# Patient Record
Sex: Female | Born: 1960 | Race: White | Hispanic: No | Smoking: Former smoker
Health system: Southern US, Community
[De-identification: ages and names within clinical notes are randomized; demographics above are authoritative.]

## PROBLEM LIST (undated history)

## (undated) DIAGNOSIS — Z9889 Other specified postprocedural states: Secondary | ICD-10-CM

## (undated) DIAGNOSIS — M51379 Other intervertebral disc degeneration, lumbosacral region without mention of lumbar back pain or lower extremity pain: Secondary | ICD-10-CM

## (undated) DIAGNOSIS — F329 Major depressive disorder, single episode, unspecified: Secondary | ICD-10-CM

## (undated) DIAGNOSIS — N3281 Overactive bladder: Secondary | ICD-10-CM

## (undated) DIAGNOSIS — F32A Depression, unspecified: Secondary | ICD-10-CM

## (undated) DIAGNOSIS — M109 Gout, unspecified: Secondary | ICD-10-CM

## (undated) DIAGNOSIS — F319 Bipolar disorder, unspecified: Secondary | ICD-10-CM

## (undated) DIAGNOSIS — R87619 Unspecified abnormal cytological findings in specimens from cervix uteri: Secondary | ICD-10-CM

## (undated) DIAGNOSIS — T7840XA Allergy, unspecified, initial encounter: Secondary | ICD-10-CM

## (undated) DIAGNOSIS — E785 Hyperlipidemia, unspecified: Secondary | ICD-10-CM

## (undated) DIAGNOSIS — H269 Unspecified cataract: Secondary | ICD-10-CM

## (undated) DIAGNOSIS — R112 Nausea with vomiting, unspecified: Secondary | ICD-10-CM

## (undated) DIAGNOSIS — M5137 Other intervertebral disc degeneration, lumbosacral region: Secondary | ICD-10-CM

## (undated) DIAGNOSIS — T8859XA Other complications of anesthesia, initial encounter: Secondary | ICD-10-CM

## (undated) DIAGNOSIS — I10 Essential (primary) hypertension: Secondary | ICD-10-CM

## (undated) DIAGNOSIS — F419 Anxiety disorder, unspecified: Secondary | ICD-10-CM

## (undated) DIAGNOSIS — K219 Gastro-esophageal reflux disease without esophagitis: Secondary | ICD-10-CM

## (undated) DIAGNOSIS — N951 Menopausal and female climacteric states: Secondary | ICD-10-CM

## (undated) HISTORY — DX: Hyperlipidemia, unspecified: E78.5

## (undated) HISTORY — DX: Major depressive disorder, single episode, unspecified: F32.9

## (undated) HISTORY — DX: Allergy, unspecified, initial encounter: T78.40XA

## (undated) HISTORY — DX: Unspecified abnormal cytological findings in specimens from cervix uteri: R87.619

## (undated) HISTORY — DX: Unspecified cataract: H26.9

## (undated) HISTORY — DX: Other intervertebral disc degeneration, lumbosacral region without mention of lumbar back pain or lower extremity pain: M51.379

## (undated) HISTORY — PX: CHOLECYSTECTOMY: SHX55

## (undated) HISTORY — PX: TUBAL LIGATION: SHX77

## (undated) HISTORY — DX: Anxiety disorder, unspecified: F41.9

## (undated) HISTORY — DX: Other intervertebral disc degeneration, lumbosacral region: M51.37

## (undated) HISTORY — PX: CARPAL TUNNEL RELEASE: SHX101

## (undated) HISTORY — DX: Depression, unspecified: F32.A

## (undated) HISTORY — PX: EYE SURGERY: SHX253

## (undated) HISTORY — PX: DILATION AND CURETTAGE OF UTERUS: SHX78

## (undated) HISTORY — PX: TONSILLECTOMY: SUR1361

## (undated) HISTORY — DX: Overactive bladder: N32.81

## (undated) HISTORY — DX: Gout, unspecified: M10.9

---

## 1983-01-25 HISTORY — PX: CERVIX LESION DESTRUCTION: SHX591

## 2007-06-08 LAB — CBC WITH AUTOMATED DIFF
ABS. BASOPHILS: 0 10*3/uL (ref 0.0–0.1)
ABS. EOSINOPHILS: 0.2 10*3/uL (ref 0.0–0.4)
ABS. LYMPHOCYTES: 3.2 10*3/uL (ref 0.8–3.5)
ABS. MONOCYTES: 0.5 10*3/uL (ref 0–1.0)
ABS. NEUTROPHILS: 4.2 10*3/uL (ref 1.8–8.0)
BASOPHILS: 0 % (ref 0–1)
EOSINOPHILS: 3 % (ref 0–7)
HCT: 39 % (ref 35.0–47.0)
HGB: 13.1 g/dL (ref 11.5–16.0)
LYMPHOCYTES: 39 % (ref 12–49)
MCH: 29.4 PG (ref 26.0–34.0)
MCHC: 33.6 g/dL (ref 30.0–35.0)
MCV: 87.4 FL (ref 80.0–99.0)
MONOCYTES: 6 % (ref 5–13)
NEUTROPHILS: 52 % (ref 32–75)
PLATELET: 273 10*3/uL (ref 150–400)
RBC: 4.46 M/uL (ref 3.80–5.20)
RDW: 12.5 % (ref 11.5–14.5)
WBC: 8.1 10*3/uL (ref 3.6–11.0)

## 2007-06-08 LAB — METABOLIC PANEL, BASIC
Anion gap: 9 mmol/L (ref 5–15)
BUN/Creatinine ratio: 9 — ABNORMAL LOW (ref 12–20)
BUN: 9 MG/DL (ref 6–20)
CO2: 29 MMOL/L (ref 21–32)
Calcium: 8.9 MG/DL (ref 8.5–10.1)
Chloride: 103 MMOL/L (ref 97–108)
Creatinine: 1 MG/DL (ref 0.6–1.3)
GFR est AA: >60 mL/min/{1.73_m2} (ref >60)
GFR est non-AA: >60 mL/min/{1.73_m2} (ref >60)
Glucose: 89 MG/DL (ref 50–100)
Potassium: 4.6 MMOL/L (ref 3.5–5.1)
Sodium: 141 MMOL/L (ref 136–145)

## 2008-05-13 ENCOUNTER — Ambulatory Visit

## 2008-05-13 NOTE — Progress Notes (Signed)
Labs drawn

## 2008-05-14 LAB — METABOLIC PANEL, COMPREHENSIVE
A-G Ratio: 1 — ABNORMAL LOW (ref 1.1–2.2)
ALT (SGPT): 22 U/L (ref 12–78)
AST (SGOT): 11 U/L — ABNORMAL LOW (ref 15–37)
Albumin: 3.6 g/dL (ref 3.5–5.0)
Alk. phosphatase: 68 U/L (ref 50–136)
Anion gap: 8 mmol/L (ref 5–15)
BUN/Creatinine ratio: 16 (ref 12–20)
BUN: 11 MG/DL (ref 6–20)
Bilirubin, total: 0.3 MG/DL (ref 0.2–1.0)
CO2: 27 MMOL/L (ref 21–32)
Calcium: 8.3 MG/DL — ABNORMAL LOW (ref 8.5–10.1)
Chloride: 103 MMOL/L (ref 97–108)
Creatinine: 0.7 MG/DL (ref 0.6–1.3)
GFR est AA: >60 mL/min/{1.73_m2} (ref >60)
GFR est non-AA: >60 mL/min/{1.73_m2} (ref >60)
Globulin: 3.6 g/dL (ref 2.0–4.0)
Glucose: 77 MG/DL (ref 65–100)
Potassium: 4.4 MMOL/L (ref 3.5–5.1)
Protein, total: 7.2 g/dL (ref 6.4–8.2)
Sodium: 138 MMOL/L (ref 136–145)

## 2008-05-14 LAB — CBC WITH AUTOMATED DIFF
ABS. BASOPHILS: 0 10*3/uL (ref 0.0–0.1)
ABS. EOSINOPHILS: 0.2 10*3/uL (ref 0.0–0.4)
ABS. LYMPHOCYTES: 2.9 10*3/uL (ref 0.8–3.5)
ABS. MONOCYTES: 0.8 10*3/uL (ref 0.0–1.0)
ABS. NEUTROPHILS: 7.4 10*3/uL (ref 1.8–8.0)
BASOPHILS: 0 % (ref 0–1)
EOSINOPHILS: 2 % (ref 0–7)
HCT: 36.7 % (ref 35.0–47.0)
HGB: 12.5 g/dL (ref 11.5–16.0)
LYMPHOCYTES: 26 % (ref 12–49)
MCH: 30.1 PG (ref 26.0–34.0)
MCHC: 34.1 g/dL (ref 30.0–36.5)
MCV: 88.4 FL (ref 80.0–99.0)
MONOCYTES: 7 % (ref 5–13)
NEUTROPHILS: 65 % (ref 32–75)
PLATELET: 265 10*3/uL (ref 150–400)
RBC: 4.15 M/uL (ref 3.80–5.20)
RDW: 12.8 % (ref 11.5–14.5)
WBC: 11.4 10*3/uL — ABNORMAL HIGH (ref 3.6–11.0)

## 2008-05-14 LAB — LIPID PANEL
CHOL/HDL Ratio: 3.5 (ref 0–5.0)
Cholesterol, total: 187 MG/DL (ref <200)
HDL Cholesterol: 53 MG/DL
LDL, calculated: 86.2 MG/DL (ref 0–100)
Triglyceride: 239 MG/DL
VLDL, calculated: 47.8 MG/DL

## 2008-05-14 LAB — FOLLICLE STIMULATING HORMONE: FSH: 2.9 m[IU]/mL

## 2008-05-14 LAB — TSH 3RD GENERATION: TSH: 1.4 u[IU]/mL (ref 0.36–3.74)

## 2008-05-15 LAB — ESTRADIOL: ESTRADIOL: 155 pg/mL

## 2008-05-16 LAB — ESTROGENS, FRACTIONATED
ESTRADIOL: 170 pg/mL
ESTRONE: 41.1 pg/mL
TOTAL ESTROGENS: 211.1 pg/mL

## 2008-05-27 MED ORDER — CYCLOBENZAPRINE 10 MG TAB
10 mg | ORAL_TABLET | Freq: Two times a day (BID) | ORAL | Status: AC
Start: 2008-05-27 — End: 2008-06-06

## 2008-05-27 MED ORDER — HYDROCODONE 5 MG-ACETAMINOPHEN 500 MG CAPSULE
5-500 mg | ORAL_TABLET | Freq: Four times a day (QID) | ORAL | Status: AC
Start: 2008-05-27 — End: 2008-06-01

## 2008-05-27 NOTE — Progress Notes (Signed)
HISTORY OF PRESENT ILLNESS  Pamela Harrison is a 48 y.o. female.  HPI  Larey Seat off ladder, injuring right posterior thorax 5 days ago. No sob or fever. Hurts to cough. Stopped smoking because of thorax pain. No Crack heard, and pain came delayed, but still need to R/O rib fracture. Large bruise.      Review of Systems   Constitutional: Negative.    HENT: Negative.    Eyes: Negative.    Cardiovascular: Negative.    Gastrointestinal: Negative.    Genitourinary: Negative.    Musculoskeletal: Positive for myalgias and back pain.   Skin: Negative.    Neurological: Negative.    Psychiatric/Behavioral: Negative.        Physical Exam   Constitutional: She is oriented. She appears well-developed and well-nourished.   HENT:   Head: Normocephalic and atraumatic.   Eyes: Conjunctivae and extraocular motions are normal. Pupils are equal, round, and reactive to light.   Neck: Normal range of motion. Neck supple. No tracheal deviation present. No thyromegaly present.   Cardiovascular: Normal rate, regular rhythm, normal heart sounds and intact distal pulses.  Exam reveals no gallop and no friction rub.    No murmur heard.  Pulmonary/Chest: No respiratory distress. She has no wheezes. She has no rales. She exhibits tenderness.        Large bruise right posterior thorax about T7. No palpable FX. or rub heard   Abdominal: Bowel sounds are normal.   Neurological: She is alert and oriented. She has normal reflexes.   Skin: Skin is warm.            ASSESSMENT and PLAN  Constancia was seen today for back pain and spasms.    Diagnoses and associated orders for this visit:    Superf injury trunk nec  - HYDROCODONE-ACETAMINOPHEN 5 MG-500 MG CAP; Take 1 Tab by mouth four (4) times daily for 5 days.  - cyclobenzaprine (FLEXERIL) tablet; Take 1 Tab by mouth two (2) times a day for 10 days.  - MAM CHEST PA AND LATERAL; Future    Other Orders  - TRAZODONE 150 MG TAB; Take 150 mg by mouth nightly.        Follow-up Disposition:   Return if symptoms worsen or fail to improve.  Warned regarding pulmonary infections.

## 2008-06-30 MED ORDER — FAMOTIDINE 20 MG TAB
20 mg | ORAL_TABLET | Freq: Two times a day (BID) | ORAL | Status: DC
Start: 2008-06-30 — End: 2012-08-07

## 2008-06-30 NOTE — Progress Notes (Signed)
HISTORY OF PRESENT ILLNESS  Pamela Harrison is a 48 y.o. female.  HPI  Hypertension:  The patient reports:  taking medications as instructed, no medication side effects noted, no TIA's, no chest pain on exertion, no dyspnea on exertion, no swelling of ankles, no orthostatic dizziness or lightheadedness, no orthopnea or paroxysmal nocturnal dyspnea.     Lab Results   Component Value Date/Time   ??? Sodium 138 05/13/2008  3:36 PM   ??? Potassium 4.4 05/13/2008  3:36 PM   ??? Chloride 103 05/13/2008  3:36 PM   ??? CO2 27 05/13/2008  3:36 PM   ??? Anion gap 8 05/13/2008  3:36 PM   ??? Glucose 77 05/13/2008  3:36 PM   ??? BUN 11 05/13/2008  3:36 PM   ??? Creatinine 0.7 05/13/2008  3:36 PM   ??? BUN/Creatinine ratio 16 05/13/2008  3:36 PM   ??? GFR est non-AA >60 05/13/2008  3:36 PM   ??? Calcium 8.3 05/13/2008  3:36 PM   ??? GFR est AA >60 05/13/2008  3:36 PM       GERD: Needs rf of Famotidine which controls her symptoms.    Bipolar disorder: Needs labs for high risk medication monitoring.      Review of Systems   Constitutional: Negative.    HENT: Negative.    Eyes: Negative.    Respiratory: Negative.    Cardiovascular: Negative.    Gastrointestinal: Negative.    Genitourinary: Negative.    Skin: Negative.    Neurological: Negative.    Endo/Heme/Allergies: Negative.    Psychiatric/Behavioral: Negative.        Physical Exam   Nursing note and vitals reviewed.  Constitutional: She is oriented. She appears well-developed and well-nourished. No distress.   HENT:   Head: Normocephalic and atraumatic.   Right Ear: External ear normal.   Left Ear: External ear normal.   Eyes: Conjunctivae are normal. Pupils are equal, round, and reactive to light. No scleral icterus.   Neck: Normal range of motion. Neck supple. No JVD present. No tracheal deviation present. No thyromegaly present.        No carotid bruit.   Cardiovascular: Normal rate, regular rhythm and normal heart sounds.  Exam reveals no gallop and no friction rub.    No murmur heard.   Pulmonary/Chest: Effort normal and breath sounds normal. She has no wheezes. She has no rales.   Abdominal: Soft. Bowel sounds are normal. She exhibits no distension. No tenderness. She has no rebound and no guarding.   Musculoskeletal: She exhibits no edema.   Neurological: She is alert and oriented.   Skin: Skin is warm and dry. No rash noted. She is not diaphoretic.   Psychiatric: She has a normal mood and affect. Her behavior is normal. Judgment and thought content normal.       ASSESSMENT and PLAN  Pamela Harrison was seen today for follow-up.    Diagnoses and associated orders for this visit:    Hypertension  - METABOLIC PANEL, COMPREHENSIVE; Future    Gerd (gastroesophageal reflux disease)  - CBC WITH AUTOMATED DIFF; Future  - famotidine (PEPCID) tablet; Take 1 Tab by mouth two (2) times a day.    Bipolar depression  - CBC WITH AUTOMATED DIFF; Future  - METABOLIC PANEL, COMPREHENSIVE; Future  - CARBAMAZEPINE; Future        Follow-up Disposition:  Return in about 3 months (around 09/30/2008).

## 2008-07-01 ENCOUNTER — Ambulatory Visit

## 2008-07-01 LAB — CARBAMAZEPINE: Carbamazepine: 5.7 ug/mL (ref 4.0–12.0)

## 2008-07-01 LAB — CBC WITH AUTOMATED DIFF
ABS. BASOPHILS: 0 10*3/uL (ref 0.0–0.1)
ABS. EOSINOPHILS: 0.2 10*3/uL (ref 0.0–0.4)
ABS. LYMPHOCYTES: 2.8 10*3/uL (ref 0.8–3.5)
ABS. MONOCYTES: 0.7 10*3/uL (ref 0.0–1.0)
ABS. NEUTROPHILS: 8.2 10*3/uL — ABNORMAL HIGH (ref 1.8–8.0)
BASOPHILS: 0 % (ref 0–1)
EOSINOPHILS: 2 % (ref 0–7)
HCT: 34.4 % — ABNORMAL LOW (ref 35.0–47.0)
HGB: 11.4 g/dL — ABNORMAL LOW (ref 11.5–16.0)
LYMPHOCYTES: 23 % (ref 12–49)
MCH: 29.8 PG (ref 26.0–34.0)
MCHC: 33.1 g/dL (ref 30.0–36.5)
MCV: 89.8 FL (ref 80.0–99.0)
MONOCYTES: 6 % (ref 5–13)
NEUTROPHILS: 69 % (ref 32–75)
PLATELET: 333 10*3/uL (ref 150–400)
RBC: 3.83 M/uL (ref 3.80–5.20)
RDW: 13.1 % (ref 11.5–14.5)
WBC: 11.8 10*3/uL — ABNORMAL HIGH (ref 3.6–11.0)

## 2008-07-01 LAB — METABOLIC PANEL, COMPREHENSIVE
A-G Ratio: 1 — ABNORMAL LOW (ref 1.1–2.2)
ALT (SGPT): 15 U/L (ref 12–78)
AST (SGOT): 9 U/L — ABNORMAL LOW (ref 15–37)
Albumin: 3.8 g/dL (ref 3.5–5.0)
Alk. phosphatase: 119 U/L (ref 50–136)
Anion gap: 6 mmol/L (ref 5–15)
BUN/Creatinine ratio: 14 (ref 12–20)
BUN: 11 MG/DL (ref 6–20)
Bilirubin, total: 0.1 MG/DL — ABNORMAL LOW (ref 0.2–1.0)
CO2: 29 MMOL/L (ref 21–32)
Calcium: 8.7 MG/DL (ref 8.5–10.1)
Chloride: 101 MMOL/L (ref 97–108)
Creatinine: 0.8 mg/dL (ref 0.6–1.3)
GFR est AA: >60 mL/min/{1.73_m2} (ref >60)
GFR est non-AA: >60 mL/min/{1.73_m2} (ref >60)
Globulin: 3.7 g/dL (ref 2.0–4.0)
Glucose: 78 MG/DL (ref 65–100)
Potassium: 5.1 MMOL/L (ref 3.5–5.1)
Protein, total: 7.5 g/dL (ref 6.4–8.2)
Sodium: 136 MMOL/L (ref 136–145)

## 2008-07-02 LAB — FERRITIN: Ferritin: 6 NG/ML — ABNORMAL LOW (ref 8–252)

## 2008-10-17 MED ORDER — PROMETHAZINE-CODEINE 6.25 MG-10 MG/5 ML SYRUP
Freq: Four times a day (QID) | ORAL | Status: DC | PRN
Start: 2008-10-17 — End: 2012-08-02

## 2008-10-17 NOTE — Patient Instructions (Signed)
English   Spanish  Upper Respiratory Infection in Adults: After Your Visit  Your Care Instructions    An upper respiratory infection, also called a URI, is an infection of the nose, sinuses, or throat. Viruses or bacteria can cause URIs. Colds, the flu, and sinusitis are examples of URIs. These infections are spread by coughs, sneezes, and close contact.  You may need antibiotics to treat bacterial infections. Antibiotics do not help viral infections. But you can treat most infections with home care. This may include drinking lots of fluids and taking over-the-counter pain medicine. You will probably feel better in 4 to 10 days.  Follow-up care is a key part of your treatment and safety. Be sure to make and go to all appointments, and call your doctor if you are having problems. It???s also a good idea to know your test results and keep a list of the medicines you take.  How can you care for yourself at home?  ?? Drink plenty of fluids to prevent dehydration. Stick to water and other caffeine-free clear liquids until you feel better. If you have kidney, heart, or liver disease and have to limit fluids, talk with your doctor before you increase the amount of fluids you drink.   ?? Take an over-the-counter pain medicine, such as acetaminophen (Tylenol), ibuprofen (Advil, Motrin), or naproxen (Aleve). Read and follow all instructions on the label.   ?? If your doctor prescribed antibiotics, take them as directed. Do not stop taking them just because you feel better. You need to take the full course of antibiotics.   ?? Ask your doctor about cough medicines and decongestants. Some doctors recommend these medicines, while others feel that they do not help. If you use an over-the-counter medicine, treat a symptom with a medicine for only that problem. For example, if you have a cough, use a cough medicine???not a medicine for cough, stuffy nose, and headache.    ?? Be careful when taking over-the-counter cold or flu medicines and Tylenol at the same time. Many of these medicines have acetaminophen, which is Tylenol. Read the labels to make sure that you are not taking more than the recommended dose. Too much acetaminophen (Tylenol) can be harmful.   ?? Get plenty of rest.   ?? Use saline (saltwater) nasal washes to help keep your nasal passages open and wash out mucus and bacteria. You can buy saline nose drops at a grocery store or drugstore. Or you can make your own at home by mixing ?? teaspoon salt, 1 cup water (at room temperature) and ?? teaspoon baking soda. If you make your own, fill a bulb syringe with the solution, insert the tip into your nostril, and squeeze gently. Blow your nose.   ?? Use a vaporizer or humidifier to add moisture to your bedroom. Follow the instructions for cleaning the machine.   ?? Do not smoke. Avoid secondhand smoke. If you need help quitting, talk to your doctor about stop-smoking programs and medicines. These can increase your chances of quitting for good.  When should you call for help?  Call 911 anytime you think you may need emergency care. For example, call if:  ?? You have severe trouble breathing.   ?? You have rapid swelling of the throat or tongue.  Call your doctor now or seek immediate medical care if:  ?? You have a fever with a stiff neck or a severe headache.   ?? You have signs of needing more fluids. You have sunken   eyes, a dry mouth, and pass only a little dark urine.   ?? You cannot keep down fluids or medicine.  Watch closely for changes in your health, and be sure to contact your doctor if:  ?? You have a deep cough and a lot of mucus.   ?? You are too tired to eat or drink.   ?? You have a new symptom, such as a sore throat, an earache, or a rash.   ?? You do not get better as expected.     Where can you learn more?   Go to http://www.healthwise.net/[facility].   Enter K520 in the search box to learn more about "Upper Respiratory Infection in Adults: After Your Visit."   ?? 2006-2010 Healthwise[employer]. Care instructions adapted under license by [employer] (which disclaims liability or warranty for this information). This care instruction is for use with your licensed healthcare professional. If you have questions about a medical condition or this instruction, always ask your healthcare professional. Healthwise disclaims any warranty or liability for your use of this information.

## 2008-10-17 NOTE — Progress Notes (Signed)
Subjective:   Pamela Harrison is a 48 y.o. female who complains of congestion, productive cough and chills for 7 days, gradually worsening since that time.  She denies a history of shortness of breath and wheezing.    Evaluation to date: none.   Treatment to date: OTC products.  Patient does smoke cigarettes.  Relevant PMH: No pertinent additional PMH.    Problem List Date Reviewed: 10/17/2008      Class Noted    Hypertension [401.9AJ]  Unknown        Anemia [285.9AA]  Unknown    Overview     by history          Bipolar depression [296.50H] (Chronic)  06/29/2008              Current outpatient prescriptions   Medication Sig Dispense Refill   ??? ferrous sulfate 325 mg (65 mg Iron) tablet Take  by mouth daily (before breakfast).       ??? famotidine (PEPCID) 20 mg tablet Take 1 Tab by mouth two (2) times a day.  60 Tab  11   ??? trazodone (DESYREL) 150 mg tablet Take 150 mg by mouth nightly.       ??? citalopram (CELEXA) 20 mg tablet Take 20 mg by mouth daily.       ??? potassium 99 mg Tab Take 99 mg by mouth daily.       ??? hydrOXYzine (ATARAX) 25 mg tablet Take 25 mg by mouth as needed.       ??? lisinopril-hydrochlorothiazide (PRINZIDE, ZESTORETIC) 10-12.5 mg per tablet Take 1 Tab by mouth daily.       ??? carbamazepine (EPITOL) 200 mg tablet Take 200 mg by mouth two (2) times a day.           Allergies   Allergen Reactions   ??? Sulfa (Sulfonamide Antibiotics) Nausea and Vomiting       Past Medical History   Diagnosis Date   ??? Hypertension    ??? Menopause    ??? BIPOLAR DISORDER    ??? Anemia      by history   ??? Depression    ??? Bipolar depression 06/29/2008       Past Surgical History   Procedure Date   ??? Hx cholecystectomy 1988   ??? Hx tubal ligation 1995   ??? Hx carpal tunnel release 2000     bilateral       Family History   Problem Relation Age of Onset   ??? Hypertension Father    ??? Hypertension Paternal Grandmother        History   Substance Use Topics   ??? Tobacco Use:    ??? Alcohol Use:           Review of Systems   Pertinent items are noted in HPI.    Objective:     BP 103/68   Pulse 73   Temp(Src) 97.4 ??F (36.3 ??C) (Oral)   Resp 20   Ht 5\' 6"  (1.676 m)   Wt 164 lb (74.39 kg)  General:  alert, cooperative, no distress   Eyes: conjunctivae/corneas clear. PERRL, EOM's intact. Fundi benign   Ears: normal TM's and external ear canals AU   Sinuses: Normal paranasal sinuses without tenderness   Mouth:  Lips, mucosa, and tongue normal. Teeth and gums normal   Neck: supple, symmetrical, trachea midline and no adenopathy.   Heart: S1 and S2 normal, no murmurs noted.    Lungs: clear to auscultation  bilaterally   Abdomen: abnormal findings:  hyperactive bowel sounds        Assessment/Plan:   viral upper respiratory illness  Suggested symptomatic OTC remedies.  RTC prn.  Discussed diagnosis and treatment of viral URIs.  Discussed the importance of avoiding unnecessary antibiotic therapy.

## 2008-11-18 MED ORDER — KETOROLAC TROMETHAMINE 30 MG/ML INJECTION
30 mg/mL (1 mL) | Freq: Once | INTRAMUSCULAR | Status: AC
Start: 2008-11-18 — End: 2008-11-18

## 2008-11-18 MED ORDER — KETOROLAC TROMETHAMINE 10 MG TAB
10 mg | ORAL_TABLET | ORAL | Status: AC | PRN
Start: 2008-11-18 — End: 2008-11-23

## 2008-11-18 NOTE — Progress Notes (Signed)
Subjective  Pamela Harrison is a 48 y.o. female who presents [id number] for the past 3 days.  Frontal, top of head and more on the right side than the left. Intermittent pain worsens and remits but has not completely gone away for the past 3 days.  Photophopia and noise make it worse.  + occasional nausea and congestion.  Tried tylenol, no help.  Pt had similar in the past.   No change in vision or smell, no dizziness or stumbling.  No LOC, confusion or weakness.    Hypertension - well controlled on lisinpril/hctz.      Allergies   Allergen Reactions   ??? Sulfa (Sulfonamide Antibiotics) Nausea and Vomiting       Medications::has a current medication list which includes ferrous sulfate, famotidine, trazodone, citalopram, potassium, hydroxyzine, lisinopril-hydrochlorothiazide, carbamazepine.  Past Medical History:has a past medical history of Hypertension; Menopause; BIPOLAR DISORDER; Anemia; Depression; and Bipolar depression (06/29/2008).  Past Surgical History: has past surgical history that includes cholecystectomy (1988); tubal ligation (1995); and carpal tunnel release (2000).  Social History: reports that she has been using tobacco.  She reports that she does not currently drink alcohol.    ROS   General: negative for - chills, fatigue, fever, weight change  Psych: negative for - anxiety, depression, irritability or mood swings  ENT: negative for - +headaches, hearing change, nasal congestion, oral lesions, +sneezing or sore throat  Heme/ Lymph: negative for - bleeding problems, bruising, pallor or swollen lymph nodes  Endo: negative for - hot flashes, polydipsia/polyuria or temperature intolerance  Resp: negative for - cough, shortness of breath or wheezing  CV: negative for - chest pain, edema or palpitations  GI: negative for - abdominal pain, change in bowel habits, constipation, diarrhea or nausea/vomiting  GU: negative for - dysuria, hematuria, incontinence, pelvic pain or vulvar/vaginal symptoms   MSK: negative for - joint pain, joint swelling or muscle pain  Neuro: negative for - confusion, headaches, seizures or weakness  Derm: negative for - dry skin, hair changes, rash or skin lesion changes      Physical Exam  BP 122/84   Pulse 72   Temp(Src) 97.6 ??F (36.4 ??C) (Oral)   Resp 16   Ht 5\' 7"  (1.702 m)   Wt 168 lb (76.204 kg)   LMP 11/04/2008  General appearance: alert, cooperative, no distress, appears stated age  Head: Normocephalic, without obvious abnormality, atraumatic  Eyes: conjunctivae/corneas clear. PERRL, EOM's intact.   Ears: normal TM's and external ear canals AU  Nose: Nares normal. Septum midline. Mucosa normal. No drainage or sinus tenderness.  Throat: Lips, mucosa, and tongue normal. Teeth and gums normal  Neck: supple, symmetrical, trachea midline, no adenopathy, thyroid: not enlarged, symmetric, no tenderness/mass/nodules, no carotid bruit and no JVD  Back: symmetric, no curvature. ROM normal. No CVA tenderness.  Lungs: clear to auscultation bilaterally, no wheezes, no increased work of breathing  Heart: regular rate and rhythm, S1, S2 normal, no murmur, click, rub or gallop  Extremities: extremities normal, atraumatic, no cyanosis or edema  Pulses: 2+ and symmetric  Skin: Skin color, texture, turgor normal. No rashes or lesions  Lymph nodes: Cervical, supraclavicular, and axillary nodes normal.  Neurologic: Alert and oriented X 3, normal strength and tone. Normal symmetric reflexes. Normal coordination and gait; CN 2-12 grossly intact      Assessment/Plan  1. Headache (784.0)  KETOROLAC TROMETHAMINE INJ, THER/PROPH/DIAG INJECTION, SUBCUT/IM     likely migraine -  Improved with 30mg  toradol injection in  the office  10mg  toradol script given  If patient is not completely better by 2pm this afternoon, she will call us back and we will order ct scan at that time.

## 2008-11-18 NOTE — Progress Notes (Signed)
Pamela Harrison is a 48 y.o. female who had a chief complaint of Headache.

## 2008-12-02 NOTE — Progress Notes (Addendum)
Addended by: Raj Janus on: 12/02/2008      Modules accepted: Orders

## 2008-12-02 NOTE — Progress Notes (Signed)
Pamela Harrison is a 48 y.o. female who had no chief complaint listed for this encounter.

## 2008-12-02 NOTE — Progress Notes (Signed)
Pamela Harrison is a 48 y.o. female  Presenting for her annual checkup    LMP:11/04/08  Menses:regular monthly  [id number] - SVD  Last pelvic/PAP: doesn't know - many years  Last mammogram: hasn't had one  Last screening colonoscopy: hasn't had one  Pt requesting STD testing    Current outpatient prescriptions   Medication Sig Dispense Refill   ??? zolpidem (AMBIEN) 10 mg tablet Take  by mouth nightly as needed.       ??? ferrous sulfate 325 mg (65 mg Iron) tablet Take  by mouth daily (before breakfast).       ??? famotidine (PEPCID) 20 mg tablet Take 1 Tab by mouth two (2) times a day.  60 Tab  11   ??? citalopram (CELEXA) 20 mg tablet Take 20 mg by mouth daily.       ??? potassium 99 mg Tab Take 99 mg by mouth daily.       ??? hydrOXYzine (ATARAX) 25 mg tablet Take 25 mg by mouth as needed.       ??? lisinopril-hydrochlorothiazide (PRINZIDE, ZESTORETIC) 10-12.5 mg per tablet Take 1 Tab by mouth daily.       ??? carbamazepine (EPITOL) 200 mg tablet Take 200 mg by mouth two (2) times a day.       ??? promethazine-codeine (PHENERGAN-CODEINE) 6.25-10 mg/5 mL syrup Take 5-10 mL by mouth four (4) times daily as needed for Cough.  200 mL  1   ??? trazodone (DESYREL) 150 mg tablet Take 150 mg by mouth nightly.           Allergies: Sulfa (sulfonamide antibiotics)   History   Social History   ??? Marital Status: Divorced     Spouse Name: N/A     Number of Children: N/A   ??? Years of Education: N/A   Occupational History   ??? Not on file.   Social History Main Topics   ??? Tobacco Use: Yes   ??? Alcohol Use: No   ??? Drug Use:    ??? Sexually Active:    Other Topics Concern   ??? Not on file   Social History Narrative   ??? No narrative on file       Family History   Problem Relation Age of Onset   ??? Hypertension Father    ??? Hypertension Paternal Grandmother        Past Medical History   Diagnosis Date   ??? Hypertension    ??? Menopause    ??? BIPOLAR DISORDER    ??? Anemia      by history   ??? Depression    ??? Bipolar depression 06/29/2008          Review of Systems - History obtained from the patient  Gen: negative for weight loss, fever, night sweats  HEENT: negative for hearing loss, earache, congestion, snoring, sorethroat  CV: negative for chest pain, palpitations, edema  Resp: negative for cough, shortness of breath, wheezing  GI: negative for change in bowel habits, abdominal pain, black or bloody stools  GU: negative for frequency, dysuria, hematuria, vaginal discharge  MSK: negative for back pain, joint pain, muscle pain  Breast: negative for breast lumps, nipple discharge, galactorrhea  Skin :negative for itching, rash, hives  Neuro: negative for dizziness, headache, confusion, weakness  Psych: negative for anxiety, depression, change in mood  Heme/lymph: negative for bleeding, bruising, pallor    Physical Exam    BP 118/80   Pulse 82   Temp(Src) 98.1 ??  F (36.7 ??C) (Oral)   Resp 16   Ht 5\' 7"  (1.702 m)   Wt 166 lb (75.297 kg)   LMP 11/04/2008  General appearance: alert, cooperative, no distress, appears stated age  Head: Normocephalic, without obvious abnormality, atraumatic, sinuses nontender to percussion  Eyes: conjunctivae/corneas clear. PERRL, EOM's intact.   Ears: normal TM's and external ear canals AU  Nose: Nares normal. Septum midline. Mucosa normal. No drainage or sinus tenderness.  Throat: Lips, mucosa, and tongue normal. Teeth and gums normal  Neck: supple, symmetrical, trachea midline, no adenopathy, thyroid: not enlarged, symmetric, no tenderness/mass/nodules, no carotid bruit and no JVD  Back: symmetric, no curvature. ROM normal. No CVA tenderness.  Lungs: clear to auscultation bilaterally, no wheezes, no increased work of breathing  Heart: regular rate and rhythm, S1, S2 normal, no murmur, click, rub or gallop  Abdomen: soft, non-tender. Bowel sounds normal. No masses,  no organomegaly  Extremities: extremities normal, atraumatic, no cyanosis or edema  Pulses: 2+ and symmetric   Skin: Skin color, texture, turgor normal. No rashes or lesions  Lymph nodes: Cervical, supraclavicular, and axillary nodes normal.  Neurologic: Alert and oriented X 3, normal strength and tone. Normal symmetric reflexes. Normal coordination and gait  Pelvic: ext genital nl without rashes or lesions, pink and moist vaginal mucosa, scant white discharge, cervix without lesions or abnormal discharge, uterus non tender and normal size, no adnexal masses or tenderness, clitoral piercing  Breast: non tender, no masses or tenderness, no nipple discharge, nipple peircing bilaterally      Assessment and Plan:    1. Well woman - physical / health maintenance visit   Pap smear today   Counseled re: diet, exercise, healthy lifestyle   Return for yearly wellness visits   Mammogram ordered   Referral to gi for colonoscopy  2.   STD screening - GC/Chl swab, HIV, RPR, Hep B, Hep C, HSV 1 and 2  F/u for results  HTN - will check flp, cmp

## 2008-12-03 LAB — LIPID PANEL
CHOL/HDL Ratio: 4.3 (ref 0–5.0)
Cholesterol, total: 210 MG/DL — ABNORMAL HIGH (ref <200)
HDL Cholesterol: 49 MG/DL
LDL, calculated: 132.8 MG/DL — ABNORMAL HIGH (ref 0–100)
Triglyceride: 141 MG/DL (ref <150)
VLDL, calculated: 28.2 MG/DL

## 2008-12-03 LAB — METABOLIC PANEL, COMPREHENSIVE
A-G Ratio: 1.2 (ref 1.1–2.2)
ALT (SGPT): 34 U/L (ref 12–78)
AST (SGOT): 18 U/L (ref 15–37)
Albumin: 4.2 g/dL (ref 3.5–5.0)
Alk. phosphatase: 73 U/L (ref 50–136)
Anion gap: 13 mmol/L (ref 5–15)
BUN/Creatinine ratio: 23 — ABNORMAL HIGH (ref 12–20)
BUN: 16 MG/DL (ref 6–20)
Bilirubin, total: 0.3 MG/DL (ref 0.2–1.0)
CO2: 26 MMOL/L (ref 21–32)
Calcium: 8.7 MG/DL (ref 8.5–10.1)
Chloride: 95 MMOL/L — ABNORMAL LOW (ref 97–108)
Creatinine: 0.7 MG/DL (ref 0.6–1.3)
GFR est AA: >60 mL/min/{1.73_m2} (ref >60)
GFR est non-AA: >60 mL/min/{1.73_m2} (ref >60)
Globulin: 3.6 g/dL (ref 2.0–4.0)
Glucose: 88 MG/DL (ref 65–100)
Potassium: 4.3 MMOL/L (ref 3.5–5.1)
Protein, total: 7.8 g/dL (ref 6.4–8.2)
Sodium: 134 MMOL/L — ABNORMAL LOW (ref 136–145)

## 2008-12-03 LAB — CARBAMAZEPINE: Carbamazepine: 9.9 ug/mL (ref 4.0–12.0)

## 2008-12-03 LAB — RPR
RPR: NONREACTIVE
RPR: NONREACTIVE

## 2008-12-03 NOTE — Telephone Encounter (Signed)
Info faxed to [facility] [phone] to make appt with pt office ph [phone]

## 2008-12-04 LAB — HEPATITIS C AB
Hep C virus Ab Interp.: NEGATIVE
Hepatitis C virus Ab: <0.02 Index

## 2008-12-04 LAB — HIV 1/2 AB SCREEN W RFLX CONFIRM
HIV 1/2 Interpretation: NONREACTIVE
HIV1/2 INTERPRETATION, HHIVI: NONREACTIVE

## 2008-12-04 LAB — HEP B SURFACE AG: Hep B surface Ag: NEGATIVE

## 2008-12-04 LAB — HEPATITIS C ANTIBODY
HCV Ab: <0.02 Index
Hepatitis C Ab: NEGATIVE

## 2008-12-05 LAB — HSV TYPE 2-SPECIFIC ABS, IGG W/REFL SUPPLEMENTAL TESTING: HSV-2 Glycoprotein, IgG: 8.5 IV

## 2008-12-05 LAB — CHLAMYDIA/GC PCR
Chlamydia amplified: NEGATIVE
N. gonorrhea, amplified: NEGATIVE

## 2008-12-05 LAB — HSV-1 AB, IGG GLYCOPROTEIN, G-SPECIFIC: HSV-1 Glycoprotein, IgG: 5.38 IV

## 2008-12-09 NOTE — Telephone Encounter (Signed)
Message copied by Serita Butcher on Tue Dec 09, 2008 12:50 PM  ------       Message from: Raj Janus       Created: Mon Dec 08, 2008  8:22 AM         Pt needs appt to discuss results       ----- Message -----          From: [facility]          Sent: 12/03/2008  12:04 PM            To: Bernita Buffy, MD

## 2008-12-09 NOTE — Progress Notes (Signed)
Subjective  Pamela Harrison is a 48 y.o. female who presents for:f/u of lab results  Hyperlipidemia - Pt not on medication.  No chest pain, no sob, no muscle aches.  Working on low cholesterol, low fat diet.    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  She denies any genital lesions, has in the past had oral cold sores.    Allergies   Allergen Reactions   ??? Sulfa (Sulfonamide Antibiotics) Nausea and Vomiting       Medications::has a current medication list which includes zolpidem, ferrous sulfate, famotidine, potassium, hydroxyzine, lisinopril-hydrochlorothiazide, carbamazepine, promethazine-codeine, trazodone, and citalopram.  Past Medical History:has a past medical history of Hypertension; Menopause; BIPOLAR DISORDER; Anemia; Depression; and Bipolar depression (06/29/2008).  Past Surgical History: has past surgical history that includes cholecystectomy (1988); tubal ligation (1995); and carpal tunnel release (2000).  Social History: reports that she has been using tobacco.  She reports that she does not currently drink alcohol.    ROS   General: negative for - chills, fatigue, fever, weight change  Psych: negative for - anxiety, depression, irritability or mood swings  ENT: negative for - headaches, hearing change, nasal congestion, oral lesions, sneezing or sore throat  Heme/ Lymph: negative for - bleeding problems, bruising, pallor or swollen lymph nodes  Endo: negative for - hot flashes, polydipsia/polyuria or temperature intolerance  Resp: negative for - cough, shortness of breath or wheezing  CV: negative for - chest pain, edema or palpitations  GI: negative for - abdominal pain, change in bowel habits, constipation, diarrhea or nausea/vomiting  GU: negative for - dysuria, hematuria, incontinence, pelvic pain or vulvar/vaginal symptoms  MSK: negative for - joint pain, joint swelling or muscle pain   Neuro: negative for - confusion, headaches, seizures or weakness  Derm: negative for - dry skin, hair changes, rash or skin lesion changes      Physical Exam  BP 130/82   Pulse 69   Temp(Src) 97.5 ??F (36.4 ??C) (Oral)   Resp 16   Wt 174 lb (78.926 kg)   LMP 11/04/2008  General appearance: alert, cooperative, no distress, appears stated age  Head: Normocephalic, without obvious abnormality, atraumatic  Eyes: conjunctivae/corneas clear. PERRL, EOM's intact.   Lungs: clear to auscultation bilaterally, no wheezes, no increased work of breathing  Heart: regular rate and rhythm, S1, S2 normal, no murmur, click, rub or gallop  Extremities: extremities normal, atraumatic, no cyanosis or edema  Pulses: 2+ and symmetric  Skin: Skin color, texture, turgor normal. No rashes or lesions  Lymph nodes: Cervical, supraclavicular, and axillary nodes normal.  Neurologic: Alert and oriented X 3, normal strength and tone.Normal coordination and gait      Assessment/Plan  1. 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

## 2008-12-09 NOTE — Telephone Encounter (Signed)
Left message informing patient to make appointment to discuss lab results.

## 2008-12-09 NOTE — Progress Notes (Signed)
Pamela Harrison is a 48 y.o. female who had a chief complaint of Results.

## 2008-12-10 NOTE — Telephone Encounter (Signed)
appt 12-19-08 at 12:30 sf med ct for mammogram scheduled by concierge

## 2008-12-23 MED ORDER — KETOROLAC TROMETHAMINE 30 MG/ML INJECTION
30 mg/mL (1 mL) | Freq: Once | INTRAMUSCULAR | Status: AC
Start: 2008-12-23 — End: 2008-12-23

## 2008-12-23 MED ORDER — SUMATRIPTAN 50 MG TAB
50 mg | ORAL_TABLET | Freq: Once | ORAL | Status: DC | PRN
Start: 2008-12-23 — End: 2012-08-07

## 2008-12-23 NOTE — Progress Notes (Signed)
Pamela Harrison is a 48 y.o. female who had a chief complaint of Headache.

## 2008-12-23 NOTE — Progress Notes (Signed)
Subjective  Pamela Harrison is a 48 y.o. female who presents QIO:NGEXBMWU for one week, constant with associated nausea.  Has not tried any medications like tylenol or motrin because she says they don't work and make her sick to her stomach.  Pt has never been on migraine medications.  Light and sound bother her.  Resting makes the headache better.  She gets these about once a month.    She is supposed to wear glasses, but isn't wearing them and knows she needs to go and have her vision checked.  She is under a lot of stress.  Hypertension - well controlled on lisinopril/hctz, but did not take her medicines this AM.  No headaches or dizziness.  Not checking BP at home.    Allergies   Allergen Reactions   ??? Sulfa (Sulfonamide Antibiotics) Nausea and Vomiting       Medications::has a current medication list which includes cholecalciferol (vitamin d3), zolpidem, ferrous sulfate, famotidine, potassium, hydroxyzine, lisinopril-hydrochlorothiazide, carbamazepine, promethazine-codeine, trazodone, and citalopram.  Past Medical History:has a past medical history of Hypertension; Menopause; BIPOLAR DISORDER; Anemia; Depression; and Bipolar depression (06/29/2008).  Past Surgical History: has past surgical history that includes cholecystectomy (1988); tubal ligation (1995); and carpal tunnel release (2000).  Social History: reports that she has been using tobacco.  She reports that she does not currently drink alcohol.    ROS   General: negative for - chills, fatigue, fever, weight change  Psych: negative for - anxiety, depression, irritability or mood swings  ENT: negative for -+ headaches, hearing change, nasal congestion, oral lesions, sneezing or sore throat  Heme/ Lymph: negative for - bleeding problems, bruising, pallor or swollen lymph nodes  Endo: negative for - hot flashes, polydipsia/polyuria or temperature intolerance  Resp: negative for - cough, shortness of breath or wheezing   CV: negative for - chest pain, edema or palpitations  GI: negative for - abdominal pain, change in bowel habits, constipation, diarrhea or nausea/vomiting  GU: negative for - dysuria, hematuria, incontinence, pelvic pain or vulvar/vaginal symptoms  MSK: negative for - joint pain, joint swelling or muscle pain  Neuro: negative for - confusion, headaches, seizures or weakness  Derm: negative for - dry skin, hair changes, rash or skin lesion changes      Physical Exam  BP 154/101   Pulse 79   Temp(Src) 99.3 ??F (37.4 ??C) (Oral)   Resp 18   Wt 174 lb (78.926 kg)  General appearance: alert, cooperative, no distress, appears stated age  Head: Normocephalic, without obvious abnormality, atraumatic  Eyes: conjunctivae/corneas clear. PERRL, EOM's intact.   Ears: normal TM's and external ear canals AU  Nose: Nares normal. Septum midline. Mucosa normal. No drainage or sinus tenderness.  Throat: Lips, mucosa, and tongue normal. Teeth and gums normal  Lungs: clear to auscultation bilaterally, no wheezes, no increased work of breathing  Heart: regular rate and rhythm, S1, S2 normal, no murmur, click, rub or gallop  Abdomen: soft, non-tender. Bowel sounds normal. No masses,  no organomegaly  Extremities: extremities normal, atraumatic, no cyanosis or edema  Pulses: 2+ and symmetric  Skin: Skin color, texture, turgor normal. No rashes or lesions  Lymph nodes: Cervical, supraclavicular, and axillary nodes normal.  Neurologic: Alert and oriented X 3, normal strength and tone. Normal symmetric reflexes. Normal coordination and gait      Assessment/Plan  1. Headache (784.0)  KETOROLAC TROMETHAMINE INJ, THER/PROPH/DIAG INJECTION, SUBCUT/IM   2. HTN (hypertension) (401.9AF)       Pt felt  much better before leaving the office after the toradol.  likely migraine headaches, will try imitrex when next occurs.  I advised her if she gets more than 1 a month she may need neuro eval.   She will take her bp meds and monitor her bp and let me know if majority are elevated.

## 2008-12-26 NOTE — Telephone Encounter (Signed)
Patient advised to go to ER per Dr. Charlie Pitter.  Patient was agreeable to this.

## 2008-12-26 NOTE — Telephone Encounter (Signed)
Message copied by Serita Butcher on Fri Dec 26, 2008  2:07 PM  ------       Message from: Raj Janus       Created: Fri Dec 26, 2008  1:53 PM       Regarding: RE: eley         i advised she needs to go to the ED, she needs imaging              ----- Message -----          From: Serita Butcher, LPN          Sent: 12/26/2008   1:33 PM            To: Bernita Buffy, MD       Subject: Annell Greening: eley                                                                 ----- Message -----          From: Guy Begin          Sent: 12/26/2008   1:01 PM            To: [facility] Nurse Pool       Subject: Lennette Bihari, Lura Em [phone] Case ID  Patientid  Status  Priority  Type  Assignedto  Created  Createdby  Description        view/update  [id number]  [id number]  CLOSED  1  [facility]_Nurse Message  pezell  12/26/2008  [email or web address]  [facility] 12/26/08 12:05pm tjohnson Dr.Eley dob:December 22, 2060 Patient would like to inform nurse that after recent appointment(12/23/08)she is not feeling any better. She is now having headaches and severe nausea. Patient may reached at [phone]. Alternate number is [phone] at 1:01 assigned to dr eley/pe

## 2009-03-19 NOTE — Progress Notes (Signed)
Subjective  Pamela Harrison is a 49 y.o. female who presents for:  Bipolar disorder - followed by [facility], on citalopram, hydroxyzine, carbamazepine and trazadone - needs Labs for [facility] [place]  Fax [phone]  Headaches - seen multiple times for these, recurrent - has had associated nausea with them.  Was sent to ed once, unclear if she went - needs referral   Allergies   Allergen Reactions   ??? Sulfa (Sulfonamide Antibiotics) Nausea and Vomiting       Medications::has a current medication list which includes cholecalciferol (vitamin d3), zolpidem, ferrous sulfate, famotidine, citalopram, potassium, hydroxyzine, lisinopril-hydrochlorothiazide, carbamazepine, sumatriptan, promethazine-codeine, and trazodone.  Past Medical History:has a past medical history of Hypertension; Menopause; BIPOLAR DISORDER; Anemia; Depression; and Bipolar depression (06/29/2008).  Past Surgical History: has past surgical history that includes cholecystectomy (1988); tubal ligation (1995); and carpal tunnel release (2000).  Social History: reports that she has been smoking.  She does not have any smokeless tobacco history on file.  She reports that she does not currently drink alcohol.    ROS   General: negative for - chills, fatigue, fever, weight change  Psych: negative for - anxiety, depression, irritability or mood swings  ENT: negative for - headaches, hearing change, nasal congestion, oral lesions, sneezing or sore throat  Heme/ Lymph: negative for - bleeding problems, bruising, pallor or swollen lymph nodes  Endo: negative for - hot flashes, polydipsia/polyuria or temperature intolerance  Resp: negative for - cough, shortness of breath or wheezing  CV: negative for - chest pain, edema or palpitations  GI: negative for - abdominal pain, change in bowel habits, constipation, diarrhea or nausea/vomiting  GU: negative for - dysuria, hematuria, incontinence, pelvic pain or vulvar/vaginal symptoms   MSK: negative for - joint pain, joint swelling or muscle pain  Neuro: negative for - confusion, +headaches, seizures or weakness  Derm: negative for - dry skin, hair changes, rash or skin lesion changes      Physical Exam  BP 130/88   Pulse 89   Temp(Src) 97.3 ??F (36.3 ??C) (Oral)   Resp 12   Wt 180 lb (81.647 kg)  General appearance: alert, cooperative, no distress, appears stated age  Head: Normocephalic, without obvious abnormality, atraumatic  Eyes: conjunctivae/corneas clear. PERRL, EOM's intact.   Ears: normal TM's and external ear canals AU  Nose: Nares normal. Septum midline. Mucosa normal. No drainage or sinus tenderness.  Throat: Lips, mucosa, and tongue normal. Teeth and gums normal  Neck: supple, symmetrical, trachea midline, no adenopathy, thyroid: not enlarged, symmetric, no tenderness/mass/nodules, no carotid bruit and no JVD  Back: symmetric, no curvature. ROM normal. No CVA tenderness.  Lungs: clear to auscultation bilaterally, no wheezes, no increased work of breathing  Heart: regular rate and rhythm, S1, S2 normal, no murmur, click, rub or gallop  Extremities: extremities normal, atraumatic, no cyanosis or edema  Pulses: 2+ and symmetric  Skin: Skin color, texture, turgor normal. No rashes or lesions  Lymph nodes: Cervical, supraclavicular, and axillary nodes normal.  Neurologic: Alert and oriented X 3, normal strength and tone. Normal symmetric reflexes. Normal coordination and gait    Assessment/Plan  1. Bipolar depression (296.50H)  CBC WITH AUTOMATED DIFF, HEPATIC FUNCTION PANEL, CARBAMAZEPINE   2. Headache (784.0)  REFERRAL TO NEUROLOGY   3. Encounter for long-term (current) use of medications (V58.69S)  CBC WITH AUTOMATED DIFF, HEPATIC FUNCTION PANEL, CARBAMAZEPINE

## 2009-03-19 NOTE — Progress Notes (Signed)
Pamela Harrison is a 49 y.o. female who had a chief complaint of Follow-up.

## 2009-03-20 LAB — CBC WITH AUTOMATED DIFF
ABS. BASOPHILS: 0 10*3/uL (ref 0.0–0.2)
ABS. EOSINOPHILS: 0.3 10*3/uL (ref 0.0–0.4)
ABS. IMM. GRANS.: 0 10*3/uL (ref 0.0–0.1)
ABS. LYMPHOCYTES: 2.7 10*3/uL (ref 0.7–4.5)
ABS. MONOCYTES: 0.5 10*3/uL (ref 0.1–1.0)
ABS. NEUTROPHILS: 5.4 10*3/uL (ref 1.8–7.8)
BASOPHILS: 0 % (ref 0–3)
EOSINOPHILS: 3 % (ref 0–7)
HCT: 38.8 % (ref 34.0–44.0)
HGB: 12.7 g/dL (ref 11.5–15.0)
IMMATURE GRANULOCYTES: 0 % (ref 0–1)
LYMPHOCYTES: 31 % (ref 14–46)
MCH: 30.2 pg (ref 27.0–34.0)
MCHC: 32.7 g/dL (ref 32.0–36.0)
MCV: 92 fL (ref 80–98)
MONOCYTES: 6 % (ref 4–13)
NEUTROPHILS: 60 % (ref 40–74)
PLATELET: 291 10*3/uL (ref 140–415)
RBC: 4.21 x10E6/uL (ref 3.80–5.10)
RDW: 13.1 % (ref 11.7–15.0)
WBC: 8.9 10*3/uL (ref 4.0–10.5)

## 2009-03-20 LAB — HEPATIC FUNCTION PANEL
ALT (SGPT): 18 IU/L (ref 0–40)
AST (SGOT): 11 IU/L (ref 0–40)
Albumin: 4.3 g/dL (ref 3.5–5.5)
Alk. phosphatase: 71 IU/L (ref 25–150)
Bilirubin, direct: 0.07 mg/dL (ref 0.00–0.40)
Bilirubin, total: 0.2 mg/dL (ref 0.0–1.2)
Protein, total: 6.9 g/dL (ref 6.0–8.5)

## 2009-03-20 LAB — CARBAMAZEPINE: Carbamazepine: <0.5 ug/mL — ABNORMAL LOW (ref 4.0–12.0)

## 2009-03-20 NOTE — Telephone Encounter (Signed)
Lab results faxed.

## 2009-03-20 NOTE — Telephone Encounter (Signed)
appt 03-08-11at 11:20 dr Bethena Roys neurology [phone]  [address]

## 2009-03-20 NOTE — Telephone Encounter (Signed)
Message copied by Serita Butcher on Fri Mar 20, 2009 11:44 AM  ------       Message from: Raj Janus       Created: Fri Mar 20, 2009  8:22 AM         i printed a copy of her labs, can you please fax to [facility] [place]       Fax [phone]              ----- Message -----          From: [facility] Results In [facility]          Sent: 03/20/2009   7:57 AM            To: Bernita Buffy, MD

## 2009-04-07 MED ORDER — LISINOPRIL-HYDROCHLOROTHIAZIDE 10 MG-12.5 MG TAB
ORAL_TABLET | Freq: Every day | ORAL | Status: DC
Start: 2009-04-07 — End: 2012-08-07

## 2009-04-07 NOTE — Telephone Encounter (Signed)
Received refill request from [facility].

## 2012-07-30 NOTE — Telephone Encounter (Signed)
Call transferred from [facility]. Patient advised that her BP is 187/115 and has been elevated for the last three days. She states her feet are burning and she also has an ache in her left arm. Patient advised to seek Emergent care at the nearest ER. Dr. Ernestine Conrad agrees. Patient verbalized understanding.

## 2012-07-30 NOTE — Telephone Encounter (Signed)
Pt called complaining of her blood pressure 187/115, transfer call to [place]. Thanks

## 2012-08-02 LAB — CBC WITH AUTOMATED DIFF
ABS. BASOPHILS: 0 10*3/uL (ref 0.0–0.1)
ABS. EOSINOPHILS: 0.2 10*3/uL (ref 0.0–0.4)
ABS. LYMPHOCYTES: 2.5 10*3/uL (ref 0.8–3.5)
ABS. MONOCYTES: 0.8 10*3/uL (ref 0.0–1.0)
ABS. NEUTROPHILS: 5.5 10*3/uL (ref 1.8–8.0)
BASOPHILS: 0 % (ref 0–1)
EOSINOPHILS: 2 % (ref 0–7)
HCT: 36.9 % (ref 35.0–47.0)
HGB: 12.8 g/dL (ref 11.5–16.0)
LYMPHOCYTES: 28 % (ref 12–49)
MCH: 29.8 PG (ref 26.0–34.0)
MCHC: 34.7 g/dL (ref 30.0–36.5)
MCV: 85.8 FL (ref 80.0–99.0)
MONOCYTES: 9 % (ref 5–13)
NEUTROPHILS: 61 % (ref 32–75)
PLATELET: 226 10*3/uL (ref 150–400)
RBC: 4.3 M/uL (ref 3.80–5.20)
RDW: 12.9 % (ref 11.5–14.5)
WBC: 9 10*3/uL (ref 3.6–11.0)

## 2012-08-02 LAB — POC CHEM8
Anion gap (POC): 17 mmol/L — ABNORMAL HIGH (ref 5–15)
BUN (POC): 11 MG/DL (ref 9–20)
CO2 (POC): 23 MMOL/L (ref 21–32)
Calcium, ionized (POC): 1.12 MMOL/L (ref 1.12–1.32)
Chloride (POC): 105 MMOL/L (ref 98–107)
Creatinine (POC): 0.7 MG/DL (ref 0.6–1.3)
GFRAA, POC: >60 mL/min/{1.73_m2} (ref >60)
GFRNA, POC: >60 mL/min/{1.73_m2} (ref >60)
Glucose (POC): 88 MG/DL (ref 65–105)
Hematocrit (POC): 35 % (ref 35.0–47.0)
Hemoglobin (POC): 11.9 GM/DL (ref 11.5–16.0)
Potassium (POC): 3.7 MMOL/L (ref 3.5–5.1)
Sodium (POC): 140 MMOL/L (ref 136–145)

## 2012-08-02 LAB — POC TROPONIN-I: Troponin-I (POC): <0.04 ng/mL (ref 0.00–0.08)

## 2012-08-02 MED ORDER — SODIUM CHLORIDE 0.9 % IJ SYRG
INTRAMUSCULAR | Status: DC | PRN
Start: 2012-08-02 — End: 2012-08-02

## 2012-08-02 MED ORDER — ENALAPRILAT 1.25 MG/ML IV INJ
1.25 mg/mL | INTRAVENOUS | Status: AC
Start: 2012-08-02 — End: 2012-08-02
  Administered 2012-08-02: 13:00:00 via INTRAVENOUS

## 2012-08-02 MED ORDER — SODIUM CHLORIDE 0.9 % IJ SYRG
Freq: Three times a day (TID) | INTRAMUSCULAR | Status: DC
Start: 2012-08-02 — End: 2012-08-02
  Administered 2012-08-02: 13:00:00 via INTRAVENOUS

## 2012-08-02 MED ORDER — ASPIRIN 325 MG TAB
325 mg | ORAL | Status: AC
Start: 2012-08-02 — End: 2012-08-02
  Administered 2012-08-02: 13:00:00 via ORAL

## 2012-08-02 MED ORDER — LISINOPRIL 10 MG TAB
10 mg | ORAL_TABLET | Freq: Every day | ORAL | Status: AC
Start: 2012-08-02 — End: 2012-08-12

## 2012-08-02 MED FILL — ENALAPRILAT 1.25 MG/ML IV INJ: 1.25 mg/mL | INTRAVENOUS | Qty: 2

## 2012-08-02 NOTE — ED Notes (Signed)
Patient placed on O2 at 2lpm via nc.

## 2012-08-02 NOTE — ED Notes (Signed)
Pt reports she was checking her BP and it was high 186/117. Pt states she should be on BP meds but does not have insurance or a doctor. Pt also complains a headache, left hand tingling, bilateral aching arms and bilateral burning feet. Denies chest pain and SOB.

## 2012-08-02 NOTE — ED Notes (Signed)
Ambulatory to restroom.

## 2012-08-02 NOTE — ED Provider Notes (Signed)
Patient is a 52 y.o. female presenting with hypertension and headaches. The history is provided by the patient.   Hypertension   This is a recurrent (patient has been off meds for a long time and the past week has noted issues with her BP getting gradually worse and now with symptoms as in before.) problem. The current episode started more than 1 week ago. The problem has been gradually worsening. Associated symptoms include chest pain, anxiety, malaise/fatigue and headaches. Pertinent negatives include no palpitations, no neck pain, no peripheral edema, no dizziness, no shortness of breath, no nausea and no vomiting. Associated symptoms comments: Aches about both shoulders and her legs burn. Risk factors include smoking/tobacco exposure and hypertension.   Headache   Associated symptoms include malaise/fatigue. Pertinent negatives include no palpitations, no shortness of breath, no dizziness, no nausea and no vomiting.        Past Medical History   Diagnosis Date   ??? Hypertension    ??? Menopause    ??? BIPOLAR DISORDER    ??? Anemia      by history   ??? Depression    ??? Bipolar depression 06/29/2008        Past Surgical History   Procedure Laterality Date   ??? Hx cholecystectomy  1988   ??? Hx tubal ligation  1995   ??? Hx carpal tunnel release  2000     bilateral         Family History   Problem Relation Age of Onset   ??? Hypertension Father    ??? Hypertension Paternal Grandmother         History     Social History   ??? Marital Status: DIVORCED     Spouse Name: N/A     Number of Children: N/A   ??? Years of Education: N/A     Occupational History   ??? Not on file.     Social History Main Topics   ??? Smoking status: Current Every Day Smoker   ??? Smokeless tobacco: Not on file   ??? Alcohol Use: No   ??? Drug Use:    ??? Sexually Active:      Other Topics Concern   ??? Not on file     Social History Narrative   ??? No narrative on file                  ALLERGIES: Benadryl; Depakote; and Sulfa (sulfonamide antibiotics)      Review of Systems    Constitutional: Positive for malaise/fatigue. Negative for appetite change and unexpected weight change.   HENT: Negative for facial swelling, rhinorrhea, trouble swallowing, neck pain, dental problem and ear discharge.    Eyes: Negative for pain and discharge.   Respiratory: Negative for apnea, shortness of breath and stridor.    Cardiovascular: Positive for chest pain. Negative for palpitations and leg swelling.   Gastrointestinal: Negative for nausea, vomiting, abdominal pain, diarrhea, blood in stool and abdominal distention.   Genitourinary: Negative for dysuria, hematuria, flank pain and difficulty urinating.   Musculoskeletal: Positive for arthralgias. Negative for myalgias, back pain and joint swelling.   Skin: Negative for color change, rash and wound.   Neurological: Positive for headaches. Negative for dizziness, facial asymmetry, speech difficulty and numbness.   Hematological: Negative for adenopathy.   Psychiatric/Behavioral: Negative for suicidal ideas, hallucinations, behavioral problems, self-injury and agitation.       Filed Vitals:    08/02/12 0800   BP: 191/105   Pulse:  80   Temp: 98.3 ??F (36.8 ??C)   Resp: 16   Height: 5\' 6"  (1.676 m)   Weight: 91.264 kg (201 lb 3.2 oz)   SpO2: 95%            Physical Exam   Nursing note and vitals reviewed.  Constitutional: She is oriented to person, place, and time. She appears well-developed and well-nourished. She appears distressed.   anxious   HENT:   Head: Normocephalic and atraumatic.   Right Ear: External ear normal.   Left Ear: External ear normal.   Mouth/Throat: Oropharynx is clear and moist. No oropharyngeal exudate.   Eyes: Conjunctivae and EOM are normal. Pupils are equal, round, and reactive to light. Right eye exhibits no discharge. Left eye exhibits no discharge. No scleral icterus.   Neck: Normal range of motion. No tracheal deviation present. No thyromegaly present.   Cardiovascular: Normal rate, regular rhythm and normal heart sounds.    No  murmur heard.  Pulmonary/Chest: Effort normal and breath sounds normal. No respiratory distress. She has no wheezes. She has no rales. She exhibits no tenderness.   Abdominal: Soft. Bowel sounds are normal. She exhibits no distension. There is no tenderness. There is no rebound and no guarding.   Musculoskeletal: Normal range of motion. She exhibits no edema and no tenderness.   Lymphadenopathy:     She has no cervical adenopathy.   Neurological: She is alert and oriented to person, place, and time. No cranial nerve deficit. Coordination normal.   Skin: Skin is warm. No erythema.   Psychiatric: Her behavior is normal. Judgment and thought content normal.        MDM     Amount and/or Complexity of Data Reviewed:   Clinical lab tests:  Ordered and reviewed  Tests in the radiology section of CPT??:  Ordered and reviewed  Tests in the medicine section of the CPT??:  Ordered and reviewed   Review and summarize past medical records:  Yes   Independant visualization of image, tracing, or specimen:  Yes  Risk of Significant Complications, Morbidity, and/or Mortality:   Presenting problems:  Moderate  Diagnostic procedures:  Moderate  Management options:  Moderate  General Comments: Was on Lisinopril previously with good results. Will start by IV.  Will check labs and EKG.  Plan DC if stable with close follow up and start back on meds.  Also discussed smoking  0940- resting comfortably.  BP stable.  Will dc and start on lisinopril with closse follow up  Progress:   Patient progress:  Improved      Procedures    ED EKG interpretation:  Rhythm: normal sinus rhythm; and regular . Rate (approx.): 78; Axis: normal; P wave: normal; QRS interval: normal ; ST/T wave: normal; in  Lead: poor r wave progression; Other findings: abnormal ekg. This EKG was interpreted by Sunnie Nielsen. Mort Sawyers, DO,ED Provider.

## 2012-08-02 NOTE — ED Notes (Signed)
Discharge instructions reviewed with patient.  Patient verbalized understanding of instructions and agrees to follow-up care as recommended by provider.  Patient in no apparent distress and vital signs are within normal limits with reassessment of pain noted under 'vital signs' area of chart.   Patient ambulated out of ED accompanied by mother.

## 2012-08-03 LAB — EKG, 12 LEAD, INITIAL
Atrial Rate: 78 {beats}/min
Calculated P Axis: 26 degrees
Calculated R Axis: 6 degrees
Calculated T Axis: 40 degrees
Diagnosis: NORMAL
P-R Interval: 170 ms
Q-T Interval: 380 ms
QRS Duration: 84 ms
QTC Calculation (Bezet): 433 ms
Ventricular Rate: 78 {beats}/min

## 2012-08-07 MED ORDER — LISINOPRIL-HYDROCHLOROTHIAZIDE 10 MG-12.5 MG TAB
ORAL_TABLET | Freq: Every day | ORAL | Status: DC
Start: 2012-08-07 — End: 2016-12-29

## 2012-08-07 NOTE — Progress Notes (Signed)
Reviewed all necessary information and documentation with the patient

## 2012-08-07 NOTE — Patient Instructions (Signed)
Plantar Fasciitis: After Your Visit  Your Care Instructions     Plantar fasciitis is pain and inflammation of the plantar fascia, the tissue at the bottom of your foot that connects the heel bone to the toes. The plantar fascia also supports the arch. If you strain the plantar fascia, it can develop small tears and cause heel pain when you stand or walk.  Plantar fasciitis can be caused by running or other sports. It also may occur in people who are overweight or who have high arches or flat feet. You may get plantar fasciitis if you walk or stand for long periods, or have a tight Achilles tendon or calf muscles.  You can improve your foot pain with rest and other care at home. It might take a few weeks to a few months for your foot to heal completely.  Follow-up care is a key part of your treatment and safety. Be sure to make and go to all appointments, and call your doctor if you are having problems. It's also a good idea to know your test results and keep a list of the medicines you take.  How can you care for yourself at home?  ?? Rest your feet often. Reduce your activity to a level that lets you avoid pain. If possible, do not run or walk on hard surfaces.  ?? Take pain medicines exactly as directed.  ?? If the doctor gave you a prescription medicine for pain, take it as prescribed.  ?? If you are not taking a prescription pain medicine, take an over-the-counter anti-inflammatory medicine for pain and swelling, such as ibuprofen (Advil, Motrin) or naproxen (Aleve). Read and follow all instructions on the label.  ?? Use ice massage to help with pain and swelling. You can use an ice cube or an ice cup several times a day. To make an ice cup, fill a paper cup with water and freeze it. Cut off the top of the cup until a half-inch of ice shows. Hold onto the remaining paper to use the cup. Rub the ice in small circles over the area for 5 to 7 minutes.  ?? Contrast baths, which alternate hot and cold water, can also  help reduce swelling. But because heat alone may make pain and swelling worse, end a contrast bath with a soak in cold water.  ?? Wear a night splint if your doctor suggests it. A night splint holds your foot with the toes pointed up and the foot and ankle at a 90-degree angle. This position gives the bottom of your foot a constant, gentle stretch.  ?? Do simple exercises such as calf stretches and towel stretches 2 to 3 times each day, especially when you first get up in the morning. These can help the plantar fascia become more flexible. They also make the muscles that support your arch stronger. Hold these stretches for 15 to 30 seconds per stretch. Repeat 2 to 4 times.  ?? Stand about 1 foot from a wall. Place the palms of both hands against the wall at chest level. Lean forward against the wall, keeping one leg with the knee straight and heel on the ground while bending the knee of the other leg.  ?? Sit down on the floor or a mat with your feet stretched in front of you. Roll up a towel lengthwise, and loop it over the ball of your foot. Holding the towel at both ends, gently pull the towel toward you to stretch your foot.  ??   Wear shoes with good arch support. Athletic shoes or shoes with a well-cushioned sole are good choices.  ?? Try heel cups or shoe inserts (orthotics) to help cushion your heel. You can buy these at many shoe stores.  ?? Put on your shoes as soon as you get out of bed. Going barefoot or wearing slippers may make your pain worse.  ?? Reach and stay at a good weight for your height. This puts less strain on your feet.  When should you call for help?  Call your doctor now or seek immediate medical care if:  ?? You have heel pain with fever, redness, or warmth in your heel.  ?? You cannot put weight on the sore foot.  Watch closely for changes in your health, and be sure to contact your doctor if:  ?? You have numbness or tingling in your heel.  ?? Your heel pain lasts more than 2 weeks.   Where can you  learn more?   Go to http://www.healthwise.net/[facility]  Enter X351 in the search box to learn more about "Plantar Fasciitis: After Your Visit."   ?? 2006-2014 Healthwise, Incorporated. Care instructions adapted under license by [facility] (which disclaims liability or warranty for this information). This care instruction is for use with your licensed healthcare professional. If you have questions about a medical condition or this instruction, always ask your healthcare professional. Healthwise, Incorporated disclaims any warranty or liability for your use of this information.  Content Version: 10.1.311062; Current as of: August 30, 2011              Plantar Fasciitis: Exercises  Your Care Instructions  Here are some examples of typical rehabilitation exercises for your condition. Start each exercise slowly. Ease off the exercise if you start to have pain.  Your doctor or physical therapist will tell you when you can start these exercises and which ones will work best for you.  How to do the exercises  Note: Each exercise should create a pulling feeling but should not cause pain.  Towel stretch    1. Sit with your legs extended and knees straight.  2. Place a towel around your foot just under the toes. A towel will give you a more effective stretch.  3. Hold each end of the towel in each hand, with your hands above your knees.  4. Pull back with the towel so that your foot stretches toward you.  5. Hold the position for at least 15 to 30 seconds.  6. Repeat 2 to 4 times a session, up to 5 sessions a day.  Calf stretch    Note: This exercise stretches the muscles at the back of the lower leg (the calf) and the Achilles tendon. Do this exercise 3 or 4 times a day, 5 days a week.  1. Stand facing a wall with your hands on the wall at about eye level. Put the leg you want to stretch about a step behind your other leg.  2. Keeping your back heel on the floor, bend your front knee until you feel a stretch in the back leg.   3. Hold the stretch for 15 to 30 seconds. Repeat 2 to 4 times.  Plantar fascia and calf stretch    Note: Stretching the plantar fascia and calf muscles can increase flexibility and decrease heel pain. You can do this exercise several times each day and before and after activity.  1. Stand on a step as shown above. Be sure to   hold on to the banister.  2. Slowly let your heels down over the edge of the step as you relax your calf muscles. You should feel a gentle stretch across the bottom of your foot and up the back of your leg to your knee.  3. Hold the stretch about 15 to 30 seconds, and then tighten your calf muscle a little to bring your heel back up to the level of the step. Repeat 2 to 4 times.  Towel curls    1. While sitting, place your foot on a towel on the floor and scrunch the towel toward you with your toes.  2. Then, also using your toes, push the towel away from you.  Note: Make this exercise more challenging by placing a weighted object, such as a soup can, on the other end of the towel.  Marble pickups    1. Put marbles on the floor next to a cup.  2. Using your toes, try to lift the marbles up from the floor and put them in the cup.  Follow-up care is a key part of your treatment and safety. Be sure to make and go to all appointments, and call your doctor if you are having problems. It's also a good idea to know your test results and keep a list of the medicines you take.   Where can you learn more?   Go to http://www.healthwise.net/[facility]  Enter X377 in the search box to learn more about "Plantar Fasciitis: Exercises."   ?? 2006-2014 Healthwise, Incorporated. Care instructions adapted under license by [facility] (which disclaims liability or warranty for this information). This care instruction is for use with your licensed healthcare professional. If you have questions about a medical condition or this instruction, always ask your healthcare professional. Healthwise, Incorporated disclaims  any warranty or liability for your use of this information.  Content Version: 10.1.311062; Current as of: December 09, 2011

## 2012-08-08 NOTE — Progress Notes (Signed)
Chief Complaint   Patient presents with   ??? Hospital Follow Up     high BP   ??? Foot Pain     both feet burn x7 years     she is a 52 y.o. year old female new to practice who presents to establish care. Patient recently seen in ED for HTN. Patient had been off of HTN medication for some time. Has hx of bipolar disorder. Says mood has been stable. Also with complaint of b/l heel pain worse after rest. She is a smoker. Patient feeling good sans other complaints or concerns at this time.    Patient Active Problem List   Diagnosis Code   ??? Hypertension 401.9   ??? Anemia 285.9   ??? Bipolar depression 296.50     Past Surgical History   Procedure Laterality Date   ??? Hx cholecystectomy  1988   ??? Hx tubal ligation  1995   ??? Hx carpal tunnel release  2000     bilateral     History     Social History   ??? Marital Status: DIVORCED     Spouse Name: N/A     Number of Children: N/A   ??? Years of Education: N/A     Occupational History   ??? Not on file.     Social History Main Topics   ??? Smoking status: Current Every Day Smoker   ??? Smokeless tobacco: Never Used   ??? Alcohol Use: No   ??? Drug Use: Not on file   ??? Sexually Active: Not on file     Other Topics Concern   ??? Not on file     Social History Narrative   ??? No narrative on file     Family History   Problem Relation Age of Onset   ??? Hypertension Father    ??? Hypertension Paternal Grandmother      Current Outpatient Prescriptions   Medication Sig   ??? lithium carbonate 300 mg capsule Take 1 Cap by mouth two (2) times daily (with meals).   ??? lisinopril-hydrochlorothiazide (PRINZIDE, ZESTORETIC) 10-12.5 mg per tablet Take 1 Tab by mouth daily.   ??? FLUoxetine (PROZAC) 40 mg capsule Take 40 mg by mouth daily.   ??? ranitidine (ZANTAC) 150 mg tablet Take 300 mg by mouth two (2) times a day.   ??? docusate sodium (COLACE) 100 mg capsule Take 200 mg by mouth two (2) times a day.   ??? OLANZapine (ZYPREXA) 15 mg tablet Take 15 mg by mouth nightly.   ??? lisinopril (PRINIVIL) 10 mg tablet Take 1 Tab by  mouth daily for 10 days.   ??? trazodone (DESYREL) 150 mg tablet Take 150 mg by mouth nightly.   ??? carbamazepine (EPITOL) 200 mg tablet Take 200 mg by mouth two (2) times a day.     No current facility-administered medications for this visit.     Allergies   Allergen Reactions   ??? Benadryl (Diphenhydramine Hcl) Other (comments)     Pt unsure why   ??? Depakote (Divalproex) Diarrhea   ??? Sulfa (Sulfonamide Antibiotics) Nausea and Vomiting       Review of Systems  Constitutional: feeling well, no fatigue, malaise  Skin: No rash or lesion  HEENT: No acute hearing or vision changes  Resp: denies cough, wheezing or SOB  CV: denies chest pain, dizziness or palpitations  GI: No nausea or abdominal pain  GU: No dysuria or voiding dysfunction  MS: see HPI, b/l plantar foot  pain  Neuro: denies HA, weakness or paresthesia  Psych: hx of bipolar disorder, mood stable    Filed Vitals:    08/07/12 1347   BP: 143/91   Pulse: 72   Temp: 97.5 ??F (36.4 ??C)   TempSrc: Oral   Resp: 20   Height: 5\' 6"  (1.676 m)   Weight: 198 lb 6.4 oz (89.994 kg)   SpO2: 95%       Physical Examination:  Gen: Well developed, well nourished in no acute distress  Skin: Warm and dry sans rash or lesion  Head: Normocephalic, atraumatic  Eyes: Sclera clear, EOMI, PERRL  Oropharynx: Moist mucous membranes   Neck: Normal range of motion  Respiratory: CTAB with symmetrical, unlabored effort  CV: Normal S1, S2, Regular rate and rhythm  Extremities: b/l plantar heel pain, FROM  Neurology: Active, alert and oriented, No focal deficits  Psych: Affect appropriate     Naavya was seen today for hospital follow up and foot pain.    Diagnoses and associated orders for this visit:    Hypertension  - lisinopril-hydrochlorothiazide (PRINZIDE, ZESTORETIC) 10-12.5 mg per tablet; Take 1 Tab by mouth daily.  - METABOLIC PANEL, BASIC; Future  - LIPID PANEL; Future  - METABOLIC PANEL, BASIC  - LIPID PANEL    Bipolar affective disorder  - lithium carbonate 300 mg capsule; Take 1 Cap by  mouth two (2) times daily (with meals).    Plantar fasciitis    Tobacco abuse    Screening for colon cancer  - AMB FECAL OCCULT BLOOD QL-3 CARDS         I have discussed the diagnosis with the patient and the intended plan as seen in the above orders. The patient has received an after-visit summary. The patient expresses understanding and agreement with our plan of care. I have discussed medication side effects and warnings with the patient as well.    Follow-up Disposition:  Return in about 4 weeks (around 09/05/2012), or if symptoms worsen or fail to improve.

## 2012-08-09 LAB — METABOLIC PANEL, BASIC
BUN/Creatinine ratio: 14 (ref 9–23)
BUN: 11 mg/dL (ref 6–24)
CO2: 26 mmol/L (ref 18–29)
Calcium: 9 mg/dL (ref 8.7–10.2)
Chloride: 101 mmol/L (ref 97–108)
Creatinine: 0.8 mg/dL (ref 0.57–1.00)
GFR est AA: 97 mL/min/{1.73_m2} (ref >59)
GFR est non-AA: 84 mL/min/{1.73_m2} (ref >59)
Glucose: 82 mg/dL (ref 65–99)
Potassium: 4.6 mmol/L (ref 3.5–5.2)
Sodium: 140 mmol/L (ref 134–144)

## 2012-08-09 LAB — LIPID PANEL
Cholesterol, total: 236 mg/dL — ABNORMAL HIGH (ref 100–199)
HDL Cholesterol: 41 mg/dL (ref >39)
LDL, calculated: 148 mg/dL — ABNORMAL HIGH (ref 0–99)
Triglyceride: 235 mg/dL — ABNORMAL HIGH (ref 0–149)
VLDL, calculated: 47 mg/dL — ABNORMAL HIGH (ref 5–40)

## 2012-09-05 NOTE — Telephone Encounter (Signed)
Received updated med list from [facility]. Med list updated

## 2013-07-01 ENCOUNTER — Encounter (HOSPITAL_COMMUNITY): Payer: Self-pay | Admitting: Emergency Medicine

## 2013-07-01 ENCOUNTER — Emergency Department (HOSPITAL_COMMUNITY)
Admission: EM | Admit: 2013-07-01 | Discharge: 2013-07-02 | Disposition: A | Discharge status: Other Acute Inpatient Hospital | Payer: Federal, State, Local not specified - Other | Attending: Emergency Medicine | Admitting: Emergency Medicine

## 2013-07-01 DIAGNOSIS — Z9119 Patient's noncompliance with other medical treatment and regimen: Secondary | ICD-10-CM | POA: Insufficient documentation

## 2013-07-01 DIAGNOSIS — Z9114 Patient's other noncompliance with medication regimen: Secondary | ICD-10-CM

## 2013-07-01 DIAGNOSIS — Z87891 Personal history of nicotine dependence: Secondary | ICD-10-CM | POA: Insufficient documentation

## 2013-07-01 DIAGNOSIS — F329 Major depressive disorder, single episode, unspecified: Secondary | ICD-10-CM

## 2013-07-01 DIAGNOSIS — Z91199 Patient's noncompliance with other medical treatment and regimen due to unspecified reason: Secondary | ICD-10-CM | POA: Insufficient documentation

## 2013-07-01 DIAGNOSIS — F3289 Other specified depressive episodes: Secondary | ICD-10-CM | POA: Insufficient documentation

## 2013-07-01 DIAGNOSIS — R45851 Suicidal ideations: Secondary | ICD-10-CM | POA: Insufficient documentation

## 2013-07-01 DIAGNOSIS — F32A Depression, unspecified: Secondary | ICD-10-CM

## 2013-07-01 DIAGNOSIS — Z79899 Other long term (current) drug therapy: Secondary | ICD-10-CM | POA: Insufficient documentation

## 2013-07-01 DIAGNOSIS — F319 Bipolar disorder, unspecified: Secondary | ICD-10-CM | POA: Insufficient documentation

## 2013-07-01 DIAGNOSIS — I10 Essential (primary) hypertension: Secondary | ICD-10-CM | POA: Insufficient documentation

## 2013-07-01 HISTORY — DX: Essential (primary) hypertension: I10

## 2013-07-01 HISTORY — DX: Bipolar disorder, unspecified: F31.9

## 2013-07-01 LAB — RAPID URINE DRUG SCREEN, HOSP PERFORMED
Amphetamines: NOT DETECTED
BARBITURATES: NOT DETECTED
BENZODIAZEPINES: NOT DETECTED
COCAINE: POSITIVE — AB
Opiates: NOT DETECTED
Tetrahydrocannabinol: NOT DETECTED

## 2013-07-01 LAB — COMPREHENSIVE METABOLIC PANEL
ALT: 21 U/L (ref 0–35)
AST: 15 U/L (ref 0–37)
Albumin: 3.9 g/dL (ref 3.5–5.2)
Alkaline Phosphatase: 86 U/L (ref 39–117)
BUN: 6 mg/dL (ref 6–23)
CALCIUM: 9.5 mg/dL (ref 8.4–10.5)
CO2: 26 mEq/L (ref 19–32)
Chloride: 104 mEq/L (ref 96–112)
Creatinine, Ser: 0.63 mg/dL (ref 0.50–1.10)
GFR calc non Af Amer: >90 mL/min (ref >90)
GLUCOSE: 127 mg/dL — AB (ref 70–99)
Potassium: 3.1 mEq/L — ABNORMAL LOW (ref 3.7–5.3)
SODIUM: 144 meq/L (ref 137–147)
Total Bilirubin: 0.3 mg/dL (ref 0.3–1.2)
Total Protein: 7.4 g/dL (ref 6.0–8.3)

## 2013-07-01 LAB — CBC
HCT: 39.5 % (ref 36.0–46.0)
Hemoglobin: 14.1 g/dL (ref 12.0–15.0)
MCH: 30.2 pg (ref 26.0–34.0)
MCHC: 35.7 g/dL (ref 30.0–36.0)
MCV: 84.6 fL (ref 78.0–100.0)
PLATELETS: 262 10*3/uL (ref 150–400)
RBC: 4.67 MIL/uL (ref 3.87–5.11)
RDW: 12.3 % (ref 11.5–15.5)
WBC: 8.9 10*3/uL (ref 4.0–10.5)

## 2013-07-01 LAB — ETHANOL: Alcohol, Ethyl (B): <11 mg/dL (ref 0–11)

## 2013-07-01 LAB — LITHIUM LEVEL: Lithium Lvl: <0.25 mEq/L — ABNORMAL LOW (ref 0.80–1.40)

## 2013-07-01 MED ORDER — HYDROCHLOROTHIAZIDE 12.5 MG PO CAPS
12.5000 mg | ORAL_CAPSULE | Freq: Every day | ORAL | Status: DC
  Administered 2013-07-01 – 2013-07-02 (×2): 12.5 mg via ORAL
  Filled 2013-07-01 (×2): qty 1

## 2013-07-01 MED ORDER — LISINOPRIL 10 MG PO TABS
10.0000 mg | ORAL_TABLET | Freq: Every day | ORAL | Status: DC
  Administered 2013-07-01 – 2013-07-02 (×2): 10 mg via ORAL
  Filled 2013-07-01 (×2): qty 1

## 2013-07-01 MED ORDER — TRAZODONE HCL 50 MG PO TABS
150.0000 mg | ORAL_TABLET | Freq: Every day | ORAL | Status: DC
  Administered 2013-07-01: 150 mg via ORAL
  Filled 2013-07-01 (×2): qty 1

## 2013-07-01 MED ORDER — OLANZAPINE 10 MG PO TABS
20.0000 mg | ORAL_TABLET | Freq: Every day | ORAL | Status: DC
  Administered 2013-07-01 – 2013-07-02 (×2): 20 mg via ORAL
  Filled 2013-07-01 (×2): qty 2

## 2013-07-01 MED ORDER — CARBAMAZEPINE 200 MG PO TABS
200.0000 mg | ORAL_TABLET | Freq: Two times a day (BID) | ORAL | Status: DC
  Administered 2013-07-01 – 2013-07-02 (×2): 200 mg via ORAL
  Filled 2013-07-01 (×4): qty 1

## 2013-07-01 MED ORDER — FLUOXETINE HCL 20 MG PO CAPS
40.0000 mg | ORAL_CAPSULE | Freq: Every morning | ORAL | Status: DC
  Administered 2013-07-02: 40 mg via ORAL
  Filled 2013-07-01: qty 2

## 2013-07-01 MED ORDER — LITHIUM CARBONATE 300 MG PO CAPS
600.0000 mg | ORAL_CAPSULE | Freq: Every day | ORAL | Status: DC
  Administered 2013-07-01: 600 mg via ORAL
  Filled 2013-07-01: qty 2

## 2013-07-01 MED ORDER — POTASSIUM CHLORIDE CRYS ER 20 MEQ PO TBCR
40.0000 meq | EXTENDED_RELEASE_TABLET | Freq: Once | ORAL | Status: AC
  Administered 2013-07-01: 40 meq via ORAL
  Filled 2013-07-01: qty 2

## 2013-07-01 MED ORDER — LISINOPRIL-HYDROCHLOROTHIAZIDE 10-12.5 MG PO TABS
1.0000 | ORAL_TABLET | Freq: Every day | ORAL | Status: DC
Start: 2013-07-01 — End: 2013-07-01

## 2013-07-01 MED ORDER — ONDANSETRON 8 MG PO TBDP
8.0000 mg | ORAL_TABLET | Freq: Once | ORAL | Status: AC
  Administered 2013-07-01: 8 mg via ORAL
  Filled 2013-07-01: qty 1

## 2013-07-01 NOTE — ED Notes (Signed)
Pt alert, arrives from home with boyfriend, presents for medication refill, states shes been out of Psych medications for a month, states SI, "ideas of cutting my wrist", resp even unlabored, skin pwd

## 2013-07-01 NOTE — Progress Notes (Signed)
  CARE MANAGEMENT ED NOTE 07/01/2013  Patient:  Marisa Sherman, Marisa Sherman   Account Number:  [id number]  Date Initiated:  07/01/2013  Documentation initiated by:  Radford Pax  Subjective/Objective Assessment:   Patient presents to Ed with SI with plan to cut wrists.     Subjective/Objective Assessment Detail:   patient with pmhx of bipolar and HTN     Action/Plan:   Action/Plan Detail:   Anticipated DC Date:       Status Recommendation to Physician:   Result of Recommendation:    Other ED Services  Consult Working Plan    DC Planning Services  Other  PCP issues    Choice offered to / List presented to:            Status of service:  Completed, signed off  ED Comments:   ED Comments Detail:  EDCM spoke to patient at bedside regarding pcp issues. Patient reports she is in "the middle of finding a doctor." Patient confirms she lives in [place].  Patient was seen by [facility] rep and provided information regarding finding a pcp and orange card infomation.  [facility] provided patient with a list of pcps who accept self pay patients, list of discounted pahrmacies and websites needymeds.org and GoodRX.com for medication assistance,  list of financial resources in the community such as local churches and [employer], urban ministries,  address and phone number to [facility], phone number to inquire about Medicaid, affordable care act and dental assistance for patients without insurance.  Patient thankful for resources.  No further EDCM needs at this time.

## 2013-07-01 NOTE — ED Notes (Addendum)
Pt presents with complaint of SI, plan to cut wrist.  Denies HI or AV hallucinations.  Pt admits to SI in past, 1 year ago, ingested pills.  Pt reports diagnosed with Bipolar DO. Pt reports she used cocaine celebrating her birthday recently, denies alcohol use, feeling hopeless.  Pt calm & cooperative at present.  Will continue to monitor for safety.

## 2013-07-01 NOTE — ED Notes (Signed)
Patient eating dinner.  Calm and cooperative.

## 2013-07-01 NOTE — ED Provider Notes (Signed)
CSN: [id number]     Arrival date & time 07/01/13  1320 History   First MD Initiated Contact with Patient 07/01/13 1340     Chief Complaint  Patient presents with  . Medical Clearance     (Consider location/radiation/quality/duration/timing/severity/associated sxs/prior Treatment) HPI  Marisa Sherman is a 53 y.o. female who complains of suicidal ideation, since running out of some of her medications, one month ago. She recently moved to [place], and does not have a local doctor or her therapist. She has had thoughts of overdose, or slitting her wrists, but has not done, that recently. In the past, she has attempted suicide, by both of these means. No recent fever, chills, nausea, vomiting, cough, shortness of breath, chest pain, weakness, or dizziness. She has been sad and tearful. She feels depressed. There are no other known modifying factors    Past Medical History  Diagnosis Date  . Bipolar disorder   . Hypertension    Past Surgical History  Procedure Laterality Date  . Cholecystectomy    . Tubal ligation     No family history on file. History  Substance Use Topics  . Smoking status: Former Smoker    Types: Cigarettes  . Smokeless tobacco: Not on file  . Alcohol Use: No   OB History   Grav Para Term Preterm Abortions TAB SAB Ect Mult Living                 Review of Systems  All other systems reviewed and are negative.     Allergies  Codeine and Sulfa antibiotics  Home Medications   Prior to Admission medications   Medication Sig Start Date End Date Taking? Authorizing Provider  carbamazepine (TEGRETOL) 200 MG tablet Take 200 mg by mouth 2 (two) times daily.   Yes Historical Provider, MD  FLUoxetine (PROZAC) 40 MG capsule Take 40 mg by mouth every morning.   Yes Historical Provider, MD  lisinopril-hydrochlorothiazide (PRINZIDE,ZESTORETIC) 10-12.5 MG per tablet Take 1 tablet by mouth daily.   Yes Historical Provider, MD  lithium carbonate 300 MG capsule Take  600 mg by mouth at bedtime.   Yes Historical Provider, MD  OLANZapine (ZYPREXA) 20 MG tablet Take 20 mg by mouth daily.   Yes Historical Provider, MD  traZODone (DESYREL) 150 MG tablet Take 150 mg by mouth at bedtime.   Yes Historical Provider, MD   BP 143/96  Pulse 67  Temp(Src) 98 F (36.7 C) (Oral)  Resp 16  Wt 195 lb (88.451 kg)  SpO2 99% Physical Exam  Nursing note and vitals reviewed. Constitutional: She is oriented to person, place, and time. She appears well-developed and well-nourished. She appears distressed.  HENT:  Head: Normocephalic and atraumatic.  Eyes: Conjunctivae and EOM are normal. Pupils are equal, round, and reactive to light.  Neck: Normal range of motion and phonation normal. Neck supple.  Cardiovascular: Normal rate, regular rhythm and intact distal pulses.   Pulmonary/Chest: Effort normal and breath sounds normal. She exhibits no tenderness.  Abdominal: Soft. She exhibits no distension. There is no tenderness. There is no guarding.  Musculoskeletal: Normal range of motion.  Neurological: She is alert and oriented to person, place, and time. She exhibits normal muscle tone.  Skin: Skin is warm and dry.  Psychiatric: Her behavior is normal. Judgment and thought content normal.  She appears depressed, she is tearful    ED Course  Procedures (including critical care time)  TTS consultation  Move to psychiatric ED  Psychiatric: Holding Orders  written  Patient is medically cleared for evaluation and treatment by psychiatry- 14:00     Labs Review Labs Reviewed  COMPREHENSIVE METABOLIC PANEL - Abnormal; Notable for the following:    Potassium 3.1 (*)    Glucose, Bld 127 (*)    All other components within normal limits  URINE RAPID DRUG SCREEN (HOSP PERFORMED) - Abnormal; Notable for the following:    Cocaine POSITIVE (*)    All other components within normal limits  LITHIUM LEVEL - Abnormal; Notable for the following:    Lithium Lvl <0.25 (*)     All other components within normal limits  CBC  ETHANOL    Imaging Review No results found.   EKG Interpretation None      MDM   Final diagnoses:  Depression  Suicidal ideation  Noncompliance with medications    Depression, suicidal ideation and psychiatric medication noncompliance. She will need stabilization prior to consideration for discharge. She will likely need a psychiatry evaluation.  Nursing Notes Reviewed/ Care Coordinated, and agree without changes. Applicable Imaging Reviewed.  Interpretation of Laboratory Data incorporated into ED treatment  Plan: As per oncoming treatment team    Flint Melter, MD 07/01/13 [phone]

## 2013-07-01 NOTE — Progress Notes (Signed)
[facility] provided pt with a list of primary care resources. Patient stated that she was pending Medicaid.  °

## 2013-07-01 NOTE — ED Notes (Addendum)
Patient reports nausea, diarrhea, and vomiting for one week.  Patient reports she stopped taking bipolar meds a month ago due to moving.  She is trying to get on medicaid here and see a psychiatrist at the [facility].  She does not have an appointment yet because of her reported inability to pay.

## 2013-07-01 NOTE — ED Notes (Signed)
Pt to room 36. 

## 2013-07-02 ENCOUNTER — Encounter (HOSPITAL_COMMUNITY): Payer: Self-pay | Admitting: Behavioral Health

## 2013-07-02 ENCOUNTER — Inpatient Hospital Stay (HOSPITAL_COMMUNITY)
Admission: AD | Admit: 2013-07-02 | Discharge: 2013-07-11 | DRG: 885 | Disposition: A | Discharge status: Home / Self Care | Payer: Federal, State, Local not specified - Other | Source: Intra-hospital | Attending: Psychiatry | Admitting: Psychiatry

## 2013-07-02 DIAGNOSIS — I10 Essential (primary) hypertension: Secondary | ICD-10-CM | POA: Diagnosis present

## 2013-07-02 DIAGNOSIS — G4700 Insomnia, unspecified: Secondary | ICD-10-CM | POA: Diagnosis present

## 2013-07-02 DIAGNOSIS — R45851 Suicidal ideations: Secondary | ICD-10-CM

## 2013-07-02 DIAGNOSIS — F3130 Bipolar disorder, current episode depressed, mild or moderate severity, unspecified: Secondary | ICD-10-CM

## 2013-07-02 DIAGNOSIS — K5900 Constipation, unspecified: Secondary | ICD-10-CM | POA: Diagnosis present

## 2013-07-02 DIAGNOSIS — F1994 Other psychoactive substance use, unspecified with psychoactive substance-induced mood disorder: Secondary | ICD-10-CM | POA: Diagnosis present

## 2013-07-02 DIAGNOSIS — F1420 Cocaine dependence, uncomplicated: Secondary | ICD-10-CM | POA: Diagnosis present

## 2013-07-02 DIAGNOSIS — F319 Bipolar disorder, unspecified: Secondary | ICD-10-CM | POA: Diagnosis present

## 2013-07-02 DIAGNOSIS — F411 Generalized anxiety disorder: Secondary | ICD-10-CM | POA: Diagnosis present

## 2013-07-02 DIAGNOSIS — Z87891 Personal history of nicotine dependence: Secondary | ICD-10-CM

## 2013-07-02 MED ORDER — ACETAMINOPHEN 325 MG PO TABS
650.0000 mg | ORAL_TABLET | Freq: Four times a day (QID) | ORAL | Status: DC | PRN
  Administered 2013-07-04: 650 mg via ORAL
  Filled 2013-07-02: qty 2

## 2013-07-02 MED ORDER — HYDROCHLOROTHIAZIDE 12.5 MG PO CAPS
12.5000 mg | ORAL_CAPSULE | Freq: Every day | ORAL | Status: DC
  Administered 2013-07-03 – 2013-07-11 (×9): 12.5 mg via ORAL
  Filled 2013-07-02 (×11): qty 1

## 2013-07-02 MED ORDER — FLUOXETINE HCL 20 MG PO CAPS
40.0000 mg | ORAL_CAPSULE | Freq: Every morning | ORAL | Status: DC
  Administered 2013-07-03 – 2013-07-11 (×9): 40 mg via ORAL
  Filled 2013-07-02 (×8): qty 2
  Filled 2013-07-02: qty 28
  Filled 2013-07-02 (×3): qty 2

## 2013-07-02 MED ORDER — MAGNESIUM HYDROXIDE 400 MG/5ML PO SUSP
30.0000 mL | Freq: Every day | ORAL | Status: DC | PRN
  Administered 2013-07-07 – 2013-07-08 (×2): 30 mL via ORAL

## 2013-07-02 MED ORDER — LISINOPRIL 10 MG PO TABS
10.0000 mg | ORAL_TABLET | Freq: Every day | ORAL | Status: DC
  Administered 2013-07-03 – 2013-07-11 (×9): 10 mg via ORAL
  Filled 2013-07-02 (×11): qty 1

## 2013-07-02 MED ORDER — TRAZODONE HCL 150 MG PO TABS
150.0000 mg | ORAL_TABLET | Freq: Every day | ORAL | Status: DC
  Administered 2013-07-03 – 2013-07-06 (×4): 150 mg via ORAL
  Filled 2013-07-02 (×7): qty 1

## 2013-07-02 MED ORDER — ALUM & MAG HYDROXIDE-SIMETH 200-200-20 MG/5ML PO SUSP: 30.0000 mL | ORAL | Status: DC | PRN

## 2013-07-02 MED ORDER — OLANZAPINE 10 MG PO TABS
20.0000 mg | ORAL_TABLET | Freq: Every day | ORAL | Status: DC
  Administered 2013-07-03 – 2013-07-05 (×3): 20 mg via ORAL
  Filled 2013-07-02 (×5): qty 2

## 2013-07-02 MED ORDER — LITHIUM CARBONATE 300 MG PO CAPS
600.0000 mg | ORAL_CAPSULE | Freq: Every day | ORAL | Status: DC
  Administered 2013-07-03 – 2013-07-10 (×8): 600 mg via ORAL
  Filled 2013-07-02 (×4): qty 2
  Filled 2013-07-02 (×2): qty 28
  Filled 2013-07-02 (×7): qty 2

## 2013-07-02 MED ORDER — CARBAMAZEPINE 200 MG PO TABS
200.0000 mg | ORAL_TABLET | Freq: Two times a day (BID) | ORAL | Status: DC
  Administered 2013-07-02 – 2013-07-11 (×18): 200 mg via ORAL
  Filled 2013-07-02: qty 1
  Filled 2013-07-02: qty 28
  Filled 2013-07-02 (×2): qty 1
  Filled 2013-07-02: qty 28
  Filled 2013-07-02 (×18): qty 1

## 2013-07-02 NOTE — Progress Notes (Signed)
Pt was given [employer] packet, Developing wellness toolbox and worksheet for psycho-educational materials.

## 2013-07-02 NOTE — Consult Note (Signed)
[facility] Face-to-Face Psychiatry Consult   Reason for Consult:  Suicidal ideation and intent Referring Physician:  ER MD  Marisa Sherman is an 53 y.o. female. Total Time spent with patient: 45 minutes  Assessment: AXIS I:  Bipolar, Depressed AXIS II:  Deferred AXIS III:   Past Medical History  Diagnosis Date  . Bipolar disorder   . Hypertension    AXIS IV:  chronic mental illness AXIS V:  41-50 serious symptoms  Plan:  Recommend psychiatric Inpatient admission when medically cleared.  Subjective:   Marisa Sherman is a 53 y.o. female patient admitted with suicidal ideation and intent.  HPI:  Ms Gainey says she has been diagnosed with Bipolar disorder and her medications work, but she moved to [place] one month ago and has not found a new doctor to prescribe her meds and has been out of them for a month.  About a week ago she started getting more depressed and having suicidal thoughts that have worsened to the point she is afraid she will taking an overdose.  She has a history of suicidal attempts.She moved here to be with her boyfriend who is supportive. HPI Elements:   Location:  suicidal ideation. Quality:  afraid she will act on the thoughts. Severity:  daily suicidal thoughts. Timing:  off meds for a month. Duration:  years of bipolar disorder. Context:  as above.  Past Psychiatric History: Past Medical History  Diagnosis Date  . Bipolar disorder   . Hypertension     reports that she has quit smoking. Her smoking use included Cigarettes. She smoked 0.00 packs per day. She does not have any smokeless tobacco history on file. She reports that she does not drink alcohol. Her drug history is not on file. No family history on file.         Allergies:   Allergies  Allergen Reactions  . Codeine Nausea Only  . Sulfa Antibiotics Nausea And Vomiting    ACT Assessment Complete:  No:   Past Psychiatric History: Diagnosis:  Bipolar disorder I depressed   Hospitalizations:   Several times in the past   Outpatient Care:  None since she moved to [facility]  Substance Abuse Care:  none  Self-Mutilation:  none  Suicidal Attempts:  Several in the past, most recent about 2 years ago  Homicidal Behaviors:  none   Violent Behaviors:  none   Place of Residence:  Lives with her boufriend Marital Status:  single Employed/Unemployed:  unemployed Education:  Did not ask Family Supports:  none Objective: Blood pressure 96/63, pulse 68, temperature 98 F (36.7 C), temperature source Oral, resp. rate 18, weight 88.451 kg (195 lb), SpO2 96.00%.There is no height on file to calculate BMI. Results for orders placed during the hospital encounter of 07/01/13 (from the past 72 hour(s))  CBC     Status: None   Collection Time    07/01/13  1:39 PM      Result Value Ref Range   WBC 8.9  4.0 - 10.5 K/uL   RBC 4.67  3.87 - 5.11 MIL/uL   Hemoglobin 14.1  12.0 - 15.0 g/dL   HCT 39.5  36.0 - 46.0 %   MCV 84.6  78.0 - 100.0 fL   MCH 30.2  26.0 - 34.0 pg   MCHC 35.7  30.0 - 36.0 g/dL   RDW 12.3  11.5 - 15.5 %   Platelets 262  150 - 400 K/uL  COMPREHENSIVE METABOLIC PANEL     Status: Abnormal  Collection Time    07/01/13  1:39 PM      Result Value Ref Range   Sodium 144  137 - 147 mEq/L   Potassium 3.1 (*) 3.7 - 5.3 mEq/L   Chloride 104  96 - 112 mEq/L   CO2 26  19 - 32 mEq/L   Glucose, Bld 127 (*) 70 - 99 mg/dL   BUN 6  6 - 23 mg/dL   Creatinine, Ser 0.63  0.50 - 1.10 mg/dL   Calcium 9.5  8.4 - 10.5 mg/dL   Total Protein 7.4  6.0 - 8.3 g/dL   Albumin 3.9  3.5 - 5.2 g/dL   AST 15  0 - 37 U/L   ALT 21  0 - 35 U/L   Alkaline Phosphatase 86  39 - 117 U/L   Total Bilirubin 0.3  0.3 - 1.2 mg/dL   GFR calc non Af Amer >90  >90 mL/min   GFR calc Af Amer >90  >90 mL/min   Comment: (NOTE)     The eGFR has been calculated using the CKD EPI equation.     This calculation has not been validated in all clinical situations.     eGFR's persistently <90 mL/min signify possible Chronic  Kidney     Disease.  ETHANOL     Status: None   Collection Time    07/01/13  1:39 PM      Result Value Ref Range   Alcohol, Ethyl (B) <11  0 - 11 mg/dL   Comment:            LOWEST DETECTABLE LIMIT FOR     SERUM ALCOHOL IS 11 mg/dL     FOR MEDICAL PURPOSES ONLY  URINE RAPID DRUG SCREEN (HOSP PERFORMED)     Status: Abnormal   Collection Time    07/01/13  1:51 PM      Result Value Ref Range   Opiates NONE DETECTED  NONE DETECTED   Cocaine POSITIVE (*) NONE DETECTED   Benzodiazepines NONE DETECTED  NONE DETECTED   Amphetamines NONE DETECTED  NONE DETECTED   Tetrahydrocannabinol NONE DETECTED  NONE DETECTED   Barbiturates NONE DETECTED  NONE DETECTED   Comment:            DRUG SCREEN FOR MEDICAL PURPOSES     ONLY.  IF CONFIRMATION IS NEEDED     FOR ANY PURPOSE, NOTIFY LAB     WITHIN 5 DAYS.                LOWEST DETECTABLE LIMITS     FOR URINE DRUG SCREEN     Drug Class       Cutoff (ng/mL)     Amphetamine      1000     Barbiturate      200     Benzodiazepine   458     Tricyclics       099     Opiates          300     Cocaine          300     THC              50  LITHIUM LEVEL     Status: Abnormal   Collection Time    07/01/13  2:17 PM      Result Value Ref Range   Lithium Lvl <0.25 (*) 0.80 - 1.40 mEq/L   Labs are reviewed and are pertinent for cocaine which  she did once which she regrets.  She has already received potassium for her low potassium .  Current Facility-Administered Medications  Medication Dose Route Frequency Provider Last Rate Last Dose  . carbamazepine (TEGRETOL) tablet 200 mg  200 mg Oral BID Richarda Blade, MD   200 mg at 07/02/13 1028  . FLUoxetine (PROZAC) capsule 40 mg  40 mg Oral q morning - 10a Richarda Blade, MD   40 mg at 07/02/13 1028  . lisinopril (PRINIVIL,ZESTRIL) tablet 10 mg  10 mg Oral Daily Richarda Blade, MD   10 mg at 07/02/13 1029   And  . hydrochlorothiazide (MICROZIDE) capsule 12.5 mg  12.5 mg Oral Daily Richarda Blade, MD    12.5 mg at 07/02/13 1031  . lithium carbonate capsule 600 mg  600 mg Oral QHS Richarda Blade, MD   600 mg at 07/01/13 2144  . OLANZapine (ZYPREXA) tablet 20 mg  20 mg Oral Daily Richarda Blade, MD   20 mg at 07/02/13 1028  . traZODone (DESYREL) tablet 150 mg  150 mg Oral QHS Richarda Blade, MD   150 mg at 07/01/13 2144   Current Outpatient Prescriptions  Medication Sig Dispense Refill  . carbamazepine (TEGRETOL) 200 MG tablet Take 200 mg by mouth 2 (two) times daily.      Marland Kitchen FLUoxetine (PROZAC) 40 MG capsule Take 40 mg by mouth every morning.      Marland Kitchen lisinopril-hydrochlorothiazide (PRINZIDE,ZESTORETIC) 10-12.5 MG per tablet Take 1 tablet by mouth daily.      Marland Kitchen lithium carbonate 300 MG capsule Take 600 mg by mouth at bedtime.      Marland Kitchen OLANZapine (ZYPREXA) 20 MG tablet Take 20 mg by mouth daily.      . traZODone (DESYREL) 150 MG tablet Take 150 mg by mouth at bedtime.        Psychiatric Specialty Exam:     Blood pressure 96/63, pulse 68, temperature 98 F (36.7 C), temperature source Oral, resp. rate 18, weight 88.451 kg (195 lb), SpO2 96.00%.There is no height on file to calculate BMI.  General Appearance: Casual  Eye Contact::  Good  Speech:  Clear and Coherent  Volume:  Normal  Mood:  Depressed  Affect:  Appropriate  Thought Process:  Coherent  Orientation:  Full (Time, Place, and Person)  Thought Content:  Negative  Suicidal Thoughts:  Yes.  with intent/plan  Homicidal Thoughts:  No  Memory:  Immediate;   Good Recent;   Good Remote;   Good  Judgement:  Good  Insight:  Fair  Psychomotor Activity:  Normal  Concentration:  Good  Recall:  Good  Fund of Knowledge:Good  Language: Good  Akathisia:  Negative  Handed:  Right  AIMS (if indicated):     Assets:  Communication Skills Desire for [place] Support  Sleep:      Musculoskeletal: Strength & Muscle Tone: within normal limits Gait & Station: normal Patient leans:  N/A  Treatment Plan Summary: Daily contact with patient to assess and evaluate symptoms and progress in treatment Medication management has a bed at [facility] for treatment of her depression and suicidal as part of her bipolar disorder and will be transferred today.  Clarene Reamer 07/02/2013 11:40 AM

## 2013-07-02 NOTE — Progress Notes (Signed)
Patient in bed during this assessment. Mood and affect flat and depressed. She reported feeling tired and did not attend group. She denied SI/HI and denied Hallucinations. Writer encouraged and supported patient. Q 15 minute check continues as ordered to maintain safety.
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
## 2013-07-02 NOTE — Progress Notes (Signed)
Pt accepted to [facility] by [facility] to Dr. Jama Flavors. Pt to be transported voluntarily by [facility]. Support paperwork completed and faxed.   Byrd Hesselbach [phone]  ED CSW 07/02/2013 1128am
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
## 2013-07-02 NOTE — BHH Counselor (Signed)
As of 0630am, TTS consult has not been called in to [facility].  This Clinical research associate contacted [facility] and pt had not been placed on TTS log.

## 2013-07-02 NOTE — Progress Notes (Signed)
Recreation Therapy Notes  Animal-Assisted Activity/Therapy (AAA/T) Program Checklist/Progress Notes Patient Eligibility Criteria Checklist & Daily Group note for Rec Tx Intervention  Date: 06.09.2015 Time: 2:45pm Location: [address] Hall Dayroom    AAA/T Program Assumption of Risk Form signed by Patient/ or Parent Legal Guardian yes  Patient is free of allergies or sever asthma yes  Patient reports no fear of animals yes  Patient reports no history of cruelty to animals yes   Patient understands his/her participation is voluntary yes  Patient washes hands before animal contact yes  Patient washes hands after animal contact yes  Behavioral Response: Appropriate   Education: Hand Washing, Appropriate Animal Interaction   Education Outcome: Acknowledges understanding  Clinical Observations/Feedback: Patient interacted appropriately with therapeutic dog team and peers.    Caragh Gasper L Catherina Pates, LRT/CTRS         Rocio Wolak L Raffi Milstein 07/02/2013 4:12 PM 

## 2013-07-02 NOTE — Progress Notes (Signed)
Adult Psychoeducational Group Note  Date:  07/02/2013 Time:  8:53 PM  Group Topic/Focus:  Wrap-Up Group:   The focus of this group is to help patients review their daily goal of treatment and discuss progress on daily workbooks.  Participation Level:  Did Not Attend  Participation Quality:  Drowsy  Affect:  Flat  Cognitive:  Confused  Insight: None  Engagement in Group:  None  Modes of Intervention:  Support  Additional Comments:  PT chose to stay in bedroom, PT said that didn't want to go to group would rather stay in the bedroom   Nash Bolls R Mykle Pascua 07/02/2013, 8:53 PM

## 2013-07-02 NOTE — BHH Group Notes (Signed)
BHH LCSW Group Therapy  07/02/2013 1:15 PM   Type of Therapy:  Group Therapy  Participation Level:  Did Not Attend - pt resting in her room and is a new admit  Reyes Ivan, LCSW 07/02/2013 2:17 PM

## 2013-07-03 ENCOUNTER — Encounter (HOSPITAL_COMMUNITY): Payer: Self-pay | Admitting: Psychiatry

## 2013-07-03 DIAGNOSIS — F1910 Other psychoactive substance abuse, uncomplicated: Secondary | ICD-10-CM

## 2013-07-03 DIAGNOSIS — F3130 Bipolar disorder, current episode depressed, mild or moderate severity, unspecified: Principal | ICD-10-CM

## 2013-07-03 DIAGNOSIS — F1994 Other psychoactive substance use, unspecified with psychoactive substance-induced mood disorder: Secondary | ICD-10-CM

## 2013-07-03 DIAGNOSIS — R45851 Suicidal ideations: Secondary | ICD-10-CM

## 2013-07-03 MED ORDER — PNEUMOCOCCAL VAC POLYVALENT 25 MCG/0.5ML IJ INJ
0.5000 mL | INJECTION | INTRAMUSCULAR | Status: AC
  Administered 2013-07-04: 0.5 mL via INTRAMUSCULAR

## 2013-07-03 NOTE — BHH Suicide Risk Assessment (Signed)
B[facility] INPATIENT:  Family/Significant Other Suicide Prevention Education  Suicide Prevention Education:  Education Completed; Marisa Sherman - boyfriend [phone]),  (name of family member/significant other) has been identified by the patient as the family member/significant other with whom the patient will be residing, and identified as the person(s) who will aid the patient in the event of a mental health crisis (suicidal ideations/suicide attempt).  With written consent from the patient, the family member/significant other has been provided the following suicide prevention education, prior to the and/or following the discharge of the patient.  The suicide prevention education provided includes the following:  Suicide risk factors  Suicide prevention and interventions  National Suicide Hotline telephone number  [facility] assessment telephone number  [facility] Emergency Assistance 911  [facility] and/or Residential Mobile Crisis Unit telephone number  Request made of family/significant other to:  Remove weapons (e.g., guns, rifles, knives), all items previously/currently identified as safety concern.    Remove drugs/medications (over-the-counter, prescriptions, illicit drugs), all items previously/currently identified as a safety concern.  The family member/significant other verbalizes understanding of the suicide prevention education information provided.  The family member/significant other agrees to remove the items of safety concern listed above.  Boyfriend feels like pt sleeps too much at home and wonders if it's the medication causing this.    Marisa Sherman 07/03/2013, 12:53 PM

## 2013-07-03 NOTE — BHH Group Notes (Signed)
[facility] LCSW Group Therapy  07/03/2013 1:15 PM   Type of Therapy:  Group Therapy  Participation Level:  Did Not Attend - pt sleeping in her room  Reyes Ivan, LCSW 07/03/2013 3:15 PM

## 2013-07-03 NOTE — BHH Group Notes (Signed)
[facility] LCSW Aftercare Discharge Planning Group Note   07/03/2013  8:45 AM  Participation Quality:  Did Not Attend - pt came to group but left before it started and didn't return  Reyes Ivan, LCSW 07/03/2013 9:55 AM
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
## 2013-07-03 NOTE — Progress Notes (Signed)
D: Patient presents with appropriate affect and pleasant mood. She reported on the self inventory sheet that she's sleeping fair, good appetite, low energy level and improving ability to pay attention. Patient rates depression and feelings of hopelessness "4". She has not been participating in groups today. Writer observed that the patient has been lying in bed the majority of the day. Patient compliant with current medication regimen.  A: Support and encouragement provided to patient. Scheduled medications administered per MD orders. Maintain Q15 minute checks for safety.  R: Patient receptive. Passive SI, but contracts for safety. Denies HI and AVH. Patient remains safe.

## 2013-07-03 NOTE — Progress Notes (Signed)
Patient ID: Marisa Sherman, female   DOB: [birth year], 53 y.o.   MRN: [id number] D: pt. Lying in bed, but up to speak with writer, reports depression at "4" of 10. Pt. Says she move here from [place] to be with her BF. "ran out of medicine since moving here, trying to get established" Pt. Currently denies SHI.  A: Writer introduced self provided emotional support by listening, encouraged group. Staff will monitor q45min for safety. R: pt. Does not attend group, but gets up for snacks, noted interacting with others. Pt. Is safe on the unit.

## 2013-07-04 NOTE — Progress Notes (Signed)
D: Patient continues to have appropriate affect, but depressed mood. Declined self inventory sheet today. Writer observed that the patient has been lying in bed all day. For the past two days, she has not attended any of the scheduled groups. Adheres to all medications of her regimen.  A: Support and encouragement provided to patient. Administered scheduled medications per ordering MD. Monitor Q15 minute checks for safety.  R: Patient receptive. Passive SI, but contracts for safety. Denies HI. Patient remains safe on the unit.

## 2013-07-04 NOTE — Progress Notes (Signed)
  Adult Psychoeducational Group Note  Date:  07/04/2013 Time:  10:00AM  Group Topic/Focus:  Overcoming Stress:   The focus of this group is to define stress and help patients assess their triggers.  Participation Level:  Active  Participation Quality:  Appropriate  Affect:  Appropriate  Cognitive:  Appropriate  Insight: Appropriate  Engagement in Group:  Engaged  Modes of Intervention:  Discussion  Additional Comments:  Pt participated in coming up with 5 coping skills to deal with stress. Her coping skill was taking a nap and going on a walk.  Hortencia Pilar, Sedonia Kitner E 07/04/2013, 10:33 AM

## 2013-07-04 NOTE — Progress Notes (Signed)
Marisa Sherman, Marisa Grande MD Progress Note  07/04/2013 2:39 PM Marisa Sherman  MRN:  [id number] Subjective:  Describes some improvement Objective- patient states she carries a diagnosis of Bipolar Disorder, but had been stable on her medications , which included Zyprexa, Tegretol, Lithium and Prozac - of note states she was tolerating this regimen well and is not endorsing side effects from it .  Had stopped her medications one month ago and was becoming progressively more depressed.  Diagnosis:    AXIS I: Bipolar, Depressed, Substance Abuse and Substance Induced Mood Disorder  AXIS II: Deferred  AXIS III:  Past Medical History   Diagnosis  Date   .  Bipolar disorder    .  Hypertension     AXIS IV: other psychosocial or environmental problems and problems related to social environment  AXIS V: 45   Total Time spent with patient: 20 minutes   ADL's:  Improving   Sleep: good   Appetite:  Good  Suicidal Ideation:  Denies any current SI  Homicidal Ideation:  denies AEB (as evidenced by):  Psychiatric Specialty Exam: Physical Exam  Review of Systems  Constitutional: Negative for fever and chills.  Respiratory: Negative for cough and shortness of breath.   Cardiovascular: Negative for chest pain.  Gastrointestinal: Negative for vomiting.  Psychiatric/Behavioral: Positive for depression.    Blood pressure 107/77, pulse 78, temperature 97.2 F (36.2 C), temperature source Oral, resp. rate 20, height 5\' 5"  (1.651 m), weight 85.276 kg (188 lb).Body mass index is 31.28 kg/(m^2).  General Appearance: Fairly Groomed  Patent attorney::  Good  Speech:  Normal Rate  Volume:  Normal  Mood:  Depressed  Affect:  Constricted but reactive   Thought Process:  Linear  Orientation:  Negative no evidence of confusion or delirium  Thought Content:  denies psychotic symptoms currently  Suicidal Thoughts:  No- at this time denies SI   Homicidal Thoughts:  Denies   Memory:  NA  Judgement:  Fair  Insight:  Fair   Psychomotor Activity:  Normal  Concentration:  NA  Recall:  Good  Fund of Knowledge:Good  Language: Good  Akathisia:  No  Handed:  Right  AIMS (if indicated):     Assets:  Desire for Improvement Resilience  Sleep:  Number of Hours: 6.75   Musculoskeletal: Strength & Muscle Tone: within normal limits Gait & Station: normal Patient leans: N/A  Current Medications: Current Facility-Administered Medications  Medication Dose Route Frequency Provider Last Rate Last Dose  . acetaminophen (TYLENOL) tablet 650 mg  650 mg Oral Q6H PRN Benjaman Pott, MD   650 mg at 07/04/13 0951  . alum & mag hydroxide-simeth (MAALOX/MYLANTA) 200-200-20 MG/5ML suspension 30 mL  30 mL Oral Q4H PRN Benjaman Pott, MD      . carbamazepine (TEGRETOL) tablet 200 mg  200 mg Oral BID Benjaman Pott, MD   200 mg at 07/04/13 0829  . FLUoxetine (PROZAC) capsule 40 mg  40 mg Oral q morning - 10a Benjaman Pott, MD   40 mg at 07/04/13 0949  . lisinopril (PRINIVIL,ZESTRIL) tablet 10 mg  10 mg Oral Daily Benjaman Pott, MD   10 mg at 07/04/13 5631   And  . hydrochlorothiazide (MICROZIDE) capsule 12.5 mg  12.5 mg Oral Daily Benjaman Pott, MD   12.5 mg at 07/04/13 0829  . lithium carbonate capsule 600 mg  600 mg Oral QHS Benjaman Pott, MD   600 mg at 07/03/13 2150  . magnesium hydroxide (MILK  OF MAGNESIA) suspension 30 mL  30 mL Oral Daily PRN Benjaman PottGerald D Taylor, MD      . OLANZapine [facility](ZYPREXA) tablet 20 mg  20 mg Oral Daily Benjaman PottGerald D Taylor, MD   20 mg at 07/04/13 0829  . traZODone (DESYREL) tablet 150 mg  150 mg Oral QHS Benjaman PottGerald D Taylor, MD   150 mg at 07/03/13 2150    Lab Results: No results found for this or any previous visit (from the past 48 hour(s)).  Physical Findings: AIMS: Facial and Oral Movements Muscles of Facial Expression: None, normal Lips and Perioral Area: None, normal Jaw: None, normal Tongue: None, normal,Extremity Movements Upper (arms, wrists, hands, fingers): None, normal Lower (legs,  knees, ankles, toes): None, normal, Trunk Movements Neck, shoulders, hips: None, normal, Overall Severity Severity of abnormal movements (highest score from questions above): None, normal Incapacitation due to abnormal movements: None, normal Patient's awareness of abnormal movements (rate only patient's report): No Awareness, Dental Status Current problems with teeth and/or dentures?: No Does patient usually wear dentures?: Yes  CIWA:    COWS:     Assessment- History of Bipolar Disorder, more recently depressed in the context of medication non compliance x 1 month- now restarted on her medication regimen, which includes lithium, tegretol, Zyprexa. Patient states this regimen was effective and well tolerated and is not endorsing any current side effects. Currently improving , although still depressed. Denies any current SI   Treatment Plan Summary: Daily contact with patient to assess and evaluate symptoms and progress in treatment Medication management for now continue current regimen.   Plan: As above, and continue inpatient support, milieu, group therapy.  Medical Decision Making Problem Points:  Established problem, stable/improving (1) Data Points:  Review of new medications or change in dosage (2)  I certify that inpatient services furnished can reasonably be expected to improve the patient's condition.   COBOS, FERNANDO 07/04/2013, 2:39 PM

## 2013-07-04 NOTE — [facility] Group Notes (Signed)
[facility] LCSW Group Therapy  [facility]   07/04/2013 2:42 PM  Type of Therapy:  Group Therapy  Participation Level:  Did Not Attend :  Wynn Banker 07/04/2013, 2:42 PM

## 2013-07-04 NOTE — Progress Notes (Signed)
Patient ID: Marisa Sherman, female   DOB: [birth year], 53 y.o.   MRN: [id number] D: pt. In bed, reports depression at "5". A: Writer encouraged pt. To get up interact, attend group. Provided emotional support. Staff will monitor q69min for safety. R: pt. Is safe on the unit, did not attend karaoke, "I'm not going"

## 2013-07-04 NOTE — Progress Notes (Signed)
Adult Psychoeducational Group Note  Date:  07/04/2013 Time:  9:00 AM  Group Topic/Focus:  Morning Wellness Group  Participation Level:  Did Not Attend  Additional Comments: Patient was lying in bed during group.  Marisa Sherman E 07/04/2013, 4:25 PM

## 2013-07-05 LAB — BASIC METABOLIC PANEL
BUN: 10 mg/dL (ref 6–23)
CALCIUM: 9.5 mg/dL (ref 8.4–10.5)
CHLORIDE: 104 meq/L (ref 96–112)
CO2: 29 meq/L (ref 19–32)
Creatinine, Ser: 0.73 mg/dL (ref 0.50–1.10)
GFR calc Af Amer: >90 mL/min (ref >90)
GFR calc non Af Amer: >90 mL/min (ref >90)
Glucose, Bld: 89 mg/dL (ref 70–99)
Potassium: 3.9 mEq/L (ref 3.7–5.3)
Sodium: 142 mEq/L (ref 137–147)

## 2013-07-05 LAB — TSH: TSH: 1.72 u[IU]/mL (ref 0.350–4.500)

## 2013-07-05 MED ORDER — OLANZAPINE 10 MG PO TABS
10.0000 mg | ORAL_TABLET | Freq: Every day | ORAL | Status: DC
  Administered 2013-07-06 – 2013-07-11 (×6): 10 mg via ORAL
  Filled 2013-07-05 (×2): qty 1
  Filled 2013-07-05: qty 14
  Filled 2013-07-05 (×5): qty 1

## 2013-07-05 NOTE — Progress Notes (Signed)
Patient ID: Marisa Sherman, female   DOB: 1960-09-25, 53 y.o.   MRN: [id number] [facility] MD Progress Note  07/05/2013 3:32 PM Marisa Sherman  MRN:  [id number] Subjective: " Better today" Objective: I have discussed case with treatment team and have met with patient. Patient states she is doing "OK". She feels better. Tolerating medications well- as noted, she was on these medications prior to her move to [place], and felt they were helping, but ran out of them. Today we spent time reviewing side effect profile for medications. As noted, she is on several psychiatric medications.  As discussed with staff some group participation, but has spent a lot of time in her room and motivation in milieu activities is just starting to increase. No overtly disruptive behaviors.   Diagnosis:    AXIS I: Bipolar, Depressed, Substance Abuse and Substance Induced Mood Disorder  AXIS II: Deferred  AXIS III:  Past Medical History   Diagnosis  Date   .  Bipolar disorder    .  Hypertension     AXIS IV: other psychosocial or environmental problems and problems related to social environment  AXIS V: 45   Total Time spent with patient: 20 minutes   ADL's:  Improving   Sleep: good   Appetite:  Good  Suicidal Ideation:  Denies any current SI  Homicidal Ideation:  denies AEB (as evidenced by):  Psychiatric Specialty Exam: Physical Exam  Review of Systems  Constitutional: Negative for fever and chills.  Respiratory: Negative for shortness of breath.   Cardiovascular: Negative for chest pain.  Gastrointestinal: Negative for vomiting.  Psychiatric/Behavioral: Positive for depression. Negative for suicidal ideas and hallucinations.    Blood pressure 109/69, pulse 75, temperature 98.2 F (36.8 C), temperature source Oral, resp. rate 17, height _0  (1.651 m), weight 85.276 kg (188 lb).Body mass index is 31.28 kg/(m^2).  General Appearance: Fairly Groomed  Engineer, water::  Good  Speech:  Normal Rate   Volume:  Normal  Mood: still depressed but reported as improving  Affect: reactive   Thought Process:  Linear  Orientation:  Negative no evidence of confusion or delirium  Thought Content:  denies psychotic symptoms currently  Suicidal Thoughts:  No SI at this time - contracts for safety on unit    Homicidal Thoughts:  Denies   Memory:  NA  Judgement:  Fair  Insight:  Fair  Psychomotor Activity:  Normal  Concentration:  NA  Recall:  Good  Fund of Knowledge:Good  Language: Good  Akathisia:  No  Handed:  Right  AIMS (if indicated):     Assets:  Desire for Improvement Resilience  Sleep:  Number of Hours: 6.75   Musculoskeletal: Strength & Muscle Tone: within normal limits Gait & Station: normal Patient leans: N/A  Current Medications: Current Facility-Administered Medications  Medication Dose Route Frequency Provider Last Rate Last Dose  . acetaminophen (TYLENOL) tablet 650 mg  650 mg Oral Q6H PRN Clarene Reamer, MD   650 mg at 07/04/13 0951  . alum & mag hydroxide-simeth (MAALOX/MYLANTA) 200-200-20 MG/5ML suspension 30 mL  30 mL Oral Q4H PRN Clarene Reamer, MD      . carbamazepine (TEGRETOL) tablet 200 mg  200 mg Oral BID Clarene Reamer, MD   200 mg at 07/05/13 8546  . FLUoxetine (PROZAC) capsule 40 mg  40 mg Oral q morning - 10a Clarene Reamer, MD   40 mg at 07/05/13 0936  . lisinopril (PRINIVIL,ZESTRIL) tablet 10 mg  10 mg Oral  Daily Clarene Reamer, MD   10 mg at 07/05/13 0809   And  . hydrochlorothiazide (MICROZIDE) capsule 12.5 mg  12.5 mg Oral Daily Clarene Reamer, MD   12.5 mg at 07/05/13 9826  . lithium carbonate capsule 600 mg  600 mg Oral QHS Clarene Reamer, MD   600 mg at 07/04/13 2217  . magnesium hydroxide (MILK OF MAGNESIA) suspension 30 mL  30 mL Oral Daily PRN Clarene Reamer, MD      . OLANZapine [facility]) tablet 20 mg  20 mg Oral Daily Clarene Reamer, MD   20 mg at 07/05/13 4158  . traZODone (DESYREL) tablet 150 mg  150 mg Oral QHS Clarene Reamer, MD    150 mg at 07/04/13 2217    Lab Results:  Results for orders placed during the hospital encounter of 07/02/13 (from the past 48 hour(s))  TSH     Status: None   Collection Time    07/05/13  6:25 AM      Result Value Ref Range   TSH 1.720  0.350 - 4.500 uIU/mL   Comment: Performed at [facility] PANEL     Status: None   Collection Time    07/05/13  6:25 AM      Result Value Ref Range   Sodium 142  137 - 147 mEq/L   Potassium 3.9  3.7 - 5.3 mEq/L   Chloride 104  96 - 112 mEq/L   CO2 29  19 - 32 mEq/L   Glucose, Bld 89  70 - 99 mg/dL   BUN 10  6 - 23 mg/dL   Creatinine, Ser 0.73  0.50 - 1.10 mg/dL   Calcium 9.5  8.4 - 10.5 mg/dL   GFR calc non Af Amer >90  >90 mL/min   GFR calc Af Amer >90  >90 mL/min   Comment: (NOTE)     The eGFR has been calculated using the CKD EPI equation.     This calculation has not been validated in all clinical situations.     eGFR's persistently <90 mL/min signify possible Chronic Kidney     Disease.     Performed at [facility]    Physical Findings: AIMS: Facial and Oral Movements Muscles of Facial Expression: None, normal Lips and Perioral Area: None, normal Jaw: None, normal Tongue: None, normal,Extremity Movements Upper (arms, wrists, hands, fingers): None, normal Lower (legs, knees, ankles, toes): None, normal, Trunk Movements Neck, shoulders, hips: None, normal, Overall Severity Severity of abnormal movements (highest score from questions above): None, normal Incapacitation due to abnormal movements: None, normal Patient's awareness of abnormal movements (rate only patient's report): No Awareness, Dental Status Current problems with teeth and/or dentures?: No Does patient usually wear dentures?: Yes  CIWA:    COWS:     Assessment-  Improving mood and affect, but still depressed and milieu participation limited. Tolerating medications well- she is on a combination of psychiatric medications  and describes a desire to simplify her medication regimen gradually. We reviewed side effects- she wants to taper down Zyprexa.  Of note labs reviewed- TSH and BMP WNL  Treatment Plan Summary: Continue treatment as above, but decrease Zyprexa to 10 mgrs QHS. Will follow   Plan: As above, and continue inpatient support, milieu, group therapy.  Medical Decision Making Problem Points:  Established problem, stable/improving (1) Data Points:  Review of medication regiment & side effects (2) Review of new medications or change in  dosage (2)  I certify that inpatient services furnished can reasonably be expected to improve the patient's condition.   Deondrick Searls, [place] 07/05/2013, 3:32 PM

## 2013-07-05 NOTE — Progress Notes (Signed)
Adult Psychoeducational Group Note  Date:  07/05/2013 Time:  8:57 PM  Group Topic/Focus:  Wrap-Up Group:   The focus of this group is to help patients review their daily goal of treatment and discuss progress on daily workbooks.  Participation Level:  Minimal  Participation Quality:  Attentive  Affect:  Appropriate  Cognitive:  Alert  Insight: Lacking  Engagement in Group:  Engaged  Modes of Intervention:  Discussion, Education, Exploration, Socialization and Support  Additional Comments:  Pt came to group and shared that she had met her goal today of attending more groups.   Rogene Houston 07/05/2013, 8:57 PM

## 2013-07-05 NOTE — Progress Notes (Signed)
Patient ID: Marisa Sherman, female   DOB: 1960/05/14, 53 y.o.   MRN: [id number] Pt with limited visibility in the milieu.  Pt remained in bed most of the evening.  Pt reported feelings of depression but was unable to identify stressor.  Support provided.  Pt receptive.  Needs assessed.  Pt denied.  Fifteen minute checks in progress for patient safety.  Pt safe on unit.

## 2013-07-05 NOTE — [facility] Group Notes (Signed)
[facility] LCSW Group Therapy  07/05/2013 1:15 PM   Type of Therapy:  Group Therapy  Participation Level:  Did Not Attend - pt said that she was coming to group but never arrived, pt sleeping in her room later  Reyes IvanChelsea Horton, LCSW 07/05/2013 3:04 PM

## 2013-07-05 NOTE — [facility] Group Notes (Signed)
[facility]   07/05/2013 8:45 AM  Participation Quality:  Alert, Appropriate and Oriented  Mood/Affect:  Flat and Depressed  Depression Rating:  7  Anxiety Rating:  6  Thoughts of Suicide:  Pt denies SI/HI  Will you contract for safety?   Yes  Current AVH:  Pt denies  Plan for Discharge/Comments:  Pt attended discharge planning group and actively participated in group.  CSW provided pt with today's workbook.  Pt reports feeling okay today.  [place] and has [facility] medication management and therapy.  No further needs voiced by pt at this time.    Transportation Means: Pt reports access to transportation - boyfriend will pick pt up  Supports: No supports mentioned at this time  Marisa IvanChelsea Horton, LCSW 07/05/2013 10:00 AM

## 2013-07-05 NOTE — Tx Team (Signed)
Interdisciplinary Treatment Plan Update (Adult)  Date: 07/05/2013  Time Reviewed:  9:45 AM  Progress in Treatment: Attending groups: Yes Participating in groups:  Yes Taking medication as prescribed:  Yes Tolerating medication:  Yes Family/Significant othe contact made: Yes, with pt's boyfriend Patient understands diagnosis:  Yes Discussing patient identified problems/goals with staff:  Yes Medical problems stabilized or resolved:  Yes Denies suicidal/homicidal ideation: Yes Issues/concerns per patient self-inventory:  Yes Other:  New problem(s) identified: N/A  Discharge Plan or Barriers: Pt [facility] medication management and therapy.    Reason for Continuation of Hospitalization: Anxiety Depression Medication Stabilization  Comments: N/A  Estimated length of stay: 2-3 days   For review of initial/current patient goals, please see plan of care.  Attendees: Patient:      Family:     Physician:  Dr. Jama Flavorsobos 07/05/2013 10:09 AM   Nursing:   Harold Barbanonecia Byrd, RN 07/05/2013 10:09 AM   Clinical Social Worker:  Reyes Ivanhelsea Horton, LCSW 07/05/2013 10:09 AM   Other: Shelda JakesPatty Duke, RN 07/05/2013 10:09 AM   Other:  Sherrye PayorValerie Noch, care coordination 07/05/2013 10:09 AM   Other:  Juline PatchQuylle Hodnett, LCSW 07/05/2013 10:09 AM   Other:  Quintella ReichertBeverly Knight, RN 07/05/2013 10:09 AM   Other: Onnie BoerJennifer Clark, UR case manager 07/05/2013 10:09 AM   Other:    Other:    Other:    Other:      Scribe for Treatment Team:   Carmina MillerHorton, Alexyia Guarino Nicole, 07/05/2013 , 10:09 AM

## 2013-07-05 NOTE — Progress Notes (Signed)
D: Patient's affect appropriate to situation and mood is depressed. She reported on the self inventory sheet that she's sleeping fair, appetite is good, low energy level and improving ability to pay attention. Patient rated depression "7" and feelings of hopelessness "6". The patient has been encouraged several times to participate in the scheduled groups, but today she has only participated in the discharge planning group. Patient compliant with current medication regimen.  A: Support and encouragement provided to patient. Scheduled medications administered per MD orders. Maintain Q15 minute checks for safety.  R: Patient receptive. Passive SI, but contracts for safety. Denies HI. Patient remains safe.

## 2013-07-06 MED ORDER — LAMOTRIGINE 25 MG PO TABS
50.0000 mg | ORAL_TABLET | Freq: Two times a day (BID) | ORAL | Status: DC
  Filled 2013-07-06 (×2): qty 2

## 2013-07-06 MED ORDER — HYDROXYZINE HCL 25 MG PO TABS
25.0000 mg | ORAL_TABLET | Freq: Four times a day (QID) | ORAL | Status: DC | PRN
  Administered 2013-07-08 – 2013-07-10 (×5): 25 mg via ORAL
  Filled 2013-07-06 (×4): qty 1
  Filled 2013-07-06: qty 30
  Filled 2013-07-06 (×2): qty 1

## 2013-07-06 NOTE — [facility] Group Notes (Signed)
Adult Psychoeducational Group Note  Date:  07/06/2013 Time:  10:14 PM  Group Topic/Focus:  Wrap-Up Group:   The focus of this group is to help patients review their daily goal of treatment and discuss progress on daily workbooks.  Participation Level:  Did Not Attend  Participation Quality:  None  Affect:  None  Cognitive:  None  Insight: None  Engagement in Group:  None  Modes of Intervention:  Discussion  Additional Comments:  Marisa Sherman did not attend group.  Marisa Sherman, Marisa Sherman A 07/06/2013, 10:14 PM

## 2013-07-06 NOTE — [facility] Group Notes (Signed)
[facility] Group Notes:  (Nursing/MHT/Case Management/Adjunct)  Date:  07/06/2013  Time:  3:11 PM  Type of Therapy:  Psychoeducational Skills  Participation Level:  Did Not Attend   Buford DresserForrest, Nitza Schmid Shanta 07/06/2013, 3:11 PM

## 2013-07-06 NOTE — Progress Notes (Signed)
Patient ID: Marisa Sherman, female   DOB: 01/04/1961, 53 y.o.   MRN: [id number] [facility] MD Progress Note  07/06/2013 10:23 AM Marisa Sherman  MRN:  [id number]   Subjective: Pt seen and chart reviewed. Pt denies SI, HI, and AVH, contracts for safety. Pt reports that she has been having trouble sleeping along with racing thoughts as well. Pt reports minimal participation in group due to "extreme depression and being very down". Pt is visibly depressed and rates this at 7/10 while minimizing anxiety.    Diagnosis:    AXIS I: Bipolar, Depressed, Substance Abuse and Substance Induced Mood Disorder  AXIS II: Deferred  AXIS III:  Past Medical History   Diagnosis  Date   .  Bipolar disorder    .  Hypertension     AXIS IV: other psychosocial or environmental problems and problems related to social environment  AXIS V: Moderate symptoms   Total Time spent with patient: 25 minutes   ADL's:  Improving   Sleep: good   Appetite:  Good  Suicidal Ideation:  Denies any current SI  Homicidal Ideation:  denies AEB (as evidenced by):  Psychiatric Specialty Exam: Physical Exam  Review of Systems  Constitutional: Negative for fever and chills.  Respiratory: Negative for shortness of breath.   Cardiovascular: Negative for chest pain.  Gastrointestinal: Negative for vomiting.  Psychiatric/Behavioral: Positive for depression. Negative for suicidal ideas and hallucinations.    Blood pressure 90/61, pulse 75, temperature 98.1 F (36.7 C), temperature source Oral, resp. rate 18, height '5\' 5"'  (1.651 m), weight 85.276 kg (188 lb).Body mass index is 31.28 kg/(m^2).  General Appearance: Fairly Groomed  Engineer, water::  Good  Speech:  Normal Rate  Volume:  Normal  Mood: still depressed but reported as improving  Affect: reactive   Thought Process:  Linear  Orientation:  Negative no evidence of confusion or delirium  Thought Content:  denies psychotic symptoms currently  Suicidal Thoughts:  No SI at this  time - contracts for safety on unit    Homicidal Thoughts:  Denies   Memory:  NA  Judgement:  Fair  Insight:  Fair  Psychomotor Activity:  Normal  Concentration:  NA  Recall:  Good  Fund of Knowledge:Good  Language: Good  Akathisia:  No  Handed:  Right  AIMS (if indicated):     Assets:  Desire for Improvement Resilience  Sleep:  Number of Hours: 4.5   Musculoskeletal: Strength & Muscle Tone: within normal limits Gait & Station: normal Patient leans: N/A  Current Medications: Current Facility-Administered Medications  Medication Dose Route Frequency Provider Last Rate Last Dose  . acetaminophen (TYLENOL) tablet 650 mg  650 mg Oral Q6H PRN Clarene Reamer, MD   650 mg at 07/04/13 0951  . alum & mag hydroxide-simeth (MAALOX/MYLANTA) 200-200-20 MG/5ML suspension 30 mL  30 mL Oral Q4H PRN Clarene Reamer, MD      . carbamazepine (TEGRETOL) tablet 200 mg  200 mg Oral BID Clarene Reamer, MD   200 mg at 07/06/13 0732  . FLUoxetine (PROZAC) capsule 40 mg  40 mg Oral q morning - 10a Clarene Reamer, MD   40 mg at 07/06/13 0732  . lisinopril (PRINIVIL,ZESTRIL) tablet 10 mg  10 mg Oral Daily Clarene Reamer, MD   10 mg at 07/06/13 0732   And  . hydrochlorothiazide (MICROZIDE) capsule 12.5 mg  12.5 mg Oral Daily Clarene Reamer, MD   12.5 mg at 07/06/13 0732  . lithium carbonate  capsule 600 mg  600 mg Oral QHS Clarene Reamer, MD   600 mg at 07/05/13 2159  . magnesium hydroxide (MILK OF MAGNESIA) suspension 30 mL  30 mL Oral Daily PRN Clarene Reamer, MD      . OLANZapine [facility]) tablet 10 mg  10 mg Oral Daily Neita Garnet, MD   10 mg at 07/06/13 0732  . traZODone (DESYREL) tablet 150 mg  150 mg Oral QHS Clarene Reamer, MD   150 mg at 07/05/13 2159    Lab Results:  Results for orders placed during the hospital encounter of 07/02/13 (from the past 48 hour(s))  TSH     Status: None   Collection Time    07/05/13  6:25 AM      Result Value Ref Range   TSH 1.720  0.350 - 4.500 uIU/mL    Comment: Performed at [facility] PANEL     Status: None   Collection Time    07/05/13  6:25 AM      Result Value Ref Range   Sodium 142  137 - 147 mEq/L   Potassium 3.9  3.7 - 5.3 mEq/L   Chloride 104  96 - 112 mEq/L   CO2 29  19 - 32 mEq/L   Glucose, Bld 89  70 - 99 mg/dL   BUN 10  6 - 23 mg/dL   Creatinine, Ser 0.73  0.50 - 1.10 mg/dL   Calcium 9.5  8.4 - 10.5 mg/dL   GFR calc non Af Amer >90  >90 mL/min   GFR calc Af Amer >90  >90 mL/min   Comment: (NOTE)     The eGFR has been calculated using the CKD EPI equation.     This calculation has not been validated in all clinical situations.     eGFR's persistently <90 mL/min signify possible Chronic Kidney     Disease.     Performed at [facility]    Physical Findings: AIMS: Facial and Oral Movements Muscles of Facial Expression: None, normal Lips and Perioral Area: None, normal Jaw: None, normal Tongue: None, normal,Extremity Movements Upper (arms, wrists, hands, fingers): None, normal Lower (legs, knees, ankles, toes): None, normal, Trunk Movements Neck, shoulders, hips: None, normal, Overall Severity Severity of abnormal movements (highest score from questions above): None, normal Incapacitation due to abnormal movements: None, normal Patient's awareness of abnormal movements (rate only patient's report): No Awareness, Dental Status Current problems with teeth and/or dentures?: No Does patient usually wear dentures?: Yes  CIWA:    COWS:     Assessment-  Improving mood and affect, but still depressed and milieu participation limited. Tolerating medications well- she is on a combination of psychiatric medications and describes a desire to simplify her medication regimen gradually. We reviewed side effects- she wants to taper down Zyprexa.  Of note labs reviewed- TSH and BMP WNL  Treatment Plan Summary: Review of chart, vital signs, medications, and notes.  1-Individual and  group therapy  2-Medication management for depression and anxiety: Medications reviewed with the patient and she stated no untoward effects. 3-Coping skills for depression, anxiety  4-Continue crisis stabilization and management  5-Address health issues--monitoring vital signs, stable  6-Treatment plan in progress to prevent relapse of depression and anxiety  Plan: As above, and continue inpatient support, milieu, group therapy.  Medical Decision Making Problem Points:  Established problem, stable/improving (1) Data Points:  Review of medication regiment & side effects (2) Review of new  medications or change in dosage (2)  I certify that inpatient services furnished can reasonably be expected to improve the patient's condition.   Benjamine Mola, FNP-BC 07/06/2013, 10:23 AM  Patient seen, evaluated and I agree with notes by Nurse Practitioner. Corena Pilgrim, MD

## 2013-07-06 NOTE — Progress Notes (Signed)
Patient ID: Marisa Sherman, female   DOB: 04-28-1960, 53 y.o.   MRN: [id number]  D: Pt has been very flat and depressed on the unit today, she was in the bed most of the day. Pt did not attend any groups, nor did she engage in treatment. Pt reported on her self inventory sheet that her depression was a 7, and that her hopelessness was a 7. Pt reported being negative SI/HI, no AH/VH noted. A: 15 min checks continued for patient safety. R: Pt safety maintained.

## 2013-07-06 NOTE — [facility] Group Notes (Signed)
[facility] Group Notes: (Clinical Social Work)   07/06/2013      Type of Therapy:  Group Therapy   Participation Level:  Did Not Attend    Kamika Goodloe Grossman-Orr, LCSW 07/06/2013, 4:20 PM     

## 2013-07-07 MED ORDER — DOXEPIN HCL 25 MG PO CAPS
25.0000 mg | ORAL_CAPSULE | Freq: Every evening | ORAL | Status: DC | PRN
  Administered 2013-07-07: 25 mg via ORAL
  Filled 2013-07-07 (×7): qty 1

## 2013-07-07 NOTE — Progress Notes (Signed)
D: Pt continues to be very depressed on the unit. Pt continues not to engage in group or treatment. Pt reported that she is just not in the mood to do anything. Pt reported that her depression was a 5, and her helplessness was a 5. Pt reported being negative SI/HI, no AH/VH noted. A: 15 min checks continued for patient safety. R: Pt safety maintained.

## 2013-07-07 NOTE — [facility] Group Notes (Signed)
[facility] Group Notes: (Clinical Social Work)   07/07/2013      Type of Therapy:  Group Therapy   Participation Level:  Did Not Attend    Ambrose MantleMareida Grossman-Orr, LCSW 07/07/2013, 3:57 PM

## 2013-07-07 NOTE — Progress Notes (Signed)
Patient ID: Marisa Sherman, female   DOB: 02/14/1960, 53 y.o.   MRN: [id number] [facility] Progress Note  07/07/2013 12:37 PM Marisa Sherman  MRN:  [id number]   Subjective: Pt seen and chart reviewed. Pt denies SI, HI, and AVH, contracts for safety. Pt reports major improvement in racing thoughts but also reports feeling groggy upon waking. Will d/c Trazodone and replace with Doxepin. Pt reports mild improvement in depression but states that she feels mostly the same. Pt affirms understanding that some medications will take more time to reach therapeutic levels.    Diagnosis:    AXIS I: Bipolar, Depressed, Substance Abuse and Substance Induced Mood Disorder  AXIS II: Deferred  AXIS III:  Past Medical History   Diagnosis  Date   .  Bipolar disorder    .  Hypertension     AXIS IV: other psychosocial or environmental problems and problems related to social environment  AXIS V: Moderate symptoms   Total Time spent with patient: 25 minutes   ADL's:  Improving   Sleep: good   Appetite:  Good  Suicidal Ideation:  Denies any current SI  Homicidal Ideation:  denies AEB (as evidenced by):  Psychiatric Specialty Exam: Physical Exam  Review of Systems  Constitutional: Negative for fever and chills.  Respiratory: Negative for shortness of breath.   Cardiovascular: Negative for chest pain.  Gastrointestinal: Negative for vomiting.  Psychiatric/Behavioral: Positive for depression. Negative for suicidal ideas and hallucinations.    Blood pressure 93/60, pulse 81, temperature 98 F (36.7 C), temperature source Oral, resp. rate 18, height 5\' 5"  (1.651 m), weight 85.276 kg (188 lb).Body mass index is 31.28 kg/(m^2).  General Appearance: Fairly Groomed  Patent attorneyye Contact::  Good  Speech:  Normal Rate  Volume:  Normal  Mood: still depressed but reported as improving  Affect: reactive   Thought Process:  Linear  Orientation:  Negative no evidence of confusion or delirium  Thought Content:  denies  psychotic symptoms currently  Suicidal Thoughts:  No SI at this time - contracts for safety on unit    Homicidal Thoughts:  Denies   Memory:  NA  Judgement:  Fair  Insight:  Fair  Psychomotor Activity:  Normal  Concentration:  NA  Recall:  Good  Fund of Knowledge:Good  Language: Good  Akathisia:  No  Handed:  Right  AIMS (if indicated):     Assets:  Desire for Improvement Resilience  Sleep:  Number of Hours: 5   Musculoskeletal: Strength & Muscle Tone: within normal limits Gait & Station: normal Patient leans: N/A  Current Medications: Current Facility-Administered Medications  Medication Dose Route Frequency Provider Last Rate Last Dose  . acetaminophen (TYLENOL) tablet 650 mg  650 mg Oral Q6H PRN Benjaman PottGerald D Taylor, MD   650 mg at 07/04/13 0951  . alum & mag hydroxide-simeth (MAALOX/MYLANTA) 200-200-20 MG/5ML suspension 30 mL  30 mL Oral Q4H PRN Benjaman PottGerald D Taylor, MD      . carbamazepine (TEGRETOL) tablet 200 mg  200 mg Oral BID Benjaman PottGerald D Taylor, MD   200 mg at 07/07/13 0912  . FLUoxetine (PROZAC) capsule 40 mg  40 mg Oral q morning - 10a Benjaman PottGerald D Taylor, MD   40 mg at 07/07/13 0912  . lisinopril (PRINIVIL,ZESTRIL) tablet 10 mg  10 mg Oral Daily Benjaman PottGerald D Taylor, MD   10 mg at 07/07/13 0912   And  . hydrochlorothiazide (MICROZIDE) capsule 12.5 mg  12.5 mg Oral Daily Benjaman PottGerald D Taylor, MD   12.5  mg at 07/07/13 0912  . hydrOXYzine (ATARAX/VISTARIL) tablet 25 mg  25 mg Oral Q6H PRN Beau FannyJohn C Withrow, FNP      . lithium carbonate capsule 600 mg  600 mg Oral QHS Benjaman PottGerald D Taylor, MD   600 mg at 07/06/13 2211  . magnesium hydroxide (MILK OF MAGNESIA) suspension 30 mL  30 mL Oral Daily PRN Benjaman PottGerald D Taylor, MD      . OLANZapine [facility](ZYPREXA) tablet 10 mg  10 mg Oral Daily Nehemiah MassedFernando Cobos, MD   10 mg at 07/07/13 0912  . traZODone (DESYREL) tablet 150 mg  150 mg Oral QHS Benjaman PottGerald D Taylor, MD   150 mg at 07/06/13 2211    Lab Results:  No results found for this or any previous visit (from the past 48  hour(s)).  Physical Findings: AIMS: Facial and Oral Movements Muscles of Facial Expression: None, normal Lips and Perioral Area: None, normal Jaw: None, normal Tongue: None, normal,Extremity Movements Upper (arms, wrists, hands, fingers): None, normal Lower (legs, knees, ankles, toes): None, normal, Trunk Movements Neck, shoulders, hips: None, normal, Overall Severity Severity of abnormal movements (highest score from questions above): None, normal Incapacitation due to abnormal movements: None, normal Patient's awareness of abnormal movements (rate only patient's report): No Awareness, Dental Status Current problems with teeth and/or dentures?: No Does patient usually wear dentures?: Yes  CIWA:    COWS:     Assessment-  Improving mood and affect, but still depressed and milieu participation limited. Tolerating medications well- she is on a combination of psychiatric medications and describes a desire to simplify her medication regimen gradually. We reviewed side effects- she wants to taper down Zyprexa.  Of note labs reviewed- TSH and BMP WNL  Treatment Plan Summary: Review of chart, vital signs, medications, and notes.  1-Individual and group therapy  2-Medication management for depression and anxiety: Medications reviewed with the patient and she stated no untoward effects. -Discontinue Trazodone -Doxepin 25mg  qhs PRN insomnia  3-Coping skills for depression, anxiety  4-Continue crisis stabilization and management  5-Address health issues--monitoring vital signs, stable  6-Treatment plan in progress to prevent relapse of depression and anxiety  Plan: As above, and continue inpatient support, milieu, group therapy.  Medical Decision Making Problem Points:  Established problem, stable/improving (1) Data Points:  Review of medication regiment & side effects (2) Review of new medications or change in dosage (2)  I certify that inpatient services furnished can reasonably be  expected to improve the patient's condition.   Beau FannyWithrow, John C, FNP-BC 07/07/2013, 12:37 PM  Patient seen, evaluated and I agree with notes by Nurse Practitioner. Thedore MinsMojeed Tipton Ballow, MD

## 2013-07-07 NOTE — Progress Notes (Signed)
Patient ID: Marisa Sherman, female   DOB: 03-11-1960, 53 y.o.   MRN: [id number] D)  Spent most of the evening in bed,  Refused to get up to go to group, stated was just too tired, stated tired "all the time".  Came to dayroom briefly for snack and to ask for hs meds, went back to her room.   Very little interaction with peers, if any, or with staff.  Denied thoughts of self harm, went back to her room and to bed. A)  Will continue to monitor for safety, continue POC R)  Safety maintained.

## 2013-07-07 NOTE — [facility] Group Notes (Signed)
Adult Psychoeducational Group Note  Date:  07/07/2013 Time:  9:19 PM  Group Topic/Focus:  Wrap-Up Group:   The focus of this group is to help patients review their daily goal of treatment and discuss progress on daily workbooks.  Participation Level:  Did Not Attend  Participation Quality:  Did not attend  Affect:  Did not attend  Cognitive:  Did not attend  Insight: None  Engagement in Group:  None  Modes of Intervention:  Discussion, Education and Support  Additional Comments:  Pt did not attend this group.  Malachy MoanJeffers, Peggi Yono S 07/07/2013, 9:19 PM

## 2013-07-08 MED ORDER — FLEET ENEMA 7-19 GM/118ML RE ENEM
1.0000 | ENEMA | Freq: Once | RECTAL | Status: AC
  Administered 2013-07-08: 1 via RECTAL
  Filled 2013-07-08 (×2): qty 1

## 2013-07-08 NOTE — Progress Notes (Signed)
Patient ID: Marisa Sherman, female   DOB: [birth year], 53 y.o.   MRN: [id number] D)  Spent the evening in bed.  Was encouraged to come to group and acted like she would be there, but changed her mind at the last minute, turned over in bed, faced the wall and said she wasn't going.  Did come out later for snack and hs meds.  Went back to bed and was having some difficulty going to sleep, but didn't request repeat of sinequan.  A)  Will continue to monitor for safety, encourage more in the milieu, continue POC R)  Safety maintained.

## 2013-07-08 NOTE — Progress Notes (Signed)
Patient ID: Marisa Sherman, female   DOB: [birth year], 53 y.o.   MRN: [id number] [facility]  07/08/2013 4:24 PM Marisa Sherman  MRN:  [id number] Subjective: Patient reports she  is feeling better- she does complain of constipation, and states she has had only one BM since admission.  Objective: Patient is improved- mood and affect are improved and she is focusing on being discharged soon.  She does, as above, report constipation. No other side effects reported and she feels her medications are helping and effective. Behavior on unit in good control.. She has tended to have limited participation in milieu, and has missed several groups, but states she is planning to go more consistently.  We reviewed psychiatric medication side effects, to include potential anticholinergic side effects, such as constipation.    Admit Diagnosis:  AXIS I: Bipolar, Depressed, Substance Abuse and Substance Induced Mood Disorder  AXIS II: Deferred  AXIS III:  Past Medical History   Diagnosis  Date   .  Bipolar disorder    .  Hypertension     AXIS IV: other psychosocial or environmental problems and problems related to social environment  AXIS V: 45   Total Time spent with patient: 20 minutes   ADL's:  Improving   Sleep: good   Appetite:  Good  Suicidal Ideation:  Denies any current SI  Homicidal Ideation:  denies AEB (as evidenced by):  Psychiatric Specialty Exam: Physical Exam  Review of Systems  Constitutional: Negative for fever and chills.  Respiratory: Negative for shortness of breath.   Cardiovascular: Negative for chest pain.  Gastrointestinal: Positive for constipation. Negative for nausea and vomiting.  Psychiatric/Behavioral: Positive for depression. Negative for suicidal ideas and hallucinations.    Blood pressure 106/76, pulse 72, temperature 97.6 F (36.4 C), temperature source Oral, resp. rate 20, height 5\' 5"  (1.651 m), weight 85.276 kg (188 lb).Body mass index is 31.28  kg/(m^2).  General Appearance: improved   Eye Contact::  Good  Speech:  Normal Rate  Volume:  Normal  Mood:  reported as improving  Affect: reactive   Thought Process:  Linear  Orientation:  0x3   Thought Content:  denies psychotic symptoms currently  Suicidal Thoughts:  No SI at this time - contracts for safety on unit    Homicidal Thoughts:  Denies   Memory:  NA  Judgement:  Fair  Insight:  Fair  Psychomotor Activity:  Normal  Concentration:  NA  Recall:  Good  Fund of Knowledge:Good  Language: Good  Akathisia:  No  Handed:  Right  AIMS (if indicated):     Assets:  Desire for Improvement Resilience  Sleep:  Number of Hours: 6.25   Musculoskeletal: Strength & Muscle Tone: within normal limits Gait & Station: normal Patient leans: N/A  Current Medications: Current Facility-Administered Medications  Medication Dose Route Frequency Provider Last Rate Last Dose  . acetaminophen (TYLENOL) tablet 650 mg  650 mg Oral Q6H PRN Benjaman PottGerald D Taylor, MD   650 mg at 07/04/13 0951  . alum & mag hydroxide-simeth (MAALOX/MYLANTA) 200-200-20 MG/5ML suspension 30 mL  30 mL Oral Q4H PRN Benjaman PottGerald D Taylor, MD      . carbamazepine (TEGRETOL) tablet 200 mg  200 mg Oral BID Benjaman PottGerald D Taylor, MD   200 mg at 07/08/13 0836  . doxepin (SINEQUAN) capsule 25 mg  25 mg Oral QHS,MR X 1 Beau FannyJohn C Withrow, FNP   25 mg at 07/07/13 2141  . FLUoxetine (PROZAC) capsule 40 mg  40  mg Oral q morning - 10a Benjaman PottGerald D Taylor, MD   40 mg at 07/08/13 1039  . lisinopril (PRINIVIL,ZESTRIL) tablet 10 mg  10 mg Oral Daily Benjaman PottGerald D Taylor, MD   10 mg at 07/08/13 [id number]   And  . hydrochlorothiazide (MICROZIDE) capsule 12.5 mg  12.5 mg Oral Daily Benjaman PottGerald D Taylor, MD   12.5 mg at 07/08/13 0836  . hydrOXYzine (ATARAX/VISTARIL) tablet 25 mg  25 mg Oral Q6H PRN Beau FannyJohn C Withrow, FNP      . lithium carbonate capsule 600 mg  600 mg Oral QHS Benjaman PottGerald D Taylor, MD   600 mg at 07/07/13 2141  . magnesium hydroxide (MILK OF MAGNESIA) suspension 30  mL  30 mL Oral Daily PRN Benjaman PottGerald D Taylor, MD   30 mL at 07/08/13 1612  . OLANZapine (ZYPREXA) tablet 10 mg  10 mg Oral Daily Nehemiah MassedFernando Cobos, MD   10 mg at 07/08/13 [id number]    Lab Results:  No results found for this or any previous visit (from the past 48 hour(s)).  Physical Findings: AIMS: Facial and Oral Movements Muscles of Facial Expression: None, normal Lips and Perioral Area: None, normal Jaw: None, normal Tongue: None, normal,Extremity Movements Upper (arms, wrists, hands, fingers): None, normal Lower (legs, knees, ankles, toes): None, normal, Trunk Movements Neck, shoulders, hips: None, normal, Overall Severity Severity of abnormal movements (highest score from questions above): None, normal Incapacitation due to abnormal movements: None, normal Patient's awareness of abnormal movements (rate only patient's report): No Awareness, Dental Status Current problems with teeth and/or dentures?: No Does patient usually wear dentures?: Yes  CIWA:    COWS:     Assessment-   Improving mood and affect, less depressed, starting to focus on discharge date and is less ruminative than when she was admitted. Tolerating medications well, except for constipation. Suspect that Doxepin may be contributing due to its potential to cause anticholinergic side effects. Hads tolerated Zyprexa taper well, and feels less sedated.   Treatment Plan Summary: Continue treatment as above, but stop Doxepin. Consider discharge soon as she continues to improve. Consider Enema to address constipation, as she states PRN medication has not helped.  Will follow   Plan: As above, and continue inpatient support, milieu, group therapy.  Medical Decision Making Problem Points:  Established problem, stable/improving (1) Data Points:  Review of medication regiment & side effects (2) Review of new medications or change in dosage (2)  I certify that inpatient services furnished can reasonably be expected to improve the  patient's condition.   COBOS, FERNANDO 07/08/2013, 4:24 PM

## 2013-07-08 NOTE — Progress Notes (Signed)
D: Pt. Interacting with staff and peers appropriately on unit.  Pt. Denies SI, HI and AVH. Pt. Sleeping majority of the time. No c/o.  A: Will continue to monitor for safety. Pt. Given emotional support from RN. Pt. Medication routine continued. Pt. Encouraged to come to me with questions.   R: Pt. Remains appropriate and cooperative. Will continue to monitor pt. For safety.

## 2013-07-08 NOTE — Progress Notes (Signed)
Pt. Given Fleets enema with instructions for use. Pt. Verbalized understanding of same.

## 2013-07-08 NOTE — [facility] Group Notes (Signed)
[facility]   07/08/2013  8:45 AM  Participation Quality:  Did Not Attend - pt was awake lying in her bed but states that she is tired and not coming to group  Reyes Ivanhelsea Horton, LCSW 07/08/2013 10:06 AM

## 2013-07-08 NOTE — Progress Notes (Signed)
Didn't attend group 

## 2013-07-08 NOTE — [facility] Group Notes (Signed)
[facility] LCSW Group Therapy  07/08/2013 1:15 PM   Type of Therapy:  Group Therapy  Participation Level:  Did Not Attend - pt sat in group for about 5 minutes before leaving and not returning  Reyes IvanChelsea Horton, LCSW 07/08/2013 3:17 PM

## 2013-07-09 MED ORDER — TRAZODONE HCL 50 MG PO TABS
50.0000 mg | ORAL_TABLET | Freq: Every evening | ORAL | Status: DC | PRN
  Administered 2013-07-09: 50 mg via ORAL
  Filled 2013-07-09: qty 1

## 2013-07-09 NOTE — Progress Notes (Signed)
D: Patient denies SI/HI and A/V hallucinations; patient reports sleep is fair; reports appetite is good; reports energy level is low ; reports ability to pay attention is good; rates depression as 4/10; rates hopelessness 3/10  A: Monitored q 15 minutes; patient encouraged to attend groups; patient educated about medications; patient given medications per physician orders; patient encouraged to express feelings and/or concerns  R: Patient stays in the bed all day and does not participate in group; patient is pleasant and complains of some anxiety; patient's interaction with staff and peers is approrpiate; patient was able to set goal to talk with staff 1:1 when having feelings of SI; patient is taking medications as prescribed and tolerating medications

## 2013-07-09 NOTE — Progress Notes (Signed)
Adult Psychoeducational Group Note  Date:  07/09/2013 Time:  10:11 PM  Group Topic/Focus:  Wrap-Up Group:   The focus of this group is to help patients review their daily goal of treatment and discuss progress on daily workbooks.  Participation Level:  Active  Participation Quality:  Appropriate  Affect:  Appropriate  Cognitive:  Alert and Appropriate  Insight: Good  Engagement in Group:  Supportive  Modes of Intervention:  Activity and Clarification  Additional Comments:  Pt was very supportive in group and help get other to communicate   Pettress, Delton R 07/09/2013, 10:11 PM

## 2013-07-09 NOTE — Progress Notes (Signed)
Patient ID: Marisa Sherman, female   DOB: Mar 28, 1960, 53 y.o.   MRN: [id number] Pt visible in the milieu.  Interacting appropriately with staff and peers.  Attending groups.  Pt reported she had a really good day.  Rated depression 2/10 with 10 being the worst.  Needs assessed.  Pt denied.  Fifteen minute checks in progress for patient safety.  Pt safe on unit.

## 2013-07-09 NOTE — Tx Team (Addendum)
Interdisciplinary Treatment Plan Update (Adult)  Date: 07/09/2013  Time Reviewed:  9:45 AM  Progress in Treatment: Attending groups: Yes Participating in groups:  Yes Taking medication as prescribed:  Yes Tolerating medication:  Yes Family/Significant othe contact made: Yes, with pt's boyfriend Patient understands diagnosis:  Yes Discussing patient identified problems/goals with staff:  Yes Medical problems stabilized or resolved:  Yes Denies suicidal/homicidal ideation: Yes Issues/concerns per patient self-inventory:  Yes Other:  New problem(s) identified: N/A  Discharge Plan or Barriers: [facility]    Reason for Continuation of Hospitalization: Anxiety Depression Medication Stabilization  Comments: N/A  Estimated length of stay: 1-2 days  For review of initial/current patient goals, please see plan of care.  Attendees: Patient:     Family:     Physician:  Dr. Jama Flavorsobos 07/09/2013 9:25 AM   Nursing:   Leighton ParodyBritney Tyson, RN 07/09/2013 9:25 AM   Clinical Social Worker:  Reyes Ivanhelsea Horton, LCSW 07/09/2013 9:25 AM   Other: Neill Loftarol Davis, RN 07/09/2013 9:25 AM   Other:  Quintella ReichertBeverly Knight, RN 07/09/2013 9:25 AM   Other:  Juline PatchQuylle Hodnett, LCSW 07/09/2013 9:25 AM   Other:  Piedad ClimesKelley Miller, Pharmacy resident 07/09/2013 9:29 AM   Other:    Other:    Other:    Other:    Other:     Scribe for Treatment Team:   Carmina MillerHorton, Joquan Lotz Nicole, 07/09/2013 9:29 AM

## 2013-07-09 NOTE — Progress Notes (Signed)
Patient ID: Marisa Sherman, female   DOB: [birth year], 53 y.o.   MRN: [id number] [facility] MD Progress Note  07/09/2013 4:26 PM Deaysia Grigoryan  MRN:  [id number] Subjective: Feeling better- had been focused on constipation , but states was able to have a BM with the Fleets enema.  Objective: I have discussed patient with treatment team and I have met with patient. Mood and affect continue to gradually improve. .  Behavior on unit in good control. Her participation in milieu activities, particularly group therapy sessions, is poor. I have discussed this with her. Patient is candid in stating that she is not particularly interested in attending these groups, and there may also be an element of anxiety in having to participate, speak in public, although lack of motivation seems to be a major aspect of this. I have encouraged her to attend and have emphasized she can learn new coping skills and develop new knowledge via group participation..  At this time denies any side effects from medications. Reports sleep tends to be fair off trazodone and wants to restart it.  We reviewed side effect profile for the medications she is on to include potentially serious side effects fromTegretol and Lithiumm, narrow therapeutic window of Li, and signs of lithium toxicity. We discussed disposition options- patient plans to go back home ( lives with BF) after discharge, and pursue outpatient psychiatric follow ups.   Admit Diagnosis:  AXIS I: Bipolar, Depressed, Substance Abuse and Substance Induced Mood Disorder  AXIS II: Deferred  AXIS III:  Past Medical History   Diagnosis  Date   .  Bipolar disorder    .  Hypertension     AXIS IV: other psychosocial or environmental problems and problems related to social environment  AXIS V: 45   Total Time spent with patient: 20 minutes   ADL's:  Improving   Sleep: states it was better on trazdone   Appetite:  Good  Suicidal Ideation:  Denies any current SI  Homicidal  Ideation:  denies AEB (as evidenced by):  Psychiatric Specialty Exam: Physical Exam  Review of Systems  Constitutional: Negative for fever and chills.  Respiratory: Negative for shortness of breath.   Cardiovascular: Negative for chest pain.  Gastrointestinal: Negative for nausea, vomiting and constipation.  Psychiatric/Behavioral: Positive for depression. Negative for suicidal ideas and hallucinations. The patient has insomnia. The patient is not nervous/anxious.     Blood pressure 120/90, pulse 76, temperature 98.2 F (36.8 C), temperature source Oral, resp. rate 14, height '5\' 5"'  (1.651 m), weight 85.276 kg (188 lb).Body mass index is 31.28 kg/(m^2).  General Appearance: improved   Eye Contact::  Good  Speech:  Normal Rate  Volume:  Normal  Mood:  Better. Denies significant depression at this time.   Affect: reactive , smiles appropriately   Thought Process:  Linear  Orientation:  0x3   Thought Content:  No hallucinations and no delusions.  Suicidal Thoughts:  No SI at this time - contracts for safety on unit    Homicidal Thoughts:  Denies   Memory:  NA  Judgement:  Fair  Insight:  Fair  Psychomotor Activity:  Normal  Concentration:  NA  Recall:  Good  Fund of Knowledge:Good  Language: Good  Akathisia:  No  Handed:  Right  AIMS (if indicated):     Assets:  Desire for Improvement Resilience  Sleep:  Number of Hours: 6.25   Musculoskeletal: Strength & Muscle Tone: within normal limits Gait & Station: normal Patient leans: N/A  Current Medications: Current Facility-Administered Medications  Medication Dose Route Frequency Provider Last Rate Last Dose  . acetaminophen (TYLENOL) tablet 650 mg  650 mg Oral Q6H PRN Clarene Reamer, MD   650 mg at 07/04/13 0951  . alum & mag hydroxide-simeth (MAALOX/MYLANTA) 200-200-20 MG/5ML suspension 30 mL  30 mL Oral Q4H PRN Clarene Reamer, MD      . carbamazepine (TEGRETOL) tablet 200 mg  200 mg Oral BID Clarene Reamer, MD   200 mg  at 07/09/13 [phone]  . FLUoxetine (PROZAC) capsule 40 mg  40 mg Oral q morning - 10a Clarene Reamer, MD   40 mg at 07/09/13 0910  . lisinopril (PRINIVIL,ZESTRIL) tablet 10 mg  10 mg Oral Daily Clarene Reamer, MD   10 mg at 07/09/13 1735   And  . hydrochlorothiazide (MICROZIDE) capsule 12.5 mg  12.5 mg Oral Daily Clarene Reamer, MD   12.5 mg at 07/09/13 6701  . hydrOXYzine (ATARAX/VISTARIL) tablet 25 mg  25 mg Oral Q6H PRN Benjamine Mola, FNP   25 mg at 07/09/13 0910  . lithium carbonate capsule 600 mg  600 mg Oral QHS Clarene Reamer, MD   600 mg at 07/08/13 2200  . magnesium hydroxide (MILK OF MAGNESIA) suspension 30 mL  30 mL Oral Daily PRN Clarene Reamer, MD   30 mL at 07/08/13 1612  . OLANZapine (ZYPREXA) tablet 10 mg  10 mg Oral Daily Neita Garnet, MD   10 mg at 07/09/13 [phone]  . traZODone (DESYREL) tablet 50 mg  50 mg Oral QHS PRN Neita Garnet, MD        Lab Results:  No results found for this or any previous visit (from the past 48 hour(s)).  Physical Findings: AIMS: Facial and Oral Movements Muscles of Facial Expression: None, normal Lips and Perioral Area: None, normal Jaw: None, normal Tongue: None, normal,Extremity Movements Upper (arms, wrists, hands, fingers): None, normal Lower (legs, knees, ankles, toes): None, normal, Trunk Movements Neck, shoulders, hips: None, normal, Overall Severity Severity of abnormal movements (highest score from questions above): None, normal Incapacitation due to abnormal movements: None, normal Patient's awareness of abnormal movements (rate only patient's report): No Awareness, Dental Status Current problems with teeth and/or dentures?: No Does patient usually wear dentures?: Yes  CIWA:    COWS:     Assessment-  Continues to improve- mood and affect less depressed , fuller in range. No side effects from medication regimen at this time.  Wants Trazodone PRN for insomnia. Behavior calm and in control, little motivation to participate in  milieu activities at this time.  Starting to focus more on discharge options/planning.  Treatment Plan Summary: Continue treatment - restart Trazodone PRN INSOMNIA. Possible Discharge tomorrow as she continues to stabilize.  Will order routine labs ( Li level, carbamazepine level)  To ensure therapeutic dosages.   Plan: As above, and continue inpatient support, milieu, group therapy.  Medical Decision Making Problem Points:  Established problem, stable/improving (1) Data Points:  Review or order clinical lab tests (1) Review of medication regiment & side effects (2)  I certify that inpatient services furnished can reasonably be expected to improve the patient's condition.   COBOS, FERNANDO 07/09/2013, 4:26 PM

## 2013-07-09 NOTE — Progress Notes (Signed)
D: Pt denies any SI/HI/AVH. Pt states " Im doing good. Pt endorses anxiety this evening. Pt was invited to group this evening. Pt verbalizes the plan to attend. This pt did not attend group. Pt isolated to her room for the majority of this shift. A: Writer administered scheduled and prn medications to pt. Continued support and availability as needed was extended to this pt. Staff continue to monitor pt with q4415min checks.  R: No adverse drug reactions noted. Pt receptive to treatment. Pt remains safe at this time.

## 2013-07-09 NOTE — [facility] Group Notes (Signed)
[facility] LCSW Group Therapy  07/09/2013 1:15 PM   Type of Therapy:  Group Therapy  Participation Level:  Did Not Attend - pt sleeping in her room  Reyes IvanChelsea Horton, LCSW 07/09/2013 2:26 PM

## 2013-07-10 LAB — CBC WITH DIFFERENTIAL/PLATELET
BASOS ABS: 0 10*3/uL (ref 0.0–0.1)
BASOS PCT: 0 % (ref 0–1)
Eosinophils Absolute: 0.3 10*3/uL (ref 0.0–0.7)
Eosinophils Relative: 2 % (ref 0–5)
HCT: 40.6 % (ref 36.0–46.0)
HEMOGLOBIN: 13.9 g/dL (ref 12.0–15.0)
Lymphocytes Relative: 34 % (ref 12–46)
Lymphs Abs: 4.6 10*3/uL — ABNORMAL HIGH (ref 0.7–4.0)
MCH: 30.1 pg (ref 26.0–34.0)
MCHC: 34.2 g/dL (ref 30.0–36.0)
MCV: 87.9 fL (ref 78.0–100.0)
MONO ABS: 0.9 10*3/uL (ref 0.1–1.0)
Monocytes Relative: 7 % (ref 3–12)
NEUTROS ABS: 7.9 10*3/uL — AB (ref 1.7–7.7)
NEUTROS PCT: 57 % (ref 43–77)
Platelets: 315 10*3/uL (ref 150–400)
RBC: 4.62 MIL/uL (ref 3.87–5.11)
RDW: 12.5 % (ref 11.5–15.5)
WBC: 13.8 10*3/uL — ABNORMAL HIGH (ref 4.0–10.5)

## 2013-07-10 LAB — COMPREHENSIVE METABOLIC PANEL
ALT: 23 U/L (ref 0–35)
AST: 17 U/L (ref 0–37)
Albumin: 3.8 g/dL (ref 3.5–5.2)
Alkaline Phosphatase: 80 U/L (ref 39–117)
BUN: 8 mg/dL (ref 6–23)
CO2: 24 mEq/L (ref 19–32)
CREATININE: 0.57 mg/dL (ref 0.50–1.10)
Calcium: 9.6 mg/dL (ref 8.4–10.5)
Chloride: 101 mEq/L (ref 96–112)
Glucose, Bld: 89 mg/dL (ref 70–99)
Potassium: 4.1 mEq/L (ref 3.7–5.3)
Sodium: 139 mEq/L (ref 137–147)
Total Bilirubin: 0.2 mg/dL — ABNORMAL LOW (ref 0.3–1.2)
Total Protein: 7.7 g/dL (ref 6.0–8.3)

## 2013-07-10 LAB — LITHIUM LEVEL: Lithium Lvl: 0.5 mEq/L — ABNORMAL LOW (ref 0.80–1.40)

## 2013-07-10 LAB — CARBAMAZEPINE LEVEL, TOTAL: CARBAMAZEPINE LVL: 7.6 ug/mL (ref 4.0–12.0)

## 2013-07-10 MED ORDER — TRAZODONE HCL 100 MG PO TABS
100.0000 mg | ORAL_TABLET | Freq: Every evening | ORAL | Status: DC | PRN
  Filled 2013-07-10: qty 1
  Filled 2013-07-10: qty 14

## 2013-07-10 MED ORDER — LITHIUM CARBONATE 300 MG PO CAPS
300.0000 mg | ORAL_CAPSULE | ORAL | Status: DC
  Administered 2013-07-10 – 2013-07-11 (×2): 300 mg via ORAL
  Filled 2013-07-10 (×2): qty 1
  Filled 2013-07-10: qty 28
  Filled 2013-07-10: qty 1

## 2013-07-10 NOTE — Progress Notes (Signed)
Adult Psychoeducational Group Note  Date:  07/10/2013 Time:  5:07 PM  Group Topic/Focus:  Personal Choices and Values:   The focus of this group is to help patients assess and explore the importance of values in their lives, how their values affect their decisions, how they express their values and what opposes their expression.  Participation Level:  Did Not Attend   Reynolds BowlClement, Jeramyah Goodpasture D 07/10/2013, 5:07 PM

## 2013-07-10 NOTE — Progress Notes (Signed)
Adult Activity Group Note  Date: 07/10/13  Time:10:00a  Group Topic: Art  Participation Level: Did Not Attend   Activity: Patients asked to create art to coincide with daily unit theme using any combination of markers, crayons, color pencils, construction paper, magazine clippings, glue, and scissors.   Additional Comments: Pts explored summertime and the activities they would like to explore when they leave the hospital. Pts created a list on poster board.    

## 2013-07-10 NOTE — [facility] Group Notes (Signed)
[facility] LCSW Group Therapy  07/10/2013  1:15 PM   Type of Therapy:  Group Therapy  Participation Level:  Active  Participation Quality:  Attentive, Sharing and Supportive  Affect:  Depressed and Flat  Cognitive:  Alert and Oriented  Insight:  Developing/Improving and Engaged  Engagement in Therapy:  Developing/Improving and Engaged  Modes of Intervention:  Clarification, Confrontation, Discussion, Education, Exploration, Limit-setting, Orientation, Problem-solving, Rapport Building, Dance movement psychotherapisteality Testing, Socialization and Support  Summary of Progress/Problems: The topic for group today was emotional regulation.  This group focused on both positive and negative emotion identification and allowed group members to process ways to identify feelings, regulate negative emotions, and find healthy ways to manage internal/external emotions. Group members were asked to reflect on a time when their reaction to an emotion led to a negative outcome and explored how alternative responses using emotion regulation would have benefited them. Group members were also asked to discuss a time when emotion regulation was utilized when a negative emotion was experienced.  CSW notes this is the first group pt sat through completely.  Pt was engaged in the group discussion and spoke on others sharing but did not share personally.  Pt actively listened to group discussion.    Reyes IvanChelsea Horton, LCSW 07/10/2013  2:21 PM

## 2013-07-10 NOTE — [facility] Group Notes (Signed)
[facility]   07/10/2013 8:45 AM  Participation Quality:  Alert, Appropriate and Oriented  Mood/Affect:  Flat and Depressed  Depression Rating:  3  Anxiety Rating:  3  Thoughts of Suicide:  Pt denies SI/HI  Will you contract for safety?   Yes  Current AVH:  Pt denies  Plan for Discharge/Comments:  Pt attended discharge planning group and actively participated in group.  CSW provided pt with today's workbook.  Pt reports feeling okay today and ready to d/c.  [place] and [facility] and [facility].  No further needs voiced by pt at this time.    Transportation Means: Pt reports access to transportation - boyfriend will pick pt up  Supports: No supports mentioned at this time  Reyes IvanChelsea Horton, LCSW 07/10/2013 9:42 AM

## 2013-07-10 NOTE — Progress Notes (Signed)
Adult Psychoeducational Group Note  Date:  07/10/2013 Time:  8:54 PM  Group Topic/Focus:  Wrap-Up Group:   The focus of this group is to help patients review their daily goal of treatment and discuss progress on daily workbooks.  Participation Level:  Active  Participation Quality:  Appropriate  Affect:  Appropriate  Cognitive:  Appropriate  Insight: Appropriate  Engagement in Group:  Engaged  Modes of Intervention:  Support  Additional Comments:  Pt stated her goal for tomorrow is to go home and that her advice to her peers is for them to keep going to groups and to know that there is support out there.   Ciceron, Ritchie 07/10/2013, 8:54 PM

## 2013-07-10 NOTE — Progress Notes (Signed)
Patient ID: Marisa Sherman, female   DOB: Jun 17, 1960, 53 y.o.   MRN: [id number] [facility] MD Progress Note  07/10/2013 5:35 PM [name]  MRN:  [id number] Subjective: Feeling better overall- complains of some insomnia on current dose of trazodone and is hoping dose can be increased .  Objective: Patient is better today- she reports improved mood and is starting to focus more on discharge issues.  Mood and affect continue to gradually improve. She has had relatively poor milieu participation, but states she has started to attend groups more.  Behavior on unit in good control.  No side effects from medications reported. As noted, does feel sleep remains fair, and wants trazodone increased. We discussed disposition options- patient plans to go back home ( lives with BF) after discharge, and pursue outpatient psychiatric follow ups. She will be following up at [facility].   Admit Diagnosis:  AXIS I: Bipolar, Depressed, Substance Abuse and Substance Induced Mood Disorder  AXIS II: Deferred  AXIS III:  Past Medical History   Diagnosis  Date   .  Bipolar disorder    .  Hypertension     AXIS IV: other psychosocial or environmental problems and problems related to social environment  AXIS V: 45   Total Time spent with patient: 20 minutes   ADL's:  Improving   Sleep:  fair  Appetite:  Good  Suicidal Ideation:  Denies any current SI  Homicidal Ideation:  denies AEB (as evidenced by):  Psychiatric Specialty Exam: Physical Exam  Review of Systems  Constitutional: Negative for fever and chills.  Respiratory: Negative for shortness of breath.   Cardiovascular: Negative for chest pain.  Gastrointestinal: Negative for nausea, vomiting and constipation.  Psychiatric/Behavioral: The patient has insomnia.     Blood pressure 96/61, pulse 86, temperature 98.3 F (36.8 C), temperature source Oral, resp. rate 20, height _0  (1.651 m), weight 85.276 kg (188 lb).Body mass index is 31.28 kg/(m^2).   General Appearance: improved   Eye Contact::  Good  Speech:  Normal Rate  Volume:  Normal  Mood:   Improved    Affect:  More reactive, pleasant   Thought Process:  Linear  Orientation:  0x3   Thought Content:  No hallucinations and no delusions.  Suicidal Thoughts:  No SI at this time - contracts for safety on unit    Homicidal Thoughts:  Denies   Memory:  NA  Judgement:  Fair  Insight:  Fair  Psychomotor Activity:  Normal  Concentration:  NA  Recall:  Good  Fund of Knowledge:Good  Language: Good  Akathisia:  No  Handed:  Right  AIMS (if indicated):     Assets:  Desire for Improvement Resilience  Sleep:  Number of Hours: 3.5   Musculoskeletal: Strength & Muscle Tone: within normal limits Gait & Station: normal Patient leans: N/A  Current Medications: Current Facility-Administered Medications  Medication Dose Route Frequency Provider Last Rate Last Dose  . acetaminophen (TYLENOL) tablet 650 mg  650 mg Oral Q6H PRN Clarene Reamer, MD   650 mg at 07/04/13 0951  . alum & mag hydroxide-simeth (MAALOX/MYLANTA) 200-200-20 MG/5ML suspension 30 mL  30 mL Oral Q4H PRN Clarene Reamer, MD      . carbamazepine (TEGRETOL) tablet 200 mg  200 mg Oral BID Clarene Reamer, MD   200 mg at 07/10/13 1711  . FLUoxetine (PROZAC) capsule 40 mg  40 mg Oral q morning - 10a Clarene Reamer, MD   40 mg at 07/10/13  6962  . lisinopril (PRINIVIL,ZESTRIL) tablet 10 mg  10 mg Oral Daily Clarene Reamer, MD   10 mg at 07/10/13 9528   And  . hydrochlorothiazide (MICROZIDE) capsule 12.5 mg  12.5 mg Oral Daily Clarene Reamer, MD   12.5 mg at 07/10/13 4132  . hydrOXYzine (ATARAX/VISTARIL) tablet 25 mg  25 mg Oral Q6H PRN Benjamine Mola, FNP   25 mg at 07/10/13 0826  . lithium carbonate capsule 300 mg  300 mg Oral BH-q7a Neita Garnet, MD   300 mg at 07/10/13 1158  . lithium carbonate capsule 600 mg  600 mg Oral QHS Clarene Reamer, MD   600 mg at 07/09/13 2130  . magnesium hydroxide (MILK OF MAGNESIA)  suspension 30 mL  30 mL Oral Daily PRN Clarene Reamer, MD   30 mL at 07/08/13 1612  . OLANZapine (ZYPREXA) tablet 10 mg  10 mg Oral Daily Neita Garnet, MD   10 mg at 07/10/13 4401  . traZODone (DESYREL) tablet 100 mg  100 mg Oral QHS PRN Neita Garnet, MD        Lab Results:  Results for orders placed during the hospital encounter of 07/02/13 (from the past 48 hour(s))  CARBAMAZEPINE LEVEL, TOTAL     Status: None   Collection Time    07/10/13  6:15 AM      Result Value Ref Range   Carbamazepine Lvl 7.6  4.0 - 12.0 ug/mL   Comment: Performed at [facility]  CBC WITH DIFFERENTIAL     Status: Abnormal   Collection Time    07/10/13  6:15 AM      Result Value Ref Range   WBC 13.8 (*) 4.0 - 10.5 K/uL   RBC 4.62  3.87 - 5.11 MIL/uL   Hemoglobin 13.9  12.0 - 15.0 g/dL   HCT 40.6  36.0 - 46.0 %   MCV 87.9  78.0 - 100.0 fL   MCH 30.1  26.0 - 34.0 pg   MCHC 34.2  30.0 - 36.0 g/dL   RDW 12.5  11.5 - 15.5 %   Platelets 315  150 - 400 K/uL   Neutrophils Relative % 57  43 - 77 %   Neutro Abs 7.9 (*) 1.7 - 7.7 K/uL   Lymphocytes Relative 34  12 - 46 %   Lymphs Abs 4.6 (*) 0.7 - 4.0 K/uL   Monocytes Relative 7  3 - 12 %   Monocytes Absolute 0.9  0.1 - 1.0 K/uL   Eosinophils Relative 2  0 - 5 %   Eosinophils Absolute 0.3  0.0 - 0.7 K/uL   Basophils Relative 0  0 - 1 %   Basophils Absolute 0.0  0.0 - 0.1 K/uL   Comment: Performed at [place] PANEL     Status: Abnormal   Collection Time    07/10/13  6:15 AM      Result Value Ref Range   Sodium 139  137 - 147 mEq/L   Potassium 4.1  3.7 - 5.3 mEq/L   Chloride 101  96 - 112 mEq/L   CO2 24  19 - 32 mEq/L   Glucose, Bld 89  70 - 99 mg/dL   BUN 8  6 - 23 mg/dL   Creatinine, Ser 0.57  0.50 - 1.10 mg/dL   Calcium 9.6  8.4 - 10.5 mg/dL   Total Protein 7.7  6.0 - 8.3 g/dL   Albumin 3.8  3.5 -  5.2 g/dL   AST 17  0 - 37 U/L   ALT 23  0 - 35 U/L   Alkaline Phosphatase 80  39 - 117 U/L    Total Bilirubin 0.2 (*) 0.3 - 1.2 mg/dL   GFR calc non Af Amer >90  >90 mL/min   GFR calc Af Amer >90  >90 mL/min   Comment: (NOTE)     The eGFR has been calculated using the CKD EPI equation.     This calculation has not been validated in all clinical situations.     eGFR's persistently <90 mL/min signify possible Chronic Kidney     Disease.     Performed at [place] LEVEL     Status: Abnormal   Collection Time    07/10/13  6:15 AM      Result Value Ref Range   Lithium Lvl 0.50 (*) 0.80 - 1.40 mEq/L   Comment: Performed at [facility]    Physical Findings: AIMS: Facial and Oral Movements Muscles of Facial Expression: None, normal Lips and Perioral Area: None, normal Jaw: None, normal Tongue: None, normal,Extremity Movements Upper (arms, wrists, hands, fingers): None, normal Lower (legs, knees, ankles, toes): None, normal, Trunk Movements Neck, shoulders, hips: None, normal, Overall Severity Severity of abnormal movements (highest score from questions above): None, normal Incapacitation due to abnormal movements: None, normal Patient's awareness of abnormal movements (rate only patient's report): No Awareness, Dental Status Current problems with teeth and/or dentures?: No Does patient usually wear dentures?: Yes  CIWA:    COWS:     Assessment-  Continues to improve- mood and affect better/brighter. Some improved participation in milieu.  Tolerating medications well without side effects. Sleep fair, but feels Trazodone helpful. Li levl 0.5, and Carbamazepine level  7.6 .  Treatment Plan Summary: Continue treatment -Increase TRAZODONE to 100 mgrs QHS PRN Insomnia  Discharge tomorrow likely as she stabilizes further.     Plan: As above, and continue inpatient support, milieu, group therapy.  Medical Decision Making Problem Points:  Established problem, stable/improving (1) Data Points:  Review or order clinical lab tests  (1) Review of medication regiment & side effects (2)  I certify that inpatient services furnished can reasonably be expected to improve the patient's condition.   COBOS, [place] 07/10/2013, 5:35 PM

## 2013-07-10 NOTE — Progress Notes (Signed)
D: Patient denies SI/HI and A/V hallucinations; patient reports sleep was poor ; reports appetite is good; reports energy level is low ; reports ability to pay attention is good; rates depression as 3/10; rates hopelessness 3/10; patient reports a lot of anxiety  A: Monitored q 15 minutes; patient encouraged to attend groups; patient educated about medications; patient given medications per physician orders; patient encouraged to express feelings and/or concerns  R: Patient lays in the bed all day long and does not attend groups; patient is appropriate to circumstances; patient's interaction with staff and peers is appropriate; patient was able to set goal to talk with staff 1:1 when having feelings of SI; patient is taking medications as prescribed and tolerating medications

## 2013-07-11 DIAGNOSIS — F332 Major depressive disorder, recurrent severe without psychotic features: Secondary | ICD-10-CM

## 2013-07-11 MED ORDER — TRAZODONE HCL 100 MG PO TABS: 100.0000 mg | ORAL_TABLET | Freq: Every evening | ORAL | Status: DC | PRN

## 2013-07-11 MED ORDER — LISINOPRIL-HYDROCHLOROTHIAZIDE 10-12.5 MG PO TABS: 1.0000 | ORAL_TABLET | Freq: Every day | ORAL | Status: DC

## 2013-07-11 MED ORDER — HYDROXYZINE HCL 25 MG PO TABS: 25.0000 mg | ORAL_TABLET | Freq: Four times a day (QID) | ORAL | Status: DC | PRN

## 2013-07-11 MED ORDER — LITHIUM CARBONATE 300 MG PO CAPS: 300.0000 mg | ORAL_CAPSULE | ORAL | Status: DC

## 2013-07-11 MED ORDER — OLANZAPINE 10 MG PO TABS
10.0000 mg | ORAL_TABLET | Freq: Every day | ORAL | Status: DC
Start: 2013-07-11 — End: 2015-02-24

## 2013-07-11 MED ORDER — CARBAMAZEPINE 200 MG PO TABS
200.0000 mg | ORAL_TABLET | Freq: Two times a day (BID) | ORAL | Status: DC
Start: 2013-07-11 — End: 2015-02-24

## 2013-07-11 MED ORDER — FLUOXETINE HCL 40 MG PO CAPS: 40.0000 mg | ORAL_CAPSULE | Freq: Every morning | ORAL | Status: DC

## 2013-07-11 MED ORDER — LITHIUM CARBONATE 300 MG PO CAPS: 600.0000 mg | ORAL_CAPSULE | Freq: Every day | ORAL | Status: DC

## 2013-07-11 NOTE — Progress Notes (Signed)
Adult Psychoeducational Group Note  Date:  07/11/2013 Time:  11:13 AM  Group Topic/Focus:  Overcoming Stress:   The focus of this group is to define stress and help patients assess their triggers.  Participation Level:  Did Not Attend   [name], Marisa D 07/11/2013, 11:13 AM

## 2013-07-11 NOTE — Progress Notes (Signed)
D: Pt is anxious in affect and mood. Pt attended group this evening. This is an improvement in this pt's attendance record for group. Pt is endorsing anxiety this evening. Pt's anxiety effectively controlled with vistaril. Pt appropriately interacts with others. Pt is currently denying any SI/HI/AVH. Pt is looking forward to discharging soon.  A: Writer administered scheduled and prn medications to pt. Continued support and availability as needed was extended to this pt. Staff continue to monitor pt with q415min checks.  R: No adverse drug reactions noted. Pt receptive to treatment. Pt remains safe at this time.

## 2013-07-11 NOTE — Progress Notes (Signed)
[facility] Discharge Plan :  Will you be returning to the same living situation after discharge: Yes,  returning home with boyfriend and reports boyfriend is supportive At discharge, do you have transportation home?:Yes,  boyfriend will pick pt up Do you have the ability to pay for your medications:Yes,  provided pt with samples and prescriptions and [facility] with [facility]  Release of information consent forms completed and in the chart;  Patient's signature needed at discharge.  Patient to Follow up at: Follow-up Information   Follow up with [facility] On 07/12/2013. (Walk in on this date for hospital discharge appointment. Walk in clinic is Monday - Friday 8 am - 3 pm. They will than schedule you for medication management and therapy. )    [address] information:   [address] [place], [place] [address] Phone: [phone] Fax: [phone]      Patient denies SI/HI:   Yes,  denies SI/HI    Safety Planning and Suicide Prevention discussed:  Yes,  discussed with pt and pt's boyfriend.  See suicide prevention education note.   Carmina MillerHorton, Deari Sessler Nicole 07/11/2013, 10:15 AM
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
## 2013-07-11 NOTE — Progress Notes (Signed)
The focus of this group is to educate the patient on the purpose and policies of crisis stabilization and provide a format to answer questions about their admission.  The group details unit policies and expectations of patients while admitted.  Patient did not attend 0900 nurse education orientation group this morning.   Patient was sleeping.

## 2013-07-16 NOTE — Progress Notes (Signed)
Patient Discharge Instructions:  After Visit Summary (AVS):   Faxed to:  07/16/13 Psychiatric Admission Assessment Note:   Faxed to:  07/16/13 Suicide Risk Assessment - Discharge Assessment:   Faxed to:  07/16/13 Faxed/Sent to the Next Level Care provider:  07/16/13 Fax[facility] to [facility] @ [phone]  Jerelene ReddenSheena E [place], 07/16/2013, 2:31 PM

## 2013-07-27 NOTE — Discharge Summary (Signed)
Physician Discharge Summary Note  Patient:  Marisa Sherman is an 53 y.o., female MRN:  [id number] DOB:  1960-02-17 Patient phone:  [phone] (home)  Patient address:   [address] [place] [id number],  Total Time spent with patient: 30 minutes  Date of Admission:  07/02/2013 Date of Discharge: 07/11/2013   Reason for Admission:  MDD with SI  Discharge Diagnoses: Active Problems:   Bipolar 1 disorder, depressed   Psychiatric Specialty Exam: Physical Exam  Review of Systems  Constitutional: Negative.   HENT: Negative.   Eyes: Negative.   Respiratory: Negative.   Cardiovascular: Negative.   Gastrointestinal: Negative.   Genitourinary: Negative.   Musculoskeletal: Negative.   Skin: Negative.   Neurological: Negative.   Endo/Heme/Allergies: Negative.   Psychiatric/Behavioral: Positive for depression. The patient is nervous/anxious.     Blood pressure 133/85, pulse 76, temperature 97.7 F (36.5 C), temperature source Oral, resp. rate 16, height 5\' 5"  (1.651 m), weight 85.276 kg (188 lb).Body mass index is 31.28 kg/(m^2).   General Appearance: Fairly Groomed   Patent attorneyye Contact:: Good   Speech: Normal Rate   Volume: Normal   Mood: Describes as improved and "OK" today   Affect: Appropriate   Thought Process: Linear   Orientation: Full (Time, Place, and Person)   Thought Content: denies any psychotic symptoms   Suicidal Thoughts: No- denies any SI   Homicidal Thoughts: No- denies any HI   Memory: NA   Judgement: Fair   Insight: Fair   Psychomotor Activity: Normal   Concentration: Good   Recall: Good   Fund of Knowledge:Good   Language: Good   Akathisia: No   Handed: Right   AIMS (if indicated):   Assets: Desire for Improvement  Housing  Social Support   Sleep: Number of Hours: 4.5    Musculoskeletal:  Strength & Muscle Tone: within normal limits  Gait & Station: normal  Patient leans: N/A   DSM5: Depressive Disorders:  Major Depressive Disorder -  Severe (296.23)  Axis Diagnosis:   AXIS I:  Major Depression, Recurrent severe AXIS II:  Deferred AXIS III:   Past Medical History  Diagnosis Date  . Bipolar disorder   . Hypertension    AXIS IV:  other psychosocial or environmental problems and problems related to social environment AXIS V:  51-60 moderate symptoms  Level of Care:  OP  Hospital Course:  Marisa Sherman says she has been diagnosed with Bipolar disorder and her medications work, but she moved to [place] one month ago and has not found a new doctor to prescribe her meds and has been out of them for a month. About a week ago she started getting more depressed and having suicidal thoughts that have worsened to the point she is afraid she will taking an overdose. She has a history of suicidal attempts.She moved here to be with her boyfriend who is supportive.  During Hospitalization: Medications managed, psychoeducation, group and individual therapy. Pt currently denies SI, HI, and Psychosis. At discharge, pt rates anxiety and depression as minimal. Pt states that he does have a good supportive home environment and will followup [facility]. Affirms agreement with medication regimen and discharge plan. Denies other physical and psychological concerns at time of discharge.   Consults:  None  Significant Diagnostic Studies:  None  Discharge Vitals:   Blood pressure 133/85, pulse 76, temperature 97.7 F (36.5 C), temperature source Oral, resp. rate 16, height 5\' 5"  (1.651 m), weight 85.276 kg (188  lb). Body mass index is 31.28 kg/(m^2). Lab Results:   No results found for this or any previous visit (from the past 72 hour(s)).  Physical Findings: AIMS: Facial and Oral Movements Muscles of Facial Expression: None, normal Lips and Perioral Area: None, normal Jaw: None, normal Tongue: None, normal,Extremity Movements Upper (arms, wrists, hands, fingers): None, normal Lower (legs, knees, ankles,  toes): None, normal, Trunk Movements Neck, shoulders, hips: None, normal, Overall Severity Severity of abnormal movements (highest score from questions above): None, normal Incapacitation due to abnormal movements: None, normal Patient's awareness of abnormal movements (rate only patient's report): No Awareness, Dental Status Current problems with teeth and/or dentures?: No Does patient usually wear dentures?: Yes  CIWA:    COWS:     Psychiatric Specialty Exam: See Psychiatric Specialty Exam and Suicide Risk Assessment completed by Attending Physician prior to discharge.  Discharge destination:  Home  Is patient on multiple antipsychotic therapies at discharge:  No   Has Patient had three or more failed trials of antipsychotic monotherapy by history:  No  Recommended Plan for Multiple Antipsychotic Therapies: NA     Medication List       Indication   carbamazepine 200 MG tablet  Commonly known as:  TEGRETOL  Take 1 tablet (200 mg total) by mouth 2 (two) times daily.   Indication:  mood stabilization     FLUoxetine 40 MG capsule  Commonly known as:  PROZAC  Take 1 capsule (40 mg total) by mouth every morning.   Indication:  mood stabilization     hydrOXYzine 25 MG tablet  Commonly known as:  ATARAX/VISTARIL  Take 1 tablet (25 mg total) by mouth every 6 (six) hours as needed for anxiety.   Indication:  anxiety     lisinopril-hydrochlorothiazide 10-12.5 MG per tablet  Commonly known as:  PRINZIDE,ZESTORETIC  Take 1 tablet by mouth daily.   Indication:  High Blood Pressure     lithium carbonate 300 MG capsule  Take 2 capsules (600 mg total) by mouth at bedtime.   Indication:  mood stabilization     lithium carbonate 300 MG capsule  Take 1 capsule (300 mg total) by mouth every morning.   Indication:  mood stabilization     OLANZapine 10 MG tablet  Commonly known as:  ZYPREXA  Take 1 tablet (10 mg total) by mouth daily.   Indication:  mood stabilization      traZODone 100 MG tablet  Commonly known as:  DESYREL  Take 1 tablet (100 mg total) by mouth at bedtime as needed for sleep (INSOMNIA).   Indication:  Trouble Sleeping           Follow-up Information   Follow up with [facility] On 07/12/2013. (Walk in on this date for hospital discharge appointment. Walk in clinic is Monday - Friday 8 am - 3 pm. They will than schedule you for medication management and therapy. )    Contact information:   [address], [place] [address] Phone: [phone] Fax: [phone]      Follow-up recommendations:  Activity:  As tolerated Diet:  Heart healthy with low sodium.  Comments:  Take all medications as prescribed. Keep all follow-up appointments as scheduled.  Do not consume alcohol or use illegal drugs while on prescription medications. Report any adverse effects from your medications to your primary care provider promptly.  In the event of recurrent symptoms or worsening symptoms, call 911, a crisis hotline, or go to the nearest emergency department for  evaluation.   Total Discharge Time:  Greater than 30 minutes.  Signed: Beau FannyWithrow, John C, FNP-BC 07/11/2013, 2:36 PM

## 2013-12-18 ENCOUNTER — Ambulatory Visit: Payer: Self-pay

## 2014-01-02 ENCOUNTER — Ambulatory Visit: Payer: Self-pay

## 2014-06-09 DIAGNOSIS — F331 Major depressive disorder, recurrent, moderate: Secondary | ICD-10-CM | POA: Diagnosis not present

## 2014-08-24 ENCOUNTER — Emergency Department
Admission: EM | Admit: 2014-08-24 | Discharge: 2014-08-24 | Disposition: A | Discharge status: Home / Self Care | Payer: Medicaid Other | Attending: Emergency Medicine | Admitting: Emergency Medicine

## 2014-08-24 ENCOUNTER — Other Ambulatory Visit: Payer: Self-pay

## 2014-08-24 ENCOUNTER — Encounter: Payer: Self-pay | Admitting: Emergency Medicine

## 2014-08-24 DIAGNOSIS — Z79899 Other long term (current) drug therapy: Secondary | ICD-10-CM | POA: Diagnosis not present

## 2014-08-24 DIAGNOSIS — Z720 Tobacco use: Secondary | ICD-10-CM | POA: Insufficient documentation

## 2014-08-24 DIAGNOSIS — I10 Essential (primary) hypertension: Secondary | ICD-10-CM | POA: Diagnosis not present

## 2014-08-24 DIAGNOSIS — R51 Headache: Secondary | ICD-10-CM | POA: Diagnosis present

## 2014-08-24 HISTORY — DX: Gastro-esophageal reflux disease without esophagitis: K21.9

## 2014-08-24 LAB — BASIC METABOLIC PANEL
Anion gap: 9 (ref 5–15)
BUN: 9 mg/dL (ref 6–20)
CALCIUM: 9.4 mg/dL (ref 8.9–10.3)
CO2: 27 mmol/L (ref 22–32)
Chloride: 105 mmol/L (ref 101–111)
Creatinine, Ser: 0.71 mg/dL (ref 0.44–1.00)
GFR calc Af Amer: >60 mL/min (ref >60)
Glucose, Bld: 121 mg/dL — ABNORMAL HIGH (ref 65–99)
Potassium: 2.9 mmol/L — CL (ref 3.5–5.1)
Sodium: 141 mmol/L (ref 135–145)

## 2014-08-24 LAB — CBC
HEMATOCRIT: 41.6 % (ref 35.0–47.0)
Hemoglobin: 14.3 g/dL (ref 12.0–16.0)
MCH: 28.4 pg (ref 26.0–34.0)
MCHC: 34.3 g/dL (ref 32.0–36.0)
MCV: 82.9 fL (ref 80.0–100.0)
Platelets: 263 10*3/uL (ref 150–440)
RBC: 5.02 MIL/uL (ref 3.80–5.20)
RDW: 13.5 % (ref 11.5–14.5)
WBC: 10.6 10*3/uL (ref 3.6–11.0)

## 2014-08-24 LAB — TROPONIN I: Troponin I: <0.03 ng/mL (ref <0.031)

## 2014-08-24 MED ORDER — LISINOPRIL-HYDROCHLOROTHIAZIDE 10-12.5 MG PO TABS: 1.0000 | ORAL_TABLET | Freq: Every day | ORAL | Status: DC

## 2014-08-24 MED ORDER — POTASSIUM CHLORIDE CRYS ER 20 MEQ PO TBCR
40.0000 meq | EXTENDED_RELEASE_TABLET | Freq: Once | ORAL | Status: AC
  Administered 2014-08-24: 40 meq via ORAL
  Filled 2014-08-24: qty 2

## 2014-08-24 MED ORDER — LISINOPRIL 20 MG PO TABS
10.0000 mg | ORAL_TABLET | Freq: Once | ORAL | Status: AC
  Administered 2014-08-24: 10 mg via ORAL
  Filled 2014-08-24: qty 1

## 2014-08-24 MED ORDER — HYDROCHLOROTHIAZIDE 25 MG PO TABS
12.5000 mg | ORAL_TABLET | Freq: Once | ORAL | Status: AC
  Administered 2014-08-24: 12.5 mg via ORAL
  Filled 2014-08-24: qty 1

## 2014-08-24 NOTE — ED Provider Notes (Signed)
[facility] Emergency Department Provider Note  ____________________________________________  Time seen: Approximately 1:00 PM  I have reviewed the triage vital signs and the nursing notes.   HISTORY  Chief Complaint Arm Pain; Headache; and numbing sensation     HPI Marisa Sherman is a 54 y.o. female with a history of hypertension and bipolar disorder who presents today with left arm pain, burning in her feet and her lips tingling over the past week. She has been out of all of her medications for the past month because she says that they expired. She has no appointment with her psychiatrist this coming Wednesday. However, she has not been able to follow up with her primary care doctor due to an issue with her insurance. She is trying to work out the issue with her insurance and that she may follow up with her primary care doctor and have ongoing follow-up for her medical problems. She said that the left arm aching is intermittent and thinks it is only on her blood pressure is high. Denies any chest pain or shortness of breath. Denies any headache at this time.   Past Medical History  Diagnosis Date  . Bipolar disorder   . Hypertension   . GERD (gastroesophageal reflux disease)     Patient Active Problem List   Diagnosis Date Noted  . Bipolar 1 disorder, depressed 07/02/2013    Past Surgical History  Procedure Laterality Date  . Cholecystectomy    . Tubal ligation      Current Outpatient Rx  Name  Route  Sig  Dispense  Refill  . carbamazepine (TEGRETOL) 200 MG tablet   Oral   Take 1 tablet (200 mg total) by mouth 2 (two) times daily.   60 tablet   0   . FLUoxetine (PROZAC) 40 MG capsule   Oral   Take 1 capsule (40 mg total) by mouth every morning.   30 capsule   0   . hydrOXYzine (ATARAX/VISTARIL) 25 MG tablet   Oral   Take 1 tablet (25 mg total) by mouth every 6 (six) hours as needed for anxiety.   30 tablet   0   .  lisinopril-hydrochlorothiazide (PRINZIDE,ZESTORETIC) 10-12.5 MG per tablet   Oral   Take 1 tablet by mouth daily.         Marland Kitchen lithium carbonate 300 MG capsule   Oral   Take 2 capsules (600 mg total) by mouth at bedtime.   60 capsule   0   . lithium carbonate 300 MG capsule   Oral   Take 1 capsule (300 mg total) by mouth every morning.   30 capsule   0   . OLANZapine (ZYPREXA) 10 MG tablet   Oral   Take 1 tablet (10 mg total) by mouth daily.   30 tablet   0   . traZODone (DESYREL) 100 MG tablet   Oral   Take 1 tablet (100 mg total) by mouth at bedtime as needed for sleep (INSOMNIA).   14 tablet   0     Allergies Codeine and Sulfa antibiotics  History reviewed. No pertinent family history.  Social History History  Substance Use Topics  . Smoking status: Current Every Day Smoker -- 1.00 packs/day    Types: Cigarettes  . Smokeless tobacco: Not on file  . Alcohol Use: No    Review of Systems Constitutional: No fever/chills Eyes: No visual changes. ENT: No sore throat. Cardiovascular: Denies chest pain. Respiratory: Denies shortness of breath. Gastrointestinal:  No abdominal pain.  No nausea, no vomiting.  No diarrhea.  No constipation. Genitourinary: Negative for dysuria. Musculoskeletal: Negative for back pain. Skin: Negative for rash. Neurological: Negative for  focal weakness.  10-point ROS otherwise negative.  ____________________________________________   PHYSICAL EXAM:  VITAL SIGNS: ED Triage Vitals  Enc Vitals Group     BP 08/24/14 1114 182/95 mmHg     Pulse Rate 08/24/14 1114 80     Resp 08/24/14 1114 20     Temp 08/24/14 1114 98.5 F (36.9 C)     Temp Source 08/24/14 1114 Oral     SpO2 08/24/14 1114 95 %     Weight 08/24/14 1114 185 lb (83.915 kg)     Height 08/24/14 1114 5\' 6"  (1.676 m)     Head Cir --      Peak Flow --      Pain Score 08/24/14 1116 5     Pain Loc --      Pain Edu? --      Excl. in GC? --     Constitutional: Alert  and oriented. Well appearing and in no acute distress. Eyes: Conjunctivae are normal. PERRL. EOMI. Head: Atraumatic. Nose: No congestion/rhinnorhea. Mouth/Throat: Mucous membranes are moist.  Oropharynx non-erythematous. Neck: No stridor.   Cardiovascular: Normal rate, regular rhythm. Grossly normal heart sounds.  Good peripheral circulation with palpable equal pulses in the radial as well as dorsalis pedis. Respiratory: Normal respiratory effort.  No retractions. Lungs CTAB. Gastrointestinal: Soft and nontender. No distention. No abdominal bruits. No CVA tenderness. Musculoskeletal: No lower extremity tenderness nor edema.  No joint effusions. Neurologic:  Normal speech and language. No gross focal neurologic deficits are appreciated. No gait instability. Skin:  Skin is warm, dry and intact. No rash noted. Psychiatric: Mood and affect are normal. Speech and behavior are normal.  ____________________________________________   LABS (all labs ordered are listed, but only abnormal results are displayed)  Labs Reviewed  BASIC METABOLIC PANEL - Abnormal; Notable for the following:    Potassium 2.9 (*)    Glucose, Bld 121 (*)    All other components within normal limits  CBC  TROPONIN I   ____________________________________________  EKG  ED ECG REPORT I, Arelia Longest, the attending physician, personally viewed and interpreted this ECG.   Date: 08/24/2014  EKG Time: 1307  Rate: 62  Rhythm: normal sinus rhythm  Axis: Normal axis  Intervals:none  ST&T Change: No ST elevations or depressions. No abnormal T-wave inversions.  ____________________________________________  RADIOLOGY   ____________________________________________   PROCEDURES    ____________________________________________   INITIAL IMPRESSION / ASSESSMENT AND PLAN / ED COURSE  Pertinent labs & imaging results that were available during my care of the patient were reviewed by me and considered in  my medical decision making (see chart for details).  ----------------------------------------- 4:16 PM on 08/24/2014 -----------------------------------------  Patient resting comfortably and says symptoms are now improved. Blood pressure is now in the 140s over 90s. We will give prescription for blood pressure medication. Patient is following up with her psychiatrist this Wednesday we'll have psych meds refilled then. To follow up with primary care. To discharge to home. ____________________________________________   FINAL CLINICAL IMPRESSION(S) / ED DIAGNOSES  Acute uncontrolled hypertension. Initial visit.    Myrna Blazer, MD 08/24/14 [phone]

## 2014-08-24 NOTE — ED Notes (Addendum)
Left arm pain, feet burning, lips tingling for last week. States this happens when her BP is elevated. States she owes MD money and did not call due to that reason. Has been out of her BP meds for last month due to them expiring.

## 2014-08-24 NOTE — ED Notes (Signed)
Pt presents to the ER from home with complaints of headache and left arm pain for about a week. Pt reports she has not taken her medication for BP for about a month, pt reports she does not have the money to pay for it. Pt denies any chest pain, denies any shortness of breath. Pt talks in complete sentences no respiratory distress noted.

## 2014-08-24 NOTE — Discharge Instructions (Signed)

## 2014-09-19 ENCOUNTER — Encounter: Payer: Self-pay | Admitting: Emergency Medicine

## 2014-09-19 ENCOUNTER — Emergency Department
Admission: EM | Admit: 2014-09-19 | Discharge: 2014-09-19 | Disposition: A | Discharge status: Home / Self Care | Payer: Medicaid Other | Attending: Emergency Medicine | Admitting: Emergency Medicine

## 2014-09-19 DIAGNOSIS — L299 Pruritus, unspecified: Secondary | ICD-10-CM | POA: Diagnosis not present

## 2014-09-19 DIAGNOSIS — Z720 Tobacco use: Secondary | ICD-10-CM | POA: Diagnosis not present

## 2014-09-19 DIAGNOSIS — Z79899 Other long term (current) drug therapy: Secondary | ICD-10-CM | POA: Diagnosis not present

## 2014-09-19 DIAGNOSIS — I10 Essential (primary) hypertension: Secondary | ICD-10-CM | POA: Diagnosis not present

## 2014-09-19 DIAGNOSIS — F319 Bipolar disorder, unspecified: Secondary | ICD-10-CM | POA: Diagnosis not present

## 2014-09-19 MED ORDER — HYDROXYZINE HCL 50 MG PO TABS: 50.0000 mg | ORAL_TABLET | Freq: Three times a day (TID) | ORAL | Status: DC | PRN

## 2014-09-19 MED ORDER — HYDROXYZINE HCL 50 MG PO TABS
50.0000 mg | ORAL_TABLET | Freq: Once | ORAL | Status: AC
  Administered 2014-09-19: 50 mg via ORAL
  Filled 2014-09-19: qty 1

## 2014-09-19 NOTE — ED Provider Notes (Signed)
[facility] Emergency Department Provider Note  ____________________________________________  Time seen: Approximately 11:28 AM  I have reviewed the triage vital signs and the nursing notes.   HISTORY  Chief Complaint Pruritis    HPI Marisa Sherman is a 54 y.o. female patient complaining of 2 weeks of itching. Patient denies any new food present hygiene lunch products. Patient stated there is no pattern to the itching is*symptom one part her body for couple hours gets relieved with Benadryl and and then reoccurs another part her body. He has not noticed any rash. Patient states an extended copious ago secondary to anxiety. Patient rated her pain discomfort as a 6/10.  Past Medical History  Diagnosis Date  . Bipolar disorder   . Hypertension   . GERD (gastroesophageal reflux disease)     Patient Active Problem List   Diagnosis Date Noted  . Bipolar 1 disorder, depressed 07/02/2013    Past Surgical History  Procedure Laterality Date  . Cholecystectomy    . Tubal ligation      Current Outpatient Rx  Name  Route  Sig  Dispense  Refill  . carbamazepine (TEGRETOL) 200 MG tablet   Oral   Take 1 tablet (200 mg total) by mouth 2 (two) times daily.   60 tablet   0   . FLUoxetine (PROZAC) 40 MG capsule   Oral   Take 1 capsule (40 mg total) by mouth every morning.   30 capsule   0   . hydrOXYzine (ATARAX/VISTARIL) 25 MG tablet   Oral   Take 1 tablet (25 mg total) by mouth every 6 (six) hours as needed for anxiety.   30 tablet   0   . lisinopril-hydrochlorothiazide (PRINZIDE,ZESTORETIC) 10-12.5 MG per tablet   Oral   Take 1 tablet by mouth daily.   30 tablet   0   . lithium carbonate 300 MG capsule   Oral   Take 2 capsules (600 mg total) by mouth at bedtime.   60 capsule   0   . lithium carbonate 300 MG capsule   Oral   Take 1 capsule (300 mg total) by mouth every morning.   30 capsule   0   . OLANZapine (ZYPREXA) 10 MG tablet   Oral   Take 1 tablet (10 mg total) by mouth daily.   30 tablet   0   . traZODone (DESYREL) 100 MG tablet   Oral   Take 1 tablet (100 mg total) by mouth at bedtime as needed for sleep (INSOMNIA).   14 tablet   0     Allergies Codeine and Sulfa antibiotics  No family history on file.  Social History Social History  Substance Use Topics  . Smoking status: Current Every Day Smoker -- 1.00 packs/day    Types: Cigarettes  . Smokeless tobacco: None  . Alcohol Use: No    Review of Systems Constitutional: No fever/chills Eyes: No visual changes. ENT: No sore throat. Cardiovascular: Denies chest pain. Respiratory: Denies shortness of breath. Gastrointestinal: No abdominal pain.  No nausea, no vomiting.  No diarrhea.  No constipation. Genitourinary: Negative for dysuria. Musculoskeletal: Negative for back pain. Skin: Negative for rash. Neurological: Negative for headaches, focal weakness or numbness. Psychiatric:Bipolar llergic/Immunilogical: Codeine and sulfa products  10-point ROS otherwise negative.  ____________________________________________   PHYSICAL EXAM:  VITAL SIGNS: ED Triage Vitals  Enc Vitals Group     BP 09/19/14 1118 131/89 mmHg     Pulse Rate 09/19/14 1118 101  Resp 09/19/14 1118 18     Temp 09/19/14 1118 98.7 F (37.1 C)     Temp Source 09/19/14 1118 Oral     SpO2 09/19/14 1118 100 %     Weight 09/19/14 1110 190 lb (86.183 kg)     Height 09/19/14 1110  (1.676 m)     Head Cir --      Peak Flow --      Pain Score 09/19/14 1118 6     Pain Loc --      Pain Edu? --      Excl. in GC? --     Constitutional: Alert and oriented. Well appearing and in no acute distress. Eyes: Conjunctivae are normal. PERRL. EOMI. Head: Atraumatic. Nose: No congestion/rhinnorhea. Mouth/Throat: Mucous membranes are moist.  Oropharynx non-erythematous. Neck: No stridor. No cervical spine tenderness to palpation. Hematological/Lymphatic/Immunilogical: No cervical  lymphadenopathy. Cardiovascular: Normal rate, regular rhythm. Grossly normal heart sounds.  Good peripheral circulation. Respiratory: Normal respiratory effort.  No retractions. Lungs CTAB. Gastrointestinal: Soft and nontender. No distention. No abdominal bruits. No CVA tenderness. Musculoskeletal: No lower extremity tenderness nor edema.  No joint effusions. Neurologic:  Normal speech and language. No gross focal neurologic deficits are appreciated. No gait instability. Skin:  Skin is warm, dry and intact. No rash noted. Signs of excoriation posterior neck and anterior chest Psychiatric: Mood and affect are normal. Speech and behavior are normal.  ____________________________________________   LABS (all labs ordered are listed, but only abnormal results are displayed)  Labs Reviewed - No data to display ____________________________________________  EKG   ____________________________________________  RADIOLOGY   ____________________________________________   PROCEDURES  Procedure(s) performed: None  Critical Care performed: No  ____________________________________________   INITIAL IMPRESSION / ASSESSMENT AND PLAN / ED COURSE  Pertinent labs & imaging results that were available during my care of the patient were reviewed by me and considered in my medical decision making (see chart for details).  Pruritus, etiology unknown. Patient given a prescription for Atarax for itching and anxiety. Patient advised follow-up family doctor for definitive evaluation and continued treatment. ____________________________________________   FINAL CLINICAL IMPRESSION(S) / ED DIAGNOSES  Final diagnoses:  Pruritic disorder      Joni Reining, PA-C 09/19/14 1142  Darien Ramus, MD 09/19/14 [phone]

## 2014-09-19 NOTE — Discharge Instructions (Signed)
Pruritus  °Pruritus is an itch. There are many different problems that can cause an itch. Dry skin is one of the most common causes of itching. Most cases of itching do not require medical attention.  °HOME CARE INSTRUCTIONS  °Make sure your skin is moistened on a regular basis. A moisturizer that contains petroleum jelly is best for keeping moisture in your skin. If you develop a rash, you may try the following for relief:  °· Use corticosteroid cream. °· Apply cool compresses to the affected areas. °· Bathe with Epsom salts or baking soda in the bathwater. °· Soak in colloidal oatmeal baths. These are available at your pharmacy. °· Apply baking soda paste to the rash. Stir water into baking soda until it reaches a paste-like consistency. °· Use an anti-itch lotion. °· Take over-the-counter diphenhydramine medicine by mouth as the instructions direct. °· Avoid scratching. Scratching may cause the rash to become infected. If itching is very bad, your caregiver may suggest prescription lotions or creams to lessen your symptoms. °· Avoid hot showers, which can make itching worse. A cold shower may help with itching as long as you use a moisturizer after the shower. °SEEK MEDICAL CARE IF: °The itching does not go away after several days. °Document Released: 09/22/2010 Document Revised: 05/27/2013 Document Reviewed: 09/22/2010 °ExitCare® Patient Information ©2015 [employer]. This information is not intended to replace advice given to you by your health care provider. Make sure you discuss any questions you have with your health care provider. ° °

## 2014-09-19 NOTE — ED Notes (Signed)
Pt presents with itching for two weeks. Taking benedry but not helping.no rash noted.

## 2014-09-19 NOTE — ED Notes (Signed)
States she developed generalized itching for about 2 weeks. No rash noticed. No resp distress noted. Min relief with benadryl

## 2014-09-25 DIAGNOSIS — F331 Major depressive disorder, recurrent, moderate: Secondary | ICD-10-CM | POA: Diagnosis not present

## 2014-09-30 DIAGNOSIS — F3130 Bipolar disorder, current episode depressed, mild or moderate severity, unspecified: Secondary | ICD-10-CM | POA: Diagnosis not present

## 2014-10-09 DIAGNOSIS — L258 Unspecified contact dermatitis due to other agents: Secondary | ICD-10-CM | POA: Diagnosis not present

## 2014-10-13 DIAGNOSIS — F259 Schizoaffective disorder, unspecified: Secondary | ICD-10-CM | POA: Diagnosis not present

## 2014-10-13 DIAGNOSIS — F3132 Bipolar disorder, current episode depressed, moderate: Secondary | ICD-10-CM | POA: Diagnosis not present

## 2014-10-13 DIAGNOSIS — Z79891 Long term (current) use of opiate analgesic: Secondary | ICD-10-CM | POA: Diagnosis not present

## 2014-10-16 DIAGNOSIS — L209 Atopic dermatitis, unspecified: Secondary | ICD-10-CM | POA: Diagnosis not present

## 2014-10-17 DIAGNOSIS — E784 Other hyperlipidemia: Secondary | ICD-10-CM | POA: Diagnosis not present

## 2014-10-17 DIAGNOSIS — Z23 Encounter for immunization: Secondary | ICD-10-CM | POA: Diagnosis not present

## 2014-10-17 DIAGNOSIS — I10 Essential (primary) hypertension: Secondary | ICD-10-CM | POA: Diagnosis not present

## 2014-10-28 DIAGNOSIS — L209 Atopic dermatitis, unspecified: Secondary | ICD-10-CM | POA: Diagnosis not present

## 2014-10-28 DIAGNOSIS — K210 Gastro-esophageal reflux disease with esophagitis: Secondary | ICD-10-CM | POA: Diagnosis not present

## 2014-11-06 DIAGNOSIS — F331 Major depressive disorder, recurrent, moderate: Secondary | ICD-10-CM | POA: Diagnosis not present

## 2014-12-29 DIAGNOSIS — F411 Generalized anxiety disorder: Secondary | ICD-10-CM | POA: Diagnosis not present

## 2014-12-29 DIAGNOSIS — F3132 Bipolar disorder, current episode depressed, moderate: Secondary | ICD-10-CM | POA: Diagnosis not present

## 2015-01-13 DIAGNOSIS — F411 Generalized anxiety disorder: Secondary | ICD-10-CM | POA: Diagnosis not present

## 2015-02-09 DIAGNOSIS — L299 Pruritus, unspecified: Secondary | ICD-10-CM | POA: Diagnosis not present

## 2015-02-09 DIAGNOSIS — L219 Seborrheic dermatitis, unspecified: Secondary | ICD-10-CM | POA: Diagnosis not present

## 2015-02-19 DIAGNOSIS — Z720 Tobacco use: Secondary | ICD-10-CM | POA: Diagnosis not present

## 2015-02-19 DIAGNOSIS — Z716 Tobacco abuse counseling: Secondary | ICD-10-CM | POA: Diagnosis not present

## 2015-02-19 DIAGNOSIS — E668 Other obesity: Secondary | ICD-10-CM | POA: Diagnosis not present

## 2015-02-19 DIAGNOSIS — I10 Essential (primary) hypertension: Secondary | ICD-10-CM | POA: Diagnosis not present

## 2015-02-24 ENCOUNTER — Ambulatory Visit (INDEPENDENT_AMBULATORY_CARE_PROVIDER_SITE_OTHER): Payer: Medicare Other | Admitting: Obstetrics and Gynecology

## 2015-02-24 ENCOUNTER — Encounter: Payer: Self-pay | Admitting: Obstetrics and Gynecology

## 2015-02-24 VITALS — BP 112/76 | HR 68 | Ht 66.0 in | Wt 198.2 lb

## 2015-02-24 DIAGNOSIS — N816 Rectocele: Secondary | ICD-10-CM | POA: Diagnosis not present

## 2015-02-24 DIAGNOSIS — R1900 Intra-abdominal and pelvic swelling, mass and lump, unspecified site: Secondary | ICD-10-CM | POA: Diagnosis not present

## 2015-02-24 DIAGNOSIS — N3946 Mixed incontinence: Secondary | ICD-10-CM | POA: Diagnosis not present

## 2015-02-24 DIAGNOSIS — N949 Unspecified condition associated with female genital organs and menstrual cycle: Secondary | ICD-10-CM | POA: Diagnosis not present

## 2015-02-24 DIAGNOSIS — N9489 Other specified conditions associated with female genital organs and menstrual cycle: Secondary | ICD-10-CM

## 2015-02-24 MED ORDER — DOCUSATE SODIUM 100 MG PO CAPS: 100.0000 mg | ORAL_CAPSULE | Freq: Two times a day (BID) | ORAL | Status: DC | PRN

## 2015-02-24 MED ORDER — OXYBUTYNIN CHLORIDE 5 MG PO TABS: 5.0000 mg | ORAL_TABLET | Freq: Two times a day (BID) | ORAL | Status: DC

## 2015-02-24 MED ORDER — OXYBUTYNIN CHLORIDE 5 MG PO TABS: 5.0000 mg | ORAL_TABLET | Freq: Three times a day (TID) | ORAL | Status: DC

## 2015-02-24 NOTE — Patient Instructions (Addendum)
1.  Post void residual testing reveals no urinary retention. 2.  Moderate rectocele is present, which causes chronic constipation symptoms 3.  There is no significant cystocele. 4.  Pelvic exam was notable for possible enlarged left ovary. 5.  History is consistent with an unstable bladder; this can be treated with medicine  PLAN: 1.  Ultrasound is scheduled to assess the uterus and ovaries. 2.  Start Detrol LA 5 mg every night. 3.  Start Colace 100 mg twice a day (stool softener). 4.  Start using Metamucil once a day (fiber additive). 5.  Return in 6 weeks for follow-up. 6.  If stress incontinence symptoms persist, we will send you to urology for further workup.

## 2015-02-24 NOTE — Progress Notes (Signed)
GYN ENCOUNTER NOTE  Subjective:       Marisa Sherman is a 55 y.o. [phone] female is here for gynecologic evaluation of the following issues:  1. Prolapsed bladder.     Marisa Sherman is a 55 yo [phone] female who was referred to the clinic by Dr. Cheryll Dessert for a possible cystocele. The patient reports having a cystocele for the past 20 years but has been able to feel a bulge with wiping for at least the past 3 years. She denies irritation or blood with wiping. She does have bladder pressure and urinary frequency, voiding every hour during the day with nocturia 3x a night. The patient complains of SUI with coughing, sneezing, laughing, and vomiting for 20 years, which is significant enough for her to have to change her underwear with occurrences. She also complains of urge incontinence as well as alternating diarrhea and constipation. She admits to splinting to help with BMs. Of note, the patient has a history of smoking 1.5ppd on and off for the past 40 years, but she plans to quit soon.   Gynecologic History No LMP recorded. Patient is postmenopausal. Contraception: NA Last Pap: NA Last mammogram: NA  Obstetric History OB History  Gravida Para Term Preterm AB SAB TAB Ectopic Multiple Living  # Outcome Date GA Lbr Len/2nd Weight Sex Delivery Anes PTL Lv  4 Term 1994   8 lb 1.8 oz (3.679 kg) M Vag-Spont   Y  3 Term 1990   7 lb (3.175 kg) F Vag-Spont   Y  2 SAB 1982          1 Term 1980   7 lb 9.6 oz (3.447 kg) M Vag-Spont   Y      Past Medical History  Diagnosis Date  . Bipolar disorder (HCC)   . Hypertension   . GERD (gastroesophageal reflux disease)     Past Surgical History  Procedure Laterality Date  . Cholecystectomy    . Tubal ligation    . Tonsillectomy    . Dilation and curettage of uterus      Current Outpatient Prescriptions on File Prior to Visit  Medication Sig Dispense Refill  . FLUoxetine (PROZAC) 40 MG capsule Take 1 capsule (40 mg total) by  mouth every morning. 30 capsule 0  . hydrOXYzine (ATARAX/VISTARIL) 50 MG tablet Take 1 tablet (50 mg total) by mouth 3 (three) times daily as needed. 30 tablet 0  . lisinopril-hydrochlorothiazide (PRINZIDE,ZESTORETIC) 10-12.5 MG per tablet Take 1 tablet by mouth daily. 30 tablet 0  . traZODone (DESYREL) 100 MG tablet Take 1 tablet (100 mg total) by mouth at bedtime as needed for sleep (INSOMNIA). 14 tablet 0   No current facility-administered medications on file prior to visit.    Allergies  Allergen Reactions  . Codeine Nausea Only  . Lithium Itching  . Sulfa Antibiotics Nausea And Vomiting    Social History   Social History  . Marital Status: Single    Spouse Name: N/A  . Number of Children: N/A  . Years of Education: N/A   Occupational History  . Not on file.   Social History Main Topics  . Smoking status: Current Every Day Smoker -- 1.50 packs/day    Types: Cigarettes  . Smokeless tobacco: Not on file  . Alcohol Use: No  . Drug Use: No  . Sexual Activity: Yes    Birth Control/ Protection: Surgical   Other  Topics Concern  . Not on file   Social History Narrative    Family History  Problem Relation Age of Onset  . Cancer Neg Hx   . Diabetes Neg Hx   . Heart disease Neg Hx     The following portions of the patient's history were reviewed and updated as appropriate: allergies, current medications, past family history, past medical history, past social history, past surgical history and problem list.  Review of Systems Review of Systems - General ROS: negative for - chills, fatigue, fever, headache, weight change, sleep change Gastrointestinal ROS: Positive for - change in bowel, alternating constipation and diarrhea Genito-Urinary ROS: Positive for urinary incontinence, frequency, nocturia, bladder pressure; negative for - pelvic pain    Objective:   BP 112/76 mmHg  Pulse 68  Ht  (1.676 m)  Wt 198 lb 3.2 oz (89.903 kg)  BMI 32.01  kg/m2  CONSTITUTIONAL: Well-developed, well-nourished female in no acute distress.  HENT:  Normocephalic, atraumatic.  NEUROLGIC: Alert and oriented to person, place, and time. PSYCHIATRIC: Normal mood and affect. Normal behavior. Normal judgment and thought content. CARDIOVASCULAR:Not Examined RESPIRATORY: Not Examined BREASTS: Not Examined ABDOMEN: Soft, non distended; Non tender.  No Organomegaly. LLQ tenderness on palpation 1/4 PELVIC:  External genitalia-normal . BUS-normal  Vagina- good estrogen effect; No cystocele; Moderate rectocele  Cervix-normal; no cervical motion tenderness  Uterus-No uterine prolapse; normal size, shape, and contour  Adnexa-Left adnexal fullness    PROCEDURE: PVR- 15cc   Assessment:   1. Pelvic fullness in female, left lower quadrant  - US Transvaginal Non-OB; Future - US Pelvis Complete; Future  2.  Moderate rectocele, symptomatic  3.  Mixed incontinence     Plan:   1.  Ultrasound. 2.  Colace 100 mg twice a day. 3.  Metamucil once a day. 4.  Increase water intake 5.  Oxybutynin 5 milligrams twice a day 6.  Return in 6 weeks for follow-upsame  Randa Lynn PA-S Herold Harms, MD   I have seen, interviewed, and examined the patient in conjunction with the [facility]A. student and affirm the diagnosis and management plan. Marisa Geier A. Jahvier Aldea, MD, FACOG   Note: This dictation was prepared with Dragon dictation along with smaller phrase technology. Any transcriptional errors that result from this process are unintentional.

## 2015-03-03 ENCOUNTER — Ambulatory Visit (INDEPENDENT_AMBULATORY_CARE_PROVIDER_SITE_OTHER): Payer: Medicare Other

## 2015-03-03 DIAGNOSIS — R1900 Intra-abdominal and pelvic swelling, mass and lump, unspecified site: Secondary | ICD-10-CM

## 2015-03-19 ENCOUNTER — Emergency Department
Admission: EM | Admit: 2015-03-19 | Discharge: 2015-03-19 | Disposition: A | Discharge status: Home / Self Care | Payer: Medicare Other | Attending: Emergency Medicine | Admitting: Emergency Medicine

## 2015-03-19 DIAGNOSIS — Z79899 Other long term (current) drug therapy: Secondary | ICD-10-CM | POA: Insufficient documentation

## 2015-03-19 DIAGNOSIS — J0100 Acute maxillary sinusitis, unspecified: Secondary | ICD-10-CM | POA: Insufficient documentation

## 2015-03-19 DIAGNOSIS — I10 Essential (primary) hypertension: Secondary | ICD-10-CM | POA: Diagnosis not present

## 2015-03-19 DIAGNOSIS — F17210 Nicotine dependence, cigarettes, uncomplicated: Secondary | ICD-10-CM | POA: Diagnosis not present

## 2015-03-19 DIAGNOSIS — R05 Cough: Secondary | ICD-10-CM | POA: Diagnosis present

## 2015-03-19 MED ORDER — PSEUDOEPH-BROMPHEN-DM 30-2-10 MG/5ML PO SYRP
5.0000 mL | ORAL_SOLUTION | Freq: Four times a day (QID) | ORAL | Status: DC | PRN
Start: 2015-03-19 — End: 2015-04-07

## 2015-03-19 MED ORDER — AMOXICILLIN 875 MG PO TABS: 875.0000 mg | ORAL_TABLET | Freq: Two times a day (BID) | ORAL | Status: DC

## 2015-03-19 NOTE — ED Provider Notes (Signed)
[facility] Emergency Department Provider Note  ____________________________________________  Time seen: Approximately 9:44 AM  I have reviewed the triage vital signs and the nursing notes.   HISTORY  Chief Complaint URI    HPI Marisa Sherman is a 55 y.o. female who presents for coughing and sinus headache for the past 3 days. Her sinus headache is intermittent but the coughing is constant throughout the day. The cough is nonproductive and she describes it as a "deep" cough. Her headache is worse with lying down. She has associated nausea, vomiting x 3, decreased appetite, rhinorrhea, itchy throat, and myalgias. She denies fever, diarrhea, shortness of breath, hemoptysis and stiff neck. The patient denies radiation of pain and temporality. She ranks the severity of her symptoms to be a 7/10. No improvement with Mucinex, Allegra, and Tylenol.  Past Medical History  Diagnosis Date  . Bipolar disorder (HCC)   . Hypertension   . GERD (gastroesophageal reflux disease)     Patient Active Problem List   Diagnosis Date Noted  . Mixed incontinence 02/24/2015  . Adnexal mass 02/24/2015  . Rectocele 02/24/2015  . Bipolar 1 disorder, depressed (HCC) 07/02/2013    Past Surgical History  Procedure Laterality Date  . Cholecystectomy    . Tubal ligation    . Tonsillectomy    . Dilation and curettage of uterus    . Carpal tunnel release Bilateral     Current Outpatient Rx  Name  Route  Sig  Dispense  Refill  . docusate sodium (COLACE) 100 MG capsule   Oral   Take 1 capsule (100 mg total) by mouth 2 (two) times daily as needed.   30 capsule   2   . FLUoxetine (PROZAC) 40 MG capsule   Oral   Take 1 capsule (40 mg total) by mouth every morning.   30 capsule   0   . hydrOXYzine (ATARAX/VISTARIL) 50 MG tablet   Oral   Take 1 tablet (50 mg total) by mouth 3 (three) times daily as needed.   30 tablet   0   . lisinopril-hydrochlorothiazide  (PRINZIDE,ZESTORETIC) 10-12.5 MG per tablet   Oral   Take 1 tablet by mouth daily.   30 tablet   0   . omeprazole (PRILOSEC) 20 MG capsule               . oxybutynin (DITROPAN) 5 MG tablet   Oral   Take 1 tablet (5 mg total) by mouth 2 (two) times daily.   60 tablet   5   . REXULTI 2 MG TABS                 Dispense as written.   . traZODone (DESYREL) 100 MG tablet   Oral   Take 1 tablet (100 mg total) by mouth at bedtime as needed for sleep (INSOMNIA).   14 tablet   0     Allergies Codeine; Lithium; and Sulfa antibiotics  Family History  Problem Relation Age of Onset  . Cancer Neg Hx   . Diabetes Neg Hx   . Heart disease Neg Hx     Social History Social History  Substance Use Topics  . Smoking status: Current Every Day Smoker -- 1.50 packs/day    Types: Cigarettes  . Smokeless tobacco: None  . Alcohol Use: No    Review of Systems Constitutional: Chills. Eyes: No visual changes, redness, drainage ENT: Itchy throat. No swelling or stiffness of neck Cardiovascular: Denies chest pain. Respiratory: Denies  shortness of breath and dyspnea on exertion Gastrointestinal: No abdominal pain.  No nausea, no vomiting.  No diarrhea.   Skin: Negative for rash. Neurological: Negative for focal weakness or numbness. 10-point ROS otherwise negative.  ____________________________________________   PHYSICAL EXAM:  VITAL SIGNS: ED Triage Vitals  Enc Vitals Group     BP 03/19/15 0913 134/85 mmHg     Pulse Rate 03/19/15 0913 92     Resp 03/19/15 0913 17     Temp 03/19/15 0913 98.5 F (36.9 C)     Temp Source 03/19/15 0913 Oral     SpO2 03/19/15 0913 95 %     Weight 03/19/15 0913 192 lb (87.091 kg)     Height 03/19/15 0913  (1.676 m)     Head Cir --      Peak Flow --      Pain Score 03/19/15 0914 7     Pain Loc --      Pain Edu? --      Excl. in GC? --     Constitutional: Alert and oriented. Appears fatigued and in no acute distress. Eyes:  Conjunctivae are normal.  Head: Atraumatic. Pain with percussion of frontal and maxillary sinuses Nose: Minimal congestion/rhinnorhea. Bilateral maxillary guarding Mouth/Throat: Mucous membranes are moist.  Oropharynx erythematous. Hematological/Lymphatic/Immunilogical: No cervical lymphadenopathy. Cardiovascular: Normal rate, regular rhythm. Grossly normal heart sounds. Respiratory: Normal respiratory effort.  No retractions. Lungs CTAB. Gastrointestinal: Soft and nontender. No distention. Neurologic:  Normal speech and language. No gross focal neurologic deficits are appreciated. Skin:  Skin is warm, dry and intact. No rash noted. Psychiatric: Mood and affect are normal. Speech and behavior are normal.  ____________________________________________   LABS (all labs ordered are listed, but only abnormal results are displayed)  Labs Reviewed - No data to display ____________________________________________  EKG   ____________________________________________  RADIOLOGY   ____________________________________________   PROCEDURES  Procedure(s) performed: None  Critical Care performed: No  ____________________________________________   INITIAL IMPRESSION / ASSESSMENT AND PLAN / ED COURSE  Pertinent labs & imaging results that were available during my care of the patient were reviewed by me and considered in my medical decision making (see chart for details).  Maxillary sinusitis. Given discharge care instructions. Patient a prescription for amoxicillin and Bromfed-DM. Patient advised follow-up family doctor if no improvement in her complaint in 3-5 days. ____________________________________________   FINAL CLINICAL IMPRESSION(S) / ED DIAGNOSES  Final diagnoses:  Acute maxillary sinusitis, recurrence not specified      Joni Reining, PA-C 03/19/15 1000  Arnaldo Natal, MD 03/19/15 1538

## 2015-03-19 NOTE — ED Notes (Signed)
States she developed cough and sinus pressure about 3 days ago with some body aches

## 2015-03-19 NOTE — ED Notes (Signed)
Pt c/o cough and congestion for the past 3 days.

## 2015-03-19 NOTE — Discharge Instructions (Signed)
Sinusitis, Adult  Sinusitis is redness, soreness, and puffiness (inflammation) of the air pockets in the bones of your face (sinuses). The redness, soreness, and puffiness can cause air and mucus to get trapped in your sinuses. This can allow germs to grow and cause an infection.   HOME CARE    Drink enough fluids to keep your pee (urine) clear or pale yellow.   Use a humidifier in your home.   Run a hot shower to create steam in the bathroom. Sit in the bathroom with the door closed. Breathe in the steam 3-4 times a day.   Put a warm, moist washcloth on your face 3-4 times a day, or as told by your doctor.   Use salt water sprays (saline sprays) to wet the thick fluid in your nose. This can help the sinuses drain.   Only take medicine as told by your doctor.  GET HELP RIGHT AWAY IF:    Your pain gets worse.   You have very bad headaches.   You are sick to your stomach (nauseous).   You throw up (vomit).   You are very sleepy (drowsy) all the time.   Your face is puffy (swollen).   Your vision changes.   You have a stiff neck.   You have trouble breathing.  MAKE SURE YOU:    Understand these instructions.   Will watch your condition.   Will get help right away if you are not doing well or get worse.     This information is not intended to replace advice given to you by your health care provider. Make sure you discuss any questions you have with your health care provider.     Document Released: 06/29/2007 Document Revised: 01/31/2014 Document Reviewed: 08/16/2011  Elsevier Interactive Patient Education 2016 [employer]

## 2015-03-24 ENCOUNTER — Ambulatory Visit (INDEPENDENT_AMBULATORY_CARE_PROVIDER_SITE_OTHER): Payer: Medicare Other | Admitting: Sports Medicine

## 2015-03-24 ENCOUNTER — Ambulatory Visit (INDEPENDENT_AMBULATORY_CARE_PROVIDER_SITE_OTHER): Payer: Medicare Other

## 2015-03-24 ENCOUNTER — Encounter: Payer: Self-pay | Admitting: Sports Medicine

## 2015-03-24 DIAGNOSIS — M21619 Bunion of unspecified foot: Secondary | ICD-10-CM

## 2015-03-24 DIAGNOSIS — M79673 Pain in unspecified foot: Secondary | ICD-10-CM | POA: Diagnosis not present

## 2015-03-24 NOTE — Patient Instructions (Signed)

## 2015-03-24 NOTE — Progress Notes (Signed)
Patient ID: Marisa Sherman, female   DOB: 21-May-1960, 55 y.o.   MRN: [id number] Subjective: Marisa Sherman is a 55 y.o. female patient who presents to office for evaluation of Right> Left bunion pain. Patient complains of progressive pain especially over the last year in the Right>Left foot that starts as pain over the bump with direct pressure and range of motion; patient now has difficulty fitting shoes comfortably. Pain is now interferring with daily activities.  Patient has also tried wider shoes with no improvement Patient denies any other pedal complaints.   Patient Active Problem List   Diagnosis Date Noted  . Mixed incontinence 02/24/2015  . Adnexal mass 02/24/2015  . Rectocele 02/24/2015  . Bipolar 1 disorder, depressed (HCC) 07/02/2013    Current Outpatient Prescriptions on File Prior to Visit  Medication Sig Dispense Refill  . brompheniramine-pseudoephedrine-DM 30-2-10 MG/5ML syrup Take 5 mLs by mouth 4 (four) times daily as needed. 120 mL 0  . docusate sodium (COLACE) 100 MG capsule Take 1 capsule (100 mg total) by mouth 2 (two) times daily as needed. 30 capsule 2  . FLUoxetine (PROZAC) 40 MG capsule Take 1 capsule (40 mg total) by mouth every morning. 30 capsule 0  . hydrOXYzine (ATARAX/VISTARIL) 50 MG tablet Take 1 tablet (50 mg total) by mouth 3 (three) times daily as needed. 30 tablet 0  . lisinopril-hydrochlorothiazide (PRINZIDE,ZESTORETIC) 10-12.5 MG per tablet Take 1 tablet by mouth daily. 30 tablet 0  . omeprazole (PRILOSEC) 20 MG capsule     . oxybutynin (DITROPAN) 5 MG tablet Take 1 tablet (5 mg total) by mouth 2 (two) times daily. 60 tablet 5  . REXULTI 2 MG TABS     . traZODone (DESYREL) 100 MG tablet Take 1 tablet (100 mg total) by mouth at bedtime as needed for sleep (INSOMNIA). 14 tablet 0   No current facility-administered medications on file prior to visit.    Allergies  Allergen Reactions  . Codeine Nausea Only  . Lithium Itching  . Sulfa Antibiotics Nausea And  Vomiting   Social History   Social History  . Marital Status: Single    Spouse Name: N/A  . Number of Children: N/A  . Years of Education: N/A   Occupational History  . Not on file.   Social History Main Topics  . Smoking status: Current Every Day Smoker -- 1.50 packs/day    Types: Cigarettes  . Smokeless tobacco: Not on file  . Alcohol Use: No  . Drug Use: No  . Sexual Activity: Yes    Birth Control/ Protection: Surgical   Other Topics Concern  . Not on file   Social History Narrative    Objective:  General: Alert and oriented x3 in no acute distress  Dermatology: No open lesions bilateral lower extremities, no webspace macerations, no ecchymosis bilateral, all nails x 10 are well manicured.  Vascular: Dorsalis Pedis 1/4 and Posterior Tibial pedal pulses 2/4, Capillary Fill Time 3 seconds, (+) pedal hair growth bilateral, no edema bilateral lower extremities, Temperature gradient within normal limits.  Neurology: Gross sensation intact via light touch bilateral, Protective sensation intact  with Semmes Weinstein Monofilament to all pedal sites, Position sense intact, vibratory intact bilateral, Deep tendon reflexes within normal limits bilateral, No babinski sign present bilateral. (-) Tinels sign. Subjective burning at both heels from previous condition.    Musculoskeletal: Mild tenderness with palpation right>left bunion deformity, no limitation or crepitus with range of motion, deformity reducible, tracking not trackbound-severe grade 3 to early grade  4 bunion deformity, there is no 1st ray hypermobility noted bilateral. Midtarsal, Subtalar joint, and ankle joint range of motion is within normal limits. On weightbearing exam, there is decreased 1st MTPJ rom Right>Left with functional limitus noted, there is medial arch collapse Right> Left on weightbearing, rearfoot slight valgus, forefoot slight abduction with HAV deformity supported on ground with no second toe crossover  deformity noted.     Xrays  Right/Left Foot    Impression: Intermetatarsal angle above normal limits R>L with dorsal 1st MTPH spur, + hammertoe deformity, no other acute findings, soft tissues within normal limits.       Assessment and Plan: Problem List Items Addressed This Visit    None    Visit Diagnoses    Foot pain, unspecified laterality    -  Primary    Relevant Orders    DG Foot 2 Views Left    DG Foot 2 Views Right    CBC with Differential    Basic Metabolic Panel    Rheumatoid factor    ANA, IFA Comprehensive Panel    Sedimentation Rate    C-reactive protein    Uric Acid    Bunion        R>L        -Complete examination performed -Xrays reviewed -Discussed treatement options; discussed HAV deformity;conservative and  Surgical management; risks, benefits, alternatives discussed. All patient's questions answered. -Rx blood work to r/o underlying arthritic condition due to severe bunion -Advised to quit smoking in order to have surgery -Recommend continue with good supportive shoes and inserts.  -Patient to return to office in 4 weeks to discuss labs, surgery, smoking cessation or sooner if condition worsens.  Asencion Islam, DPM

## 2015-04-07 ENCOUNTER — Ambulatory Visit (INDEPENDENT_AMBULATORY_CARE_PROVIDER_SITE_OTHER): Payer: Medicare Other | Admitting: Obstetrics and Gynecology

## 2015-04-07 ENCOUNTER — Encounter: Payer: Self-pay | Admitting: Obstetrics and Gynecology

## 2015-04-07 VITALS — BP 99/66 | HR 73 | Ht 66.0 in | Wt 195.2 lb

## 2015-04-07 DIAGNOSIS — M79673 Pain in unspecified foot: Secondary | ICD-10-CM | POA: Diagnosis not present

## 2015-04-07 DIAGNOSIS — N3946 Mixed incontinence: Secondary | ICD-10-CM | POA: Diagnosis not present

## 2015-04-07 DIAGNOSIS — N816 Rectocele: Secondary | ICD-10-CM

## 2015-04-07 DIAGNOSIS — N842 Polyp of vagina: Secondary | ICD-10-CM

## 2015-04-07 NOTE — Patient Instructions (Signed)
1. Restart oxybutynin 5 mg twice a day 2. Continue Colace and Metamucil as prescribed 3. Return in 6 months for follow-up on rectocele and for possible removal of right fundal polyp

## 2015-04-07 NOTE — Progress Notes (Signed)
GYN ENCOUNTER NOTE  Subjective:       Marisa Sherman is a 55 y.o. [phone] female is here for gynecologic evaluation of the following issues:  1. Urinary incontinence, mixed.   2. Rectocele 3. Abdominal ultrasound follow-up; left adnexal mass 4. Cystocele  The patient came in today with ongoing complaints of urinary incontinence (stress, urge, frequency, nocturia, pressure).  She also "feels a bulge" sometimes when she wipes; she was seen for bladder prolapse January 2017. Ultrasound from last month revealed normal findings of the pelvis  Gynecologic History No LMP recorded. Patient is postmenopausal. Contraception: tubal ligation Last Pap: unknown. Results were: N/A Last mammogram: 2016. Results were: normal  Obstetric History OB History  Gravida Para Term Preterm AB SAB TAB Ectopic Multiple Living  # Outcome Date GA Lbr Len/2nd Weight Sex Delivery Anes PTL Lv  4 Term 1994   8 lb 1.8 oz (3.679 kg) M Vag-Spont   Y  3 Term 1990   7 lb (3.175 kg) F Vag-Spont   Y  2 SAB 1982          1 Term 1980   7 lb 9.6 oz (3.447 kg) M Vag-Spont   Y      Past Medical History  Diagnosis Date  . Bipolar disorder (HCC)   . Hypertension   . GERD (gastroesophageal reflux disease)     Past Surgical History  Procedure Laterality Date  . Cholecystectomy    . Tubal ligation    . Tonsillectomy    . Dilation and curettage of uterus    . Carpal tunnel release Bilateral     Current Outpatient Prescriptions on File Prior to Visit  Medication Sig Dispense Refill  . CHANTIX 0.5 MG tablet     . docusate sodium (COLACE) 100 MG capsule Take 1 capsule (100 mg total) by mouth 2 (two) times daily as needed. 30 capsule 2  . FLUoxetine (PROZAC) 40 MG capsule Take 1 capsule (40 mg total) by mouth every morning. 30 capsule 0  . hydrocortisone 2.5 % cream     . hydrOXYzine (VISTARIL) 25 MG capsule     . ketoconazole (NIZORAL) 2 % cream     . ketoconazole (NIZORAL) 2 % shampoo     .  lisinopril-hydrochlorothiazide (PRINZIDE,ZESTORETIC) 10-12.5 MG per tablet Take 1 tablet by mouth daily. 30 tablet 0  . omeprazole (PRILOSEC) 20 MG capsule     . oxybutynin (DITROPAN) 5 MG tablet Take 1 tablet (5 mg total) by mouth 2 (two) times daily. 60 tablet 5  . REXULTI 2 MG TABS     . traZODone (DESYREL) 100 MG tablet Take 1 tablet (100 mg total) by mouth at bedtime as needed for sleep (INSOMNIA). 14 tablet 0   No current facility-administered medications on file prior to visit.    Allergies  Allergen Reactions  . Codeine Nausea Only  . Lithium Itching  . Sulfa Antibiotics Nausea And Vomiting    Social History   Social History  . Marital Status: Single    Spouse Name: N/A  . Number of Children: N/A  . Years of Education: N/A   Occupational History  . Not on file.   Social History Main Topics  . Smoking status: Current Every Day Smoker -- 1.50 packs/day    Types: Cigarettes  . Smokeless tobacco: Not on file  . Alcohol Use: No  . Drug Use: No  . Sexual Activity: Yes  Birth Control/ Protection: Surgical   Other Topics Concern  . Not on file   Social History Narrative    Family History  Problem Relation Age of Onset  . Cancer Neg Hx   . Diabetes Neg Hx   . Heart disease Neg Hx     The following portions of the patient's history were reviewed and updated as appropriate: allergies, current medications, past family history, past medical history, past social history, past surgical history and problem list.  Review of Systems Review of Systems - General ROS: negative for - chills, fatigue, fever, hot flashes, malaise or night sweats Hematological and Lymphatic ROS: negative for - bleeding problems or swollen lymph nodes Gastrointestinal ROS: negative for - abdominal pain, blood in stools, change in bowel habits and nausea/vomiting. Positive  Musculoskeletal ROS: negative for - joint pain, muscle pain or muscular weakness Genito-Urinary ROS: negative for - change  in menstrual cycle, dysmenorrhea, dyspareunia, dysuria, genital discharge, genital ulcers, hematuria, incontinence, irregular/heavy menses, nocturia or pelvic pain  Objective:   BP 99/66 mmHg  Pulse 73  Ht 5\' 6"  (1.676 m)  Wt 195 lb 3.2 oz (88.542 kg)  BMI 31.52 kg/m2 CONSTITUTIONAL: Well-developed, well-nourished female in no acute distress.  HENT:  Normocephalic, atraumatic.  NECK: Normal range of motion, supple, no masses.  Normal thyroid.  SKIN: Skin is warm and dry. No rash noted. Not diaphoretic. No erythema. No pallor. NEUROLGIC: Alert and oriented to person, place, and time. PSYCHIATRIC: Normal mood and affect. Normal behavior. Normal judgment and thought content. CARDIOVASCULAR:Not Examined RESPIRATORY: Not Examined BREASTS: Not Examined ABDOMEN: Soft, non distended; Non tender.  No Organomegaly. PELVIC:  External Genitalia: Normal  BUS: Normal  Vagina: 1 x 3 cm polyp on right vaginal sidewall (10:00 adjacent to cervix); mild cystocele; moderate rectocele  Cervix: Normal  Uterus: Normal size, shape, consistency, mobile; no significant prolapse  Adnexa: Normal  RV: Normal External exam; moderate rectocele   Bladder: Nontender; 1st degree cystocele MUSCULOSKELETAL: Normal range of motion. No tenderness.  No cyanosis, clubbing, or edema.     Assessment:   1.  Moderate rectocele, symptomatic. 2.  Unstable bladder, controlled with medication. 3.  First-degree cystocele, minimally symptomatic. 4.  Vaginal polyp   Plan:   Polyp: continue to monitor for changes; possible removal next appt. Continue Detrol LA 5 mg every night Continue using Metamucil once a day and Colace 100 mg twice a day  Return in 6 months for follow-up of polyp, urge incontinence, and rectocele    Octavia Heiraroline Gamble, PA-S Herold HarmsMartin A Khriz Liddy, MD   I have seen, interviewed, and examined the patient in conjunction with the [employer] and affirm the diagnosis and management plan.  Zackrey Dyar A. Deavin Forst, MD, FACOG   Note: This dictation was prepared with Dragon dictation along with smaller phrase technology. Any transcriptional errors that result from this process are unintentional.

## 2015-04-08 LAB — CBC WITH DIFFERENTIAL/PLATELET
BASOS ABS: 0 10*3/uL (ref 0.0–0.2)
Basos: 0 %
EOS (ABSOLUTE): 0.2 10*3/uL (ref 0.0–0.4)
Eos: 2 %
HEMOGLOBIN: 14.5 g/dL (ref 11.1–15.9)
Hematocrit: 42.4 % (ref 34.0–46.6)
Immature Grans (Abs): 0 10*3/uL (ref 0.0–0.1)
Immature Granulocytes: 0 %
LYMPHS ABS: 3.8 10*3/uL — AB (ref 0.7–3.1)
Lymphs: 37 %
MCH: 28.7 pg (ref 26.6–33.0)
MCHC: 34.2 g/dL (ref 31.5–35.7)
MCV: 84 fL (ref 79–97)
MONOCYTES: 6 %
MONOS ABS: 0.7 10*3/uL (ref 0.1–0.9)
NEUTROS ABS: 5.7 10*3/uL (ref 1.4–7.0)
Neutrophils: 55 %
PLATELETS: 340 10*3/uL (ref 150–379)
RBC: 5.05 x10E6/uL (ref 3.77–5.28)
RDW: 14.1 % (ref 12.3–15.4)
WBC: 10.4 10*3/uL (ref 3.4–10.8)

## 2015-04-08 LAB — RHEUMATOID FACTOR: Rhuematoid fact SerPl-aCnc: <10 IU/mL (ref 0.0–13.9)

## 2015-04-08 LAB — BASIC METABOLIC PANEL
BUN / CREAT RATIO: 13 (ref 9–23)
BUN: 11 mg/dL (ref 6–24)
CO2: 25 mmol/L (ref 18–29)
Calcium: 9.6 mg/dL (ref 8.7–10.2)
Chloride: 100 mmol/L (ref 96–106)
Creatinine, Ser: 0.83 mg/dL (ref 0.57–1.00)
GFR calc Af Amer: 91 mL/min/{1.73_m2} (ref >59)
GFR, EST NON AFRICAN AMERICAN: 79 mL/min/{1.73_m2} (ref >59)
GLUCOSE: 101 mg/dL — AB (ref 65–99)
POTASSIUM: 4.4 mmol/L (ref 3.5–5.2)
SODIUM: 143 mmol/L (ref 134–144)

## 2015-04-08 LAB — URIC ACID: Uric Acid: 8 mg/dL — ABNORMAL HIGH (ref 2.5–7.1)

## 2015-04-08 LAB — SEDIMENTATION RATE: SED RATE: 4 mm/h (ref 0–40)

## 2015-04-08 LAB — C-REACTIVE PROTEIN: CRP: 8.2 mg/L — ABNORMAL HIGH (ref 0.0–4.9)

## 2015-04-24 ENCOUNTER — Ambulatory Visit (INDEPENDENT_AMBULATORY_CARE_PROVIDER_SITE_OTHER): Payer: Medicare Other | Admitting: Sports Medicine

## 2015-04-24 ENCOUNTER — Encounter: Payer: Self-pay | Admitting: Sports Medicine

## 2015-04-24 DIAGNOSIS — M79673 Pain in unspecified foot: Secondary | ICD-10-CM

## 2015-04-24 DIAGNOSIS — M21619 Bunion of unspecified foot: Secondary | ICD-10-CM | POA: Diagnosis not present

## 2015-04-24 NOTE — Progress Notes (Signed)
Patient ID: Marisa Sherman, female   DOB: [birth year], 55 y.o.   MRN: [id number]  Subjective: Marisa Sherman is a 55 y.o. female patient who returns to office for follow up evaluation of Right> Left bunion pain. Patient states that pain is okay and area with no acute changes since last encounter. Patient is here to review results of labs and to discuss further plan of care for bunion deformities. Patient denies any other pedal complaints.   Patient Active Problem List   Diagnosis Date Noted  . Mixed incontinence 02/24/2015  . Rectocele 02/24/2015  . Bipolar 1 disorder, depressed (HCC) 07/02/2013    Current Outpatient Prescriptions on File Prior to Visit  Medication Sig Dispense Refill  . CHANTIX 0.5 MG tablet     . docusate sodium (COLACE) 100 MG capsule Take 1 capsule (100 mg total) by mouth 2 (two) times daily as needed. 30 capsule 2  . FLUoxetine (PROZAC) 40 MG capsule Take 1 capsule (40 mg total) by mouth every morning. 30 capsule 0  . hydrocortisone 2.5 % cream     . hydrOXYzine (VISTARIL) 25 MG capsule     . ketoconazole (NIZORAL) 2 % cream     . ketoconazole (NIZORAL) 2 % shampoo     . lisinopril-hydrochlorothiazide (PRINZIDE,ZESTORETIC) 10-12.5 MG per tablet Take 1 tablet by mouth daily. 30 tablet 0  . omeprazole (PRILOSEC) 20 MG capsule     . oxybutynin (DITROPAN) 5 MG tablet Take 1 tablet (5 mg total) by mouth 2 (two) times daily. 60 tablet 5  . REXULTI 2 MG TABS     . traZODone (DESYREL) 100 MG tablet Take 1 tablet (100 mg total) by mouth at bedtime as needed for sleep (INSOMNIA). 14 tablet 0   No current facility-administered medications on file prior to visit.    Allergies  Allergen Reactions  . Codeine Nausea Only  . Lithium Itching  . Sulfa Antibiotics Nausea And Vomiting   Social History   Social History  . Marital Status: Single    Spouse Name: N/A  . Number of Children: N/A  . Years of Education: N/A   Occupational History  . Not on file.   Social History  Main Topics  . Smoking status: Current Every Day Smoker -- 1.50 packs/day    Types: Cigarettes  . Smokeless tobacco: Not on file  . Alcohol Use: No  . Drug Use: No  . Sexual Activity: Yes    Birth Control/ Protection: Surgical   Other Topics Concern  . Not on file   Social History Narrative   Recent Results (from the past 2160 hour(s))  CBC with Differential     Status: Abnormal   Collection Time: 04/07/15  8:56 AM  Result Value Ref Range   WBC 10.4 3.4 - 10.8 x10E3/uL   RBC 5.05 3.77 - 5.28 x10E6/uL   Hemoglobin 14.5 11.1 - 15.9 g/dL   Hematocrit 40.9 81.1 - 46.6 %   MCV 84 79 - 97 fL   MCH 28.7 26.6 - 33.0 pg   MCHC 34.2 31.5 - 35.7 g/dL   RDW 91.4 78.2 - 95.6 %   Platelets 340 150 - 379 x10E3/uL   Neutrophils 55 %   Lymphs 37 %   Monocytes 6 %   Eos 2 %   Basos 0 %   Neutrophils Absolute 5.7 1.4 - 7.0 x10E3/uL   Lymphocytes Absolute 3.8 (H) 0.7 - 3.1 x10E3/uL   Monocytes Absolute 0.7 0.1 - 0.9 x10E3/uL   EOS (ABSOLUTE) 0.2  0.0 - 0.4 x10E3/uL   Basophils Absolute 0.0 0.0 - 0.2 x10E3/uL   Immature Granulocytes 0 %   Immature Grans (Abs) 0.0 0.0 - 0.1 x10E3/uL  Basic Metabolic Panel     Status: Abnormal   Collection Time: 04/07/15  8:56 AM  Result Value Ref Range   Glucose 101 (H) 65 - 99 mg/dL   BUN 11 6 - 24 mg/dL   Creatinine, Ser 9.140.83 0.57 - 1.00 mg/dL   GFR calc non Af Amer 79 >59 mL/min/1.73   GFR calc Af Amer 91 >59 mL/min/1.73   BUN/Creatinine Ratio 13 9 - 23   Sodium 143 134 - 144 mmol/L   Potassium 4.4 3.5 - 5.2 mmol/L   Chloride 100 96 - 106 mmol/L   CO2 25 18 - 29 mmol/L   Calcium 9.6 8.7 - 10.2 mg/dL  Rheumatoid factor     Status: None   Collection Time: 04/07/15  8:56 AM  Result Value Ref Range   Rhuematoid fact SerPl-aCnc <10.0 0.0 - 13.9 IU/mL  Sedimentation Rate     Status: None   Collection Time: 04/07/15  8:56 AM  Result Value Ref Range   Sed Rate 4 0 - 40 mm/hr  C-reactive protein     Status: Abnormal   Collection Time: 04/07/15  8:56  AM  Result Value Ref Range   CRP 8.2 (H) 0.0 - 4.9 mg/L  Uric Acid     Status: Abnormal   Collection Time: 04/07/15  8:56 AM  Result Value Ref Range   Uric Acid 8.0 (H) 2.5 - 7.1 mg/dL    Comment:            Therapeutic target for gout patients: <6.0   Objective:  General: Alert and oriented x3 in no acute distress  Dermatology: No open lesions bilateral lower extremities, no webspace macerations, no ecchymosis bilateral, all nails x 10 are well manicured.  Vascular: Dorsalis Pedis 1/4 and Posterior Tibial pedal pulses 2/4, Capillary Fill Time 3 seconds, (+) pedal hair growth bilateral, no edema bilateral lower extremities, Temperature gradient within normal limits.  Neurology: Gross sensation intact via light touch bilateral, Protective sensation intact  with Semmes Weinstein Monofilament to all pedal sites, Position sense intact, vibratory intact bilateral, Deep tendon reflexes within normal limits bilateral, No babinski sign present bilateral. (-) Tinels sign. Subjective burning at both heels from previous condition. Unchanged from prior.    Musculoskeletal: Mild tenderness with palpation right>left bunion deformity, no limitation or crepitus with range of motion, deformity reducible, tracking not trackbound-severe grade 3 to early grade 4 bunion deformity, there is no 1st ray hypermobility noted bilateral. Midtarsal, Subtalar joint, and ankle joint range of motion is within normal limits. On weightbearing exam, there is decreased 1st MTPJ rom Right>Left with functional limitus noted, there is medial arch collapse Right> Left on weightbearing, rearfoot slight valgus, forefoot slight abduction with HAV deformity supported on ground with no second toe crossover deformity noted.         Assessment and Plan: Problem List Items Addressed This Visit    None    Visit Diagnoses    Bunion    -  Primary    Foot pain, unspecified laterality           -Complete examination performed -Labs  results reviewed -Advised patient to see her primary care doctor, Dr. Harl BowieMassoud for assistance with getting uric acid down to normal limits for surgery can even be considered. Advised patient that this may take several months.  Patient expressed understanding -Advised to quit smoking in order to have surgery; cessation offered.  -Recommend continue with good supportive shoes and inserts in the meantime and to ice as needed to the affected areas.  -Patient to return to office after her uric acid has improved and she has stopped smoking for re-consideration for possible bunion surgery or sooner if condition worsens.  Asencion Islam, DPM

## 2015-04-29 DIAGNOSIS — F411 Generalized anxiety disorder: Secondary | ICD-10-CM | POA: Diagnosis not present

## 2015-04-29 DIAGNOSIS — F3132 Bipolar disorder, current episode depressed, moderate: Secondary | ICD-10-CM | POA: Diagnosis not present

## 2015-05-07 DIAGNOSIS — K210 Gastro-esophageal reflux disease with esophagitis: Secondary | ICD-10-CM | POA: Diagnosis not present

## 2015-05-07 DIAGNOSIS — E668 Other obesity: Secondary | ICD-10-CM | POA: Diagnosis not present

## 2015-05-07 DIAGNOSIS — I10 Essential (primary) hypertension: Secondary | ICD-10-CM | POA: Diagnosis not present

## 2015-05-20 DIAGNOSIS — H25813 Combined forms of age-related cataract, bilateral: Secondary | ICD-10-CM | POA: Diagnosis not present

## 2015-05-25 DIAGNOSIS — H2511 Age-related nuclear cataract, right eye: Secondary | ICD-10-CM | POA: Diagnosis not present

## 2015-05-25 DIAGNOSIS — H268 Other specified cataract: Secondary | ICD-10-CM | POA: Diagnosis not present

## 2015-06-24 DIAGNOSIS — F411 Generalized anxiety disorder: Secondary | ICD-10-CM | POA: Diagnosis not present

## 2015-06-24 DIAGNOSIS — F3132 Bipolar disorder, current episode depressed, moderate: Secondary | ICD-10-CM | POA: Diagnosis not present

## 2015-07-02 DIAGNOSIS — H2512 Age-related nuclear cataract, left eye: Secondary | ICD-10-CM | POA: Diagnosis not present

## 2015-07-14 DIAGNOSIS — F3132 Bipolar disorder, current episode depressed, moderate: Secondary | ICD-10-CM | POA: Diagnosis not present

## 2015-07-14 DIAGNOSIS — F411 Generalized anxiety disorder: Secondary | ICD-10-CM | POA: Diagnosis not present

## 2015-07-27 DIAGNOSIS — H2512 Age-related nuclear cataract, left eye: Secondary | ICD-10-CM | POA: Diagnosis not present

## 2015-09-30 DIAGNOSIS — F411 Generalized anxiety disorder: Secondary | ICD-10-CM | POA: Diagnosis not present

## 2015-10-08 ENCOUNTER — Ambulatory Visit: Payer: Medicare Other | Admitting: Obstetrics and Gynecology

## 2015-10-20 DIAGNOSIS — F411 Generalized anxiety disorder: Secondary | ICD-10-CM | POA: Diagnosis not present

## 2015-10-29 DIAGNOSIS — M109 Gout, unspecified: Secondary | ICD-10-CM | POA: Diagnosis not present

## 2015-10-29 DIAGNOSIS — F17200 Nicotine dependence, unspecified, uncomplicated: Secondary | ICD-10-CM | POA: Diagnosis not present

## 2015-10-29 DIAGNOSIS — K219 Gastro-esophageal reflux disease without esophagitis: Secondary | ICD-10-CM | POA: Diagnosis not present

## 2015-10-29 DIAGNOSIS — G4700 Insomnia, unspecified: Secondary | ICD-10-CM | POA: Diagnosis not present

## 2015-10-29 DIAGNOSIS — Z1389 Encounter for screening for other disorder: Secondary | ICD-10-CM | POA: Diagnosis not present

## 2015-10-29 DIAGNOSIS — F319 Bipolar disorder, unspecified: Secondary | ICD-10-CM | POA: Diagnosis not present

## 2015-10-29 DIAGNOSIS — I10 Essential (primary) hypertension: Secondary | ICD-10-CM | POA: Diagnosis not present

## 2015-11-11 ENCOUNTER — Encounter: Payer: Self-pay | Admitting: *Deleted

## 2015-11-11 ENCOUNTER — Emergency Department
Admission: EM | Admit: 2015-11-11 | Discharge: 2015-11-11 | Disposition: A | Discharge status: Home / Self Care | Payer: Medicare Other | Attending: Emergency Medicine | Admitting: Emergency Medicine

## 2015-11-11 DIAGNOSIS — F17210 Nicotine dependence, cigarettes, uncomplicated: Secondary | ICD-10-CM | POA: Diagnosis not present

## 2015-11-11 DIAGNOSIS — M792 Neuralgia and neuritis, unspecified: Secondary | ICD-10-CM | POA: Insufficient documentation

## 2015-11-11 DIAGNOSIS — I10 Essential (primary) hypertension: Secondary | ICD-10-CM | POA: Insufficient documentation

## 2015-11-11 DIAGNOSIS — Z79899 Other long term (current) drug therapy: Secondary | ICD-10-CM | POA: Insufficient documentation

## 2015-11-11 DIAGNOSIS — M79672 Pain in left foot: Secondary | ICD-10-CM | POA: Diagnosis present

## 2015-11-11 MED ORDER — GABAPENTIN 300 MG PO CAPS: ORAL_CAPSULE | ORAL | 0 refills | Status: DC

## 2015-11-11 NOTE — ED Notes (Signed)
Pt alert and oriented X4, active, cooperative, pt in NAD. RR even and unlabored, color WNL.  Pt informed to return if any life threatening symptoms occur.   

## 2015-11-11 NOTE — ED Triage Notes (Addendum)
States feeling like her feet are on fire, states especially in her heels, states pins and needs all over her body, states symptoms for 1 month, states she has a hx of gout and has been on gout meds with no relief

## 2015-11-11 NOTE — ED Notes (Signed)
Pt finishing with registration at this time.

## 2015-11-11 NOTE — ED Notes (Signed)
Burning pain to bilateral arms and legs X 1 month. PT ambulates to most places, states pain interfering with her ability to walk places. Pt alert and oriented X4, active, cooperative, pt in NAD. RR even and unlabored, color WNL.

## 2015-11-11 NOTE — Discharge Instructions (Signed)
Please continue Tylenol as he did for pain. Take gabapentin as prescribed. Follow-up with primary care physician in 1 to 2 weeks. Return to the ER for any worsening symptoms urgent changes in health.

## 2015-11-11 NOTE — ED Provider Notes (Signed)
[facility]-EMERGENCY DEPARTMENT Provider Note   CSN: [id number] Arrival date & time: 11/11/15  1751     History   Chief Complaint Chief Complaint  Patient presents with  . Foot Pain    HPI Marisa Sherman is a 55 y.o. female presents to the emergency department for evaluation of bilateral feet pain. Patient has had 4-6 weeks of bilateral feet pain. She denies any trauma or injury. She describes the pain as numbness tingling, pins and needles. Pain is present throughout both feet and ankles. She denies any history of diabetes. She has been taking Tylenol, ibuprofen with no improvement. Pain is severe and increased with walking. She does not get much relief with sitting. She denies any swelling or redness or drainage from the lower legs. She denies any back pain. No weakness in the lower extremities.  HPI  Past Medical History:  Diagnosis Date  . Bipolar disorder (HCC)   . GERD (gastroesophageal reflux disease)   . Hypertension     Patient Active Problem List   Diagnosis Date Noted  . Mixed incontinence 02/24/2015  . Rectocele 02/24/2015  . Bipolar 1 disorder, depressed (HCC) 07/02/2013    Past Surgical History:  Procedure Laterality Date  . CARPAL TUNNEL RELEASE Bilateral   . CHOLECYSTECTOMY    . DILATION AND CURETTAGE OF UTERUS    . TONSILLECTOMY    . TUBAL LIGATION      OB History    Gravida Para Term Preterm AB Living   4 3 3   1 3    SAB TAB Ectopic Multiple Live Births   1       3       Home Medications    Prior to Admission medications   Medication Sig Start Date End Date Taking? Authorizing Provider  CHANTIX 0.5 MG tablet  02/24/15   Historical Provider, MD  docusate sodium (COLACE) 100 MG capsule Take 1 capsule (100 mg total) by mouth 2 (two) times daily as needed. 02/24/15   Prentice Docker Defrancesco, MD  FLUoxetine (PROZAC) 40 MG capsule Take 1 capsule (40 mg total) by mouth every morning. 07/11/13   Beau Fanny, FNP  gabapentin (NEURONTIN) 300 MG capsule  Please take 1 tab at night 4 days, then take 1 tab at night, one tab in the morning 4 days then increase to 1 tab 3 times daily 11/11/15   Evon Slack, PA-C  hydrocortisone 2.5 % cream  02/24/15   Historical Provider, MD  hydrOXYzine (VISTARIL) 25 MG capsule  02/24/15   Historical Provider, MD  ketoconazole (NIZORAL) 2 % cream  02/24/15   Historical Provider, MD  ketoconazole (NIZORAL) 2 % shampoo  02/24/15   Historical Provider, MD  lisinopril-hydrochlorothiazide (PRINZIDE,ZESTORETIC) 10-12.5 MG per tablet Take 1 tablet by mouth daily. 08/24/14   Myrna Blazer, MD  omeprazole (PRILOSEC) 20 MG capsule  12/05/14   Historical Provider, MD  oxybutynin (DITROPAN) 5 MG tablet Take 1 tablet (5 mg total) by mouth 2 (two) times daily. 02/24/15   Prentice Docker Defrancesco, MD  REXULTI 2 MG TABS  01/13/15   Historical Provider, MD  traZODone (DESYREL) 100 MG tablet Take 1 tablet (100 mg total) by mouth at bedtime as needed for sleep (INSOMNIA). 07/11/13   Beau Fanny, FNP    Family History Family History  Problem Relation Age of Onset  . Cancer Neg Hx   . Diabetes Neg Hx   . Heart disease Neg Hx     Social History Social History  Substance Use Topics  . Smoking status: Current Every Day Smoker    Packs/day: 1.50    Types: Cigarettes  . Smokeless tobacco: Not on file  . Alcohol use No     Allergies   Codeine; Lithium; and Sulfa antibiotics   Review of Systems Review of Systems  Constitutional: Negative for activity change, chills, fatigue and fever.  HENT: Negative for congestion, sinus pressure and sore throat.   Eyes: Negative for visual disturbance.  Respiratory: Negative for cough, chest tightness and shortness of breath.   Cardiovascular: Negative for chest pain and leg swelling.  Gastrointestinal: Negative for abdominal pain, diarrhea, nausea and vomiting.  Genitourinary: Negative for dysuria.  Musculoskeletal: Negative for arthralgias and gait problem.  Skin: Negative  for color change, rash and wound.  Neurological: Positive for numbness. Negative for weakness and headaches.  Hematological: Negative for adenopathy.  Psychiatric/Behavioral: Negative for agitation, behavioral problems and confusion.     Physical Exam Updated Vital Signs BP 135/80 (BP Location: Left Arm)   Pulse 77   Temp 98.2 F (36.8 C) (Oral)   Resp 18   Ht 5\' 6"  (1.676 m)   Wt 81.6 kg   SpO2 100%   BMI 29.05 kg/m   Physical Exam  Constitutional: She appears well-developed and well-nourished. No distress.  HENT:  Head: Normocephalic and atraumatic.  Eyes: Conjunctivae are normal.  Neck: Neck supple.  Cardiovascular: Normal rate and regular rhythm.   No murmur heard. Pulmonary/Chest: Effort normal and breath sounds normal. No respiratory distress.  Abdominal: Soft. There is no tenderness.  Musculoskeletal:  Examination of both lower extremity shows patient has full range of motion of the hips knees and ankles. There is no swelling warmth erythema. Patient has 2+ dorsalis pedis pulses bilaterally. There is no wound or skin breakdown noted. Sensation is intact distally.  Neurological: She is alert.  Skin: Skin is warm and dry. No rash noted. No erythema.  Psychiatric: She has a normal mood and affect. Her behavior is normal. Thought content normal.  Nursing note and vitals reviewed.    ED Treatments / Results  Labs (all labs ordered are listed, but only abnormal results are displayed) Labs Reviewed - No data to display  EKG  EKG Interpretation None       Radiology No results found.  Procedures Procedures (including critical care time)  Medications Ordered in ED Medications - No data to display   Initial Impression / Assessment and Plan / ED Course  I have reviewed the triage vital signs and the nursing notes.  Pertinent labs & imaging results that were available during my care of the patient were reviewed by me and considered in my medical decision  making (see chart for details).  Clinical Course    55 year old female with bilateral feet pain described as pins and needles. Symptoms have been present for 4-6 weeks. She's had no sign of infection, DVT, trauma/injury. Patient has an appointment with PCP 11/30/2015. She is placed on gabapentin 300 mg. She understands this medication may cause drowsiness.  Final Clinical Impressions(s) / ED Diagnoses   Final diagnoses:  Nerve pain  Peripheral neuropathic pain    New Prescriptions New Prescriptions   GABAPENTIN (NEURONTIN) 300 MG CAPSULE    Please take 1 tab at night 4 days, then take 1 tab at night, one tab in the morning 4 days then increase to 1 tab 3 times daily     Evon Slackhomas C Samrat Hayward, PA-C 11/11/15 1900    Nita Sicklearolina Veronese,  MD 11/11/15 1905

## 2015-11-16 DIAGNOSIS — F411 Generalized anxiety disorder: Secondary | ICD-10-CM | POA: Diagnosis not present

## 2015-11-27 ENCOUNTER — Emergency Department
Admission: EM | Admit: 2015-11-27 | Discharge: 2015-11-27 | Disposition: A | Discharge status: Home / Self Care | Payer: Medicare Other | Attending: Emergency Medicine | Admitting: Emergency Medicine

## 2015-11-27 ENCOUNTER — Emergency Department: Payer: Medicare Other

## 2015-11-27 ENCOUNTER — Encounter: Payer: Self-pay | Admitting: *Deleted

## 2015-11-27 DIAGNOSIS — M792 Neuralgia and neuritis, unspecified: Secondary | ICD-10-CM | POA: Diagnosis not present

## 2015-11-27 DIAGNOSIS — S9032XA Contusion of left foot, initial encounter: Secondary | ICD-10-CM | POA: Diagnosis not present

## 2015-11-27 DIAGNOSIS — Z136 Encounter for screening for cardiovascular disorders: Secondary | ICD-10-CM | POA: Diagnosis not present

## 2015-11-27 DIAGNOSIS — Y999 Unspecified external cause status: Secondary | ICD-10-CM | POA: Diagnosis not present

## 2015-11-27 DIAGNOSIS — Z79899 Other long term (current) drug therapy: Secondary | ICD-10-CM | POA: Insufficient documentation

## 2015-11-27 DIAGNOSIS — E876 Hypokalemia: Secondary | ICD-10-CM | POA: Diagnosis not present

## 2015-11-27 DIAGNOSIS — S99922A Unspecified injury of left foot, initial encounter: Secondary | ICD-10-CM | POA: Diagnosis present

## 2015-11-27 DIAGNOSIS — I10 Essential (primary) hypertension: Secondary | ICD-10-CM | POA: Diagnosis not present

## 2015-11-27 DIAGNOSIS — W2201XA Walked into wall, initial encounter: Secondary | ICD-10-CM | POA: Insufficient documentation

## 2015-11-27 DIAGNOSIS — F17210 Nicotine dependence, cigarettes, uncomplicated: Secondary | ICD-10-CM | POA: Insufficient documentation

## 2015-11-27 DIAGNOSIS — M7989 Other specified soft tissue disorders: Secondary | ICD-10-CM | POA: Diagnosis not present

## 2015-11-27 DIAGNOSIS — F17200 Nicotine dependence, unspecified, uncomplicated: Secondary | ICD-10-CM | POA: Diagnosis not present

## 2015-11-27 DIAGNOSIS — Y929 Unspecified place or not applicable: Secondary | ICD-10-CM | POA: Diagnosis not present

## 2015-11-27 DIAGNOSIS — Y939 Activity, unspecified: Secondary | ICD-10-CM | POA: Insufficient documentation

## 2015-11-27 MED ORDER — NAPROXEN 500 MG PO TABS: 500.0000 mg | ORAL_TABLET | Freq: Two times a day (BID) | ORAL | 0 refills | Status: DC

## 2015-11-27 MED ORDER — HYDROCODONE-ACETAMINOPHEN 5-325 MG PO TABS
1.0000 | ORAL_TABLET | Freq: Once | ORAL | Status: AC
  Administered 2015-11-27: 1 via ORAL
  Filled 2015-11-27: qty 1

## 2015-11-27 MED ORDER — HYDROCODONE-ACETAMINOPHEN 5-325 MG PO TABS: 1.0000 | ORAL_TABLET | ORAL | 0 refills | Status: AC | PRN

## 2015-11-27 NOTE — Discharge Instructions (Signed)
Follow up with podiatry. Return to the ER for symptoms that change or worsen if you are unable to schedule an appointment with podiatry or primary care.

## 2015-11-27 NOTE — ED Notes (Signed)
Pain to left great toe after hitting toe on wall this AM. Bruising present to great toe. Pt alert and oriented X4, active, cooperative, pt in NAD. RR even and unlabored, color WNL.

## 2015-11-27 NOTE — ED Notes (Signed)
Pt alert and oriented X4, active, cooperative, pt in NAD. RR even and unlabored, color WNL.  Pt informed to return if any life threatening symptoms occur.   

## 2015-11-27 NOTE — ED Triage Notes (Signed)
Pt has pain in left great toe and left foot.  Pt struck wall this morning with her foot. Swelling noted to left foot.  Pt alert

## 2015-11-27 NOTE — ED Provider Notes (Signed)
[facility] Emergency Department Provider Note ____________________________________________  Time seen: Approximately 7:49 PM  I have reviewed the triage vital signs and the nursing notes.   HISTORY  Chief Complaint Foot Injury    HPI Marisa Sherman is a 55 y.o. female who presents to the emergency department for evaluation of left foot pain. She states that she struck her foot on the wall and has had pain and swelling since that time. Pain is in the joint at the base of the great toe. She currently started taking gabapentin for neuropathic pain and states that that has not helped.   Past Medical History:  Diagnosis Date  . Bipolar disorder (HCC)   . GERD (gastroesophageal reflux disease)   . Hypertension     Patient Active Problem List   Diagnosis Date Noted  . Mixed incontinence 02/24/2015  . Rectocele 02/24/2015  . Bipolar 1 disorder, depressed (HCC) 07/02/2013    Past Surgical History:  Procedure Laterality Date  . CARPAL TUNNEL RELEASE Bilateral   . CHOLECYSTECTOMY    . DILATION AND CURETTAGE OF UTERUS    . TONSILLECTOMY    . TUBAL LIGATION      Prior to Admission medications   Medication Sig Start Date End Date Taking? Authorizing Provider  CHANTIX 0.5 MG tablet  02/24/15   Historical Provider, MD  docusate sodium (COLACE) 100 MG capsule Take 1 capsule (100 mg total) by mouth 2 (two) times daily as needed. 02/24/15   Prentice DockerMartin A Defrancesco, MD  FLUoxetine (PROZAC) 40 MG capsule Take 1 capsule (40 mg total) by mouth every morning. 07/11/13   Beau FannyJohn C Withrow, FNP  gabapentin (NEURONTIN) 300 MG capsule Please take 1 tab at night 4 days, then take 1 tab at night, one tab in the morning 4 days then increase to 1 tab 3 times daily 11/11/15   Evon Slackhomas C Gaines, PA-C  HYDROcodone-acetaminophen (NORCO/VICODIN) 5-325 MG tablet Take 1 tablet by mouth every 4 (four) hours as needed for moderate pain. 11/27/15 11/26/16  Chinita Pesterari B Mathius Birkeland, FNP  hydrocortisone 2.5 %  cream  02/24/15   Historical Provider, MD  hydrOXYzine (VISTARIL) 25 MG capsule  02/24/15   Historical Provider, MD  ketoconazole (NIZORAL) 2 % cream  02/24/15   Historical Provider, MD  ketoconazole (NIZORAL) 2 % shampoo  02/24/15   Historical Provider, MD  lisinopril-hydrochlorothiazide (PRINZIDE,ZESTORETIC) 10-12.5 MG per tablet Take 1 tablet by mouth daily. 08/24/14   Myrna Blazeravid Matthew Schaevitz, MD  naproxen (NAPROSYN) 500 MG tablet Take 1 tablet (500 mg total) by mouth 2 (two) times daily with a meal. 11/27/15   Chinita Pesterari B Jace Fermin, FNP  omeprazole (PRILOSEC) 20 MG capsule  12/05/14   Historical Provider, MD  oxybutynin (DITROPAN) 5 MG tablet Take 1 tablet (5 mg total) by mouth 2 (two) times daily. 02/24/15   Prentice DockerMartin A Defrancesco, MD  REXULTI 2 MG TABS  01/13/15   Historical Provider, MD  traZODone (DESYREL) 100 MG tablet Take 1 tablet (100 mg total) by mouth at bedtime as needed for sleep (INSOMNIA). 07/11/13   Beau FannyJohn C Withrow, FNP    Allergies Codeine; Lithium; and Sulfa antibiotics  Family History  Problem Relation Age of Onset  . Cancer Neg Hx   . Diabetes Neg Hx   . Heart disease Neg Hx     Social History Social History  Substance Use Topics  . Smoking status: Current Every Day Smoker    Packs/day: 1.50    Types: Cigarettes  . Smokeless tobacco: Never Used  .  Alcohol use No    Review of Systems Constitutional: No recent illness. Cardiovascular: Denies chest pain or palpitations. Respiratory: Denies shortness of breath. Musculoskeletal: Pain in Left great toe Skin: Negative for rash, wound, lesion. Neurological: Negative for focal weakness or numbness.  ____________________________________________   PHYSICAL EXAM:  VITAL SIGNS: ED Triage Vitals  Enc Vitals Group     BP 11/27/15 1817 132/83     Pulse Rate 11/27/15 1817 78     Resp 11/27/15 1817 20     Temp 11/27/15 1817 98.3 F (36.8 C)     Temp Source 11/27/15 1817 Oral     SpO2 11/27/15 1817 99 %     Weight 11/27/15  1818 195 lb (88.5 kg)     Height 11/27/15 1818 5\' 6"  (1.676 m)     Head Circumference --      Peak Flow --      Pain Score 11/27/15 1819 7     Pain Loc --      Pain Edu? --      Excl. in GC? --     Constitutional: Alert and oriented. Well appearing and in no acute distress. Eyes: Conjunctivae are normal. EOMI. Head: Atraumatic. Neck: No stridor.  Respiratory: Normal respiratory effort.   Musculoskeletal: Limited range of motion of the left great toe with some mild swelling at the metatarsophalangeal joint. No deformity noted. Neurologic:  Normal speech and language. No gross focal neurologic deficits are appreciated. Speech is normal. No gait instability. Skin:  Skin is warm, dry and intact. Atraumatic. Psychiatric: Mood and affect are normal. Speech and behavior are normal.  ____________________________________________   LABS (all labs ordered are listed, but only abnormal results are displayed)  Labs Reviewed - No data to display ____________________________________________  RADIOLOGY  No acute bony abnormality of the left foot per radiology. ____________________________________________   PROCEDURES  Procedure(s) performed: Postop shoe applied to the left foot and crutches given with training to use only 1 crutch on the affected side for weight bearing assistance.    ____________________________________________   INITIAL IMPRESSION / ASSESSMENT AND PLAN / ED COURSE  Clinical Course    Pertinent labs & imaging results that were available during my care of the patient were reviewed by me and considered in my medical decision making (see chart for details).  Patient was advised to follow up with podiatry. She was advised to take her medications as prescribed and to return to the emergency department for symptoms that change or worsen if unable to see the primary care provider or the podiatrist. ____________________________________________   FINAL CLINICAL  IMPRESSION(S) / ED DIAGNOSES  Final diagnoses:  Contusion of left foot, initial encounter       Chinita PesterCari B Haaris Metallo, FNP 11/27/15 2002    Sharyn CreamerMark Quale, MD 11/28/15 [id number]

## 2015-12-01 DIAGNOSIS — F411 Generalized anxiety disorder: Secondary | ICD-10-CM | POA: Diagnosis not present

## 2015-12-08 DIAGNOSIS — F411 Generalized anxiety disorder: Secondary | ICD-10-CM | POA: Diagnosis not present

## 2015-12-11 DIAGNOSIS — M2011 Hallux valgus (acquired), right foot: Secondary | ICD-10-CM | POA: Diagnosis not present

## 2015-12-11 DIAGNOSIS — G5791 Unspecified mononeuropathy of right lower limb: Secondary | ICD-10-CM | POA: Diagnosis not present

## 2015-12-11 DIAGNOSIS — G5792 Unspecified mononeuropathy of left lower limb: Secondary | ICD-10-CM | POA: Diagnosis not present

## 2015-12-22 DIAGNOSIS — F411 Generalized anxiety disorder: Secondary | ICD-10-CM | POA: Diagnosis not present

## 2016-01-04 DIAGNOSIS — Z720 Tobacco use: Secondary | ICD-10-CM | POA: Diagnosis not present

## 2016-01-04 DIAGNOSIS — M722 Plantar fascial fibromatosis: Secondary | ICD-10-CM | POA: Diagnosis not present

## 2016-01-04 DIAGNOSIS — G629 Polyneuropathy, unspecified: Secondary | ICD-10-CM | POA: Insufficient documentation

## 2016-01-04 DIAGNOSIS — G6289 Other specified polyneuropathies: Secondary | ICD-10-CM | POA: Diagnosis not present

## 2016-01-07 DIAGNOSIS — F17200 Nicotine dependence, unspecified, uncomplicated: Secondary | ICD-10-CM | POA: Diagnosis not present

## 2016-01-07 DIAGNOSIS — M792 Neuralgia and neuritis, unspecified: Secondary | ICD-10-CM | POA: Diagnosis not present

## 2016-01-12 DIAGNOSIS — F411 Generalized anxiety disorder: Secondary | ICD-10-CM | POA: Diagnosis not present

## 2016-02-09 DIAGNOSIS — F411 Generalized anxiety disorder: Secondary | ICD-10-CM | POA: Diagnosis not present

## 2016-02-15 DIAGNOSIS — F411 Generalized anxiety disorder: Secondary | ICD-10-CM | POA: Diagnosis not present

## 2016-02-16 DIAGNOSIS — F411 Generalized anxiety disorder: Secondary | ICD-10-CM | POA: Diagnosis not present

## 2016-02-17 DIAGNOSIS — G6289 Other specified polyneuropathies: Secondary | ICD-10-CM | POA: Diagnosis not present

## 2016-02-22 DIAGNOSIS — Z0000 Encounter for general adult medical examination without abnormal findings: Secondary | ICD-10-CM | POA: Diagnosis not present

## 2016-02-22 DIAGNOSIS — Z8742 Personal history of other diseases of the female genital tract: Secondary | ICD-10-CM | POA: Diagnosis not present

## 2016-02-22 DIAGNOSIS — Z113 Encounter for screening for infections with a predominantly sexual mode of transmission: Secondary | ICD-10-CM | POA: Diagnosis not present

## 2016-02-22 DIAGNOSIS — F17200 Nicotine dependence, unspecified, uncomplicated: Secondary | ICD-10-CM | POA: Diagnosis not present

## 2016-02-22 DIAGNOSIS — R875 Abnormal microbiological findings in specimens from female genital organs: Secondary | ICD-10-CM | POA: Diagnosis not present

## 2016-02-22 DIAGNOSIS — R32 Unspecified urinary incontinence: Secondary | ICD-10-CM | POA: Diagnosis not present

## 2016-02-22 LAB — HM PAP SMEAR: HM Pap smear: NEGATIVE

## 2016-03-08 DIAGNOSIS — F411 Generalized anxiety disorder: Secondary | ICD-10-CM | POA: Diagnosis not present

## 2016-03-15 DIAGNOSIS — F411 Generalized anxiety disorder: Secondary | ICD-10-CM | POA: Diagnosis not present

## 2016-04-08 DIAGNOSIS — R32 Unspecified urinary incontinence: Secondary | ICD-10-CM | POA: Diagnosis not present

## 2016-04-08 DIAGNOSIS — J019 Acute sinusitis, unspecified: Secondary | ICD-10-CM | POA: Diagnosis not present

## 2016-04-12 DIAGNOSIS — F411 Generalized anxiety disorder: Secondary | ICD-10-CM | POA: Diagnosis not present

## 2016-04-19 DIAGNOSIS — F411 Generalized anxiety disorder: Secondary | ICD-10-CM | POA: Diagnosis not present

## 2016-05-03 DIAGNOSIS — F411 Generalized anxiety disorder: Secondary | ICD-10-CM | POA: Diagnosis not present

## 2016-05-05 DIAGNOSIS — R32 Unspecified urinary incontinence: Secondary | ICD-10-CM | POA: Diagnosis not present

## 2016-05-05 DIAGNOSIS — N393 Stress incontinence (female) (male): Secondary | ICD-10-CM | POA: Diagnosis not present

## 2016-05-17 DIAGNOSIS — F411 Generalized anxiety disorder: Secondary | ICD-10-CM | POA: Diagnosis not present

## 2016-07-19 DIAGNOSIS — F411 Generalized anxiety disorder: Secondary | ICD-10-CM | POA: Diagnosis not present

## 2016-08-09 DIAGNOSIS — F411 Generalized anxiety disorder: Secondary | ICD-10-CM | POA: Diagnosis not present

## 2016-08-16 DIAGNOSIS — F411 Generalized anxiety disorder: Secondary | ICD-10-CM | POA: Diagnosis not present

## 2016-08-23 DIAGNOSIS — F411 Generalized anxiety disorder: Secondary | ICD-10-CM | POA: Diagnosis not present

## 2016-09-13 DIAGNOSIS — F411 Generalized anxiety disorder: Secondary | ICD-10-CM | POA: Diagnosis not present

## 2016-09-20 DIAGNOSIS — F411 Generalized anxiety disorder: Secondary | ICD-10-CM | POA: Diagnosis not present

## 2016-10-04 DIAGNOSIS — F411 Generalized anxiety disorder: Secondary | ICD-10-CM | POA: Diagnosis not present

## 2016-11-11 DIAGNOSIS — F332 Major depressive disorder, recurrent severe without psychotic features: Secondary | ICD-10-CM | POA: Diagnosis not present

## 2016-11-15 DIAGNOSIS — F411 Generalized anxiety disorder: Secondary | ICD-10-CM | POA: Diagnosis not present

## 2016-11-22 DIAGNOSIS — F411 Generalized anxiety disorder: Secondary | ICD-10-CM | POA: Diagnosis not present

## 2016-12-29 ENCOUNTER — Ambulatory Visit: Admit: 2016-12-29 | Discharge: 2016-12-29 | Payer: MEDICARE | Attending: Family Medicine

## 2016-12-29 DIAGNOSIS — Z6830 Body mass index (BMI) 30.0-30.9, adult: Secondary | ICD-10-CM | POA: Diagnosis not present

## 2016-12-29 DIAGNOSIS — F319 Bipolar disorder, unspecified: Secondary | ICD-10-CM | POA: Diagnosis not present

## 2016-12-29 DIAGNOSIS — M545 Low back pain, unspecified: Secondary | ICD-10-CM

## 2016-12-29 DIAGNOSIS — I10 Essential (primary) hypertension: Secondary | ICD-10-CM | POA: Diagnosis not present

## 2016-12-29 DIAGNOSIS — Z23 Encounter for immunization: Secondary | ICD-10-CM | POA: Diagnosis not present

## 2016-12-29 MED ORDER — MELOXICAM 15 MG TAB
15 mg | ORAL_TABLET | Freq: Every day | ORAL | 0 refills | Status: AC
Start: 2016-12-29 — End: ?

## 2016-12-29 MED ORDER — LISINOPRIL-HYDROCHLOROTHIAZIDE 10 MG-12.5 MG TAB
ORAL_TABLET | Freq: Every day | ORAL | 3 refills | Status: AC
Start: 2016-12-29 — End: ?

## 2016-12-29 MED ORDER — KETOROLAC TROMETHAMINE 60 MG/2 ML IM
60 mg/2 mL | Freq: Once | INTRAMUSCULAR | 0 refills | Status: AC
Start: 2016-12-29 — End: 2016-12-29

## 2016-12-29 MED ORDER — CYCLOBENZAPRINE 5 MG TAB
5 mg | ORAL_TABLET | Freq: Every evening | ORAL | 0 refills | Status: AC | PRN
Start: 2016-12-29 — End: ?

## 2016-12-29 NOTE — Progress Notes (Signed)
1. Have you been to the ER, urgent care clinic, or been hospitalized since your last visit? New patient    2. Have you seen or consulted any other health care providers outside of the [facility] since your last visit? New patient  Reviewed record in preparation for visit and have necessary documentation  opportunity was given for questions  Goals that were addressed and/or need to be completed during or after this appointment include     Health Maintenance Due   Topic Date Due   ??? Pneumococcal 19-64 Medium Risk (1 of 1 - PPSV23) 06/18/1977   ??? DTaP/Tdap/Td series (1 - Tdap) 06/19/1979   ??? Shingrix Vaccine Age 24> (1 of 2) 06/18/2008   ??? BREAST CANCER SCRN MAMMOGRAM  06/18/2008   ??? FOBT Q 1 YEAR AGE 56-75  06/18/2008   ??? PAP AKA CERVICAL CYTOLOGY  12/30/2011   ??? Influenza Age 56 to Adult  08/24/2016   ??? MEDICARE YEARLY EXAM  12/29/2016

## 2016-12-29 NOTE — Patient Instructions (Addendum)
Acute Low Back Pain: Exercises  Your Care Instructions  Here are some examples of typical rehabilitation exercises for your condition. Start each exercise slowly. Ease off the exercise if you start to have pain.  Your doctor or physical therapist will tell you when you can start these exercises and which ones will work best for you.  When you are not being active, find a comfortable position for rest. Some people are comfortable on the floor or a medium-firm bed with a small pillow under their head and another under their knees. Some people prefer to lie on their side with a pillow between their knees. Don't stay in one position for too long.  Take short walks (10 to 20 minutes) every 2 to 3 hours. Avoid slopes, hills, and stairs until you feel better. Walk only distances you can manage without pain, especially leg pain.  How to do the exercises  Back stretches    1. Get down on your hands and knees on the floor.  2. Relax your head and allow it to droop. Round your back up toward the ceiling until you feel a nice stretch in your upper, middle, and lower back. Hold this stretch for as long as it feels comfortable, or about 15 to 30 seconds.  3. Return to the starting position with a flat back while you are on your hands and knees.  4. Let your back sway by pressing your stomach toward the floor. Lift your buttocks toward the ceiling.  5. Hold this position for 15 to 30 seconds.  6. Repeat 2 to 4 times.    Follow-up care is a key part of your treatment and safety. Be sure to make and go to all appointments, and call your doctor if you are having problems. It's also a good idea to know your test results and keep a list of the medicines you take.  Where can you learn more?  Go to InsuranceStats.ca.  Enter [phone] in the search box to learn more about "Acute Low Back Pain: Exercises."  Current as of: December 23, 2015  Content Version: 11.8   ?? 2006-2018 Healthwise[employer]. Care instructions adapted under license by [employer] (which disclaims liability or warranty for this information). If you have questions about a medical condition or this instruction, always ask your healthcare professional. [employer], Incorporated disclaims any warranty or liability for your use of this information.         Influenza (Flu) Vaccine (Inactivated or Recombinant): What You Need to Know  Why get vaccinated?  Influenza ("flu") is a contagious disease that spreads around the [place] every winter, usually between October and May.  Flu is caused by influenza viruses and is spread mainly by coughing, sneezing, and close contact.  Anyone can get flu. Flu strikes suddenly and can last several days. Symptoms vary by age, but can include:  ?? Fever/chills.  ?? Sore throat.  ?? Muscle aches.  ?? Fatigue.  ?? Cough.  ?? Headache.  ?? Runny or stuffy nose.  Flu can also lead to pneumonia and blood infections, and cause diarrhea and seizures in children. If you have a medical condition, such as heart or lung disease, flu can make it worse.  Flu is more dangerous for some people. Infants and young children, people 46 years of age and older, pregnant women, and people with certain health conditions or a weakened immune system are at greatest risk.  Each year thousands of people in the [place] die  from flu, and many more are hospitalized.  Flu vaccine can:  ?? Keep you from getting flu.  ?? Make flu less severe if you do get it.  ?? Keep you from spreading flu to your family and other people.  Inactivated and recombinant flu vaccines  A dose of flu vaccine is recommended every flu season. Children 6 months through 688 years of age may need two doses during the same flu season. Everyone else needs only one dose each flu season.  Some inactivated flu vaccines contain a very small amount of a mercury-based preservative called thimerosal. Studies have not shown  thimerosal in vaccines to be harmful, but flu vaccines that do not contain thimerosal are available.  There is no live flu virus in flu shots. They cannot cause the flu.  There are many flu viruses, and they are always changing. Each year a new flu vaccine is made to protect against three or four viruses that are likely to cause disease in the upcoming flu season. But even when the vaccine doesn't exactly match these viruses, it may still provide some protection.  Flu vaccine cannot prevent:  ?? Flu that is caused by a virus not covered by the vaccine.  ?? Illnesses that look like flu but are not.  Some people should not get this vaccine  Tell the person who is giving you the vaccine:  ?? If you have any severe (life-threatening) allergies. If you ever had a life-threatening allergic reaction after a dose of flu vaccine, or have a severe allergy to any part of this vaccine, you may be advised not to get vaccinated. Most, but not all, types of flu vaccine contain a small amount of egg protein.  ?? If you ever had Guillain-Barr?? syndrome (also called GBS) Some people with a history of GBS should not get this vaccine. This should be discussed with your doctor.  ?? If you are not feeling well. It is usually okay to get flu vaccine when you have a mild illness, but you might be asked to come back when you feel better.  Risks of a vaccine reaction  With any medicine, including vaccines, there is a chance of reactions. These are usually mild and go away on their own, but serious reactions are also possible.  Most people who get a flu shot do not have any problems with it.  Minor problems following a flu shot include:  ?? Soreness, redness, or swelling where the shot was given  ?? Hoarseness  ?? Sore, red or itchy eyes  ?? Cough  ?? Fever  ?? Aches  ?? Headache  ?? Itching  ?? Fatigue  If these problems occur, they usually begin soon after the shot and last 1 or 2 days.   More serious problems following a flu shot can include the following:  ?? There may be a small increased risk of Guillain-Barr?? Syndrome (GBS) after inactivated flu vaccine. This risk has been estimated at 1 or 2 additional cases per million people vaccinated. This is much lower than the risk of severe complications from flu, which can be prevented by flu vaccine.  ?? Young children who get the flu shot along with pneumococcal vaccine (PCV13) and/or DTaP vaccine at the same time might be slightly more likely to have a seizure caused by fever. Ask your doctor for more information. Tell your doctor if a child who is getting flu vaccine has ever had a seizure  Problems that could happen after any injected vaccine:  ??  People sometimes faint after a medical procedure, including vaccination. Sitting or lying down for about 15 minutes can help prevent fainting, and injuries caused by a fall. Tell your doctor if you feel dizzy, or have vision changes or ringing in the ears.  ?? Some people get severe pain in the shoulder and have difficulty moving the arm where a shot was given. This happens very rarely.  ?? Any medication can cause a severe allergic reaction. Such reactions from a vaccine are very rare, estimated at about 1 in a million doses, and would happen within a few minutes to a few hours after the vaccination.  As with any medicine, there is a very remote chance of a vaccine causing a serious injury or death.  The safety of vaccines is always being monitored. For more information, visit: http://floyd.org/www.cdc.gov/vaccinesafety/.  What if there is a serious reaction?  What should I look for?  ?? Look for anything that concerns you, such as signs of a severe allergic reaction, very high fever, or unusual behavior.  Signs of a severe allergic reaction can include hives, swelling of the face and throat, difficulty breathing, a fast heartbeat, dizziness, and weakness ??? usually within a few minutes to a few hours after the vaccination.   What should I do?  ?? If you think it is a severe allergic reaction or other emergency that can't wait, call 9-1-1 and get the person to the nearest hospital. Otherwise, call your doctor.  ?? Reactions should be reported to the "Vaccine Adverse Event Reporting System" (VAERS). Your doctor should file this report, or you can do it yourself through the VAERS website at www.vaers.LAgents.nohhs.gov, or by calling 1-7050708618.  VAERS does not give medical advice.  The National Vaccine Injury Compensation Program  The National Vaccine Injury Compensation Program (VICP) is a federal program that was created to compensate people who may have been injured by certain vaccines.  Persons who believe they may have been injured by a vaccine can learn about the program and about filing a claim by calling 1-(959)095-7567 or visiting the VICP website at SpiritualWord.atwww.hrsa.gov/vaccinecompensation. There is a time limit to file a claim for compensation.  How can I learn more?  ?? Ask your healthcare provider. He or she can give you the vaccine package insert or suggest other sources of information.  ?? Call your local or state health department.  ?? Contact the Centers for Disease Control and Prevention (CDC):  ? Call 445-027-86871-989-701-8449 (1-800-CDC-INFO) or  ? Visit CDC's website at BiotechRoom.com.cywww.cdc.gov/flu  Vaccine Information Statement  Inactivated Influenza Vaccine  08/30/2013)  42 [place].C. ?? [phone]-26  Department of Health and Insurance risk surveyorHuman Services  Centers for Disease Control and Prevention  Many Vaccine Information Statements are available in Spanish and other languages. See PromoAge.com.brwww.immunize.org/vis.  Muchas hojas de informaci??n sobre vacunas est??n disponibles en espa??ol y en otros idiomas. Visite PromoAge.com.brwww.immunize.org/vis.  Care instructions adapted under license by [employer] (which disclaims liability or warranty for this information). If you have questions about a medical condition or this instruction, always ask your  healthcare professional. Healthwise, Incorporated disclaims any warranty or liability for your use of this information.

## 2016-12-29 NOTE — Addendum Note (Signed)
Addended by: Jorje GuildMAKELA, Enedina Pair J on: 01/04/2017 01:09 PM     Modules accepted: Orders

## 2016-12-29 NOTE — Progress Notes (Signed)
Pamela Harrison  56 y.o. female  [birth year]  [address] [place] [address]  [id number]     [facility]: Progress Note       Encounter Date: 12/29/2016    Chief Complaint   Patient presents with   ??? New Patient   ??? Back Pain     helping mother off toilet   ??? Immunization/Injection     flu vaccine       History provided by patient  History of Present Illness   Pamela Harrison is a 56 y.o. female who presents to clinic today for:    Back Pain  Patient present with cc of back pain x 5 days. Patient was assisting her mother up from toilet (mother had broken both her ankle). Patient had been lifting an turning to move her mother. Sharp pain in the lower midback radiating to both sides. No pain down legs or issues with incontinence. Patient has taken tylenol for the pain.        Hypertension: Stable   BP Readings from Last 3 Encounters:   12/29/16 140/87   08/07/12 (!) 143/91   08/02/12 120/82     The patient reports:  taking medications as instructed, no medication side effects noted, no TIA's, no chest pain on exertion, no dyspnea on exertion, no swelling of ankles.     Home monitoring:No  Sodium   Date Value Ref Range Status   08/08/2012 140 134 - 144 mmol/L Final     Potassium   Date Value Ref Range Status   08/08/2012 4.6 3.5 - 5.2 mmol/L Final     Creatinine   Date Value Ref Range Status   08/08/2012 0.80 0.57 - 1.00 mg/dL Final   19/14/7829 0.7 0.6 - 1.3 MG/DL Final       Our goal is to normalize the blood pressure to decrease the risks of strokes and heart attacks. The patient is in agreement with the plan.      Bipolar disorder  Patient reports history of bipolar disorder. Reports that she had been followed by a psychiatrist while she lived in [place] but has yet to establish with one here. She is planning on contacting [facility].     Health Maintenance  To be addressed by medicare wellness.   Health Maintenance Due   Topic Date Due    ??? Pneumococcal 19-64 Medium Risk (1 of 1 - PPSV23) 06/18/1977   ??? DTaP/Tdap/Td series (1 - Tdap) 06/19/1979   ??? Shingrix Vaccine Age 74> (1 of 2) 06/18/2008   ??? BREAST CANCER SCRN MAMMOGRAM  06/18/2008   ??? FOBT Q 1 YEAR AGE 40-75  06/18/2008   ??? PAP AKA CERVICAL CYTOLOGY  12/30/2011   ??? MEDICARE YEARLY EXAM  12/29/2016     Review of Systems   Review of Systems   Constitutional: Negative for chills and fever.   HENT: Negative for congestion, ear discharge and ear pain.    Cardiovascular: Negative for chest pain and palpitations.   Gastrointestinal: Negative for abdominal pain, constipation, diarrhea, nausea and vomiting.   Genitourinary: Negative for dysuria, frequency and urgency.   Musculoskeletal: Positive for back pain. Negative for falls, joint pain, myalgias and neck pain.   Skin: Negative for itching and rash.   Neurological: Negative for dizziness, sensory change and headaches.        Vitals/Objective:     Vitals:    12/29/16 0826   BP: 140/87   Pulse: 88  Resp: 16   Temp: 98.4 ??F (36.9 ??C)   TempSrc: Oral   SpO2: 97%   Weight: 191 lb (86.6 kg)   Height: 5\' 6"  (1.676 m)     Body mass index is 30.83 kg/m??.    Wt Readings from Last 3 Encounters:   12/29/16 191 lb (86.6 kg)   08/07/12 198 lb 6.4 oz (90 kg)   08/02/12 201 lb 3.2 oz (91.3 kg)       Physical Exam   Constitutional: She is oriented to person, place, and time. She appears well-nourished.   Cardiovascular: Normal rate and regular rhythm.   Pulmonary/Chest: Effort normal and breath sounds normal.   Musculoskeletal: Normal range of motion.   Neurological: She is alert and oriented to person, place, and time.    MSK:    Posture: Normal   Deformity: None    ROM:     Flexion: Normal    Extension: Normal     Lateral bending: Normal      Gait: Normal       Palpation:    L1-L5: No tenderness    Sacrum: No tenderness    Coccyx: No tenderness    Left Paraspinal: + tenderness    Right Paraspinal: + tenderness     Strength (0-5/5)     Hip Flexion:   Left: 5/5  Right: 5/5    Hip Extension:  Left: 5/5  Right: 5/5    Hip Abduction:  Left: 5/5  Right: 5/5    Hip Adduction:  Left: 5/5  Right: 5/5    Knee Extension:  Left: 5/5  Right: 5/5    Knee Flexion:   Left: 5/5  Right: 5/5    Ankle dorsiflexion:  Left: 5/5  Right: 5/5    Ankle plantarflexion:  Left: 5/5  Right: 5/5    Great toe extension:  Left: 5/5  Right: 5/5     Sensation: Intact, no deficits      DTR:    Patella:  Left: +2  Right: +2    Achilles:  Left: +2  Right: +2     Special test:    Straight leg: Left: Negative  Right: Negative    Pearlean Brownie???s: Left: Negative  Right: Negative    Piriformis: Left: Negative  Right: Negative          No results found for this or any previous visit (from the past 24 hour(s)).  Assessment and Plan:     Encounter Diagnoses     ICD-10-CM ICD-9-CM   1. Acute bilateral low back pain without sciatica M54.5 724.2     338.19   2. Essential hypertension I10 401.9   3. Bipolar 1 disorder (HCC) F31.9 296.7   4. Encounter for immunization Z23 V03.89       1. Acute low back pain  - meloxicam (MOBIC) 15 mg tablet; Take 1 Tab by mouth daily.  Dispense: 30 Tab; Refill: 0  - cyclobenzaprine (FLEXERIL) 5 mg tablet; Take 1 Tab by mouth nightly as needed for Muscle Spasm(s).  Dispense: 30 Tab; Refill: 0  - ketorolac tromethamine (TORADOL) 60 mg/2 mL soln; 2 mL by IntraMUSCular route once for 1 dose.  Dispense: 1 Vial; Refill: 0  - KETOROLAC TROMETHAMINE INJ  - PR THER/PROPH/DIAG INJECTION, SUBCUT/IM    2. Essential hypertension  Well controlled.   - lisinopril-hydroCHLOROthiazide (PRINZIDE, ZESTORETIC) 10-12.5 mg per tablet; Take 1 Tab by mouth daily.  Dispense: 90 Tab; Refill: 3  - METABOLIC PANEL, BASIC  - LIPID  PANEL  - TSH 3RD GENERATION    3. Bipolar 1 disorder (HCC)  Patient has enough medication at this time. She will establish with psych.   - traZODone (DESYREL) 100 mg tablet; Take 2 Tabs by mouth nightly.   - hydrOXYzine HCl (ATARAX) 25 mg tablet; Take 1 Tab by mouth four (4) times daily as needed for Anxiety.    4. Encounter for immunization  - ADMIN INFLUENZA VIRUS VAC  - INFLUENZA VACCINE INACTIVATED (IIV), SUBUNIT, ADJUVANTED, IM      I have discussed the diagnosis with the patient and the intended plan as seen in the above orders.  she has expressed understanding.  The patient has received an after-visit summary and questions were answered concerning future plans.  I have discussed medication side effects and warnings with the patient as well.    Electronically Signed: Bonne DoloresKimberly A Kupono Marling, MD     History/Allergies   Patients past medical, surgical and family histories were reviewed and updated.    Past Medical History:   Diagnosis Date   ??? Anemia     by history   ??? Bipolar depression (HCC) 06/29/2008   ??? GERD (gastroesophageal reflux disease)    ??? Gout    ??? Hypertension    ??? Menopause       Past Surgical History:   Procedure Laterality Date   ??? HX CARPAL TUNNEL RELEASE  2000    bilateral   ??? HX CHOLECYSTECTOMY  1988   ??? HX TUBAL LIGATION  1995     Family History   Problem Relation Age of Onset   ??? Hypertension Father    ??? Hypertension Paternal Grandmother      Social History     Socioeconomic History   ??? Marital status: DIVORCED     Spouse name: Not on file   ??? Number of children: Not on file   ??? Years of education: Not on file   ??? Highest education level: Not on file   Social Needs   ??? Financial resource strain: Not on file   ??? Food insecurity - worry: Not on file   ??? Food insecurity - inability: Not on file   ??? Transportation needs - medical: Not on file   ??? Transportation needs - non-medical: Not on file   Occupational History   ??? Not on file   Tobacco Use   ??? Smoking status: Current Every Day Smoker   ??? Smokeless tobacco: Never Used   Substance and Sexual Activity   ??? Alcohol use: No   ??? Drug use: Not on file   ??? Sexual activity: Not on file   Other Topics Concern   ??? Not on file   Social History Narrative    ??? Not on file         Allergies   Allergen Reactions   ??? Benadryl [Diphenhydramine Hcl] Other (comments)     Pt unsure why   ??? Depakote [Divalproex] Diarrhea   ??? Lithium Itching   ??? Sulfa (Sulfonamide Antibiotics) Nausea and Vomiting       Disposition     Follow-up Disposition:  Return in about 2 weeks (around 01/12/2017) for Medicare Wellness. .    No future appointments.         Current Medications after this visit     Current Outpatient Medications   Medication Sig   ??? brexpiprazole (REXULTI) 2 mg tab tablet Take  by mouth daily.   ??? allopurinol (ZYLOPRIM) 100 mg tablet Take  by mouth daily.   ??? aspirin 81 mg chewable tablet Take 81 mg by mouth daily.   ??? atorvastatin (LIPITOR) 40 mg tablet Take  by mouth daily.   ??? pantoprazole (PROTONIX) 40 mg tablet Take 40 mg by mouth daily.   ??? traZODone (DESYREL) 100 mg tablet Take 2 Tabs by mouth nightly.   ??? lisinopril-hydroCHLOROthiazide (PRINZIDE, ZESTORETIC) 10-12.5 mg per tablet Take 1 Tab by mouth daily.   ??? hydrOXYzine HCl (ATARAX) 25 mg tablet Take 1 Tab by mouth four (4) times daily as needed for Anxiety.   ??? meloxicam (MOBIC) 15 mg tablet Take 1 Tab by mouth daily.   ??? cyclobenzaprine (FLEXERIL) 5 mg tablet Take 1 Tab by mouth nightly as needed for Muscle Spasm(s).   ??? ketorolac tromethamine (TORADOL) 60 mg/2 mL soln 2 mL by IntraMUSCular route once for 1 dose.   ??? FLUoxetine (PROZAC) 40 mg capsule Take 40 mg by mouth daily.     No current facility-administered medications for this visit.      Medications Discontinued During This Encounter   Medication Reason   ??? lithium carbonate 300 mg capsule Not A Current Medication   ??? OLANZapine (ZYPREXA) 20 mg tablet Not A Current Medication   ??? ranitidine (ZANTAC) 150 mg tablet Not A Current Medication   ??? docusate sodium (COLACE) 100 mg capsule Not A Current Medication   ??? OLANZapine (ZYPREXA) 15 mg tablet Not A Current Medication   ??? carbamazepine (EPITOL) 200 mg tablet Not A Current Medication    ??? trazodone (DESYREL) 150 mg tablet    ??? lisinopril-hydrochlorothiazide (PRINZIDE, ZESTORETIC) 10-12.5 mg per tablet Reorder   ??? hydrOXYzine HCl (ATARAX) 25 mg tablet

## 2016-12-30 LAB — METABOLIC PANEL, BASIC
BUN/Creatinine ratio: 13 (ref 9–23)
BUN: 10 mg/dL (ref 6–24)
CO2: 26 mmol/L (ref 20–29)
Calcium: 9.3 mg/dL (ref 8.7–10.2)
Chloride: 102 mmol/L (ref 96–106)
Creatinine: 0.75 mg/dL (ref 0.57–1.00)
GFR est AA: 102 mL/min/{1.73_m2} (ref >59)
GFR est non-AA: 88 mL/min/{1.73_m2} (ref >59)
Glucose: 87 mg/dL (ref 65–99)
Potassium: 4.1 mmol/L (ref 3.5–5.2)
Sodium: 141 mmol/L (ref 134–144)

## 2016-12-30 LAB — LIPID PANEL
Cholesterol, total: 163 mg/dL (ref 100–199)
HDL Cholesterol: 29 mg/dL — ABNORMAL LOW (ref >39)
LDL, calculated: 88 mg/dL (ref 0–99)
Triglyceride: 231 mg/dL — ABNORMAL HIGH (ref 0–149)
VLDL, calculated: 46 mg/dL — ABNORMAL HIGH (ref 5–40)

## 2016-12-30 LAB — TSH 3RD GENERATION: TSH: 1.79 u[IU]/mL (ref 0.450–4.500)

## 2017-02-07 DIAGNOSIS — F332 Major depressive disorder, recurrent severe without psychotic features: Secondary | ICD-10-CM | POA: Diagnosis not present

## 2017-02-13 ENCOUNTER — Ambulatory Visit (INDEPENDENT_AMBULATORY_CARE_PROVIDER_SITE_OTHER): Payer: Medicare Other | Admitting: Family Medicine

## 2017-02-13 ENCOUNTER — Other Ambulatory Visit: Payer: Self-pay

## 2017-02-13 ENCOUNTER — Encounter: Payer: Self-pay | Admitting: Family Medicine

## 2017-02-13 VITALS — BP 124/98 | HR 75 | Temp 97.7°F | Resp 16 | Ht 66.0 in | Wt 190.0 lb

## 2017-02-13 DIAGNOSIS — M1A9XX0 Chronic gout, unspecified, without tophus (tophi): Secondary | ICD-10-CM

## 2017-02-13 DIAGNOSIS — E785 Hyperlipidemia, unspecified: Secondary | ICD-10-CM | POA: Diagnosis not present

## 2017-02-13 DIAGNOSIS — I10 Essential (primary) hypertension: Secondary | ICD-10-CM

## 2017-02-13 DIAGNOSIS — N3946 Mixed incontinence: Secondary | ICD-10-CM | POA: Diagnosis not present

## 2017-02-13 DIAGNOSIS — G4700 Insomnia, unspecified: Secondary | ICD-10-CM

## 2017-02-13 DIAGNOSIS — K219 Gastro-esophageal reflux disease without esophagitis: Secondary | ICD-10-CM | POA: Insufficient documentation

## 2017-02-13 DIAGNOSIS — M109 Gout, unspecified: Secondary | ICD-10-CM | POA: Insufficient documentation

## 2017-02-13 DIAGNOSIS — F319 Bipolar disorder, unspecified: Secondary | ICD-10-CM

## 2017-02-13 DIAGNOSIS — Z1231 Encounter for screening mammogram for malignant neoplasm of breast: Secondary | ICD-10-CM

## 2017-02-13 DIAGNOSIS — Z1211 Encounter for screening for malignant neoplasm of colon: Secondary | ICD-10-CM | POA: Diagnosis not present

## 2017-02-13 DIAGNOSIS — F419 Anxiety disorder, unspecified: Secondary | ICD-10-CM

## 2017-02-13 DIAGNOSIS — Z720 Tobacco use: Secondary | ICD-10-CM | POA: Diagnosis not present

## 2017-02-13 DIAGNOSIS — Z1239 Encounter for other screening for malignant neoplasm of breast: Secondary | ICD-10-CM

## 2017-02-13 MED ORDER — NICOTINE 21 MG/24HR TD PT24: 21.0000 mg | MEDICATED_PATCH | Freq: Every day | TRANSDERMAL | 3 refills | Status: DC

## 2017-02-13 MED ORDER — ALLOPURINOL 100 MG PO TABS: 100.0000 mg | ORAL_TABLET | Freq: Every day | ORAL | 5 refills | Status: DC

## 2017-02-13 MED ORDER — PANTOPRAZOLE SODIUM 40 MG PO TBEC: 40.0000 mg | DELAYED_RELEASE_TABLET | Freq: Every day | ORAL | 5 refills | Status: DC

## 2017-02-13 MED ORDER — OXYBUTYNIN CHLORIDE 5 MG PO TABS: 5.0000 mg | ORAL_TABLET | Freq: Two times a day (BID) | ORAL | 5 refills | Status: DC

## 2017-02-13 MED ORDER — ATORVASTATIN CALCIUM 40 MG PO TABS: 40.0000 mg | ORAL_TABLET | Freq: Every day | ORAL | 5 refills | Status: DC

## 2017-02-13 MED ORDER — LISINOPRIL 20 MG PO TABS: 20.0000 mg | ORAL_TABLET | Freq: Every day | ORAL | 5 refills | Status: DC

## 2017-02-13 MED ORDER — DOCUSATE SODIUM 100 MG PO CAPS: 100.0000 mg | ORAL_CAPSULE | Freq: Two times a day (BID) | ORAL | 11 refills | Status: DC | PRN

## 2017-02-13 NOTE — Assessment & Plan Note (Signed)
Patient knows the consequences of long-term smoking and desires to quit She is tried Chantix and reports that it did not work in the past Given all of her psychiatric medications, would like her to discuss the possibility of Wellbutrin with her psychiatrist before just prescribed Discussed patches and gum with the patient She decides to try patches, so we will start 21 mg patch daily and follow-up at physical

## 2017-02-13 NOTE — Assessment & Plan Note (Signed)
Followed by psychiatry Continue current medications Advised patient to discuss any changes in medications or titrations that she wishes with her psychiatrist

## 2017-02-13 NOTE — Assessment & Plan Note (Signed)
Previously well controlled on oxybutynin We will resume this medication at previous dose Can consider higher dose or another medication in the future if this does not control her symptoms well

## 2017-02-13 NOTE — Assessment & Plan Note (Addendum)
Well-controlled per report Continue Protonix at this time Discussed with patient the possible consequences from long-term PPI use, including renal disease, low magnesium levels, osteoporosis She wishes to discuss further with GI when she sees them for consultation for colonoscopy

## 2017-02-13 NOTE — Assessment & Plan Note (Signed)
Managed by psychiatry Continue current medications 

## 2017-02-13 NOTE — Progress Notes (Signed)
Patient: Marisa Sherman, Female    DOB: 1960/03/26, 57 y.o.   MRN: [id number] Visit Date: 02/13/2017  Today's Provider: Shirlee Latch, MD   I, Joslyn Hy, CMA, am acting as scribe for Shirlee Latch, MD.  Chief Complaint  Patient presents with  . Establish Care   Subjective:    Establish Care Marisa Sherman is a 57 y.o. female who presents today for health maintenance and to establish care. Her previous PCP was Dr. Sallee Lange at [facility]. She feels fairly well. She is c/o fatigue. She reports exercising none. She reports she is sleeping poorly. Trazodone is ineffective. Insomnia is F/O psych.  Her PMH includes HTN, HLD, GERD, gout, anxiety, depression and insomnia. Pt is F/B [facility] for anxiety and depression. She would like to discuss smoking cessation. She has tried patches in the past, with relief. She would like another prescription of these. "Cold [place]" is ineffective per pt.   Pt has never had a colonoscopy. No family H/O colon cancer. Her last mammogram was "several years ago". Last pap was more than 5 years ago. Pt has a H/O abnormal paps.  -----------------------------------------------------------------  Cataracts: s/p surgery with lens replacement.  Sees Dr Dione Booze in [place].  Overactive bladder: Previously saw urologist. She's not sure who. She was given a prescription for oxybutynin and took that for quite a while. It seemed to help her symptoms significantly. She has run out of this medication would like a refill today.  Insomnia, depression, anxiety, bipolar disorder: Followed by psychiatry, Dr Suzie Portela. Taking Trazodone for sleep, Hydroxyzine prn anxiety, prozac and Rexulti for depression and anxiety.  He thinks that her insomnia is now well controlled, but reports her depression, anxiety, bipolar disorder are well controlled.  GERD, constipation: chronic and ongoing problems. She is taking pantoprazole daily and Colace daily with  good relief of symptoms. Pantoprazole colace  HTN: Taking Lisinopril-hctz for many years. Denies any side effects. Didn't take medicine yet today.    HLD: Taking Atorvastatin daily, as well as daily baby aspirin. Denies any side effects.  Gout: taking allopurinol daily. She has run out of this recently and needs a refill. She's had no flares since starting her allopurinol more than one year ago.   Review of Systems  Constitutional: Positive for fatigue. Negative for activity change, appetite change, chills, diaphoresis, fever and unexpected weight change.  HENT: Negative.   Eyes: Negative.   Respiratory: Negative.   Cardiovascular: Negative.   Gastrointestinal: Negative.   Endocrine: Negative.   Genitourinary: Negative.   Musculoskeletal: Negative.   Skin: Negative.   Allergic/Immunologic: Negative.   Neurological: Negative.   Hematological: Negative.   Psychiatric/Behavioral: Positive for sleep disturbance. Negative for agitation, behavioral problems, confusion, decreased concentration, dysphoric mood, hallucinations, self-injury and suicidal ideas. The patient is not nervous/anxious and is not hyperactive.     Social History      She  reports that she has been smoking cigarettes.  She has a 40.00 pack-year smoking history. she has never used smokeless tobacco. She reports that she does not drink alcohol or use drugs.       Social History   Socioeconomic History  . Marital status: Divorced    Spouse name: None  . Number of children: 3  . Years of education: None  . Highest education level: GED or equivalent  Social Needs  . Financial resource strain: None  . Food insecurity - worry: None  . Food insecurity - inability: None  .  Transportation needs - medical: None  . Transportation needs - non-medical: None  Occupational History  . Occupation: Disablilty  Tobacco Use  . Smoking status: Current Every Day Smoker    Packs/day: 1.00    Years: 40.00    Pack years: 40.00      Types: Cigarettes  . Smokeless tobacco: Never Used  . Tobacco comment: started smoking at age 54; has quit on and off throughout the years  Substance and Sexual Activity  . Alcohol use: No    Comment: 1-2 beers every 6 months  . Drug use: No  . Sexual activity: Not Currently  Other Topics Concern  . None  Social History Narrative  . None    Past Medical History:  Diagnosis Date  . Abnormal Pap smear of cervix   . Allergy   . Anxiety   . Bipolar disorder (HCC)   . Cataract   . Depression   . GERD (gastroesophageal reflux disease)   . Hyperlipidemia   . Hypertension   . Overactive bladder      Patient Active Problem List   Diagnosis Date Noted  . Anxiety 02/13/2017  . Tobacco abuse 02/13/2017  . Essential hypertension 02/13/2017  . Hyperlipidemia 02/13/2017  . Insomnia 02/13/2017  . Gout 02/13/2017  . GERD (gastroesophageal reflux disease) 02/13/2017  . Mixed incontinence 02/24/2015  . Rectocele 02/24/2015  . Bipolar 1 disorder, depressed (HCC) 07/02/2013    Past Surgical History:  Procedure Laterality Date  . CARPAL TUNNEL RELEASE Bilateral   . CERVIX LESION DESTRUCTION  1985  . CHOLECYSTECTOMY    . DILATION AND CURETTAGE OF UTERUS    . TONSILLECTOMY    . TUBAL LIGATION      Family History        Family Status  Relation Name Status  . Mother  Alive  . Father  Deceased  . Sister  Alive  . Brother  Alive  . Brother  Alive  . Neg Hx  (Not Specified)        Her family history includes Anxiety disorder in her brother, brother, mother, and sister; Asthma in her mother; Bipolar disorder in her father; Depression in her brother, brother, mother, and sister.     Allergies  Allergen Reactions  . Codeine Nausea Only  . Lithium Itching  . Sulfa Antibiotics Nausea And Vomiting     Current Outpatient Medications:  .  allopurinol (ZYLOPRIM) 100 MG tablet, Take 1 tablet (100 mg total) by mouth daily., Disp: 30 tablet, Rfl: 5 .  aspirin EC 81 MG tablet,  Take 81 mg by mouth daily., Disp: , Rfl:  .  atorvastatin (LIPITOR) 40 MG tablet, Take 1 tablet (40 mg total) by mouth daily at 6 PM., Disp: 30 tablet, Rfl: 5 .  docusate sodium (COLACE) 100 MG capsule, Take 1 capsule (100 mg total) by mouth 2 (two) times daily as needed., Disp: 60 capsule, Rfl: 11 .  FLUoxetine (PROZAC) 40 MG capsule, Take 1 capsule (40 mg total) by mouth every morning., Disp: 30 capsule, Rfl: 0 .  hydrOXYzine (VISTARIL) 25 MG capsule, Take 25 mg by mouth every 8 (eight) hours as needed. , Disp: , Rfl:  .  pantoprazole (PROTONIX) 40 MG tablet, Take 1 tablet (40 mg total) by mouth daily., Disp: 30 tablet, Rfl: 5 .  REXULTI 2 MG TABS, Take 2 mg by mouth at bedtime. , Disp: , Rfl:  .  traZODone (DESYREL) 100 MG tablet, Take 1 tablet (100 mg total) by mouth at  bedtime as needed for sleep (INSOMNIA)., Disp: 14 tablet, Rfl: 0 .  lisinopril (PRINIVIL,ZESTRIL) 20 MG tablet, Take 1 tablet (20 mg total) by mouth daily., Disp: 30 tablet, Rfl: 5 .  nicotine (NICODERM CQ - DOSED IN MG/24 HOURS) 21 mg/24hr patch, Place 1 patch (21 mg total) onto the skin daily., Disp: 28 patch, Rfl: 3 .  oxybutynin (DITROPAN) 5 MG tablet, Take 1 tablet (5 mg total) by mouth 2 (two) times daily., Disp: 60 tablet, Rfl: 5   Patient Care Team: Erasmo DownerBacigalupo, Tannar Broker M, MD as PCP - General (Family Medicine)      Objective:   Vitals: BP (!) 124/98 (BP Location: Left Arm, Patient Position: Sitting, Cuff Size: Large)   Pulse 75   Temp 97.7 F (36.5 C) (Oral)   Resp 16   Ht 5\' 6"  (1.676 m)   Wt 190 lb (86.2 kg)   SpO2 97%   BMI 30.67 kg/m    Vitals:   02/13/17 1359  BP: (!) 124/98  Pulse: 75  Resp: 16  Temp: 97.7 F (36.5 C)  TempSrc: Oral  SpO2: 97%  Weight: 190 lb (86.2 kg)  Height: 5\' 6"  (1.676 m)     Physical Exam  Constitutional: She is oriented to person, place, and time. She appears well-developed and well-nourished. No distress.  HENT:  Head: Normocephalic and atraumatic.  Right Ear:  External ear normal.  Left Ear: External ear normal.  Nose: Nose normal.  Mouth/Throat: Oropharynx is clear and moist.  Eyes: Conjunctivae and EOM are normal. Pupils are equal, round, and reactive to light. No scleral icterus.  Neck: Neck supple. No thyromegaly present.  Cardiovascular: Normal rate, regular rhythm, normal heart sounds and intact distal pulses.  No murmur heard. Pulmonary/Chest: Effort normal and breath sounds normal. No respiratory distress. She has no wheezes. She has no rales.  Abdominal: Soft. Bowel sounds are normal. She exhibits no distension. There is no tenderness. There is no rebound and no guarding.  Musculoskeletal: She exhibits no edema or deformity.  Lymphadenopathy:    She has no cervical adenopathy.  Neurological: She is alert and oriented to person, place, and time.  Skin: Skin is warm and dry. No rash noted.  Psychiatric: She has a normal mood and affect. Her behavior is normal.  Vitals reviewed.    Depression Screen PHQ 2/9 Scores 02/13/2017  PHQ - 2 Score 2  PHQ- 9 Score 10      Assessment & Plan:    Problem List Items Addressed This Visit      Cardiovascular and Mediastinum   Essential hypertension    Uncontrolled today Has not taken her medications yet today, however Given history of gout requiring allopurinol, patient should not be taking thiazide diuretic Discontinue HCTZ Increase lisinopril dose to 20 mg daily to compensate for discontinued HCTZ Would check labs, but patient reports she has recent labs available for my review and she'll bring these follow-up appointment      Relevant Medications   aspirin EC 81 MG tablet   atorvastatin (LIPITOR) 40 MG tablet   lisinopril (PRINIVIL,ZESTRIL) 20 MG tablet     Digestive   GERD (gastroesophageal reflux disease)    Well-controlled per report Continue Protonix at this time Discussed with patient the possible consequences from long-term PPI use, including renal disease, low magnesium  levels, osteoporosis She wishes to discuss further with GI when she sees them for consultation for colonoscopy      Relevant Medications   docusate sodium (COLACE) 100 MG  capsule   pantoprazole (PROTONIX) 40 MG tablet     Other   Bipolar 1 disorder, depressed (HCC)    Managed by psychiatry Continue current medications      Mixed incontinence - Primary    Previously well controlled on oxybutynin We will resume this medication at previous dose Can consider higher dose or another medication in the future if this does not control her symptoms well      Relevant Medications   oxybutynin (DITROPAN) 5 MG tablet   Anxiety    Managed by psychiatry Continue current medications      Tobacco abuse    Patient knows the consequences of long-term smoking and desires to quit She is tried Chantix and reports that it did not work in the past Given all of her psychiatric medications, would like her to discuss the possibility of Wellbutrin with her psychiatrist before just prescribed Discussed patches and gum with the patient She decides to try patches, so we will start 21 mg patch daily and follow-up at physical      Hyperlipidemia    We'll review labs and possibly repeat at next visit Continue Lipitor and baby aspirin at this time      Relevant Medications   aspirin EC 81 MG tablet   atorvastatin (LIPITOR) 40 MG tablet   lisinopril (PRINIVIL,ZESTRIL) 20 MG tablet   Insomnia    Followed by psychiatry Continue current medications Advised patient to discuss any changes in medications or titrations that she wishes with her psychiatrist      Gout    Reportedly well controlled Resume allopurinol Consider rechecking uric acid at next visit Discussed possibility of thiazide diuretics to increase risk of gout flares, so patient and I decided to discontinue HCTZ       Other Visit Diagnoses    Colon cancer screening       Relevant Orders   Ambulatory referral to Gastroenterology    Breast cancer screening       Relevant Orders   MM SCREENING BREAST TOMO BILATERAL     We'll request records from previous PCP No labs were drawn today, as patient declined because she will bring recent lab report to her visit in one month for my review  Any labs not available on this report, will be ordered at that time  Return in about 4 weeks (around 03/13/2017) for CPE.   The entirety of the information documented in the History of Present Illness, Review of Systems and Physical Exam were personally obtained by me. Portions of this information were initially documented by Irving Burton Ratchford, CMA and reviewed by me for thoroughness and accuracy.    Erasmo Downer, MD, MPH [facility] 02/13/2017 4:55 PM

## 2017-02-13 NOTE — Assessment & Plan Note (Addendum)
Uncontrolled today Has not taken her medications yet today, however Given history of gout requiring allopurinol, patient should not be taking thiazide diuretic Discontinue HCTZ Increase lisinopril dose to 20 mg daily to compensate for discontinued HCTZ Would check labs, but patient reports she has recent labs available for my review and she'll bring these follow-up appointment

## 2017-02-13 NOTE — Assessment & Plan Note (Signed)
We'll review labs and possibly repeat at next visit Continue Lipitor and baby aspirin at this time

## 2017-02-13 NOTE — Assessment & Plan Note (Signed)
Reportedly well controlled Resume allopurinol Consider rechecking uric acid at next visit Discussed possibility of thiazide diuretics to increase risk of gout flares, so patient and I decided to discontinue HCTZ

## 2017-02-13 NOTE — Assessment & Plan Note (Signed)
Managed by psychiatry Continue current medications

## 2017-02-21 DIAGNOSIS — F332 Major depressive disorder, recurrent severe without psychotic features: Secondary | ICD-10-CM | POA: Diagnosis not present

## 2017-02-23 ENCOUNTER — Telehealth: Payer: Self-pay | Admitting: Gastroenterology

## 2017-02-23 NOTE — Telephone Encounter (Signed)
Patient LVM to call and schedule a colonoscopy °

## 2017-03-01 ENCOUNTER — Telehealth: Payer: Self-pay

## 2017-03-01 NOTE — Telephone Encounter (Signed)
LVM for patient to contact office to schedule colonoscopy.

## 2017-03-07 ENCOUNTER — Other Ambulatory Visit: Payer: Self-pay

## 2017-03-09 ENCOUNTER — Ambulatory Visit (INDEPENDENT_AMBULATORY_CARE_PROVIDER_SITE_OTHER): Payer: Medicare Other | Admitting: Family Medicine

## 2017-03-09 ENCOUNTER — Encounter: Payer: Self-pay | Admitting: Family Medicine

## 2017-03-09 VITALS — BP 162/112 | HR 66 | Temp 98.1°F | Resp 16 | Wt 191.0 lb

## 2017-03-09 DIAGNOSIS — J069 Acute upper respiratory infection, unspecified: Secondary | ICD-10-CM

## 2017-03-09 DIAGNOSIS — H6502 Acute serous otitis media, left ear: Secondary | ICD-10-CM | POA: Diagnosis not present

## 2017-03-09 MED ORDER — AMOXICILLIN-POT CLAVULANATE 875-125 MG PO TABS: 1.0000 | ORAL_TABLET | Freq: Two times a day (BID) | ORAL | 0 refills | Status: DC

## 2017-03-09 MED ORDER — FLUTICASONE PROPIONATE 50 MCG/ACT NA SUSP: 2.0000 | Freq: Every day | NASAL | 6 refills | Status: DC

## 2017-03-09 NOTE — Progress Notes (Signed)
Patient: Marisa Sherman Female    DOB: 03-07-1960   57 y.o.   MRN: [id number] Visit Date: 03/09/2017  Today's Provider: Shirlee LatchAngela Shaniqua Guillot, MD   Chief Complaint  Patient presents with  . URI   Subjective:    URI   This is a new problem. Episode onset: x 2 weeks. The problem has been unchanged. There has been no fever. Associated symptoms include abdominal pain, congestion, coughing (productive, seems to be improving slowly), diarrhea, ear pain, headaches, nausea, rhinorrhea, sneezing and wheezing. Pertinent negatives include no chest pain, dysuria, neck pain, plugged ear sensation, sinus pain, sore throat, swollen glands or vomiting. Associated symptoms comments: Has noticed chills, sweats, decreased appetite, and "clamminess". Treatments tried: Mucinex DM, Cold and Flu Alka-Seltzer. The treatment provided no relief.       Allergies  Allergen Reactions  . Codeine Nausea Only  . Lithium Itching  . Sulfa Antibiotics Nausea And Vomiting     Current Outpatient Medications:  .  allopurinol (ZYLOPRIM) 100 MG tablet, Take 1 tablet (100 mg total) by mouth daily., Disp: 30 tablet, Rfl: 5 .  aspirin EC 81 MG tablet, Take 81 mg by mouth daily., Disp: , Rfl:  .  atorvastatin (LIPITOR) 40 MG tablet, Take 1 tablet (40 mg total) by mouth daily at 6 PM., Disp: 30 tablet, Rfl: 5 .  FLUoxetine (PROZAC) 40 MG capsule, Take 1 capsule (40 mg total) by mouth every morning., Disp: 30 capsule, Rfl: 0 .  hydrOXYzine (VISTARIL) 25 MG capsule, Take 25 mg by mouth every 8 (eight) hours as needed. , Disp: , Rfl:  .  lisinopril (PRINIVIL,ZESTRIL) 20 MG tablet, Take 1 tablet (20 mg total) by mouth daily., Disp: 30 tablet, Rfl: 5 .  nicotine (NICODERM CQ - DOSED IN MG/24 HOURS) 21 mg/24hr patch, Place 1 patch (21 mg total) onto the skin daily., Disp: 28 patch, Rfl: 3 .  oxybutynin (DITROPAN) 5 MG tablet, Take 1 tablet (5 mg total) by mouth 2 (two) times daily., Disp: 60 tablet, Rfl: 5 .  pantoprazole  (PROTONIX) 40 MG tablet, Take 1 tablet (40 mg total) by mouth daily., Disp: 30 tablet, Rfl: 5 .  REXULTI 2 MG TABS, Take 2 mg by mouth at bedtime. , Disp: , Rfl:  .  traZODone (DESYREL) 100 MG tablet, Take 1 tablet (100 mg total) by mouth at bedtime as needed for sleep (INSOMNIA)., Disp: 14 tablet, Rfl: 0  Review of Systems  HENT: Positive for congestion, ear pain, rhinorrhea and sneezing. Negative for sinus pain and sore throat.   Respiratory: Positive for cough (productive, seems to be improving slowly) and wheezing.   Cardiovascular: Negative for chest pain.  Gastrointestinal: Positive for abdominal pain, diarrhea and nausea. Negative for vomiting.  Genitourinary: Negative for dysuria.  Musculoskeletal: Negative for neck pain.  Neurological: Positive for headaches.    Social History   Tobacco Use  . Smoking status: Current Every Day Smoker    Packs/day: 0.50    Years: 40.00    Pack years: 20.00    Types: Cigarettes  . Smokeless tobacco: Never Used  . Tobacco comment: started smoking at age 57; has quit on and off throughout the years. Has decreased cig use to 0.5 PPD  Substance Use Topics  . Alcohol use: No    Comment: 1-2 beers every 6 months   Objective:   BP (!) 162/112 (BP Location: Right Arm, Patient Position: Sitting, Cuff Size: Large)   Pulse 66   Temp 98.1 F (  36.7 C) (Oral)   Resp 16   Wt 191 lb (86.6 kg)   SpO2 98%   BMI 30.83 kg/m  Vitals:   03/09/17 1543  BP: (!) 162/112  Pulse: 66  Resp: 16  Temp: 98.1 F (36.7 C)  TempSrc: Oral  SpO2: 98%  Weight: 191 lb (86.6 kg)     Physical Exam  Constitutional: She is oriented to person, place, and time. She appears well-developed and well-nourished. No distress.  HENT:  Head: Normocephalic and atraumatic.  Right Ear: Tympanic membrane, external ear and ear canal normal.  Left Ear: External ear and ear canal normal. Tympanic membrane is injected, erythematous and bulging.  Nose: Mucosal edema present.  Right sinus exhibits no maxillary sinus tenderness and no frontal sinus tenderness. Left sinus exhibits no maxillary sinus tenderness and no frontal sinus tenderness.  Mouth/Throat: Mucous membranes are normal. Posterior oropharyngeal erythema present. No oropharyngeal exudate.  Eyes: Conjunctivae are normal. Pupils are equal, round, and reactive to light. Right eye exhibits no discharge. Left eye exhibits no discharge. No scleral icterus.  Neck: Neck supple.  Cardiovascular: Normal rate, regular rhythm, normal heart sounds and intact distal pulses.  No murmur heard. Pulmonary/Chest: Effort normal and breath sounds normal. No respiratory distress. She has no wheezes. She has no rales.  Musculoskeletal: She exhibits no edema.  Lymphadenopathy:    She has no cervical adenopathy.  Neurological: She is alert and oriented to person, place, and time.  Skin: Skin is warm and dry. No rash noted.  Psychiatric: She has a normal mood and affect. Her behavior is normal.  Vitals reviewed.       Assessment & Plan:     1. Acute serous otitis media of left ear, recurrence not specified - suspect this could cause her fatigue and chills - will treat with 7d course of Augmentin - return precautions discussed  2. Viral URI - suspect initial illness was viral URI and now has post-viral cough and secondary bacterial AOM - discussed symptomatic treatment, return precautions    Meds ordered this encounter  Medications  . amoxicillin-clavulanate (AUGMENTIN) 875-125 MG tablet    Sig: Take 1 tablet by mouth 2 (two) times daily for 7 days.    Dispense:  14 tablet    Refill:  0  . fluticasone (FLONASE) 50 MCG/ACT nasal spray    Sig: Place 2 sprays into both nostrils daily.    Dispense:  16 g    Refill:  6     Return if symptoms worsen or fail to improve.   The entirety of the information documented in the History of Present Illness, Review of Systems and Physical Exam were personally obtained by  me. Portions of this information were initially documented by Irving Burton Ratchford, CMA and reviewed by me for thoroughness and accuracy.    Erasmo Downer, MD, MPH [facility] 03/09/2017 4:20 PM

## 2017-03-09 NOTE — Patient Instructions (Signed)

## 2017-03-10 ENCOUNTER — Telehealth: Payer: Self-pay | Admitting: Family Medicine

## 2017-03-10 MED ORDER — DOXYCYCLINE HYCLATE 100 MG PO TABS: 100.0000 mg | ORAL_TABLET | Freq: Two times a day (BID) | ORAL | 0 refills | Status: DC

## 2017-03-10 MED ORDER — ONDANSETRON HCL 4 MG PO TABS: 4.0000 mg | ORAL_TABLET | Freq: Three times a day (TID) | ORAL | 0 refills | Status: DC | PRN

## 2017-03-10 NOTE — Telephone Encounter (Signed)
That is a common side effect of most antibiotics.  We can try a different antibiotic, but taking it with food and taking probiotics/eating yogurt can hel some with the side effects.  If patient agreeable, ok to switch to Doxycyline 100mg  BID x7 days.  She should not take both at the same time.  Erasmo DownerBacigalupo, Cola Gane M, MD, MPH [facility] 03/10/2017 11:10 AM

## 2017-03-10 NOTE — Telephone Encounter (Signed)
Pt agreed to change to Doxycycline.  She also requested a prescription for phenergan for the nausea.  She has used two of her daughter's already and it helped with the symptoms.    Thanks,   -Vernona RiegerLaura

## 2017-03-10 NOTE — Telephone Encounter (Signed)
Please review

## 2017-03-10 NOTE — Telephone Encounter (Signed)
Pt advised.

## 2017-03-10 NOTE — Telephone Encounter (Signed)
Pt stated the amoxicillin-clavulanate (AUGMENTIN) 875-125 MG tablet that she started taking yesterday 03/09/17 is making her vomit and have diarrhea. Pt is requesting another antibiotic to be sent to [facility]. Please advise. Thanks TNP

## 2017-03-10 NOTE — Telephone Encounter (Signed)
Zofran (for nausea) and doxycycline sent to pharmacy  Bacigalupo, Marzella SchleinAngela M, MD, MPH [facility] 03/10/2017 3:16 PM

## 2017-03-14 DIAGNOSIS — F332 Major depressive disorder, recurrent severe without psychotic features: Secondary | ICD-10-CM | POA: Diagnosis not present

## 2017-03-15 ENCOUNTER — Telehealth: Payer: Self-pay | Admitting: Family Medicine

## 2017-03-20 NOTE — Telephone Encounter (Signed)
LMTCB and schedule AWV.  -MM 

## 2017-03-30 NOTE — Telephone Encounter (Signed)
Pt did NOT return call. No MWV available prior to physical tomorrow. -MM

## 2017-03-31 ENCOUNTER — Encounter: Payer: Medicare Other | Admitting: Family Medicine

## 2017-03-31 NOTE — Progress Notes (Deleted)
Patient: Marisa Sherman, Female    DOB: [birth year], 57 y.o.   MRN: [id number] Visit Date: 03/31/2017  Today's Provider: Shirlee Latch, MD   I, Joslyn Hy, CMA, am acting as scribe for Shirlee Latch, MD.  No chief complaint on file.  Subjective:    Annual wellness visit Marisa Sherman is a 57 y.o. female. She feels {DESC; WELL/FAIRLY WELL/POORLY:18703}. She reports exercising ***. She reports she is sleeping {DESC; WELL/FAIRLY WELL/POORLY:18703}.  Mammogram was ordered in January, but not done. Referral was colonoscopy was placed in January, but pt was unable to be contacted to schedule this.  -----------------------------------------------------------   Review of Systems  Social History   Socioeconomic History  . Marital status: Divorced    Spouse name: Not on file  . Number of children: 3  . Years of education: Not on file  . Highest education level: GED or equivalent  Social Needs  . Financial resource strain: Not on file  . Food insecurity - worry: Not on file  . Food insecurity - inability: Not on file  . Transportation needs - medical: Not on file  . Transportation needs - non-medical: Not on file  Occupational History  . Occupation: Disablilty  Tobacco Use  . Smoking status: Current Every Day Smoker    Packs/day: 0.50    Years: 40.00    Pack years: 20.00    Types: Cigarettes  . Smokeless tobacco: Never Used  . Tobacco comment: started smoking at age 39; has quit on and off throughout the years. Has decreased cig use to 0.5 PPD  Substance and Sexual Activity  . Alcohol use: No    Comment: 1-2 beers every 6 months  . Drug use: No  . Sexual activity: Not Currently  Other Topics Concern  . Not on file  Social History Narrative  . Not on file    Past Medical History:  Diagnosis Date  . Abnormal Pap smear of cervix   . Allergy   . Anxiety   . Bipolar disorder (HCC)   . Cataract   . Depression   . GERD (gastroesophageal reflux disease)    . Hyperlipidemia   . Hypertension   . Overactive bladder      Patient Active Problem List   Diagnosis Date Noted  . Anxiety 02/13/2017  . Tobacco abuse 02/13/2017  . Essential hypertension 02/13/2017  . Hyperlipidemia 02/13/2017  . Insomnia 02/13/2017  . Gout 02/13/2017  . GERD (gastroesophageal reflux disease) 02/13/2017  . Mixed incontinence 02/24/2015  . Rectocele 02/24/2015  . Bipolar 1 disorder, depressed (HCC) 07/02/2013    Past Surgical History:  Procedure Laterality Date  . CARPAL TUNNEL RELEASE Bilateral   . CERVIX LESION DESTRUCTION  1985  . CHOLECYSTECTOMY    . DILATION AND CURETTAGE OF UTERUS    . TONSILLECTOMY    . TUBAL LIGATION      Her family history includes Anxiety disorder in her brother, brother, mother, and sister; Asthma in her mother; Bipolar disorder in her father; Depression in her brother, brother, mother, and sister. There is no history of Cancer, Diabetes, Heart disease, Colon cancer, Breast cancer, Ovarian cancer, or Cervical cancer.      Current Outpatient Medications:  .  allopurinol (ZYLOPRIM) 100 MG tablet, Take 1 tablet (100 mg total) by mouth daily., Disp: 30 tablet, Rfl: 5 .  aspirin EC 81 MG tablet, Take 81 mg by mouth daily., Disp: , Rfl:  .  atorvastatin (LIPITOR) 40 MG tablet, Take 1 tablet (  40 mg total) by mouth daily at 6 PM., Disp: 30 tablet, Rfl: 5 .  doxycycline (VIBRA-TABS) 100 MG tablet, Take 1 tablet (100 mg total) by mouth 2 (two) times daily., Disp: 14 tablet, Rfl: 0 .  FLUoxetine (PROZAC) 40 MG capsule, Take 1 capsule (40 mg total) by mouth every morning., Disp: 30 capsule, Rfl: 0 .  fluticasone (FLONASE) 50 MCG/ACT nasal spray, Place 2 sprays into both nostrils daily., Disp: 16 g, Rfl: 6 .  hydrOXYzine (VISTARIL) 25 MG capsule, Take 25 mg by mouth every 8 (eight) hours as needed. , Disp: , Rfl:  .  lisinopril (PRINIVIL,ZESTRIL) 20 MG tablet, Take 1 tablet (20 mg total) by mouth daily., Disp: 30 tablet, Rfl: 5 .  nicotine  (NICODERM CQ - DOSED IN MG/24 HOURS) 21 mg/24hr patch, Place 1 patch (21 mg total) onto the skin daily., Disp: 28 patch, Rfl: 3 .  ondansetron (ZOFRAN) 4 MG tablet, Take 1 tablet (4 mg total) by mouth every 8 (eight) hours as needed for nausea or vomiting., Disp: 20 tablet, Rfl: 0 .  oxybutynin (DITROPAN) 5 MG tablet, Take 1 tablet (5 mg total) by mouth 2 (two) times daily., Disp: 60 tablet, Rfl: 5 .  pantoprazole (PROTONIX) 40 MG tablet, Take 1 tablet (40 mg total) by mouth daily., Disp: 30 tablet, Rfl: 5 .  REXULTI 2 MG TABS, Take 2 mg by mouth at bedtime. , Disp: , Rfl:  .  traZODone (DESYREL) 100 MG tablet, Take 1 tablet (100 mg total) by mouth at bedtime as needed for sleep (INSOMNIA)., Disp: 14 tablet, Rfl: 0  Patient Care Team: Erasmo DownerBacigalupo, Angela M, MD as PCP - General (Family Medicine)     Objective:   Vitals: There were no vitals taken for this visit.  Physical Exam  Activities of Daily Living In your present state of health, do you have any difficulty performing the following activities: 02/13/2017  Hearing? N  Vision? N  Difficulty concentrating or making decisions? N  Walking or climbing stairs? N  Dressing or bathing? N  Doing errands, shopping? N  Some recent data might be hidden    Fall Risk Assessment Fall Risk  02/13/2017  Falls in the past year? No     Depression Screen PHQ 2/9 Scores 02/13/2017  PHQ - 2 Score 2  PHQ- 9 Score 10    Cognitive Testing - 6-CIT  Correct? Score   What year is it? {yes no:22349} {0-4:31231} 0 or 4  What month is it? {yes no:22349} {0-3:21082} 0 or 3  Memorize:    Marisa Sherman,  Marisa Sherman,  40,  [address],  [place],      What time is it? (within 1 hour) {yes no:22349} {0-3:21082} 0 or 3  Count backwards from 20 {yes no:22349} {0-4:31231} 0, 2, or 4  Name the months of the year {yes no:22349} {0-4:31231} 0, 2, or 4  Repeat name & address above {yes no:22349} {0-10:5044} 0, 2, 4, 6, 8, or 10       TOTAL SCORE  ***/28   Interpretation:   {normal/abnormal:11317::"Normal"}  Normal (0-7) Abnormal (8-28)       Assessment & Plan:     Annual Wellness Visit  Reviewed patient's Family Medical History Reviewed and updated list of patient's medical providers Assessment of cognitive impairment was done Assessed patient's functional ability Established a written schedule for health screening services Health Risk Assessent Completed and Reviewed  Exercise Activities and Dietary recommendations Goals    None  Immunization History  Administered Date(s) Administered  . Influenza-Unspecified 01/30/2017  . Pneumococcal Polysaccharide-23 07/04/2013    Health Maintenance  Topic Date Due  . Hepatitis C Screening  [birth year]  . HIV Screening  06/18/1973  . TETANUS/TDAP  06/18/1977  . PAP SMEAR  06/19/1979  . MAMMOGRAM  06/18/2008  . COLONOSCOPY  06/18/2008  . INFLUENZA VACCINE  Completed     Discussed health benefits of physical activity, and encouraged her to engage in regular exercise appropriate for her age and condition.    ------------------------------------------------------------------------------------------------------------

## 2017-04-13 ENCOUNTER — Telehealth: Payer: Self-pay | Admitting: Gastroenterology

## 2017-04-13 NOTE — Telephone Encounter (Signed)
Patient LVM that she needs to schedule a colonoscopy

## 2017-04-18 DIAGNOSIS — F332 Major depressive disorder, recurrent severe without psychotic features: Secondary | ICD-10-CM | POA: Diagnosis not present

## 2017-04-20 NOTE — Telephone Encounter (Signed)
LVM for pt to contact office to schedule colonoscopy.  Thanks Athalene Kolle 

## 2017-04-24 ENCOUNTER — Ambulatory Visit: Payer: Medicare Other

## 2017-04-26 ENCOUNTER — Telehealth: Payer: Self-pay | Admitting: Gastroenterology

## 2017-04-26 ENCOUNTER — Encounter: Payer: Medicare Other | Admitting: Family Medicine

## 2017-04-26 NOTE — Telephone Encounter (Signed)
Patient ready to schedule colonoscopy. 

## 2017-05-01 ENCOUNTER — Telehealth: Payer: Self-pay

## 2017-05-01 ENCOUNTER — Ambulatory Visit (INDEPENDENT_AMBULATORY_CARE_PROVIDER_SITE_OTHER): Payer: Medicare Other

## 2017-05-01 VITALS — BP 126/84 | HR 68 | Temp 98.2°F | Ht 66.0 in | Wt 187.8 lb

## 2017-05-01 DIAGNOSIS — Z0000 Encounter for general adult medical examination without abnormal findings: Secondary | ICD-10-CM | POA: Diagnosis not present

## 2017-05-01 NOTE — Patient Instructions (Addendum)
[name] , Thank you for taking time to come for your Medicare Wellness Visit. I appreciate your ongoing commitment to your health goals. Please review the following plan we discussed and let me know if I can assist you in the future.   Screening recommendations/referrals: Colonoscopy: Pt awaiting a CB from GI office to schedule. Mammogram: Pt plans to set up apt this year. Order is on file. Bone Density: N/A Recommended yearly ophthalmology/optometry visit for glaucoma screening and checkup Recommended yearly dental visit for hygiene and checkup  Vaccinations: Influenza vaccine: Up to date Pneumococcal vaccine: N/A Tdap vaccine: Pt declines today.  Shingles vaccine: N/A    Advanced directives: Advance directive discussed with you today. Even though you declined this today please call our office should you change your mind and we can give you the proper paperwork for you to fill out.  Conditions/risks identified: Smoking cessation; Recommend increasing water intake to 4 glasses a day.   Next appointment: 07/05/17 @ 9 AM with  Dr. Brita Romp.  Preventive Care 40-64 Years, Female Preventive care refers to lifestyle choices and visits with your health care provider that can promote health and wellness. What does preventive care include?  A yearly physical exam. This is also called an annual well check.  Dental exams once or twice a year.  Routine eye exams. Ask your health care provider how often you should have your eyes checked.  Personal lifestyle choices, including:  Daily care of your teeth and gums.  Regular physical activity.  Eating a healthy diet.  Avoiding tobacco and drug use.  Limiting alcohol use.  Practicing safe sex.  Taking low-dose aspirin daily starting at age 70.  Taking vitamin and mineral supplements as recommended by your health care provider. What happens during an annual well check? The services and screenings done by your health care provider  during your annual well check will depend on your age, overall health, lifestyle risk factors, and family history of disease. Counseling  Your health care provider may ask you questions about your:  Alcohol use.  Tobacco use.  Drug use.  Emotional well-being.  Home and relationship well-being.  Sexual activity.  Eating habits.  Work and work Statistician.  Method of birth control.  Menstrual cycle.  Pregnancy history. Screening  You may have the following tests or measurements:  Height, weight, and BMI.  Blood pressure.  Lipid and cholesterol levels. These may be checked every 5 years, or more frequently if you are over 81 years old.  Skin check.  Lung cancer screening. You may have this screening every year starting at age 1 if you have a 30-pack-year history of smoking and currently smoke or have quit within the past 15 years.  Fecal occult blood test (FOBT) of the stool. You may have this test every year starting at age 11.  Flexible sigmoidoscopy or colonoscopy. You may have a sigmoidoscopy every 5 years or a colonoscopy every 10 years starting at age 74.  Hepatitis C blood test.  Hepatitis B blood test.  Sexually transmitted disease (STD) testing.  Diabetes screening. This is done by checking your blood sugar (glucose) after you have not eaten for a while (fasting). You may have this done every 1-3 years.  Mammogram. This may be done every 1-2 years. Talk to your health care provider about when you should start having regular mammograms. This may depend on whether you have a family history of breast cancer.  BRCA-related cancer screening. This may be done if you  have a family history of breast, ovarian, tubal, or peritoneal cancers.  Pelvic exam and Pap test. This may be done every 3 years starting at age 76. Starting at age 42, this may be done every 5 years if you have a Pap test in combination with an HPV test.  Bone density scan. This is done to screen  for osteoporosis. You may have this scan if you are at high risk for osteoporosis. Discuss your test results, treatment options, and if necessary, the need for more tests with your health care provider. Vaccines  Your health care provider may recommend certain vaccines, such as:  Influenza vaccine. This is recommended every year.  Tetanus, diphtheria, and acellular pertussis (Tdap, Td) vaccine. You may need a Td booster every 10 years.  Zoster vaccine. You may need this after age 42.  Pneumococcal 13-valent conjugate (PCV13) vaccine. You may need this if you have certain conditions and were not previously vaccinated.  Pneumococcal polysaccharide (PPSV23) vaccine. You may need one or two doses if you smoke cigarettes or if you have certain conditions. Talk to your health care provider about which screenings and vaccines you need and how often you need them. This information is not intended to replace advice given to you by your health care provider. Make sure you discuss any questions you have with your health care provider. Document Released: 02/06/2015 Document Revised: 09/30/2015 Document Reviewed: 11/11/2014 Elsevier Interactive Patient Education  2017 [place] Prevention in the Home Falls can cause injuries. They can happen to people of all ages. There are many things you can do to make your home safe and to help prevent falls. What can I do on the outside of my home?  Regularly fix the edges of walkways and driveways and fix any cracks.  Remove anything that might make you trip as you walk through a door, such as a raised step or threshold.  Trim any bushes or trees on the path to your home.  Use bright outdoor lighting.  Clear any walking paths of anything that might make someone trip, such as rocks or tools.  Regularly check to see if handrails are loose or broken. Make sure that both sides of any steps have handrails.  Any raised decks and porches should  have guardrails on the edges.  Have any leaves, snow, or ice cleared regularly.  Use sand or salt on walking paths during winter.  Clean up any spills in your garage right away. This includes oil or grease spills. What can I do in the bathroom?  Use night lights.  Install grab bars by the toilet and in the tub and shower. Do not use towel bars as grab bars.  Use non-skid mats or decals in the tub or shower.  If you need to sit down in the shower, use a plastic, non-slip stool.  Keep the floor dry. Clean up any water that spills on the floor as soon as it happens.  Remove soap buildup in the tub or shower regularly.  Attach bath mats securely with double-sided non-slip rug tape.  Do not have throw rugs and other things on the floor that can make you trip. What can I do in the bedroom?  Use night lights.  Make sure that you have a light by your bed that is easy to reach.  Do not use any sheets or blankets that are too big for your bed. They should not hang down onto the floor.  Have  a firm chair that has side arms. You can use this for support while you get dressed.  Do not have throw rugs and other things on the floor that can make you trip. What can I do in the kitchen?  Clean up any spills right away.  Avoid walking on wet floors.  Keep items that you use a lot in easy-to-reach places.  If you need to reach something above you, use a strong step stool that has a grab bar.  Keep electrical cords out of the way.  Do not use floor polish or wax that makes floors slippery. If you must use wax, use non-skid floor wax.  Do not have throw rugs and other things on the floor that can make you trip. What can I do with my stairs?  Do not leave any items on the stairs.  Make sure that there are handrails on both sides of the stairs and use them. Fix handrails that are broken or loose. Make sure that handrails are as long as the stairways.  Check any carpeting to make sure  that it is firmly attached to the stairs. Fix any carpet that is loose or worn.  Avoid having throw rugs at the top or bottom of the stairs. If you do have throw rugs, attach them to the floor with carpet tape.  Make sure that you have a light switch at the top of the stairs and the bottom of the stairs. If you do not have them, ask someone to add them for you. What else can I do to help prevent falls?  Wear shoes that:  Do not have high heels.  Have rubber bottoms.  Are comfortable and fit you well.  Are closed at the toe. Do not wear sandals.  If you use a stepladder:  Make sure that it is fully opened. Do not climb a closed stepladder.  Make sure that both sides of the stepladder are locked into place.  Ask someone to hold it for you, if possible.  Clearly mark and make sure that you can see:  Any grab bars or handrails.  First and last steps.  Where the edge of each step is.  Use tools that help you move around (mobility aids) if they are needed. These include:  Canes.  Walkers.  Scooters.  Crutches.  Turn on the lights when you go into a dark area. Replace any light bulbs as soon as they burn out.  Set up your furniture so you have a clear path. Avoid moving your furniture around.  If any of your floors are uneven, fix them.  If there are any pets around you, be aware of where they are.  Review your medicines with your doctor. Some medicines can make you feel dizzy. This can increase your chance of falling. Ask your doctor what other things that you can do to help prevent falls. This information is not intended to replace advice given to you by your health care provider. Make sure you discuss any questions you have with your health care provider. Document Released: 11/06/2008 Document Revised: 06/18/2015 Document Reviewed: 02/14/2014 Elsevier Interactive Patient Education  2017 [employer].

## 2017-05-01 NOTE — Progress Notes (Signed)
Subjective:   Marisa Sherman is a 57 y.o. female who presents for an Initial Medicare Annual Wellness Visit.  Review of Systems    N/A  Cardiac Risk Factors include: advanced age (>37men, >85 women);dyslipidemia;smoking/ tobacco exposure;obesity (BMI >30kg/m2)     Objective:    Today's Vitals   05/01/17 1407  BP: 126/84  Pulse: 68  Temp: 98.2 F (36.8 C)  TempSrc: Oral  Weight: 187 lb 12.8 oz (85.2 kg)  Height: 5\' 6"  (1.676 m)  PainSc: 0-No pain   Body mass index is 30.31 kg/m.  Advanced Directives 05/01/2017 11/27/2015 03/19/2015 09/19/2014 08/24/2014  Does Patient Have a Medical Advance Directive? No No No No No  Would patient like information on creating a medical advance directive? No - Patient declined - No - patient declined information No - patient declined information No - patient declined information  Some encounter information is confidential and restricted. Go to Review Flowsheets activity to see all data.    Current Medications (verified) Outpatient Encounter Medications as of 05/01/2017  Medication Sig  . allopurinol (ZYLOPRIM) 100 MG tablet Take 1 tablet (100 mg total) by mouth daily.  Marland Kitchen aspirin EC 81 MG tablet Take 81 mg by mouth daily.  Marland Kitchen atorvastatin (LIPITOR) 40 MG tablet Take 1 tablet (40 mg total) by mouth daily at 6 PM.  . doxycycline (VIBRA-TABS) 100 MG tablet Take 1 tablet (100 mg total) by mouth 2 (two) times daily.  Marland Kitchen FLUoxetine (PROZAC) 40 MG capsule Take 1 capsule (40 mg total) by mouth every morning.  . hydrOXYzine (VISTARIL) 25 MG capsule Take 25 mg by mouth every 8 (eight) hours as needed.   Marland Kitchen lisinopril (PRINIVIL,ZESTRIL) 20 MG tablet Take 1 tablet (20 mg total) by mouth daily.  Marland Kitchen oxybutynin (DITROPAN) 5 MG tablet Take 1 tablet (5 mg total) by mouth 2 (two) times daily.  . pantoprazole (PROTONIX) 40 MG tablet Take 1 tablet (40 mg total) by mouth daily.  Marland Kitchen REXULTI 2 MG TABS Take 2 mg by mouth at bedtime.   . traZODone (DESYREL) 100 MG tablet Take 1  tablet (100 mg total) by mouth at bedtime as needed for sleep (INSOMNIA). (Patient taking differently: Take 200 mg by mouth at bedtime as needed for sleep (INSOMNIA). )  . fluticasone (FLONASE) 50 MCG/ACT nasal spray Place 2 sprays into both nostrils daily. (Patient not taking: Reported on 05/01/2017)  . nicotine (NICODERM CQ - DOSED IN MG/24 HOURS) 21 mg/24hr patch Place 1 patch (21 mg total) onto the skin daily. (Patient not taking: Reported on 05/01/2017)  . ondansetron (ZOFRAN) 4 MG tablet Take 1 tablet (4 mg total) by mouth every 8 (eight) hours as needed for nausea or vomiting. (Patient not taking: Reported on 05/01/2017)   No facility-administered encounter medications on file as of 05/01/2017.     Allergies (verified) Codeine; Diphenhydramine hcl; Lithium; Sulfa antibiotics; and Amoxicillin   History: Past Medical History:  Diagnosis Date  . Abnormal Pap smear of cervix   . Allergy   . Anxiety   . Bipolar disorder (HCC)   . Cataract   . Depression   . GERD (gastroesophageal reflux disease)   . Hyperlipidemia   . Hypertension   . Overactive bladder    Past Surgical History:  Procedure Laterality Date  . CARPAL TUNNEL RELEASE Bilateral   . CERVIX LESION DESTRUCTION  1985  . CHOLECYSTECTOMY    . DILATION AND CURETTAGE OF UTERUS    . TONSILLECTOMY    . TUBAL LIGATION  Family History  Problem Relation Age of Onset  . Depression Mother   . Anxiety disorder Mother   . Asthma Mother   . Bipolar disorder Father   . Depression Sister   . Anxiety disorder Sister   . Depression Brother   . Anxiety disorder Brother   . Depression Brother   . Anxiety disorder Brother   . Cancer Neg Hx   . Diabetes Neg Hx   . Heart disease Neg Hx   . Colon cancer Neg Hx   . Breast cancer Neg Hx   . Ovarian cancer Neg Hx   . Cervical cancer Neg Hx    Social History   Socioeconomic History  . Marital status: Divorced    Spouse name: Not on file  . Number of children: 3  . Years of  education: Not on file  . Highest education level: GED or equivalent  Occupational History  . Occupation: Disablilty  Social Needs  . Financial resource strain: Not hard at all  . Food insecurity:    Worry: Never true    Inability: Never true  . Transportation needs:    Medical: No    Non-medical: No  Tobacco Use  . Smoking status: Current Every Day Smoker    Packs/day: 0.50    Years: 40.00    Pack years: 20.00    Types: Cigarettes  . Smokeless tobacco: Never Used  . Tobacco comment: started smoking at age 49; has quit on and off throughout the years. Has decreased cig use to 0.5 PPD  Substance and Sexual Activity  . Alcohol use: Yes    Comment: 1-2 beers every 6 months  . Drug use: No  . Sexual activity: Not Currently  Lifestyle  . Physical activity:    Days per week: Not on file    Minutes per session: Not on file  . Stress: Very much  Relationships  . Social connections:    Talks on phone: Not on file    Gets together: Not on file    Attends religious service: Not on file    Active member of club or organization: Not on file    Attends meetings of clubs or organizations: Not on file    Relationship status: Not on file  Other Topics Concern  . Not on file  Social History Narrative  . Not on file    Tobacco Counseling Ready to quit: Yes Counseling given: Not Answered Comment: started smoking at age 89; has quit on and off throughout the years. Has decreased cig use to 0.5 PPD   Clinical Intake:  Pre-visit preparation completed: Yes  Pain : No/denies pain Pain Score: 0-No pain     Nutritional Status: BMI > 30  Obese Nutritional Risks: None Diabetes: No  How often do you need to have someone help you when you read instructions, pamphlets, or other written materials from your doctor or pharmacy?: 1 - Never  Interpreter Needed?: No  Information entered by :: [facility], LPN   Activities of Daily Living In your present state of health, do you have any  difficulty performing the following activities: 05/01/2017 02/13/2017  Hearing? N N  Vision? N N  Difficulty concentrating or making decisions? Y N  Walking or climbing stairs? N N  Dressing or bathing? N N  Doing errands, shopping? N N  Preparing Food and eating ? N -  Using the Toilet? N -  In the past six months, have you accidently leaked urine? Y -  Comment Yes, nightly. -  Do you have problems with loss of bowel control? N -  Managing your Medications? N -  Managing your Finances? N -  Housekeeping or managing your Housekeeping? N -  Some recent data might be hidden     Immunizations and Health Maintenance Immunization History  Administered Date(s) Administered  . Influenza,inj,Quad PF,6+ Mos 01/04/2017  . Influenza-Unspecified 01/30/2017  . Pneumococcal Polysaccharide-23 07/04/2013   Health Maintenance Due  Topic Date Due  . Hepatitis C Screening  1960/11/01  . HIV Screening  06/18/1973  . TETANUS/TDAP  06/18/1977  . PAP SMEAR  06/19/1979  . MAMMOGRAM  06/18/2008  . COLONOSCOPY  06/18/2008    Patient Care Team: Erasmo DownerBacigalupo, Angela M, MD as PCP - General (Family Medicine) Sallye LatGroat, Christopher, MD as Consulting Physician (Ophthalmology)  Indicate any recent Medical Services you may have received from other than [facility] providers in the past year (date may be approximate).     Assessment:   This is a routine wellness examination for Marisa Sherman.  Hearing/Vision screen Vision Screening Comments: Pt sees Dr Dione BoozeGroat for vision checks yearly.   Dietary issues and exercise activities discussed: Current Exercise Habits: Home exercise routine, Type of exercise: walking, Time (Minutes): 60, Frequency (Times/Week): 3, Weekly Exercise (Minutes/Week): 180, Intensity: Mild, Exercise limited by: None identified  Goals    . DIET - INCREASE WATER INTAKE     Recommend increasing water intake to 4 glasses a day.       Depression Screen PHQ 2/9 Scores 05/01/2017 02/13/2017  PHQ - 2 Score 2  2  PHQ- 9 Score 9 10    Fall Risk Fall Risk  05/01/2017 02/13/2017  Falls in the past year? No No    Is the patient's home free of loose throw rugs in walkways, pet beds, electrical cords, etc?   yes      Grab bars in the bathroom? no      Handrails on the stairs?   yes      Adequate lighting?   yes  Timed Get Up and Go Performed N/A  Cognitive Function: Pt declined screening today.         Screening Tests Health Maintenance  Topic Date Due  . Hepatitis C Screening  1960/11/01  . HIV Screening  06/18/1973  . TETANUS/TDAP  06/18/1977  . PAP SMEAR  06/19/1979  . MAMMOGRAM  06/18/2008  . COLONOSCOPY  06/18/2008  . INFLUENZA VACCINE  08/24/2017    Qualifies for Shingles Vaccine? N/A  Cancer Screenings: Lung: Low Dose CT Chest recommended if Age 68-80 years, 30 pack-year currently smoking OR have quit w/in 15years. Patient does qualify. An Epic message has been sent to Glenna FellowsShawn Perkins, RN (Oncology Nurse Navigator) regarding the possible need for this exam. Ines BloomerShawn will review the patient's chart to determine if the patient truly qualifies for the exam. If the patient qualifies, Ines BloomerShawn will order the Low Dose CT of the chest to facilitate the scheduling of this exam. Breast: Up to date on Mammogram? No, pt plans to set this up this year.  Up to date of Bone Density/Dexa? N/A Colorectal: Referral sent 3/19. Pt awaiting a CB from the GI office to set up apt.   Additional Screenings: Hepatitis C Screening: Pt declines today.    Plan:  I have personally reviewed and addressed the Medicare Annual Wellness questionnaire and have noted the following in the patient's chart:  A. Medical and social history B. Use of alcohol, tobacco or illicit drugs  C.  Current medications and supplements D. Functional ability and status E.  Nutritional status F.  Physical activity G. Advance directives H. List of other physicians I.  Hospitalizations, surgeries, and ER visits in previous 12 months J.    Vitals K. Screenings such as hearing and vision if needed, cognitive and depression L. Referrals and appointments - none  In addition, I have reviewed and discussed with patient certain preventive protocols, quality metrics, and best practice recommendations. A written personalized care plan for preventive services as well as general preventive health recommendations were provided to patient.  See attached scanned questionnaire for additional information.   Signed,  Hyacinth Meeker, LPN Nurse Health Advisor   Nurse Recommendations: Pt would like to have a pap completed at he next CPE apt. Pt has not completed her colonoscopy due to waiting to hear back from the GI office to set apt up. Pt plans to set up mammogram this year- order on file. Pt declined the tetanus vaccine, HIV and Hep C lab screening today.

## 2017-05-01 NOTE — Telephone Encounter (Signed)
Returned patient's phone call.   LVM for callback to schedule appointment/procedure.

## 2017-05-05 ENCOUNTER — Telehealth: Payer: Self-pay

## 2017-05-05 NOTE — Telephone Encounter (Signed)
Returned patient's call to schedule procedure (x2). LVM for callback.

## 2017-05-08 ENCOUNTER — Telehealth: Payer: Self-pay

## 2017-05-08 ENCOUNTER — Other Ambulatory Visit: Payer: Self-pay

## 2017-05-08 DIAGNOSIS — Z1211 Encounter for screening for malignant neoplasm of colon: Secondary | ICD-10-CM

## 2017-05-08 NOTE — Progress Notes (Signed)
Gastroenterology Pre-Procedure Review  Request Date: 06/27/17 Requesting Physician: Dr. Tobi BastosAnna  PATIENT REVIEW QUESTIONS: The patient responded to the following health history questions as indicated:    1. Are you having any GI issues? no 2. Do you have a personal history of Polyps? no 3. Do you have a family history of Colon Cancer or Polyps? no 4. Diabetes Mellitus? no 5. Joint replacements in the past 12 months?no 6. Major health problems in the past 3 months?no 7. Any artificial heart valves, MVP, or defibrillator?no    MEDICATIONS & ALLERGIES:    Patient reports the following regarding taking any anticoagulation/antiplatelet therapy:   Plavix, Coumadin, Eliquis, Xarelto, Lovenox, Pradaxa, Brilinta, or Effient? no Aspirin? yes (81 mg daily)  Patient confirms/reports the following medications:  Current Outpatient Medications  Medication Sig Dispense Refill  . allopurinol (ZYLOPRIM) 100 MG tablet Take 1 tablet (100 mg total) by mouth daily. 30 tablet 5  . aspirin EC 81 MG tablet Take 81 mg by mouth daily.    Marland Kitchen. atorvastatin (LIPITOR) 40 MG tablet Take 1 tablet (40 mg total) by mouth daily at 6 PM. 30 tablet 5  . doxycycline (VIBRA-TABS) 100 MG tablet Take 1 tablet (100 mg total) by mouth 2 (two) times daily. 14 tablet 0  . FLUoxetine (PROZAC) 40 MG capsule Take 1 capsule (40 mg total) by mouth every morning. 30 capsule 0  . fluticasone (FLONASE) 50 MCG/ACT nasal spray Place 2 sprays into both nostrils daily. (Patient not taking: Reported on 05/01/2017) 16 g 6  . hydrOXYzine (VISTARIL) 25 MG capsule Take 25 mg by mouth every 8 (eight) hours as needed.     Marland Kitchen. lisinopril (PRINIVIL,ZESTRIL) 20 MG tablet Take 1 tablet (20 mg total) by mouth daily. 30 tablet 5  . nicotine (NICODERM CQ - DOSED IN MG/24 HOURS) 21 mg/24hr patch Place 1 patch (21 mg total) onto the skin daily. (Patient not taking: Reported on 05/01/2017) 28 patch 3  . ondansetron (ZOFRAN) 4 MG tablet Take 1 tablet (4 mg total) by  mouth every 8 (eight) hours as needed for nausea or vomiting. (Patient not taking: Reported on 05/01/2017) 20 tablet 0  . oxybutynin (DITROPAN) 5 MG tablet Take 1 tablet (5 mg total) by mouth 2 (two) times daily. 60 tablet 5  . pantoprazole (PROTONIX) 40 MG tablet Take 1 tablet (40 mg total) by mouth daily. 30 tablet 5  . REXULTI 2 MG TABS Take 2 mg by mouth at bedtime.     . traZODone (DESYREL) 100 MG tablet Take 1 tablet (100 mg total) by mouth at bedtime as needed for sleep (INSOMNIA). (Patient taking differently: Take 200 mg by mouth at bedtime as needed for sleep (INSOMNIA). ) 14 tablet 0   No current facility-administered medications for this visit.     Patient confirms/reports the following allergies:  Allergies  Allergen Reactions  . Codeine Nausea Only  . Diphenhydramine Hcl Other (See Comments)    Pt unsure why  . Lithium Itching  . Sulfa Antibiotics Nausea And Vomiting  . Amoxicillin     N & V    No orders of the defined types were placed in this encounter.   AUTHORIZATION INFORMATION Primary Insurance: 1D#: Group #:  Secondary Insurance: 1D#: Group #:  SCHEDULE INFORMATION: Date: 06/27/17 Time: Location:[facility]

## 2017-05-08 NOTE — Telephone Encounter (Signed)
LVM to inform patient that her colon[facility].  This was based on the "Pre-Procedure Review" patient indicated she wanted to have [facility].  Thanks [employer]

## 2017-05-09 ENCOUNTER — Telehealth: Payer: Self-pay | Admitting: Gastroenterology

## 2017-05-09 DIAGNOSIS — F332 Major depressive disorder, recurrent severe without psychotic features: Secondary | ICD-10-CM | POA: Diagnosis not present

## 2017-05-09 NOTE — Telephone Encounter (Signed)
Pt called wanting to schedule colonoscopy I informed her she is already scheduled 06/27/17 and instructions should be send per mail.

## 2017-05-23 DIAGNOSIS — F332 Major depressive disorder, recurrent severe without psychotic features: Secondary | ICD-10-CM | POA: Diagnosis not present

## 2017-06-02 ENCOUNTER — Encounter: Payer: Self-pay | Admitting: Family Medicine

## 2017-06-20 ENCOUNTER — Encounter: Payer: Medicare Other | Admitting: Family Medicine

## 2017-06-27 ENCOUNTER — Encounter: Admission: RE | Payer: Self-pay | Source: Ambulatory Visit

## 2017-06-27 ENCOUNTER — Ambulatory Visit: Admission: RE | Admit: 2017-06-27 | Payer: Medicare Other | Source: Ambulatory Visit | Admitting: Gastroenterology

## 2017-06-27 SURGERY — COLONOSCOPY WITH PROPOFOL
Anesthesia: General

## 2017-06-27 MED ORDER — PHENYLEPHRINE HCL 10 MG/ML IJ SOLN
INTRAMUSCULAR | Status: AC
  Filled 2017-06-27: qty 1

## 2017-06-27 MED ORDER — PROPOFOL 500 MG/50ML IV EMUL
INTRAVENOUS | Status: AC
  Filled 2017-06-27: qty 50

## 2017-06-27 MED ORDER — LIDOCAINE HCL (PF) 2 % IJ SOLN
INTRAMUSCULAR | Status: AC
  Filled 2017-06-27: qty 10

## 2017-07-05 ENCOUNTER — Encounter: Payer: Self-pay | Admitting: Family Medicine

## 2017-07-05 NOTE — Progress Notes (Deleted)
Patient: Marisa Sherman, Female    DOB: [birth year], 57 y.o.   MRN: [id number] Visit Date: 07/05/2017  Today's Provider: Shirlee Latch, MD   No chief complaint on file.  Subjective:     Complete Physical Marisa Sherman is a 57 y.o. female. She feels {DESC; WELL/FAIRLY WELL/POORLY:18703}. She reports exercising ***. She reports she is sleeping {DESC; WELL/FAIRLY WELL/POORLY:18703}.  Pt had her Medicare Well Visit on 05/01/2017. Last pap- 02/22/2016- NIL; HPV negative Last mammogram- 01/02/2014- BI-RADS 1 Colonoscopy was scheduled for 06/27/2017, but was cancelled.  -----------------------------------------------------------   Review of Systems  Social History   Socioeconomic History  . Marital status: Divorced    Spouse name: Not on file  . Number of children: 3  . Years of education: Not on file  . Highest education level: GED or equivalent  Occupational History  . Occupation: Disablilty  Social Needs  . Financial resource strain: Not hard at all  . Food insecurity:    Worry: Never true    Inability: Never true  . Transportation needs:    Medical: No    Non-medical: No  Tobacco Use  . Smoking status: Current Every Day Smoker    Packs/day: 0.50    Years: 40.00    Pack years: 20.00    Types: Cigarettes  . Smokeless tobacco: Never Used  . Tobacco comment: started smoking at age 1; has quit on and off throughout the years. Has decreased cig use to 0.5 PPD  Substance and Sexual Activity  . Alcohol use: Yes    Comment: 1-2 beers every 6 months  . Drug use: No  . Sexual activity: Not Currently  Lifestyle  . Physical activity:    Days per week: Not on file    Minutes per session: Not on file  . Stress: Very much  Relationships  . Social connections:    Talks on phone: Not on file    Gets together: Not on file    Attends religious service: Not on file    Active member of club or organization: Not on file    Attends meetings of clubs or organizations:  Not on file    Relationship status: Not on file  . Intimate partner violence:    Fear of current or ex partner: Not on file    Emotionally abused: Not on file    Physically abused: Not on file    Forced sexual activity: Not on file  Other Topics Concern  . Not on file  Social History Narrative  . Not on file    Past Medical History:  Diagnosis Date  . Abnormal Pap smear of cervix   . Allergy   . Anxiety   . Bipolar disorder (HCC)   . Cataract   . Depression   . GERD (gastroesophageal reflux disease)   . Gout   . Hyperlipidemia   . Hypertension   . Overactive bladder      Patient Active Problem List   Diagnosis Date Noted  . Anxiety 02/13/2017  . Tobacco abuse 02/13/2017  . Essential hypertension 02/13/2017  . Hyperlipidemia 02/13/2017  . Insomnia 02/13/2017  . Gout 02/13/2017  . GERD (gastroesophageal reflux disease) 02/13/2017  . Mixed incontinence 02/24/2015  . Rectocele 02/24/2015  . Bipolar 1 disorder, depressed (HCC) 07/02/2013    Past Surgical History:  Procedure Laterality Date  . CARPAL TUNNEL RELEASE Bilateral   . CERVIX LESION DESTRUCTION  1985  . CHOLECYSTECTOMY    . DILATION AND CURETTAGE  OF UTERUS    . TONSILLECTOMY    . TUBAL LIGATION      Her family history includes Anxiety disorder in her brother, brother, mother, and sister; Asthma in her mother; Bipolar disorder in her father; Depression in her brother, brother, mother, and sister. There is no history of Cancer, Diabetes, Heart disease, Colon cancer, Breast cancer, Ovarian cancer, or Cervical cancer.      Current Outpatient Medications:  .  allopurinol (ZYLOPRIM) 100 MG tablet, Take 1 tablet (100 mg total) by mouth daily., Disp: 30 tablet, Rfl: 5 .  aspirin EC 81 MG tablet, Take 81 mg by mouth daily., Disp: , Rfl:  .  atorvastatin (LIPITOR) 40 MG tablet, Take 1 tablet (40 mg total) by mouth daily at 6 PM., Disp: 30 tablet, Rfl: 5 .  doxycycline (VIBRA-TABS) 100 MG tablet, Take 1 tablet (100  mg total) by mouth 2 (two) times daily., Disp: 14 tablet, Rfl: 0 .  FLUoxetine (PROZAC) 40 MG capsule, Take 1 capsule (40 mg total) by mouth every morning., Disp: 30 capsule, Rfl: 0 .  fluticasone (FLONASE) 50 MCG/ACT nasal spray, Place 2 sprays into both nostrils daily. (Patient not taking: Reported on 05/01/2017), Disp: 16 g, Rfl: 6 .  hydrOXYzine (VISTARIL) 25 MG capsule, Take 25 mg by mouth every 8 (eight) hours as needed. , Disp: , Rfl:  .  lisinopril (PRINIVIL,ZESTRIL) 20 MG tablet, Take 1 tablet (20 mg total) by mouth daily., Disp: 30 tablet, Rfl: 5 .  nicotine (NICODERM CQ - DOSED IN MG/24 HOURS) 21 mg/24hr patch, Place 1 patch (21 mg total) onto the skin daily. (Patient not taking: Reported on 05/01/2017), Disp: 28 patch, Rfl: 3 .  ondansetron (ZOFRAN) 4 MG tablet, Take 1 tablet (4 mg total) by mouth every 8 (eight) hours as needed for nausea or vomiting. (Patient not taking: Reported on 05/01/2017), Disp: 20 tablet, Rfl: 0 .  oxybutynin (DITROPAN) 5 MG tablet, Take 1 tablet (5 mg total) by mouth 2 (two) times daily., Disp: 60 tablet, Rfl: 5 .  pantoprazole (PROTONIX) 40 MG tablet, Take 1 tablet (40 mg total) by mouth daily., Disp: 30 tablet, Rfl: 5 .  REXULTI 2 MG TABS, Take 2 mg by mouth at bedtime. , Disp: , Rfl:  .  traZODone (DESYREL) 100 MG tablet, Take 1 tablet (100 mg total) by mouth at bedtime as needed for sleep (INSOMNIA). (Patient taking differently: Take 200 mg by mouth at bedtime as needed for sleep (INSOMNIA). ), Disp: 14 tablet, Rfl: 0  Patient Care Team: Erasmo DownerBacigalupo, Angela M, MD as PCP - General (Family Medicine) Sallye LatGroat, Christopher, MD as Consulting Physician (Ophthalmology)     Objective:   Vitals: There were no vitals taken for this visit.  Physical Exam  Activities of Daily Living In your present state of health, do you have any difficulty performing the following activities: 05/01/2017 02/13/2017  Hearing? N N  Vision? N N  Difficulty concentrating or making decisions?  Y N  Walking or climbing stairs? N N  Dressing or bathing? N N  Doing errands, shopping? N N  Preparing Food and eating ? N -  Using the Toilet? N -  In the past six months, have you accidently leaked urine? Y -  Comment Yes, nightly. -  Do you have problems with loss of bowel control? N -  Managing your Medications? N -  Managing your Finances? N -  Housekeeping or managing your Housekeeping? N -  Some recent data might be hidden  Fall Risk Assessment Fall Risk  05/01/2017 02/13/2017  Falls in the past year? No No     Depression Screen PHQ 2/9 Scores 05/01/2017 02/13/2017  PHQ - 2 Score 2 2  PHQ- 9 Score 9 10       Assessment & Plan:    Annual Physical Reviewed patient's Family Medical History Reviewed and updated list of patient's medical providers Assessment of cognitive impairment was done Assessed patient's functional ability Established a written schedule for health screening services Health Risk Assessent Completed and Reviewed  Exercise Activities and Dietary recommendations Goals    . DIET - INCREASE WATER INTAKE     Recommend increasing water intake to 4 glasses a day.        Immunization History  Administered Date(s) Administered  . Influenza,inj,Quad PF,6+ Mos 01/04/2017  . Influenza-Unspecified 01/30/2017  . Pneumococcal Polysaccharide-23 07/04/2013    Health Maintenance  Topic Date Due  . Hepatitis C Screening  08-31-1960  . HIV Screening  06/18/1973  . TETANUS/TDAP  06/18/1977  . MAMMOGRAM  06/18/2008  . COLONOSCOPY  06/18/2008  . INFLUENZA VACCINE  08/24/2017  . PAP SMEAR  02/22/2019     Discussed health benefits of physical activity, and encouraged her to engage in regular exercise appropriate for her age and condition.    ------------------------------------------------------------------------------------------------------------

## 2017-09-30 ENCOUNTER — Emergency Department: Payer: Medicare Other

## 2017-09-30 ENCOUNTER — Other Ambulatory Visit: Payer: Self-pay

## 2017-09-30 ENCOUNTER — Encounter: Payer: Self-pay | Admitting: Emergency Medicine

## 2017-09-30 ENCOUNTER — Emergency Department
Admission: EM | Admit: 2017-09-30 | Discharge: 2017-09-30 | Disposition: A | Discharge status: Home / Self Care | Payer: Medicare Other | Attending: Emergency Medicine | Admitting: Emergency Medicine

## 2017-09-30 DIAGNOSIS — Y999 Unspecified external cause status: Secondary | ICD-10-CM | POA: Insufficient documentation

## 2017-09-30 DIAGNOSIS — Y929 Unspecified place or not applicable: Secondary | ICD-10-CM | POA: Diagnosis not present

## 2017-09-30 DIAGNOSIS — F17210 Nicotine dependence, cigarettes, uncomplicated: Secondary | ICD-10-CM | POA: Insufficient documentation

## 2017-09-30 DIAGNOSIS — Z7982 Long term (current) use of aspirin: Secondary | ICD-10-CM | POA: Insufficient documentation

## 2017-09-30 DIAGNOSIS — I10 Essential (primary) hypertension: Secondary | ICD-10-CM | POA: Insufficient documentation

## 2017-09-30 DIAGNOSIS — Y9301 Activity, walking, marching and hiking: Secondary | ICD-10-CM | POA: Diagnosis not present

## 2017-09-30 DIAGNOSIS — S2232XA Fracture of one rib, left side, initial encounter for closed fracture: Secondary | ICD-10-CM | POA: Insufficient documentation

## 2017-09-30 DIAGNOSIS — W0110XA Fall on same level from slipping, tripping and stumbling with subsequent striking against unspecified object, initial encounter: Secondary | ICD-10-CM | POA: Diagnosis not present

## 2017-09-30 DIAGNOSIS — S299XXA Unspecified injury of thorax, initial encounter: Secondary | ICD-10-CM | POA: Diagnosis present

## 2017-09-30 DIAGNOSIS — Z79899 Other long term (current) drug therapy: Secondary | ICD-10-CM | POA: Insufficient documentation

## 2017-09-30 MED ORDER — HYDROCODONE-ACETAMINOPHEN 5-325 MG PO TABS: 1.0000 | ORAL_TABLET | ORAL | 0 refills | Status: DC | PRN

## 2017-09-30 MED ORDER — MELOXICAM 15 MG PO TABS: 15.0000 mg | ORAL_TABLET | Freq: Every day | ORAL | 0 refills | Status: DC

## 2017-09-30 NOTE — ED Provider Notes (Signed)
[facility] Emergency Department Provider Note  ____________________________________________  Time seen: Approximately 4:00 PM  I have reviewed the triage vital signs and the nursing notes.   HISTORY  Chief Complaint Rib Injury    HPI Marisa Sherman is a 57 y.o. female who presents the emergency department complaining of left rib pain after a fall.  Patient reports that 2 days ago she slipped, fell landing directly on the left ribs.  She denied her head or lose consciousness.  Patient denies any shortness of breath, difficulty breathing.  She reports increasing left rib pain.  She states that it is more painful with palpation, any movement, coughing or sneezing.  Patient states that it has become difficult to sleep due to the pain.  No other injury or complaint at this time.    Past Medical History:  Diagnosis Date  . Abnormal Pap smear of cervix   . Allergy   . Anxiety   . Bipolar disorder (HCC)   . Cataract   . Depression   . GERD (gastroesophageal reflux disease)   . Gout   . Hyperlipidemia   . Hypertension   . Overactive bladder     Patient Active Problem List   Diagnosis Date Noted  . Anxiety 02/13/2017  . Tobacco abuse 02/13/2017  . Essential hypertension 02/13/2017  . Hyperlipidemia 02/13/2017  . Insomnia 02/13/2017  . Gout 02/13/2017  . GERD (gastroesophageal reflux disease) 02/13/2017  . Mixed incontinence 02/24/2015  . Rectocele 02/24/2015  . Bipolar 1 disorder, depressed (HCC) 07/02/2013    Past Surgical History:  Procedure Laterality Date  . CARPAL TUNNEL RELEASE Bilateral   . CERVIX LESION DESTRUCTION  1985  . CHOLECYSTECTOMY    . DILATION AND CURETTAGE OF UTERUS    . TONSILLECTOMY    . TUBAL LIGATION      Prior to Admission medications   Medication Sig Start Date End Date Taking? Authorizing Provider  allopurinol (ZYLOPRIM) 100 MG tablet Take 1 tablet (100 mg total) by mouth daily. 02/13/17   Erasmo Downer, MD   aspirin EC 81 MG tablet Take 81 mg by mouth daily.    [provider]  atorvastatin (LIPITOR) 40 MG tablet Take 1 tablet (40 mg total) by mouth daily at 6 PM. 02/13/17   Bacigalupo, Marzella Schlein, MD  doxycycline (VIBRA-TABS) 100 MG tablet Take 1 tablet (100 mg total) by mouth 2 (two) times daily. 03/10/17   Erasmo Downer, MD  FLUoxetine (PROZAC) 40 MG capsule Take 1 capsule (40 mg total) by mouth every morning. 07/11/13   Withrow, Everardo All, FNP  fluticasone (FLONASE) 50 MCG/ACT nasal spray Place 2 sprays into both nostrils daily. Patient not taking: Reported on 05/01/2017 03/09/17   Erasmo Downer, MD  HYDROcodone-acetaminophen (NORCO/VICODIN) 5-325 MG tablet Take 1 tablet by mouth every 4 (four) hours as needed for moderate pain. 09/30/17   Swade Shonka, Delorise Royals, PA-C  hydrOXYzine (VISTARIL) 25 MG capsule Take 25 mg by mouth every 8 (eight) hours as needed.  02/24/15   [provider]  lisinopril (PRINIVIL,ZESTRIL) 20 MG tablet Take 1 tablet (20 mg total) by mouth daily. 02/13/17   Erasmo Downer, MD  meloxicam (MOBIC) 15 MG tablet Take 1 tablet (15 mg total) by mouth daily. 09/30/17   Sumner Kirchman, Delorise Royals, PA-C  nicotine (NICODERM CQ - DOSED IN MG/24 HOURS) 21 mg/24hr patch Place 1 patch (21 mg total) onto the skin daily. Patient not taking: Reported on 05/01/2017 02/13/17   Erasmo Downer,  MD  ondansetron (ZOFRAN) 4 MG tablet Take 1 tablet (4 mg total) by mouth every 8 (eight) hours as needed for nausea or vomiting. Patient not taking: Reported on 05/01/2017 03/10/17   Erasmo Downer, MD  oxybutynin (DITROPAN) 5 MG tablet Take 1 tablet (5 mg total) by mouth 2 (two) times daily. 02/13/17   Erasmo Downer, MD  pantoprazole (PROTONIX) 40 MG tablet Take 1 tablet (40 mg total) by mouth daily. 02/13/17   Bacigalupo, Marzella Schlein, MD  REXULTI 2 MG TABS Take 2 mg by mouth at bedtime.  01/13/15   [provider]  traZODone (DESYREL) 100 MG tablet Take 1 tablet (100 mg  total) by mouth at bedtime as needed for sleep (INSOMNIA). Patient taking differently: Take 200 mg by mouth at bedtime as needed for sleep (INSOMNIA).  07/11/13   Withrow, Everardo All, FNP    Allergies Codeine; Diphenhydramine hcl; Lithium; Sulfa antibiotics; and Amoxicillin  Family History  Problem Relation Age of Onset  . Depression Mother   . Anxiety disorder Mother   . Asthma Mother   . Bipolar disorder Father   . Depression Sister   . Anxiety disorder Sister   . Depression Brother   . Anxiety disorder Brother   . Depression Brother   . Anxiety disorder Brother   . Cancer Neg Hx   . Diabetes Neg Hx   . Heart disease Neg Hx   . Colon cancer Neg Hx   . Breast cancer Neg Hx   . Ovarian cancer Neg Hx   . Cervical cancer Neg Hx     Social History Social History   Tobacco Use  . Smoking status: Current Every Day Smoker    Packs/day: 0.50    Years: 40.00    Pack years: 20.00    Types: Cigarettes  . Smokeless tobacco: Never Used  . Tobacco comment: started smoking at age 76; has quit on and off throughout the years. Has decreased cig use to 0.5 PPD  Substance Use Topics  . Alcohol use: Yes    Comment: 1-2 beers every 6 months  . Drug use: No     Review of Systems  Constitutional: No fever/chills Eyes: No visual changes. No discharge ENT: No upper respiratory complaints. Cardiovascular: no chest pain. Respiratory: no cough. No SOB. Gastrointestinal: No abdominal pain.  No nausea, no vomiting.   Musculoskeletal: Positive for left rib pain. Skin: Negative for rash, abrasions, lacerations, ecchymosis. Neurological: Negative for headaches, focal weakness or numbness. 10-point ROS otherwise negative.  ____________________________________________   PHYSICAL EXAM:  VITAL SIGNS: ED Triage Vitals [09/30/17 1412]  Enc Vitals Group     BP (!) 151/104     Pulse Rate 87     Resp 18     Temp 98.1 F (36.7 C)     Temp Source Oral     SpO2 96 %     Weight 171 lb (77.6  kg)     Height 5\' 6"  (1.676 m)     Head Circumference      Peak Flow      Pain Score 8     Pain Loc      Pain Edu?      Excl. in GC?      Constitutional: Alert and oriented. Well appearing and in no acute distress. Eyes: Conjunctivae are normal. PERRL. EOMI. Head: Atraumatic. ENT:      Ears:       Nose: No congestion/rhinnorhea.      Mouth/Throat:  Mucous membranes are moist.  Neck: No stridor.  No cervical spine tenderness to palpation.  Cardiovascular: Normal rate, regular rhythm. Normal S1 and S2.  Good peripheral circulation. Respiratory: Normal respiratory effort without tachypnea or retractions. Lungs CTAB. Good air entry to the bases with no decreased or absent breath sounds. Musculoskeletal: Full range of motion to all extremities. No gross deformities appreciated.  Visualization of left rib cage reveals no visible signs of trauma with laceration, abrasions, edema, ecchymosis.  Patient is very tender to palpation over the left lateral eighth through 10th ribs.  No palpable abnormality.  No crepitus to palpation.  No subcutaneous emphysema.  Good underlying breath sounds bilaterally.  No paradoxical chest wall movement Neurologic:  Normal speech and language. No gross focal neurologic deficits are appreciated.  Skin:  Skin is warm, dry and intact. No rash noted. Psychiatric: Mood and affect are normal. Speech and behavior are normal. Patient exhibits appropriate insight and judgement.   ____________________________________________   LABS (all labs ordered are listed, but only abnormal results are displayed)  Labs Reviewed - No data to display ____________________________________________  EKG   ____________________________________________  RADIOLOGY I personally viewed and evaluated these images as part of my medical decision making, as well as reviewing the written report by the radiologist.  Dg Ribs Unilateral W/chest Left  Result Date: 09/30/2017 CLINICAL DATA:   Left rib pain post fall 2 days ago. EXAM: LEFT RIBS AND CHEST - 3+ VIEW COMPARISON:  None. FINDINGS: Minimally displaced lateral ninth rib fracture. No evidence of pneumothorax. Normal cardiac silhouette. Mild calcific atherosclerotic disease at the aortic arch. Atelectasis versus scarring in the right lower hemithorax. IMPRESSION: Minimally displaced left lateral ninth rib fracture. Electronically Signed   By: Ted Mcalpine M.D.   On: 09/30/2017 15:12    ____________________________________________    PROCEDURES  Procedure(s) performed:    Procedures    Medications - No data to display   ____________________________________________   INITIAL IMPRESSION / ASSESSMENT AND PLAN / ED COURSE  Pertinent labs & imaging results that were available during my care of the patient were reviewed by me and considered in my medical decision making (see chart for details).  Review of the [place] CSRS was performed in accordance of the [facility] prior to dispensing any controlled drugs.      Patient's diagnosis is consistent with rib fracture. Patient presented to the emergency department complaining of a rib injury 2 days prior. No shortness of breath. Exam was reassuring. Patient with solitary non-displaced rib fracture to the 9th rib. No pneumothorax. No indication for further workup.. Patient will be discharged home with prescriptions for norco and meloxicam. Patient is to follow up with primary care as needed or otherwise directed. Patient is given ED precautions to return to the ED for any worsening or new symptoms.     ____________________________________________  FINAL CLINICAL IMPRESSION(S) / ED DIAGNOSES  Final diagnoses:  Closed fracture of one rib of left side, initial encounter      NEW MEDICATIONS STARTED DURING THIS VISIT:  ED Discharge Orders         Ordered    meloxicam (MOBIC) 15 MG tablet  Daily     09/30/17 1642    HYDROcodone-acetaminophen (NORCO/VICODIN) 5-325 MG  tablet  Every 4 hours PRN     09/30/17 1642              This chart was dictated using voice recognition software/Dragon. Despite best efforts to proofread, errors can occur which can change  the meaning. Any change was purely unintentional.    Racheal Patches, PA-C 09/30/17 1642    Jene Every, MD 09/30/17 [phone]

## 2017-09-30 NOTE — ED Triage Notes (Signed)
Pt tripped and fell 2 days ago. C/o left rib pain. Denies injury. Unlabored.  VSS.  Pain with movement.

## 2017-09-30 NOTE — ED Notes (Signed)

## 2018-02-07 ENCOUNTER — Ambulatory Visit
Admission: RE | Admit: 2018-02-07 | Discharge: 2018-02-07 | Disposition: A | Discharge status: Home / Self Care | Payer: Medicare Other | Attending: Family Medicine | Admitting: Family Medicine

## 2018-02-07 ENCOUNTER — Other Ambulatory Visit: Payer: Self-pay | Admitting: Family Medicine

## 2018-02-07 ENCOUNTER — Ambulatory Visit (INDEPENDENT_AMBULATORY_CARE_PROVIDER_SITE_OTHER): Payer: Medicare Other | Admitting: Family Medicine

## 2018-02-07 ENCOUNTER — Ambulatory Visit
Admission: RE | Admit: 2018-02-07 | Discharge: 2018-02-07 | Disposition: A | Discharge status: Home / Self Care | Payer: Medicare Other | Source: Ambulatory Visit | Attending: Family Medicine | Admitting: Family Medicine

## 2018-02-07 ENCOUNTER — Encounter: Payer: Self-pay | Admitting: Family Medicine

## 2018-02-07 VITALS — BP 123/85 | HR 77 | Temp 98.4°F | Wt 162.6 lb

## 2018-02-07 DIAGNOSIS — D649 Anemia, unspecified: Secondary | ICD-10-CM | POA: Insufficient documentation

## 2018-02-07 DIAGNOSIS — M1711 Unilateral primary osteoarthritis, right knee: Secondary | ICD-10-CM | POA: Diagnosis not present

## 2018-02-07 DIAGNOSIS — G8929 Other chronic pain: Secondary | ICD-10-CM | POA: Insufficient documentation

## 2018-02-07 DIAGNOSIS — M25561 Pain in right knee: Secondary | ICD-10-CM | POA: Insufficient documentation

## 2018-02-07 MED ORDER — OXYBUTYNIN CHLORIDE 5 MG PO TABS: 5.0000 mg | ORAL_TABLET | Freq: Two times a day (BID) | ORAL | 5 refills | Status: DC

## 2018-02-07 MED ORDER — ATORVASTATIN CALCIUM 40 MG PO TABS: 40.0000 mg | ORAL_TABLET | Freq: Every day | ORAL | 5 refills | Status: DC

## 2018-02-07 MED ORDER — PANTOPRAZOLE SODIUM 40 MG PO TBEC: 40.0000 mg | DELAYED_RELEASE_TABLET | Freq: Every day | ORAL | 5 refills | Status: DC

## 2018-02-07 MED ORDER — HYDROXYZINE PAMOATE 25 MG PO CAPS: 25.0000 mg | ORAL_CAPSULE | Freq: Three times a day (TID) | ORAL | 5 refills | Status: DC | PRN

## 2018-02-07 MED ORDER — LISINOPRIL 20 MG PO TABS: 20.0000 mg | ORAL_TABLET | Freq: Every day | ORAL | 5 refills | Status: DC

## 2018-02-07 MED ORDER — TRAZODONE HCL 100 MG PO TABS: 200.0000 mg | ORAL_TABLET | Freq: Every evening | ORAL | 5 refills | Status: DC | PRN

## 2018-02-07 MED ORDER — ALLOPURINOL 100 MG PO TABS: 100.0000 mg | ORAL_TABLET | Freq: Every day | ORAL | 5 refills | Status: DC

## 2018-02-07 MED ORDER — MELOXICAM 15 MG PO TABS: 15.0000 mg | ORAL_TABLET | Freq: Every day | ORAL | 1 refills | Status: DC
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
## 2018-02-07 NOTE — Patient Instructions (Signed)

## 2018-02-07 NOTE — Telephone Encounter (Signed)
Allopurinol and hydroxyzine wasn't called in.  Please call them to [employer] [facility]

## 2018-02-08 ENCOUNTER — Telehealth: Payer: Self-pay

## 2018-02-08 DIAGNOSIS — G8929 Other chronic pain: Secondary | ICD-10-CM | POA: Insufficient documentation

## 2018-02-08 DIAGNOSIS — M25561 Pain in right knee: Principal | ICD-10-CM

## 2018-02-08 NOTE — Telephone Encounter (Signed)
LMTCB

## 2018-02-08 NOTE — Assessment & Plan Note (Signed)
Longstanding problem, but newly discussed Suspect that she has arthritis, possibly somewhat related to previous trauma, but most likely degenerative X-rays to confirm diagnosis and evaluate degree of arthritis We discussed conservative management with rest, ice, compression, elevation We will treat with NSAIDs Discussed home exercise program At follow-up in about 4 weeks, we will reevaluate and if she is still having effusion and pain, will consider corticosteroid injection

## 2018-02-08 NOTE — Telephone Encounter (Signed)
-----   Message from Erasmo Downer, MD sent at 02/08/2018 10:55 AM EST ----- Xray confirms knee arthritis.  No bony abnormalities.  No changes to plan from OV

## 2018-02-13 NOTE — Telephone Encounter (Signed)
Pt returned missed call. Please call pt back with Xray results.  Thanks, [employer]

## 2018-02-14 NOTE — Telephone Encounter (Signed)
Patient advised as directed below.  Thanks,  -Joseline 

## 2018-02-14 NOTE — Telephone Encounter (Signed)
LMTCB

## 2018-03-06 NOTE — Progress Notes (Deleted)
Patient: Marisa Sherman Female    DOB: September 12, 1960   58 y.o.   MRN: [id number] Visit Date: 03/06/2018  Today's Provider: Shirlee Latch, MD   No chief complaint on file.  Subjective:     HPI Chronic pain of right knee: Patient presents for a 4 week follow up. Last OV was on 02/07/2018. Patient advised to use NSAIDS and home exercise program to see if she gets any relief, and if not can consider corticosteroid injection. She reports good compliance with recommendations. She states symptoms are   Allergies  Allergen Reactions  . Codeine Nausea Only  . Diphenhydramine Hcl Other (See Comments)    Pt unsure why  . Lithium Itching  . Sulfa Antibiotics Nausea And Vomiting  . Amoxicillin     N & V     Current Outpatient Medications:  .  allopurinol (ZYLOPRIM) 100 MG tablet, Take 1 tablet (100 mg total) by mouth daily., Disp: 30 tablet, Rfl: 5 .  aspirin EC 81 MG tablet, Take 81 mg by mouth daily., Disp: , Rfl:  .  atorvastatin (LIPITOR) 40 MG tablet, Take 1 tablet (40 mg total) by mouth daily at 6 PM., Disp: 30 tablet, Rfl: 5 .  FLUoxetine (PROZAC) 40 MG capsule, Take 1 capsule (40 mg total) by mouth every morning., Disp: 30 capsule, Rfl: 0 .  fluticasone (FLONASE) 50 MCG/ACT nasal spray, Place 2 sprays into both nostrils daily. (Patient not taking: Reported on 05/01/2017), Disp: 16 g, Rfl: 6 .  hydrOXYzine (VISTARIL) 25 MG capsule, Take 1 capsule (25 mg total) by mouth every 8 (eight) hours as needed for anxiety., Disp: 60 capsule, Rfl: 5 .  lisinopril (PRINIVIL,ZESTRIL) 20 MG tablet, Take 1 tablet (20 mg total) by mouth daily., Disp: 30 tablet, Rfl: 5 .  meloxicam (MOBIC) 15 MG tablet, Take 1 tablet (15 mg total) by mouth daily., Disp: 30 tablet, Rfl: 1 .  ondansetron (ZOFRAN) 4 MG tablet, Take 1 tablet (4 mg total) by mouth every 8 (eight) hours as needed for nausea or vomiting. (Patient not taking: Reported on 05/01/2017), Disp: 20 tablet, Rfl: 0 .  oxybutynin (DITROPAN) 5 MG  tablet, Take 1 tablet (5 mg total) by mouth 2 (two) times daily., Disp: 60 tablet, Rfl: 5 .  pantoprazole (PROTONIX) 40 MG tablet, Take 1 tablet (40 mg total) by mouth daily., Disp: 30 tablet, Rfl: 5 .  REXULTI 2 MG TABS, Take 2 mg by mouth at bedtime. , Disp: , Rfl:  .  traZODone (DESYREL) 100 MG tablet, Take 2 tablets (200 mg total) by mouth at bedtime as needed for sleep (INSOMNIA)., Disp: 60 tablet, Rfl: 5  Review of Systems  Constitutional: Negative.   Respiratory: Negative.   Cardiovascular: Negative.   Musculoskeletal:       Right knee pain     Social History   Tobacco Use  . Smoking status: Current Every Day Smoker    Packs/day: 0.50    Years: 40.00    Pack years: 20.00    Types: Cigarettes  . Smokeless tobacco: Never Used  . Tobacco comment: started smoking at age 31; has quit on and off throughout the years. Has decreased cig use to 0.5 PPD  Substance Use Topics  . Alcohol use: Yes    Comment: 1-2 beers every 6 months      Objective:   There were no vitals taken for this visit. There were no vitals filed for this visit.   Physical Exam  Assessment & Plan        Lavon Paganini, MD  [facility]

## 2018-03-07 ENCOUNTER — Ambulatory Visit: Payer: Medicare Other | Admitting: Family Medicine

## 2018-05-03 ENCOUNTER — Ambulatory Visit: Payer: Self-pay

## 2018-05-22 ENCOUNTER — Other Ambulatory Visit: Payer: Self-pay

## 2018-05-22 NOTE — Telephone Encounter (Signed)
Patient is requesting refills on medication. Tried to offer e-visit and she states she has no way to do one.

## 2018-05-23 NOTE — Telephone Encounter (Signed)
LMTCB to schedule a telephone visit

## 2018-05-23 NOTE — Telephone Encounter (Signed)
She is way overdue for f/u.  Can she do a phone call visit?

## 2018-05-24 NOTE — Progress Notes (Signed)
   Patient not available at time of evisit and unable to be reached by phone   This encounter was created in error - please disregard. 

## 2018-05-24 NOTE — Telephone Encounter (Signed)
Left message for patient to call back to schedule telephone visit.

## 2018-05-25 ENCOUNTER — Encounter: Payer: Medicare Other | Admitting: Family Medicine

## 2018-05-25 ENCOUNTER — Encounter: Payer: Self-pay | Admitting: Family Medicine

## 2018-05-28 ENCOUNTER — Telehealth: Payer: Self-pay | Admitting: Family Medicine

## 2018-05-28 NOTE — Telephone Encounter (Signed)
Patient scheduled for a telephone visit for Wednesday.

## 2018-05-28 NOTE — Telephone Encounter (Signed)
Pt called saying she wasn't able to do the phone appt last week because her phone was messed up but she now has a new phone and wants to reschedule the appt.  It was for medication refills  CB# [phone]  Thanks Barth Kirks

## 2018-05-30 ENCOUNTER — Ambulatory Visit (INDEPENDENT_AMBULATORY_CARE_PROVIDER_SITE_OTHER): Payer: Medicare Other | Admitting: Family Medicine

## 2018-05-30 ENCOUNTER — Encounter: Payer: Self-pay | Admitting: Family Medicine

## 2018-05-30 DIAGNOSIS — G8929 Other chronic pain: Secondary | ICD-10-CM

## 2018-05-30 DIAGNOSIS — F419 Anxiety disorder, unspecified: Secondary | ICD-10-CM

## 2018-05-30 DIAGNOSIS — E782 Mixed hyperlipidemia: Secondary | ICD-10-CM

## 2018-05-30 DIAGNOSIS — K219 Gastro-esophageal reflux disease without esophagitis: Secondary | ICD-10-CM | POA: Diagnosis not present

## 2018-05-30 DIAGNOSIS — F319 Bipolar disorder, unspecified: Secondary | ICD-10-CM

## 2018-05-30 DIAGNOSIS — G4700 Insomnia, unspecified: Secondary | ICD-10-CM | POA: Diagnosis not present

## 2018-05-30 DIAGNOSIS — M1A9XX0 Chronic gout, unspecified, without tophus (tophi): Secondary | ICD-10-CM | POA: Diagnosis not present

## 2018-05-30 DIAGNOSIS — I10 Essential (primary) hypertension: Secondary | ICD-10-CM

## 2018-05-30 DIAGNOSIS — M25561 Pain in right knee: Secondary | ICD-10-CM

## 2018-05-30 DIAGNOSIS — G6289 Other specified polyneuropathies: Secondary | ICD-10-CM

## 2018-05-30 MED ORDER — LISINOPRIL 20 MG PO TABS: 20.0000 mg | ORAL_TABLET | Freq: Every day | ORAL | 3 refills | Status: DC

## 2018-05-30 MED ORDER — ATORVASTATIN CALCIUM 40 MG PO TABS: 40.0000 mg | ORAL_TABLET | Freq: Every day | ORAL | 3 refills | Status: DC

## 2018-05-30 MED ORDER — PANTOPRAZOLE SODIUM 40 MG PO TBEC: 40.0000 mg | DELAYED_RELEASE_TABLET | Freq: Every day | ORAL | 3 refills | Status: DC

## 2018-05-30 MED ORDER — REXULTI 2 MG PO TABS: 2.0000 mg | ORAL_TABLET | Freq: Every day | ORAL | 0 refills | Status: DC

## 2018-05-30 MED ORDER — HYDROXYZINE PAMOATE 25 MG PO CAPS: 25.0000 mg | ORAL_CAPSULE | Freq: Three times a day (TID) | ORAL | 3 refills | Status: DC | PRN

## 2018-05-30 MED ORDER — ALLOPURINOL 100 MG PO TABS: 100.0000 mg | ORAL_TABLET | Freq: Every day | ORAL | 3 refills | Status: DC

## 2018-05-30 MED ORDER — TRAZODONE HCL 100 MG PO TABS: 200.0000 mg | ORAL_TABLET | Freq: Every evening | ORAL | 0 refills | Status: DC | PRN

## 2018-05-30 MED ORDER — FLUOXETINE HCL 40 MG PO CAPS: 40.0000 mg | ORAL_CAPSULE | Freq: Every morning | ORAL | 0 refills | Status: DC

## 2018-05-30 MED ORDER — MELOXICAM 15 MG PO TABS: 15.0000 mg | ORAL_TABLET | Freq: Every day | ORAL | 0 refills | Status: DC

## 2018-05-30 NOTE — Progress Notes (Signed)
Patient: Marisa Sherman Female    DOB: [birth year]   58 y.o.   MRN: [id number] Visit Date: 06/01/2018  Today's Provider: Shirlee Latch, MD   Chief Complaint  Patient presents with  . Hypertension  . Hyperlipidemia   Subjective:    Virtual Visit via Telephone Note  I connected with Marisa Sherman on 06/01/18 at  1:40 PM EDT by a audio-only telemedicine application and verified that I am speaking with the correct person using two identifiers.   Patient location: home Provider location: [facility] Persons involved in the visit: patient, provider  I discussed the limitations of evaluation and management by telemedicine and the availability of in person appointments. The patient expressed understanding and agreed to proceed.   HPI  Hypertension, follow-up:  BP Readings from Last 3 Encounters:  02/07/18 123/85  09/30/17 (!) 151/104  05/01/17 126/84    She was last seen for hypertension 1 years ago.  BP at that visit was 126/84. Management since that visit includes no changes.She reports good compliance with treatment. She is not having side effects.  She is not exercising. She is adherent to low salt diet.   Outside blood pressures are not being checked. She is experiencing none.  Patient denies chest pain, chest pressure/discomfort, claudication, dyspnea, exertional chest pressure/discomfort, fatigue, irregular heart beat, lower extremity edema, near-syncope, orthopnea, palpitations, paroxysmal nocturnal dyspnea, syncope and tachypnea.   Cardiovascular risk factors include dyslipidemia, hypertension and smoking/ tobacco exposure.  Use of agents associated with hypertension: none.   ------------------------------------------------------------------------    Lipid/Cholesterol, Follow-up:   Last seen for this 1 years ago.  Management since that visit includes no change.  Last Lipid Panel: No results found for: CHOL, HDL, LDLCALC, LDLDIRECT, TRIG,  CHOLHDL  She reports good compliance with treatment. She is not having side effects.   Wt Readings from Last 3 Encounters:  02/07/18 162 lb 9.6 oz (73.8 kg)  09/30/17 171 lb (77.6 kg)  05/01/17 187 lb 12.8 oz (85.2 kg)    ------------------------------------------------------------------------ R knee pain Getting worse Radiates up and down the leg Xrays in 01/2018 No steroid injections previously Using NSAIDs without relief    Allergies  Allergen Reactions  . Codeine Nausea Only  . Diphenhydramine Hcl Other (See Comments)    Pt unsure why  . Lithium Itching  . Sulfa Antibiotics Nausea And Vomiting  . Amoxicillin     N & V     Current Outpatient Medications:  .  allopurinol (ZYLOPRIM) 100 MG tablet, Take 1 tablet (100 mg total) by mouth daily., Disp: 90 tablet, Rfl: 3 .  aspirin EC 81 MG tablet, Take 81 mg by mouth daily., Disp: , Rfl:  .  atorvastatin (LIPITOR) 40 MG tablet, Take 1 tablet (40 mg total) by mouth daily at 6 PM., Disp: 90 tablet, Rfl: 3 .  FLUoxetine (PROZAC) 40 MG capsule, Take 1 capsule (40 mg total) by mouth every morning., Disp: 90 capsule, Rfl: 0 .  hydrOXYzine (VISTARIL) 25 MG capsule, Take 1 capsule (25 mg total) by mouth every 8 (eight) hours as needed for anxiety., Disp: 180 capsule, Rfl: 3 .  lisinopril (ZESTRIL) 20 MG tablet, Take 1 tablet (20 mg total) by mouth daily., Disp: 90 tablet, Rfl: 3 .  meloxicam (MOBIC) 15 MG tablet, Take 1 tablet (15 mg total) by mouth daily., Disp: 90 tablet, Rfl: 0 .  oxybutynin (DITROPAN) 5 MG tablet, Take 1 tablet (5 mg total) by mouth 2 (two) times daily., Disp:  60 tablet, Rfl: 5 .  pantoprazole (PROTONIX) 40 MG tablet, Take 1 tablet (40 mg total) by mouth daily., Disp: 90 tablet, Rfl: 3 .  REXULTI 2 MG TABS, Take 2 mg by mouth at bedtime., Disp: 90 tablet, Rfl: 0 .  traZODone (DESYREL) 100 MG tablet, Take 2 tablets (200 mg total) by mouth at bedtime as needed for sleep (INSOMNIA)., Disp: 180 tablet, Rfl: 0 .   fluticasone (FLONASE) 50 MCG/ACT nasal spray, Place 2 sprays into both nostrils daily. (Patient not taking: Reported on 05/01/2017), Disp: 16 g, Rfl: 6 .  ondansetron (ZOFRAN) 4 MG tablet, Take 1 tablet (4 mg total) by mouth every 8 (eight) hours as needed for nausea or vomiting. (Patient not taking: Reported on 05/01/2017), Disp: 20 tablet, Rfl: 0  Review of Systems  Constitutional: Negative.   Respiratory: Negative.   Cardiovascular: Negative.   Musculoskeletal: Negative.     Social History   Tobacco Use  . Smoking status: Current Every Day Smoker    Packs/day: 0.50    Years: 40.00    Pack years: 20.00    Types: Cigarettes  . Smokeless tobacco: Never Used  . Tobacco comment: started smoking at age 47; has quit on and off throughout the years. Has decreased cig use to 0.5 PPD  Substance Use Topics  . Alcohol use: Yes    Comment: 1-2 beers every 6 months      Objective:   There were no vitals taken for this visit. There were no vitals filed for this visit.   Physical Exam      Assessment & Plan      I discussed the assessment and treatment plan with the patient. The patient was provided an opportunity to ask questions and all were answered. The patient agreed with the plan and demonstrated an understanding of the instructions.   The patient was advised to call back or seek an in-person evaluation if the symptoms worsen or if the condition fails to improve as anticipated.  Problem List Items Addressed This Visit      Cardiovascular and Mediastinum   Essential hypertension - Primary    Currently uncontrolled, but she has been out of her medications We will refill her lisinopril Recheck at next visit Plan to recheck metabolic panel at next visit HCTZ was discontinued given her history of gout requiring allopurinol therapy      Relevant Medications   atorvastatin (LIPITOR) 40 MG tablet   lisinopril (ZESTRIL) 20 MG tablet     Digestive   GERD (gastroesophageal reflux  disease)    Well-controlled Continue Protonix Have discussed with the patient the possible consequences of long-term PPI use      Relevant Medications   pantoprazole (PROTONIX) 40 MG tablet     Other   Bipolar 1 disorder, depressed (HCC)    Patient was previously managed by [facility] psychiatry, but has been unable to get an appointment with them recently I refilled her medications for her Advised her that she does need to continue to follow-up with psychiatry No manic symptoms      Anxiety    Previously managed by [facility] psychiatry Advised her that she does need to follow-up with them I did refill her medications today      Relevant Medications   FLUoxetine (PROZAC) 40 MG capsule   hydrOXYzine (VISTARIL) 25 MG capsule   traZODone (DESYREL) 100 MG tablet   Hyperlipidemia    Continue Lipitor Plan to recheck CMP and lipid panel at next in-person  visit      Relevant Medications   atorvastatin (LIPITOR) 40 MG tablet   lisinopril (ZESTRIL) 20 MG tablet   Insomnia    Has been followed by psychiatry I did refill her trazodone Advised her that she needs to reestablish care with psychiatry      Gout    Well-controlled no recent flares Continue allopurinol Recheck uric acid at next visit Avoids thiazide diuretics      Chronic pain of right knee    Chronic with worsening symptoms within the last month She did have x-rays at last visit that do show some degenerative arthritis We have discussed conservative management with rest, ice, compression, elevation Can use NSAIDs as needed Discussed home exercise program and quad strengthening She can come in for a steroid injection and exam later this week or next week if she desires      Relevant Medications   FLUoxetine (PROZAC) 40 MG capsule   meloxicam (MOBIC) 15 MG tablet   traZODone (DESYREL) 100 MG tablet       Return in about 3 months (around 08/30/2018) for Chronic disease follow-up.   The entirety of the information  documented in the History of Present Illness, Review of Systems and Physical Exam were personally obtained by me. Portions of this information were initially documented by Presley RaddleNikki Walston, CMA and reviewed by me for thoroughness and accuracy.    Erasmo DownerBacigalupo,  M, MD, MPH [facility] 06/01/2018 4:37 PM

## 2018-05-31 ENCOUNTER — Ambulatory Visit: Payer: Medicare Other | Admitting: Physician Assistant

## 2018-06-01 NOTE — Assessment & Plan Note (Signed)
Well-controlled no recent flares Continue allopurinol Recheck uric acid at next visit Avoids thiazide diuretics

## 2018-06-01 NOTE — Assessment & Plan Note (Signed)
Previously managed by [facility] psychiatry Advised her that she does need to follow-up with them I did refill her medications today

## 2018-06-01 NOTE — Assessment & Plan Note (Signed)
Has been followed by psychiatry I did refill her trazodone Advised her that she needs to reestablish care with psychiatry

## 2018-06-01 NOTE — Assessment & Plan Note (Signed)
Patient was previously managed by [facility] psychiatry, but has been unable to get an appointment with them recently I refilled her medications for her Advised her that she does need to continue to follow-up with psychiatry No manic symptoms

## 2018-06-01 NOTE — Assessment & Plan Note (Signed)
Currently uncontrolled, but she has been out of her medications We will refill her lisinopril Recheck at next visit Plan to recheck metabolic panel at next visit HCTZ was discontinued given her history of gout requiring allopurinol therapy

## 2018-06-01 NOTE — Assessment & Plan Note (Signed)
Continue Lipitor Plan to recheck CMP and lipid panel at next in-person visit

## 2018-06-01 NOTE — Assessment & Plan Note (Signed)
Chronic with worsening symptoms within the last month She did have x-rays at last visit that do show some degenerative arthritis We have discussed conservative management with rest, ice, compression, elevation Can use NSAIDs as needed Discussed home exercise program and quad strengthening She can come in for a steroid injection and exam later this week or next week if she desires

## 2018-06-01 NOTE — Assessment & Plan Note (Signed)
Well-controlled Continue Protonix Have discussed with the patient the possible consequences of long-term PPI use

## 2018-06-04 ENCOUNTER — Encounter: Payer: Self-pay | Admitting: Physician Assistant

## 2018-06-04 ENCOUNTER — Ambulatory Visit (INDEPENDENT_AMBULATORY_CARE_PROVIDER_SITE_OTHER): Payer: Medicare Other | Admitting: Physician Assistant

## 2018-06-04 VITALS — BP 139/88 | HR 66 | Temp 98.0°F | Resp 16 | Wt 162.0 lb

## 2018-06-04 DIAGNOSIS — M1711 Unilateral primary osteoarthritis, right knee: Secondary | ICD-10-CM

## 2018-06-04 MED ORDER — METHYLPREDNISOLONE ACETATE 40 MG/ML IJ SUSP
80.0000 mg | Freq: Once | INTRAMUSCULAR | Status: AC
  Administered 2018-06-04: 80 mg via INTRA_ARTICULAR

## 2018-06-04 NOTE — Progress Notes (Signed)
Patient: Marisa Sherman Female    DOB: 10-12-1960   58 y.o.   MRN: [id number] Visit Date: 06/04/2018  Today's Provider: Margaretann Loveless, PA-C   No chief complaint on file.  Subjective:     Knee Pain   The pain is present in the right knee, right thigh, right ankle, right heel, right hip and right leg. The quality of the pain is described as aching. The pain is at a severity of 7/10. The pain is moderate. The pain has been constant since onset. Associated symptoms include an inability to bear weight, a loss of motion and muscle weakness. Pertinent negatives include no loss of sensation, numbness or tingling. She reports no foreign bodies present. The symptoms are aggravated by weight bearing and movement. She has tried NSAIDs for the symptoms. The treatment provided no relief.   Patient states she has had right knee pain for years. Patient states pain is in her right knee and radiates up and down her leg. No numbness or tinling. Patient states pain and weakness in right leg and knee makes it hard for to walk. Patient has beem taking meloxicam for pain with no relief.   Allergies  Allergen Reactions  . Codeine Nausea Only  . Diphenhydramine Hcl Other (See Comments)    Pt unsure why  . Lithium Itching  . Sulfa Antibiotics Nausea And Vomiting  . Amoxicillin     N & V     Current Outpatient Medications:  .  allopurinol (ZYLOPRIM) 100 MG tablet, Take 1 tablet (100 mg total) by mouth daily., Disp: 90 tablet, Rfl: 3 .  aspirin EC 81 MG tablet, Take 81 mg by mouth daily., Disp: , Rfl:  .  atorvastatin (LIPITOR) 40 MG tablet, Take 1 tablet (40 mg total) by mouth daily at 6 PM., Disp: 90 tablet, Rfl: 3 .  FLUoxetine (PROZAC) 40 MG capsule, Take 1 capsule (40 mg total) by mouth every morning., Disp: 90 capsule, Rfl: 0 .  fluticasone (FLONASE) 50 MCG/ACT nasal spray, Place 2 sprays into both nostrils daily. (Patient not taking: Reported on 05/01/2017), Disp: 16 g, Rfl: 6 .  hydrOXYzine  (VISTARIL) 25 MG capsule, Take 1 capsule (25 mg total) by mouth every 8 (eight) hours as needed for anxiety., Disp: 180 capsule, Rfl: 3 .  lisinopril (ZESTRIL) 20 MG tablet, Take 1 tablet (20 mg total) by mouth daily., Disp: 90 tablet, Rfl: 3 .  meloxicam (MOBIC) 15 MG tablet, Take 1 tablet (15 mg total) by mouth daily., Disp: 90 tablet, Rfl: 0 .  ondansetron (ZOFRAN) 4 MG tablet, Take 1 tablet (4 mg total) by mouth every 8 (eight) hours as needed for nausea or vomiting. (Patient not taking: Reported on 05/01/2017), Disp: 20 tablet, Rfl: 0 .  oxybutynin (DITROPAN) 5 MG tablet, Take 1 tablet (5 mg total) by mouth 2 (two) times daily., Disp: 60 tablet, Rfl: 5 .  pantoprazole (PROTONIX) 40 MG tablet, Take 1 tablet (40 mg total) by mouth daily., Disp: 90 tablet, Rfl: 3 .  REXULTI 2 MG TABS, Take 2 mg by mouth at bedtime., Disp: 90 tablet, Rfl: 0 .  traZODone (DESYREL) 100 MG tablet, Take 2 tablets (200 mg total) by mouth at bedtime as needed for sleep (INSOMNIA)., Disp: 180 tablet, Rfl: 0  Review of Systems  Constitutional: Negative for appetite change, chills, fatigue and fever.  Respiratory: Negative for chest tightness and shortness of breath.   Cardiovascular: Negative for chest pain and palpitations.  Gastrointestinal:  Negative for abdominal pain, nausea and vomiting.  Musculoskeletal: Positive for arthralgias and joint swelling.  Neurological: Negative for dizziness, tingling, weakness and numbness.    Social History   Tobacco Use  . Smoking status: Current Every Day Smoker    Packs/day: 0.50    Years: 40.00    Pack years: 20.00    Types: Cigarettes  . Smokeless tobacco: Never Used  . Tobacco comment: started smoking at age 58; has quit on and off throughout the years. Has decreased cig use to 0.5 PPD  Substance Use Topics  . Alcohol use: Yes    Comment: 1-2 beers every 6 months      Objective:   There were no vitals taken for this visit. There were no vitals filed for this visit.    Physical Exam Vitals signs reviewed.  Constitutional:      General: She is not in acute distress.    Appearance: Normal appearance. She is well-developed. She is not ill-appearing or diaphoretic.  Neck:     Musculoskeletal: Normal range of motion and neck supple.     Thyroid: No thyromegaly.     Vascular: No JVD.     Trachea: No tracheal deviation.  Cardiovascular:     Rate and Rhythm: Normal rate and regular rhythm.     Heart sounds: Normal heart sounds. No murmur. No friction rub. No gallop.   Pulmonary:     Effort: Pulmonary effort is normal. No respiratory distress.     Breath sounds: Normal breath sounds. No wheezing or rales.  Musculoskeletal:     Right knee: She exhibits swelling. She exhibits normal range of motion, no effusion, no deformity, no laceration, normal alignment, no LCL laxity, normal patellar mobility, no bony tenderness, normal meniscus and no MCL laxity. Tenderness found. Medial joint line and lateral joint line tenderness noted.  Lymphadenopathy:     Cervical: No cervical adenopathy.  Neurological:     Mental Status: She is alert.        Assessment & Plan    1. Primary osteoarthritis of right knee Cortisone injection given today without issue, see procedure note below. Discussed future treatment with hyaluronic acid injections vs TKR. Patient would like to go ahead and also establish with orthopedics for consideration of starting the hyaluronic acid injections to buy time before surgeries. Referral placed as below.  - methylPREDNISolone acetate (DEPO-MEDROL) injection 80 mg - Ambulatory referral to Orthopedic Surgery  Procedure Note: Benefits, risks (including infection, tattooing, adipose dimpling, and tendon rupture) and alternatives were explained to the patient. All questions were sought and answered.  Patient agreed to continue and verbal consent was obtained.   An aspiration and steroid injection was performed on right knee using 4cc of 1% plain  Xyloocaine and 80 mg of depo-medrol. There was minimal bleeding. Hemostasis was intact. A dry dressing was applied. The procedure was well tolerated.     Margaretann LovelessJennifer M Kalesha Irving, PA-C  [facility]

## 2018-06-04 NOTE — Patient Instructions (Signed)
Knee Injection  A knee injection is a procedure to get medicine into your knee joint to relieve the pain, swelling, and stiffness of arthritis. Your health care provider uses a needle to inject medicine, which may also help to lubricate and cushion your knee joint. You may need more than one injection.  Tell a health care provider about:  Any allergies you have.  All medicines you are taking, including vitamins, herbs, eye drops, creams, and over-the-counter medicines.  Any problems you or family members have had with anesthetic medicines.  Any blood disorders you have.  Any surgeries you have had.  Any medical conditions you have.  Whether you are pregnant or may be pregnant.  What are the risks?  Generally, this is a safe procedure. However, problems may occur, including:  Infection.  Bleeding.  Symptoms that get worse.  Damage to the area around your knee.  Allergic reaction to any of the medicines.  Skin reactions from repeated injections.  What happens before the procedure?  Ask your health care provider about changing or stopping your regular medicines. This is especially important if you are taking diabetes medicines or blood thinners.  Plan to have someone take you home from the hospital or clinic.  What happens during the procedure?    You will sit or lie down in a position for your knee to be treated.  The skin over your kneecap will be cleaned with a germ-killing soap.  You will be given a medicine that numbs the area (local anesthetic). You may feel some stinging.  The medicine will be injected into your knee. The needle is carefully placed between your kneecap and your knee. The medicine is injected into the joint space.  The needle will be removed at the end of the procedure.  A bandage (dressing) may be placed over the injection site.  The procedure may vary among health care providers and hospitals.  What can I expect after the procedure?  Your blood pressure, heart rate, breathing rate, and blood  oxygen level will be monitored until you leave the hospital or clinic.  You may have to move your knee through its full range of motion. This helps to get all the medicine into your joint space.  You will be watched to make sure that you do not have a reaction to the injected medicine.  You may feel more pain, swelling, and warmth than you did before the injection. This reaction may last about 1-2 days.  Follow these instructions at home:  Medicines  Take over-the-counter and prescription medicines only as told by your doctor.  Do not drive or use heavy machinery while taking prescription pain medicine.  Do not take medicines such as aspirin and ibuprofen unless your health care provider tells you to take them.  Injection site care  Follow instructions from your health care provider about:  How to take care of your puncture site.  When and how you should change your dressing.  When you should remove your dressing.  Check your injection area every day for signs of infection. Check for:  More redness, swelling, or pain after 2 days.  Fluid or blood.  Pus or a bad smell.  Warmth.  Managing pain, stiffness, and swelling    If directed, put ice on the injection area:  Put ice in a plastic bag.  Place a towel between your skin and the bag.  Leave the ice on for 20 minutes, 2-3 times per day.  Do not   apply heat to your knee.  Raise (elevate) the injection area above the level of your heart while you are sitting or lying down.  General instructions  If you were given a dressing, keep it dry until your health care provider says it can be removed. Ask your health care provider when you can start showering or taking a bath.  Avoid strenuous activities for as long as directed by your health care provider. Ask your health care provider when you can return to your normal activities.  Keep all follow-up visits as told by your health care provider. This is important. You may need more injections.  Contact a health care provider if  you have:  A fever.  Warmth in your injection area.  Fluid, blood, or pus coming from your injection site.  Symptoms at your injection site that last longer than 2 days after your procedure.  Get help right away if:  Your knee:  Turns very red.  Becomes very swollen.  Is in severe pain.  Summary  A knee injection is a procedure to get medicine into your knee joint to relieve the pain, swelling, and stiffness of arthritis.  A needle is carefully placed between your kneecap and your knee to inject medicine into the joint space.  Before the procedure, ask your health care provider about changing or stopping your regular medicines, especially if you are taking diabetes medicines or blood thinners.  Contact your health care provider if you have any problems or questions after your procedure.  This information is not intended to replace advice given to you by your health care provider. Make sure you discuss any questions you have with your health care provider.  Document Released: 04/03/2006 Document Revised: 01/30/2017 Document Reviewed: 01/30/2017  Elsevier Interactive Patient Education  2019 [employer]

## 2018-06-19 DIAGNOSIS — M1711 Unilateral primary osteoarthritis, right knee: Secondary | ICD-10-CM | POA: Diagnosis not present

## 2018-08-10 ENCOUNTER — Other Ambulatory Visit (HOSPITAL_COMMUNITY)
Admission: RE | Admit: 2018-08-10 | Discharge: 2018-08-10 | Disposition: A | Discharge status: Home / Self Care | Payer: Medicare Other | Source: Ambulatory Visit | Attending: Family Medicine | Admitting: Family Medicine

## 2018-08-10 ENCOUNTER — Ambulatory Visit (INDEPENDENT_AMBULATORY_CARE_PROVIDER_SITE_OTHER): Payer: Medicare Other | Admitting: Family Medicine

## 2018-08-10 ENCOUNTER — Other Ambulatory Visit: Payer: Self-pay

## 2018-08-10 ENCOUNTER — Encounter: Payer: Self-pay | Admitting: Family Medicine

## 2018-08-10 DIAGNOSIS — R102 Pelvic and perineal pain: Secondary | ICD-10-CM | POA: Diagnosis not present

## 2018-08-10 DIAGNOSIS — N898 Other specified noninflammatory disorders of vagina: Secondary | ICD-10-CM | POA: Insufficient documentation

## 2018-08-10 DIAGNOSIS — N3000 Acute cystitis without hematuria: Secondary | ICD-10-CM

## 2018-08-10 DIAGNOSIS — Z202 Contact with and (suspected) exposure to infections with a predominantly sexual mode of transmission: Secondary | ICD-10-CM | POA: Diagnosis not present

## 2018-08-10 DIAGNOSIS — Z20828 Contact with and (suspected) exposure to other viral communicable diseases: Secondary | ICD-10-CM | POA: Diagnosis not present

## 2018-08-10 LAB — POCT URINALYSIS DIPSTICK
Appearance: ABNORMAL
Bilirubin, UA: NEGATIVE
Blood, UA: NEGATIVE
Glucose, UA: NEGATIVE
Ketones, UA: NEGATIVE
Nitrite, UA: NEGATIVE
Odor: ABNORMAL
Protein, UA: NEGATIVE
Spec Grav, UA: 1.01 (ref 1.010–1.025)
Urobilinogen, UA: 0.2 E.U./dL
pH, UA: 6 (ref 5.0–8.0)

## 2018-08-10 MED ORDER — CEPHALEXIN 500 MG PO CAPS: 500.0000 mg | ORAL_CAPSULE | Freq: Two times a day (BID) | ORAL | 0 refills | Status: AC

## 2018-08-10 NOTE — Patient Instructions (Signed)
Cervicitis  Cervicitis is when the cervix gets irritated and swollen. Your cervix is the lower end of your uterus. Follow these instructions at home:  Do not have sex until your doctor says it is okay.  Take over-the-counter and prescription medicines only as told by your doctor.  If you were prescribed an antibiotic medicine, take it as told by your doctor. Do not stop taking it even if you start to feel better.  Keep all follow-up visits as told by your doctor. This is important. Contact a doctor if:  Your symptoms come back after treatment.  Your symptoms get worse after treatment.  You have a fever.  You feel tired (fatigued).  Your belly (abdomen) hurts.  You feel like you are going to throw up (are nauseous).  You throw up (vomit).  You have watery poop (diarrhea).  Your back hurts. Get help right away if:  You have very bad pain in your belly, and medicine does not help it.  You cannot pee (urinate). Summary  Cervicitis is when the cervix gets irritated and swollen.  Do not have sex until your doctor says it is okay.  If you need to take an antibiotic, do not stop taking even if you start to feel better. Take medicines only as told by your doctor. This information is not intended to replace advice given to you by your health care provider. Make sure you discuss any questions you have with your health care provider. Document Released: 10/20/2007 Document Revised: 12/23/2016 Document Reviewed: 09/27/2015 Elsevier Patient Education  2020 Elsevier Inc.  

## 2018-08-10 NOTE — Progress Notes (Signed)
Patient: Marisa Sherman Female    DOB: 10-30-1960   58 y.o.   MRN: [id number] Visit Date: 08/10/2018  Today's Provider: Lavon Paganini, MD   Chief Complaint  Patient presents with  . Vaginal Discharge   Subjective:     Vaginal Discharge The patient's primary symptoms include a genital odor, pelvic pain and vaginal discharge. This is a new problem. The current episode started in the past 7 days. The problem occurs constantly. Pertinent negatives include no abdominal pain, anorexia, back pain, chills, constipation, diarrhea, discolored urine, dysuria, fever, flank pain, frequency, headaches, hematuria, joint pain, joint swelling, nausea, painful intercourse, rash, sore throat, urgency or vomiting. The vaginal discharge was mucoid, brown, clear and watery. There has been no bleeding.   Patient has had vaginal discharge, pelvic pain, and odor for 1 week. Patient states she would like to be tested for STD's.  She has 2 males partners in last 6 months.  New partner recently. No known STDs.    Allergies  Allergen Reactions  . Codeine Nausea Only  . Diphenhydramine Hcl Other (See Comments)    Pt unsure why  . Lithium Itching  . Sulfa Antibiotics Nausea And Vomiting  . Amoxicillin     N & V     Current Outpatient Medications:  .  allopurinol (ZYLOPRIM) 100 MG tablet, Take 1 tablet (100 mg total) by mouth daily., Disp: 90 tablet, Rfl: 3 .  aspirin EC 81 MG tablet, Take 81 mg by mouth daily., Disp: , Rfl:  .  atorvastatin (LIPITOR) 40 MG tablet, Take 1 tablet (40 mg total) by mouth daily at 6 PM., Disp: 90 tablet, Rfl: 3 .  FLUoxetine (PROZAC) 40 MG capsule, Take 1 capsule (40 mg total) by mouth every morning., Disp: 90 capsule, Rfl: 0 .  hydrOXYzine (VISTARIL) 25 MG capsule, Take 1 capsule (25 mg total) by mouth every 8 (eight) hours as needed for anxiety., Disp: 180 capsule, Rfl: 3 .  lisinopril (ZESTRIL) 20 MG tablet, Take 1 tablet (20 mg total) by mouth daily., Disp: 90 tablet,  Rfl: 3 .  meloxicam (MOBIC) 15 MG tablet, Take 1 tablet (15 mg total) by mouth daily., Disp: 90 tablet, Rfl: 0 .  oxybutynin (DITROPAN) 5 MG tablet, Take 1 tablet (5 mg total) by mouth 2 (two) times daily., Disp: 60 tablet, Rfl: 5 .  pantoprazole (PROTONIX) 40 MG tablet, Take 1 tablet (40 mg total) by mouth daily., Disp: 90 tablet, Rfl: 3 .  REXULTI 2 MG TABS, Take 2 mg by mouth at bedtime., Disp: 90 tablet, Rfl: 0 .  traZODone (DESYREL) 100 MG tablet, Take 2 tablets (200 mg total) by mouth at bedtime as needed for sleep (INSOMNIA)., Disp: 180 tablet, Rfl: 0 .  cephALEXin (KEFLEX) 500 MG capsule, Take 1 capsule (500 mg total) by mouth 2 (two) times daily for 5 days., Disp: 10 capsule, Rfl: 0 .  fluticasone (FLONASE) 50 MCG/ACT nasal spray, Place 2 sprays into both nostrils daily. (Patient not taking: Reported on 06/04/2018), Disp: 16 g, Rfl: 6 .  ondansetron (ZOFRAN) 4 MG tablet, Take 1 tablet (4 mg total) by mouth every 8 (eight) hours as needed for nausea or vomiting. (Patient not taking: Reported on 06/04/2018), Disp: 20 tablet, Rfl: 0  Review of Systems  Constitutional: Negative for appetite change, chills, fatigue and fever.  HENT: Negative for sore throat.   Respiratory: Negative for chest tightness and shortness of breath.   Cardiovascular: Negative for chest pain and palpitations.  Gastrointestinal: Negative for abdominal pain, anorexia, constipation, diarrhea, nausea and vomiting.  Genitourinary: Positive for pelvic pain and vaginal discharge. Negative for dysuria, flank pain, frequency, hematuria and urgency.  Musculoskeletal: Negative for back pain and joint pain.  Skin: Negative for rash.  Neurological: Negative for dizziness, weakness and headaches.    Social History   Tobacco Use  . Smoking status: Current Every Day Smoker    Packs/day: 0.50    Years: 40.00    Pack years: 20.00    Types: Cigarettes  . Smokeless tobacco: Never Used  . Tobacco comment: started smoking at age  89; has quit on and off throughout the years. Has decreased cig use to 0.5 PPD  Substance Use Topics  . Alcohol use: Yes    Comment: 1-2 beers every 6 months      Objective:   There were no vitals taken for this visit. There were no vitals filed for this visit.   Physical Exam Vitals signs reviewed.  Constitutional:      General: She is not in acute distress.    Appearance: Normal appearance.  HENT:     Head: Normocephalic and atraumatic.  Eyes:     General: No scleral icterus.    Conjunctiva/sclera: Conjunctivae normal.  Cardiovascular:     Rate and Rhythm: Normal rate and regular rhythm.     Pulses: Normal pulses.     Heart sounds: Normal heart sounds. No murmur.  Pulmonary:     Effort: Pulmonary effort is normal. No respiratory distress.     Breath sounds: Normal breath sounds. No wheezing or rhonchi.  Abdominal:     General: There is no distension.     Palpations: Abdomen is soft.     Tenderness: There is abdominal tenderness (mild, suprapubic).  Genitourinary:    Comments: GYN:  External genitalia within normal limits.  Vaginal mucosa pink, moist, normal rugae.  Nonfriable cervix without lesions, no bleeding, + green discharge noted on speculum exam.  Bimanual exam revealed normal, nongravid uterus.  No cervical motion tenderness. No adnexal masses bilaterally.    Skin:    General: Skin is warm and dry.     Findings: No rash.  Neurological:     Mental Status: She is alert and oriented to person, place, and time. Mental status is at baseline.  Psychiatric:        Mood and Affect: Mood normal.        Behavior: Behavior normal.      Results for orders placed or performed in visit on 08/10/18  POCT urinalysis dipstick  Result Value Ref Range   Color, UA Dark Yellow    Clarity, UA Cloudy    Glucose, UA Negative Negative   Bilirubin, UA Neg    Ketones, UA Neg    Spec Grav, UA 1.010 1.010 - 1.025   Blood, UA Neg    pH, UA 6.0 5.0 - 8.0   Protein, UA Negative  Negative   Urobilinogen, UA 0.2 0.2 or 1.0 E.U./dL   Nitrite, UA Neg    Leukocytes, UA Moderate (2+) (A) Negative   Appearance Abnormal    Odor Abnormal        Assessment & Plan   1. Acute cystitis without hematuria -  UA consistent with UTI -No systemic symptoms or signs of pyelonephritis - will start treatment with 5 day course of Keflex  -We will send urine culture to confirm sensitivities -Discussed return precautions  - POCT urinalysis dipstick - CULTURE, URINE COMPREHENSIVE  2. Vaginal discharge 3. Pelvic pain -New problem - Given that she has a new sexual partner and discharge is noted on exam, concerning for possible STD - Will not start empiric treatment, but will send testing for gonorrhea, chlamydia, trichomonas, BV, yeast -As below, will also check for HIV and syphilis and serum - Discussed safe sex practices -Discussed avoiding intercourse until results available and then partner will need to be tested and treated as well - Cervicovaginal ancillary only  4. Contact with or exposure to viral disease - HIV antibody (with reflex)  5. Venereal disease contact - RPR    Meds ordered this encounter  Medications  . cephALEXin (KEFLEX) 500 MG capsule    Sig: Take 1 capsule (500 mg total) by mouth 2 (two) times daily for 5 days.    Dispense:  10 capsule    Refill:  0     Return if symptoms worsen or fail to improve.   The entirety of the information documented in the History of Present Illness, Review of Systems and Physical Exam were personally obtained by me. Portions of this information were initially documented by April Miller, CMA and reviewed by me for thoroughness and accuracy.    , Marzella SchleinAngela M, MD MPH [facility]

## 2018-08-11 LAB — HIV ANTIBODY (ROUTINE TESTING W REFLEX): HIV Screen 4th Generation wRfx: NONREACTIVE

## 2018-08-11 LAB — RPR: RPR Ser Ql: NONREACTIVE

## 2018-08-13 LAB — CERVICOVAGINAL ANCILLARY ONLY
Bacterial vaginitis: POSITIVE — AB
Candida vaginitis: NEGATIVE
Chlamydia: NEGATIVE
Neisseria Gonorrhea: NEGATIVE
Trichomonas: POSITIVE — AB

## 2018-08-13 LAB — CULTURE, URINE COMPREHENSIVE

## 2018-08-14 ENCOUNTER — Telehealth: Payer: Self-pay | Admitting: Family Medicine

## 2018-08-14 DIAGNOSIS — A599 Trichomoniasis, unspecified: Secondary | ICD-10-CM

## 2018-08-14 DIAGNOSIS — N760 Acute vaginitis: Secondary | ICD-10-CM

## 2018-08-14 DIAGNOSIS — B9689 Other specified bacterial agents as the cause of diseases classified elsewhere: Secondary | ICD-10-CM

## 2018-08-14 NOTE — Telephone Encounter (Signed)
Pt calling for lab results.  Pt hs some questions as well.  Thanks, [employer]

## 2018-08-15 ENCOUNTER — Other Ambulatory Visit: Payer: Self-pay

## 2018-08-15 NOTE — Telephone Encounter (Signed)
LVMTRC 

## 2018-08-15 NOTE — Telephone Encounter (Signed)
-----   Message from Virginia Crews, MD sent at 08/14/2018  9:49 AM EDT ----- No UTI. White blood cells were from vaginal discharge. Ok to stop those abx.  Negative for STDs, other than Trichomonas.  Also positive for BV, which is not an STD.  Both of these things are treated that same. Ok to eRx Metronidazole 500mg  BID x7d, #14 r0.  Needs to have partner tested and treated. No sexcual intercourse until both have completed abx course.

## 2018-08-16 MED ORDER — METRONIDAZOLE 500 MG PO TABS: 500.0000 mg | ORAL_TABLET | Freq: Two times a day (BID) | ORAL | 0 refills | Status: DC

## 2018-08-16 NOTE — Telephone Encounter (Signed)
Patient was advised of labs and medication send into pharmacy.

## 2018-08-16 NOTE — Telephone Encounter (Signed)
Patient was advised and medication send in.

## 2018-08-22 ENCOUNTER — Other Ambulatory Visit: Payer: Self-pay | Admitting: Family Medicine

## 2018-08-22 NOTE — Telephone Encounter (Signed)
L.O.V. 08/10/2018 and upcoming appointment is 08/30/2018.

## 2018-08-30 ENCOUNTER — Ambulatory Visit: Payer: Medicare Other | Admitting: Family Medicine

## 2018-08-30 NOTE — Progress Notes (Deleted)
Patient: Marisa Sherman Female    DOB: 1960/12/20   58 y.o.   MRN: [id number] Visit Date: 08/30/2018  Today's Provider: Lavon Paganini, MD   No chief complaint on file.  Subjective:    HPI  Hypertension, follow-up:  BP Readings from Last 3 Encounters:  06/04/18 139/88  02/07/18 123/85  09/30/17 (!) 151/104    She was last seen for hypertension 3 months ago.  BP at that visit was ***. Management changes since that visit include ***. She reports {excellent/good/fair/poor:19665} compliance with treatment. She {ACTION; IS/IS ELF:[id number]} having side effects. *** She {is/is not:9024} exercising. She {is/is not:9024} adherent to low salt diet.   Outside blood pressures are ***. She is experiencing {Symptoms; cardiac:12860}.  Patient denies {Symptoms; cardiac:12860}.   Cardiovascular risk factors include {cv risk factors:510}.  Use of agents associated with hypertension: {bp agents assoc with hypertension:511::"none"}.     Weight trend: {trend:16658} Wt Readings from Last 3 Encounters:  06/04/18 162 lb (73.5 kg)  02/07/18 162 lb 9.6 oz (73.8 kg)  09/30/17 171 lb (77.6 kg)    Current diet: {diet habits:16563}  ------------------------------------------------------------------------  Lipid/Cholesterol, Follow-up:   Last seen for this3 months ago.  Management changes since that visit include ***. Marland Kitchen Last Lipid Panel: No results found for: CHOL, TRIG, HDL, CHOLHDL, VLDL, LDLCALC, LDLDIRECT  Risk factors for vascular disease include {risk factors atherosclerosis:10337}  She reports {excellent/good/fair/poor:19665} compliance with treatment. She {ACTION; IS/IS CHE:[id number]} having side effects.  Current symptoms include {Symptoms; diabetes:14075} and have been {Desc; course:15616}. Weight trend: {trend:16658} Prior visit with dietician: {yes/no:17258} Current diet: {diet habits:16563} Current exercise: {exercise types:16438}  Wt Readings from Last 3  Encounters:  06/04/18 162 lb (73.5 kg)  02/07/18 162 lb 9.6 oz (73.8 kg)  09/30/17 171 lb (77.6 kg)    -------------------------------------------------------------------   Allergies  Allergen Reactions  . Codeine Nausea Only  . Diphenhydramine Hcl Other (See Comments)    Pt unsure why  . Lithium Itching  . Sulfa Antibiotics Nausea And Vomiting  . Amoxicillin     N & V     Current Outpatient Medications:  .  allopurinol (ZYLOPRIM) 100 MG tablet, Take 1 tablet (100 mg total) by mouth daily., Disp: 90 tablet, Rfl: 3 .  aspirin EC 81 MG tablet, Take 81 mg by mouth daily., Disp: , Rfl:  .  atorvastatin (LIPITOR) 40 MG tablet, Take 1 tablet (40 mg total) by mouth daily at 6 PM., Disp: 90 tablet, Rfl: 3 .  FLUoxetine (PROZAC) 40 MG capsule, TAKE 1 CAPSULE (40 MG TOTAL) BY MOUTH EVERY MORNING., Disp: 90 capsule, Rfl: 0 .  fluticasone (FLONASE) 50 MCG/ACT nasal spray, Place 2 sprays into both nostrils daily. (Patient not taking: Reported on 06/04/2018), Disp: 16 g, Rfl: 6 .  hydrOXYzine (VISTARIL) 25 MG capsule, Take 1 capsule (25 mg total) by mouth every 8 (eight) hours as needed for anxiety., Disp: 180 capsule, Rfl: 3 .  lisinopril (ZESTRIL) 20 MG tablet, Take 1 tablet (20 mg total) by mouth daily., Disp: 90 tablet, Rfl: 3 .  meloxicam (MOBIC) 15 MG tablet, TAKE 1 TABLET (15 MG TOTAL) BY MOUTH DAILY., Disp: 90 tablet, Rfl: 0 .  metroNIDAZOLE (FLAGYL) 500 MG tablet, Take 1 tablet (500 mg total) by mouth 2 (two) times daily., Disp: 14 tablet, Rfl: 0 .  ondansetron (ZOFRAN) 4 MG tablet, Take 1 tablet (4 mg total) by mouth every 8 (eight) hours as needed for nausea or vomiting. (Patient not taking: Reported on  06/04/2018), Disp: 20 tablet, Rfl: 0 .  oxybutynin (DITROPAN) 5 MG tablet, Take 1 tablet (5 mg total) by mouth 2 (two) times daily., Disp: 60 tablet, Rfl: 5 .  pantoprazole (PROTONIX) 40 MG tablet, Take 1 tablet (40 mg total) by mouth daily., Disp: 90 tablet, Rfl: 3 .  REXULTI 2 MG TABS,  TAKE 1 TABLET (2 MG) BY MOUTH AT BEDTIME., Disp: 90 tablet, Rfl: 0 .  traZODone (DESYREL) 100 MG tablet, TAKE 2 TABLETS BY MOUTH AT BEDTIME AS NEEDED SLEEP, Disp: 180 tablet, Rfl: 0  Review of Systems  Constitutional: Negative.   Respiratory: Negative.   Cardiovascular: Negative.   Genitourinary: Negative.   Hematological: Negative.     Social History   Tobacco Use  . Smoking status: Current Every Day Smoker    Packs/day: 0.50    Years: 40.00    Pack years: 20.00    Types: Cigarettes  . Smokeless tobacco: Never Used  . Tobacco comment: started smoking at age 58; has quit on and off throughout the years. Has decreased cig use to 0.5 PPD  Substance Use Topics  . Alcohol use: Yes    Comment: 1-2 beers every 6 months      Objective:   There were no vitals taken for this visit. There were no vitals filed for this visit.   Physical Exam   No results found for any visits on 08/30/18.     Assessment & Plan        Shirlee LatchAngela Bacigalupo, MD  [facility]

## 2018-10-04 ENCOUNTER — Telehealth: Payer: Self-pay

## 2018-10-04 NOTE — Telephone Encounter (Signed)
Tried to call pt to reschedule previously cancelled AWV due to [place]. There was NANM. Will try again later and offer telephonic visit.

## 2018-10-23 ENCOUNTER — Telehealth: Payer: Self-pay | Admitting: Family Medicine

## 2018-10-23 DIAGNOSIS — N760 Acute vaginitis: Secondary | ICD-10-CM

## 2018-10-23 DIAGNOSIS — A599 Trichomoniasis, unspecified: Secondary | ICD-10-CM

## 2018-10-23 DIAGNOSIS — B9689 Other specified bacterial agents as the cause of diseases classified elsewhere: Secondary | ICD-10-CM

## 2018-10-23 NOTE — Telephone Encounter (Signed)
Pt has had a recurrence of the STD she had a couple weeks  ago.  She said she did not take the medication as she was suppose to.  She missed doses.   CB#  [phone]  teri

## 2018-10-24 MED ORDER — METRONIDAZOLE 500 MG PO TABS: 500.0000 mg | ORAL_TABLET | Freq: Two times a day (BID) | ORAL | 0 refills | Status: DC

## 2018-10-24 NOTE — Telephone Encounter (Signed)
Was treated for Trichomonas.  Will repeat Metronidazole BID x7 days.  Needs to avoid any sexual intercourse until she and her partner have completed treatment.  Do not drink alcohol with this medication.

## 2018-10-24 NOTE — Telephone Encounter (Signed)
[id number] 

## 2018-10-25 NOTE — Telephone Encounter (Signed)
Patient advised.

## 2018-11-01 NOTE — Telephone Encounter (Signed)
No CB from pt to schedule an AWV. Closing encounter.

## 2018-11-15 ENCOUNTER — Other Ambulatory Visit: Payer: Self-pay | Admitting: Family Medicine

## 2018-11-23 ENCOUNTER — Other Ambulatory Visit: Payer: Self-pay | Admitting: Family Medicine

## 2018-11-23 DIAGNOSIS — Z1231 Encounter for screening mammogram for malignant neoplasm of breast: Secondary | ICD-10-CM

## 2019-01-14 ENCOUNTER — Ambulatory Visit (INDEPENDENT_AMBULATORY_CARE_PROVIDER_SITE_OTHER): Payer: Medicare Other | Admitting: Family Medicine

## 2019-01-14 ENCOUNTER — Encounter: Payer: Self-pay | Admitting: Family Medicine

## 2019-01-14 DIAGNOSIS — R519 Headache, unspecified: Secondary | ICD-10-CM

## 2019-01-14 DIAGNOSIS — M791 Myalgia, unspecified site: Secondary | ICD-10-CM

## 2019-01-14 DIAGNOSIS — R430 Anosmia: Secondary | ICD-10-CM | POA: Diagnosis not present

## 2019-01-14 DIAGNOSIS — R0981 Nasal congestion: Secondary | ICD-10-CM | POA: Diagnosis not present

## 2019-01-14 MED ORDER — AMOXICILLIN 500 MG PO CAPS: 1000.0000 mg | ORAL_CAPSULE | Freq: Two times a day (BID) | ORAL | 0 refills | Status: AC

## 2019-01-14 NOTE — Progress Notes (Signed)
Patient: Marisa Sherman Female    DOB: [birth year]   58 y.o.   MRN: [id number] Visit Date: 01/14/2019  Today's Provider: Mila Merry, MD   Chief Complaint  Patient presents with  . URI   Subjective:    Virtual Visit via Telephone Note  I connected with Marisa Sherman on 01/14/19 at  3:20 PM EST by telephone and verified that I am speaking with the correct person using two identifiers.  Location: Patient: home Provider: [facility]   I discussed the limitations, risks, security and privacy concerns of performing an evaluation and management service by telephone and the availability of in person appointments. I also discussed with the patient that there may be a patient responsible charge related to this service. The patient expressed understanding and agreed to proceed.  URI  Associated symptoms include coughing, headaches and sinus pain. Associated symptoms comments: Loss of taste and smell.  Not improving at all. Similar to previous symptoms associated with sinus infections. Mild cough. No fever. Some aching in body.   Allergies  Allergen Reactions  . Codeine Nausea Only  . Diphenhydramine Hcl Other (See Comments)    Pt unsure why  . Lithium Itching  . Sulfa Antibiotics Nausea And Vomiting  . Amoxicillin     N & V     Current Outpatient Medications:  .  allopurinol (ZYLOPRIM) 100 MG tablet, Take 1 tablet (100 mg total) by mouth daily., Disp: 90 tablet, Rfl: 3 .  aspirin EC 81 MG tablet, Take 81 mg by mouth daily., Disp: , Rfl:  .  atorvastatin (LIPITOR) 40 MG tablet, Take 1 tablet (40 mg total) by mouth daily at 6 PM., Disp: 90 tablet, Rfl: 3 .  FLUoxetine (PROZAC) 40 MG capsule, TAKE 1 CAPSULE (40 MG TOTAL) BY MOUTH EVERY MORNING., Disp: 90 capsule, Rfl: 0 .  hydrOXYzine (VISTARIL) 25 MG capsule, Take 1 capsule (25 mg total) by mouth every 8 (eight) hours as needed for anxiety., Disp: 180 capsule, Rfl: 3 .  lisinopril (ZESTRIL) 20 MG tablet, Take 1 tablet (20 mg total) by  mouth daily., Disp: 90 tablet, Rfl: 3 .  meloxicam (MOBIC) 15 MG tablet, TAKE 1 TABLET (15 MG TOTAL) BY MOUTH DAILY., Disp: 90 tablet, Rfl: 0 .  oxybutynin (DITROPAN) 5 MG tablet, Take 1 tablet (5 mg total) by mouth 2 (two) times daily., Disp: 60 tablet, Rfl: 5 .  pantoprazole (PROTONIX) 40 MG tablet, Take 1 tablet (40 mg total) by mouth daily., Disp: 90 tablet, Rfl: 3 .  REXULTI 2 MG TABS tablet, TAKE 1 TABLET (2 MG) BY MOUTH AT BEDTIME., Disp: 90 tablet, Rfl: 0 .  traZODone (DESYREL) 100 MG tablet, TAKE 2 TABLETS BY MOUTH AT BEDTIME AS NEEDED SLEEP, Disp: 180 tablet, Rfl: 0 .  fluticasone (FLONASE) 50 MCG/ACT nasal spray, Place 2 sprays into both nostrils daily. (Patient not taking: Reported on 06/04/2018), Disp: 16 g, Rfl: 6  Review of Systems  HENT: Positive for sinus pain.   Respiratory: Positive for cough.   Cardiovascular: Negative.   Musculoskeletal: Negative.   Neurological: Positive for headaches.    Social History   Tobacco Use  . Smoking status: Current Every Day Smoker    Packs/day: 0.50    Years: 40.00    Pack years: 20.00    Types: Cigarettes  . Smokeless tobacco: Never Used  . Tobacco comment: started smoking at age 28; has quit on and off throughout the years. Has decreased cig use to 0.5 PPD  Substance Use Topics  . Alcohol use: Yes    Comment: 1-2 beers every 6 months      Objective:   There were no vitals taken for this visit. There were no vitals filed for this visit.There is no height or weight on file to calculate BMI.   Physical Exam  Awake, alert, oriented x 3 in no distress.       Assessment & Plan      1. Sinus congestion Some of her sx are consistent with prior sinus infections. Will cover with- amoxicillin (AMOXIL) 500 MG capsule; Take 2 capsules (1,000 mg total) by mouth 2 (two) times daily for 10 days.  Dispense: 40 capsule; Refill: 0  2. Nonintractable headache, unspecified chronicity pattern, unspecified headache type   3.  Myalgia   4. Anosmia She does have several sx consistent with Covid-19. She was given instruction on covid testing through [facility]    The entirety of the information documented in the History of Present Illness, Review of Systems and Physical Exam were personally obtained by me. Portions of this information were initially documented by Idelle Jo, CMA and reviewed by me for thoroughness and accuracy.   I discussed the assessment and treatment plan with the patient. The patient was provided an opportunity to ask questions and all were answered. The patient agreed with the plan and demonstrated an understanding of the instructions.   The patient was advised to call back or seek an in-person evaluation if the symptoms worsen or if the condition fails to improve as anticipated.  I provided 10 minutes of non-face-to-face time during this encounter.     Lelon Huh, MD  [facility]

## 2019-01-16 ENCOUNTER — Ambulatory Visit: Payer: Medicare Other | Attending: Internal Medicine

## 2019-01-16 DIAGNOSIS — Z20822 Contact with and (suspected) exposure to covid-19: Secondary | ICD-10-CM

## 2019-01-18 LAB — NOVEL CORONAVIRUS, NAA: SARS-CoV-2, NAA: NOT DETECTED

## 2019-01-21 ENCOUNTER — Telehealth: Payer: Self-pay | Admitting: Family Medicine

## 2019-01-21 NOTE — Telephone Encounter (Signed)
Patient is calling to receive her negative COVID test. Patient expressed understanding. °

## 2019-02-12 ENCOUNTER — Other Ambulatory Visit: Payer: Self-pay | Admitting: Family Medicine

## 2019-02-12 NOTE — Telephone Encounter (Signed)
Requested medication (s) are due for refill today: rexulti, yes  Requested medication (s) are on the active medication list: no  Last refill:  11/19/2018  Future visit scheduled: no  Notes to clinic:  no assigned protocol    Requested Prescriptions  Pending Prescriptions Disp Refills   FLUoxetine (PROZAC) 40 MG capsule [Pharmacy Med Name: FLUoxetine HCl 40MG  CAPS] 90 capsule 0    Sig: TAKE 1 CAPSULE (40 MG TOTAL) BY MOUTH EVERY MORNING.      Psychiatry:  Antidepressants - SSRI Failed - 02/12/2019  2:54 PM      Failed - Valid encounter within last 6 months    Recent Outpatient Visits           4 weeks ago Sinus congestion   [facility] Birdie Sons, MD   6 months ago Acute cystitis without hematuria   [facility], Dionne Bucy, MD   8 months ago Primary osteoarthritis of right knee   [facility] Fenton Malling M, [place]   8 months ago Essential hypertension   [place], Dionne Bucy, MD   1 year ago Chronic pain of right knee   [facility] Bacigalupo, Dionne Bucy, MD                REXULTI 2 MG TABS tablet [Pharmacy Med Name: Rexulti 2MG  TABS] 90 tablet 0    Sig: TAKE 1 TABLET (2 MG) BY MOUTH AT BEDTIME.      Off-Protocol Failed - 02/12/2019  2:54 PM      Failed - Medication not assigned to a protocol, review manually.      Passed - Valid encounter within last 12 months    Recent Outpatient Visits           4 weeks ago Sinus congestion   [facility] Birdie Sons, MD   6 months ago Acute cystitis without hematuria   [facility], Dionne Bucy, MD   8 months ago Primary osteoarthritis of right knee   [facility] Fenton Malling M, [place]   8 months ago Essential hypertension   [facility] [place], Dionne Bucy, MD   1 year ago Chronic pain of right knee   [facility] [place], Dionne Bucy, MD                traZODone (DESYREL) 100 MG tablet [Pharmacy Med Name: traZODone HCl 100MG  TABS] 180 tablet 0    Sig: TAKE 2 TABLETS BY MOUTH AT BEDTIME AS NEEDED SLEEP      Psychiatry: Antidepressants - Serotonin Modulator Failed - 02/12/2019  2:54 PM      Failed - Valid encounter within last 6 months    Recent Outpatient Visits           4 weeks ago Sinus congestion   [facility] Birdie Sons, MD   6 months ago Acute cystitis without hematuria   [facility], Dionne Bucy, MD   8 months ago Primary osteoarthritis of right knee   [facility] Fenton Malling M, [place]   8 months ago Essential hypertension   [facility] [place], Dionne Bucy, MD   1 year ago Chronic pain of right knee   [name], Dionne Bucy, MD               Signed Prescriptions Disp Refills   meloxicam (MOBIC) 15 MG tablet 90 tablet 0  Sig: TAKE 1 TABLET (15 MG TOTAL) BY MOUTH DAILY.      Analgesics:  COX2 Inhibitors Failed - 02/12/2019  2:54 PM      Failed - HGB in normal range and within 360 days    Hemoglobin  Date Value Ref Range Status  04/07/2015 14.5 11.1 - 15.9 g/dL Final          Failed - Cr in normal range and within 360 days    Creatinine, Ser  Date Value Ref Range Status  04/07/2015 0.83 0.57 - 1.00 mg/dL Final          Passed - Patient is not pregnant      Passed - Valid encounter within last 12 months    Recent Outpatient Visits           4 weeks ago Sinus congestion   [facility] Malva Limes, MD   6 months ago Acute cystitis without hematuria   [facility], Marzella Schlein, MD   8 months ago Primary osteoarthritis of right knee   [facility] [place], [place], [place]   8 months ago Essential hypertension   [facility] [place], Marzella Schlein, MD   1 year ago Chronic pain of right knee   [facility] [place], Marzella Schlein, MD

## 2019-02-12 NOTE — Telephone Encounter (Signed)
Requested Prescriptions  Pending Prescriptions Disp Refills  . meloxicam (MOBIC) 15 MG tablet [Pharmacy Med Name: Meloxicam 15MG  TABS] 90 tablet 0    Sig: TAKE 1 TABLET (15 MG TOTAL) BY MOUTH DAILY.     Analgesics:  COX2 Inhibitors Failed - 02/12/2019  2:54 PM      Failed - HGB in normal range and within 360 days    Hemoglobin  Date Value Ref Range Status  04/07/2015 14.5 11.1 - 15.9 g/dL Final         Failed - Cr in normal range and within 360 days    Creatinine, Ser  Date Value Ref Range Status  04/07/2015 0.83 0.57 - 1.00 mg/dL Final         Passed - Patient is not pregnant      Passed - Valid encounter within last 12 months    Recent Outpatient Visits          4 weeks ago Sinus congestion   [facility], MD   6 months ago Acute cystitis without hematuria   [facility], MD   8 months ago Primary osteoarthritis of right knee   [facility] [place], [place], [place]   8 months ago Essential hypertension   [facility] [place], [place], [place]   1 year ago Chronic pain of right knee   [facility] [place], [place], MD             . FLUoxetine (PROZAC) 40 MG capsule [Pharmacy Med Name: FLUoxetine HCl 40MG  CAPS] 90 capsule 0    Sig: TAKE 1 CAPSULE (40 MG TOTAL) BY MOUTH EVERY MORNING.     Psychiatry:  Antidepressants - SSRI Failed - 02/12/2019  2:54 PM      Failed - Valid encounter within last 6 months    Recent Outpatient Visits          4 weeks ago Sinus congestion   [facility] 02/14/2019, MD   6 months ago Acute cystitis without hematuria   [facility], Malva Limes, MD   8 months ago Primary osteoarthritis of right knee   [facility] Marzella Schlein M, [place]   8 months ago Essential hypertension   [place], [facility], MD   1 year ago Chronic pain of right knee    [facility] Bacigalupo, Marzella Schlein, MD             . REXULTI 2 MG TABS tablet [Pharmacy Med Name: Rexulti 2MG  TABS] 90 tablet 0    Sig: TAKE 1 TABLET (2 MG) BY MOUTH AT BEDTIME.     Off-Protocol Failed - 02/12/2019  2:54 PM      Failed - Medication not assigned to a protocol, review manually.      Passed - Valid encounter within last 12 months    Recent Outpatient Visits          4 weeks ago Sinus congestion   [facility] , MD   6 months ago Acute cystitis without hematuria   [facility], [facility], MD   8 months ago Primary osteoarthritis of right knee   [facility], [facility]   8 months ago Essential hypertension   [facility] [place], [place], [place]   1 year ago Chronic pain of right knee   [facility], [place], MD             .  traZODone (DESYREL) 100 MG tablet [Pharmacy Med Name: traZODone HCl 100MG  TABS] 180 tablet 0    Sig: TAKE 2 TABLETS BY MOUTH AT BEDTIME AS NEEDED SLEEP     Psychiatry: Antidepressants - Serotonin Modulator Failed - 02/12/2019  2:54 PM      Failed - Valid encounter within last 6 months    Recent Outpatient Visits          4 weeks ago Sinus congestion   [place], [place]   6 months ago Acute cystitis without hematuria   [facility], Dionne Bucy, MD   8 months ago Primary osteoarthritis of right knee   [place], [place], [place]   8 months ago Essential hypertension   [place], Dionne Bucy, MD   1 year ago Chronic pain of right knee   [facility], Dionne Bucy, MD

## 2019-02-12 NOTE — Telephone Encounter (Signed)
Requested medication (s) are due for refill today: Fluoxetine, yes  Requested medication (s) are on the active medication list: yes  Last refill:  11/19/2018  Future visit scheduled: no  Notes to clinic:  no valid encounter in last 6 months  Requested medication (s) are due for refill today: Trazodone, yes  Requested medication (s) are on the active medication list: yes  Last refill:  11/19/2018  Future visit scheduled:no  Notes to clinic:  no valid encounter in last 6 months  Requested Prescriptions  Pending Prescriptions Disp Refills   meloxicam (MOBIC) 15 MG tablet [Pharmacy Med Name: Meloxicam 15MG  TABS] 90 tablet 0    Sig: TAKE 1 TABLET (15 MG TOTAL) BY MOUTH DAILY.      Analgesics:  COX2 Inhibitors Failed - 02/12/2019  2:54 PM      Failed - HGB in normal range and within 360 days    Hemoglobin  Date Value Ref Range Status  04/07/2015 14.5 11.1 - 15.9 g/dL Final          Failed - Cr in normal range and within 360 days    Creatinine, Ser  Date Value Ref Range Status  04/07/2015 0.83 0.57 - 1.00 mg/dL Final          Passed - Patient is not pregnant      Passed - Valid encounter within last 12 months    Recent Outpatient Visits           4 weeks ago Sinus congestion   [facility], MD   6 months ago Acute cystitis without hematuria   [facility], [facility], MD   8 months ago Primary osteoarthritis of right knee   [facility] [place], [place], [place]   8 months ago Essential hypertension   [facility] [place], [place], [place]   1 year ago Chronic pain of right knee   [facility] [place], [place], [place]                FLUoxetine (PROZAC) 40 MG capsule [Pharmacy Med Name: FLUoxetine HCl 40MG  CAPS] 90 capsule 0    Sig: TAKE 1 CAPSULE (40 MG TOTAL) BY MOUTH EVERY MORNING.      Psychiatry:  Antidepressants - SSRI Failed - 02/12/2019  2:54 PM     Failed - Valid encounter within last 6 months    Recent Outpatient Visits           4 weeks ago Sinus congestion   [facility] 02/14/2019, MD   6 months ago Acute cystitis without hematuria   [facility], Malva Limes, [place]   8 months ago Primary osteoarthritis of right knee   [facility] Marzella Schlein M, [place]   8 months ago Essential hypertension   [facility] [place], [facility], MD   1 year ago Chronic pain of right knee   [facility], Marzella Schlein, [place]                REXULTI 2 MG TABS tablet [Pharmacy Med Name: Rexulti 2MG  TABS] 90 tablet 0    Sig: TAKE 1 TABLET (2 MG) BY MOUTH AT BEDTIME.      Off-Protocol Failed - 02/12/2019  2:54 PM      Failed - Medication not assigned to a protocol, review manually.      Passed - Valid encounter within last 12 months    Recent Outpatient  Visits           4 weeks ago Sinus congestion   [place], [place]   6 months ago Acute cystitis without hematuria   [facility], Dionne Bucy, [place]   8 months ago Primary osteoarthritis of right knee   [facility] Fenton Malling M, [place]   8 months ago Essential hypertension   [facility], Dionne Bucy, MD   1 year ago Chronic pain of right knee   [facility] [place], Dionne Bucy, MD                traZODone (DESYREL) 100 MG tablet [Pharmacy Med Name: traZODone HCl 100MG  TABS] 180 tablet 0    Sig: TAKE 2 TABLETS BY MOUTH AT BEDTIME AS NEEDED SLEEP      Psychiatry: Antidepressants - Serotonin Modulator Failed - 02/12/2019  2:54 PM      Failed - Valid encounter within last 6 months    Recent Outpatient Visits           4 weeks ago Sinus congestion   [facility] Birdie Sons, MD   6 months ago Acute cystitis without hematuria   [facility],  Dionne Bucy, MD   8 months ago Primary osteoarthritis of right knee   [place], [place], [place]   8 months ago Essential hypertension   [facility] [place], Dionne Bucy, [place]   1 year ago Chronic pain of right knee   [facility], Dionne Bucy, MD

## 2019-02-13 NOTE — Telephone Encounter (Signed)
Refills approved, but patient is due for follow-up.

## 2019-03-26 ENCOUNTER — Telehealth (HOSPITAL_COMMUNITY): Payer: Self-pay | Admitting: Clinical

## 2019-03-26 ENCOUNTER — Ambulatory Visit (HOSPITAL_COMMUNITY): Payer: Medicare Other | Admitting: Clinical

## 2019-03-26 ENCOUNTER — Other Ambulatory Visit: Payer: Self-pay

## 2019-03-26 DIAGNOSIS — M1711 Unilateral primary osteoarthritis, right knee: Secondary | ICD-10-CM | POA: Diagnosis not present

## 2019-03-26 NOTE — Telephone Encounter (Signed)
The OPT therapist attempted text to session x2 @ 9:00AM and 9:10AM ,however, the patient failed to respond and missed their scheduled session.

## 2019-04-01 ENCOUNTER — Ambulatory Visit (INDEPENDENT_AMBULATORY_CARE_PROVIDER_SITE_OTHER): Payer: Medicare Other | Admitting: Family Medicine

## 2019-04-01 ENCOUNTER — Telehealth: Payer: Self-pay

## 2019-04-01 ENCOUNTER — Other Ambulatory Visit: Payer: Self-pay

## 2019-04-01 ENCOUNTER — Encounter: Payer: Self-pay | Admitting: Family Medicine

## 2019-04-01 VITALS — BP 121/85 | HR 69 | Temp 96.9°F | Wt 179.0 lb

## 2019-04-01 DIAGNOSIS — E782 Mixed hyperlipidemia: Secondary | ICD-10-CM | POA: Diagnosis not present

## 2019-04-01 DIAGNOSIS — Z720 Tobacco use: Secondary | ICD-10-CM | POA: Diagnosis not present

## 2019-04-01 DIAGNOSIS — Z1159 Encounter for screening for other viral diseases: Secondary | ICD-10-CM | POA: Diagnosis not present

## 2019-04-01 DIAGNOSIS — I10 Essential (primary) hypertension: Secondary | ICD-10-CM | POA: Diagnosis not present

## 2019-04-01 DIAGNOSIS — Z1231 Encounter for screening mammogram for malignant neoplasm of breast: Secondary | ICD-10-CM | POA: Diagnosis not present

## 2019-04-01 DIAGNOSIS — Z1211 Encounter for screening for malignant neoplasm of colon: Secondary | ICD-10-CM | POA: Diagnosis not present

## 2019-04-01 DIAGNOSIS — Z23 Encounter for immunization: Secondary | ICD-10-CM

## 2019-04-01 DIAGNOSIS — E785 Hyperlipidemia, unspecified: Secondary | ICD-10-CM | POA: Diagnosis not present

## 2019-04-01 DIAGNOSIS — M1A9XX0 Chronic gout, unspecified, without tophus (tophi): Secondary | ICD-10-CM | POA: Diagnosis not present

## 2019-04-01 MED ORDER — BUPROPION HCL ER (SR) 150 MG PO TB12: 150.0000 mg | ORAL_TABLET | Freq: Two times a day (BID) | ORAL | 3 refills | Status: DC

## 2019-04-01 NOTE — Assessment & Plan Note (Signed)
Asymptomatic with no recent flares Continue allopurinol Recheck uric acid level Avoid thiazide diuretics

## 2019-04-01 NOTE — Telephone Encounter (Signed)
Colonoscopy triage completed, however upon making her aware of COVID testing she hung up the phone.  Called her back, and it went to voicemail.  LVM asking her if she would like to schedule her colonoscopy to call office.  Thanks,  Marcelino Duster, [facility]  Gastroenterology Pre-Procedure Review  Request Date: Not Scheduled  Requesting Physician: Dr.   PATIENT REVIEW QUESTIONS: The patient responded to the following health history questions as indicated:    1. Are you having any GI issues? no 2. Do you have a personal history of Polyps? no 3. Do you have a family history of Colon Cancer or Polyps? no 4. Diabetes Mellitus? no 5. Joint replacements in the past 12 months?no 6. Major health problems in the past 3 months?no 7. Any artificial heart valves, MVP, or defibrillator?no    MEDICATIONS & ALLERGIES:    Patient reports the following regarding taking any anticoagulation/antiplatelet therapy:   Plavix, Coumadin, Eliquis, Xarelto, Lovenox, Pradaxa, Brilinta, or Effient? no Aspirin? no  Patient confirms/reports the following medications:  Current Outpatient Medications  Medication Sig Dispense Refill  . allopurinol (ZYLOPRIM) 100 MG tablet Take 1 tablet (100 mg total) by mouth daily. 90 tablet 3  . aspirin EC 81 MG tablet Take 81 mg by mouth daily.    Marland Kitchen atorvastatin (LIPITOR) 40 MG tablet Take 1 tablet (40 mg total) by mouth daily at 6 PM. 90 tablet 3  . buPROPion (WELLBUTRIN SR) 150 MG 12 hr tablet Take 1 tablet (150 mg total) by mouth 2 (two) times daily. 60 tablet 3  . FLUoxetine (PROZAC) 40 MG capsule TAKE 1 CAPSULE (40 MG TOTAL) BY MOUTH EVERY MORNING. 90 capsule 0  . hydrOXYzine (VISTARIL) 25 MG capsule Take 1 capsule (25 mg total) by mouth every 8 (eight) hours as needed for anxiety. 180 capsule 3  . lisinopril (ZESTRIL) 20 MG tablet Take 1 tablet (20 mg total) by mouth daily. 90 tablet 3  . meloxicam (MOBIC) 15 MG tablet TAKE 1 TABLET (15 MG TOTAL) BY MOUTH DAILY. 90 tablet 0  .  oxybutynin (DITROPAN) 5 MG tablet Take 1 tablet (5 mg total) by mouth 2 (two) times daily. 60 tablet 5  . pantoprazole (PROTONIX) 40 MG tablet Take 1 tablet (40 mg total) by mouth daily. 90 tablet 3  . REXULTI 2 MG TABS tablet TAKE 1 TABLET (2 MG) BY MOUTH AT BEDTIME. 90 tablet 0  . traZODone (DESYREL) 100 MG tablet TAKE 2 TABLETS BY MOUTH AT BEDTIME AS NEEDED SLEEP 180 tablet 0   No current facility-administered medications for this visit.    Patient confirms/reports the following allergies:  Allergies  Allergen Reactions  . Codeine Nausea Only  . Diphenhydramine Hcl Other (See Comments)    Pt unsure why  . Lithium Itching  . Sulfa Antibiotics Nausea And Vomiting  . Amoxicillin     N & V    No orders of the defined types were placed in this encounter.   AUTHORIZATION INFORMATION Primary Insurance: 1D#: Group #:  Secondary Insurance: 1D#: Group #:  SCHEDULE INFORMATION: Date: NOT SCHEDULED Time: Location:

## 2019-04-01 NOTE — Patient Instructions (Signed)
Coping with Quitting Smoking  Quitting smoking is a physical and mental challenge. You will face cravings, withdrawal symptoms, and temptation. Before quitting, work with your health care provider to make a plan that can help you cope. Preparation can help you quit and keep you from giving in. How can I cope with cravings? Cravings usually last for 5-10 minutes. If you get through it, the craving will pass. Consider taking the following actions to help you cope with cravings:  Keep your mouth busy: ? Chew sugar-free gum. ? Suck on hard candies or a straw. ? Brush your teeth.  Keep your hands and body busy: ? Immediately change to a different activity when you feel a craving. ? Squeeze or play with a ball. ? Do an activity or a hobby, like making bead jewelry, practicing needlepoint, or working with wood. ? Mix up your normal routine. ? Take a short exercise break. Go for a quick walk or run up and down stairs. ? Spend time in public places where smoking is not allowed.  Focus on doing something kind or helpful for someone else.  Call a friend or family member to talk during a craving.  Join a support group.  Call a quit line, such as 1-800-QUIT-NOW.  Talk with your health care provider about medicines that might help you cope with cravings and make quitting easier for you. How can I deal with withdrawal symptoms? Your body may experience negative effects as it tries to get used to not having nicotine in the system. These effects are called withdrawal symptoms. They may include:  Feeling hungrier than normal.  Trouble concentrating.  Irritability.  Trouble sleeping.  Feeling depressed.  Restlessness and agitation.  Craving a cigarette. To manage withdrawal symptoms:  Avoid places, people, and activities that trigger your cravings.  Remember why you want to quit.  Get plenty of sleep.  Avoid coffee and other caffeinated drinks. These may worsen some of your  symptoms. How can I handle social situations? Social situations can be difficult when you are quitting smoking, especially in the first few weeks. To manage this, you can:  Avoid parties, bars, and other social situations where people might be smoking.  Avoid alcohol.  Leave right away if you have the urge to smoke.  Explain to your family and friends that you are quitting smoking. Ask for understanding and support.  Plan activities with friends or family where smoking is not an option. What are some ways I can cope with stress? Wanting to smoke may cause stress, and stress can make you want to smoke. Find ways to manage your stress. Relaxation techniques can help. For example:  Breathe slowly and deeply, in through your nose and out through your mouth.  Listen to soothing, relaxing music.  Talk with a family member or friend about your stress.  Light a candle.  Soak in a bath or take a shower.  Think about a peaceful place. What are some ways I can prevent weight gain? Be aware that many people gain weight after they quit smoking. However, not everyone does. To keep from gaining weight, have a plan in place before you quit and stick to the plan after you quit. Your plan should include:  Having healthy snacks. When you have a craving, it may help to: ? Eat plain popcorn, crunchy carrots, celery, or other cut vegetables. ? Chew sugar-free gum.  Changing how you eat: ? Eat small portion sizes at meals. ? Eat 4-6 small meals   throughout the day instead of 1-2 large meals a day. ? Be mindful when you eat. Do not watch television or do other things that might distract you as you eat.  Exercising regularly: ? Make time to exercise each day. If you do not have time for a long workout, do short bouts of exercise for 5-10 minutes several times a day. ? Do some form of strengthening exercise, like weight lifting, and some form of aerobic exercise, like running or swimming.  Drinking  plenty of water or other low-calorie or no-calorie drinks. Drink 6-8 glasses of water daily, or as much as instructed by your health care provider. Summary  Quitting smoking is a physical and mental challenge. You will face cravings, withdrawal symptoms, and temptation to smoke again. Preparation can help you as you go through these challenges.  You can cope with cravings by keeping your mouth busy (such as by chewing gum), keeping your body and hands busy, and making calls to family, friends, or a helpline for people who want to quit smoking.  You can cope with withdrawal symptoms by avoiding places where people smoke, avoiding drinks with caffeine, and getting plenty of rest.  Ask your health care provider about the different ways to prevent weight gain, avoid stress, and handle social situations. This information is not intended to replace advice given to you by your health care provider. Make sure you discuss any questions you have with your health care provider. Document Revised: 12/23/2016 Document Reviewed: 01/08/2016 Elsevier Patient Education  2020 [employer]  

## 2019-04-01 NOTE — Assessment & Plan Note (Signed)
Continue Lipitor Recheck CMP and FLP 

## 2019-04-01 NOTE — Progress Notes (Signed)
Patient: Marisa Sherman Female    DOB: 1960/12/12   59 y.o.   MRN: [id number] Visit Date: 04/01/2019  Today's Provider: Shirlee Latch, MD   Chief Complaint  Patient presents with  . Hyperlipidemia  . Hypertension   Subjective:     Hyperlipidemia This is a chronic problem. Pertinent negatives include no chest pain, focal sensory loss, focal weakness, leg pain, myalgias or shortness of breath. Current antihyperlipidemic treatment includes statins. There are no compliance problems.      No results found for: CHOL No results found for: HDL No results found for: LDLCALC No results found for: TRIG No results found for: CHOLHDL No results found for: LDLDIRECT Wt Readings from Last 3 Encounters:  04/01/19 179 lb (81.2 kg)  06/04/18 162 lb (73.5 kg)  02/07/18 162 lb 9.6 oz (73.8 kg)      Hypertension, follow-up:  BP Readings from Last 3 Encounters:  04/01/19 121/85  06/04/18 139/88  02/07/18 123/85    She was last seen for hypertension 10 months ago.  Management since that visit includes Restart BP medicine. She reports excellent compliance with treatment. She is not having side effects.  She is exercising. She is adherent to low salt diet.   Outside blood pressures are not being checked. She is experiencing none.  Patient denies chest pain, dyspnea and palpitations.   Cardiovascular risk factors include hypertension.  Use of agents associated with hypertension: none.     Weight trend: stable Wt Readings from Last 3 Encounters:  04/01/19 179 lb (81.2 kg)  06/04/18 162 lb (73.5 kg)  02/07/18 162 lb 9.6 oz (73.8 kg)    Current diet: in general, a "healthy" diet    ------------------------------------------------------------------------   Allergies  Allergen Reactions  . Codeine Nausea Only  . Diphenhydramine Hcl Other (See Comments)    Pt unsure why  . Lithium Itching  . Sulfa Antibiotics Nausea And Vomiting  . Amoxicillin     N & V      Current Outpatient Medications:  .  allopurinol (ZYLOPRIM) 100 MG tablet, Take 1 tablet (100 mg total) by mouth daily., Disp: 90 tablet, Rfl: 3 .  aspirin EC 81 MG tablet, Take 81 mg by mouth daily., Disp: , Rfl:  .  atorvastatin (LIPITOR) 40 MG tablet, Take 1 tablet (40 mg total) by mouth daily at 6 PM., Disp: 90 tablet, Rfl: 3 .  FLUoxetine (PROZAC) 40 MG capsule, TAKE 1 CAPSULE (40 MG TOTAL) BY MOUTH EVERY MORNING., Disp: 90 capsule, Rfl: 0 .  hydrOXYzine (VISTARIL) 25 MG capsule, Take 1 capsule (25 mg total) by mouth every 8 (eight) hours as needed for anxiety., Disp: 180 capsule, Rfl: 3 .  lisinopril (ZESTRIL) 20 MG tablet, Take 1 tablet (20 mg total) by mouth daily., Disp: 90 tablet, Rfl: 3 .  meloxicam (MOBIC) 15 MG tablet, TAKE 1 TABLET (15 MG TOTAL) BY MOUTH DAILY., Disp: 90 tablet, Rfl: 0 .  oxybutynin (DITROPAN) 5 MG tablet, Take 1 tablet (5 mg total) by mouth 2 (two) times daily., Disp: 60 tablet, Rfl: 5 .  pantoprazole (PROTONIX) 40 MG tablet, Take 1 tablet (40 mg total) by mouth daily., Disp: 90 tablet, Rfl: 3 .  REXULTI 2 MG TABS tablet, TAKE 1 TABLET (2 MG) BY MOUTH AT BEDTIME., Disp: 90 tablet, Rfl: 0 .  traZODone (DESYREL) 100 MG tablet, TAKE 2 TABLETS BY MOUTH AT BEDTIME AS NEEDED SLEEP, Disp: 180 tablet, Rfl: 0 .  buPROPion (WELLBUTRIN SR) 150 MG  12 hr tablet, Take 1 tablet (150 mg total) by mouth 2 (two) times daily., Disp: 60 tablet, Rfl: 3  Review of Systems  Constitutional: Negative.   Respiratory: Negative.  Negative for shortness of breath.   Cardiovascular: Negative.  Negative for chest pain.  Musculoskeletal: Negative for myalgias.  Neurological: Negative for dizziness, focal weakness, light-headedness and headaches.    Social History   Tobacco Use  . Smoking status: Current Every Day Smoker    Packs/day: 0.50    Years: 40.00    Pack years: 20.00    Types: Cigarettes  . Smokeless tobacco: Never Used  . Tobacco comment: started smoking at age 89; has  quit on and off throughout the years. Has decreased cig use to 0.5 PPD  Substance Use Topics  . Alcohol use: Yes    Comment: 1-2 beers every 6 months      Objective:   BP 121/85 (BP Location: Left Arm, Patient Position: Sitting, Cuff Size: Large)   Pulse 69   Temp (!) 96.9 F (36.1 C) (Temporal)   Wt 179 lb (81.2 kg)   BMI 28.89 kg/m  Vitals:   04/01/19 1108  BP: 121/85  Pulse: 69  Temp: (!) 96.9 F (36.1 C)  TempSrc: Temporal  Weight: 179 lb (81.2 kg)  Body mass index is 28.89 kg/m.   Physical Exam Vitals reviewed.  Constitutional:      General: She is not in acute distress.    Appearance: Normal appearance. She is well-developed. She is not diaphoretic.  HENT:     Head: Normocephalic and atraumatic.  Eyes:     General: No scleral icterus.    Conjunctiva/sclera: Conjunctivae normal.  Neck:     Thyroid: No thyromegaly.  Cardiovascular:     Rate and Rhythm: Normal rate and regular rhythm.     Heart sounds: Normal heart sounds. No murmur.  Pulmonary:     Effort: Pulmonary effort is normal. No respiratory distress.     Breath sounds: Normal breath sounds. No wheezing or rales.  Musculoskeletal:        General: No deformity.     Cervical back: Neck supple.     Right lower leg: No edema.     Left lower leg: No edema.  Lymphadenopathy:     Cervical: No cervical adenopathy.  Skin:    General: Skin is warm and dry.     Findings: No rash.  Neurological:     Mental Status: She is alert and oriented to person, place, and time. Mental status is at baseline.  Psychiatric:        Mood and Affect: Mood normal. Affect is flat.        Behavior: Behavior normal.        Thought Content: Thought content normal.      No results found for any visits on 04/01/19.     Assessment & Plan    Problem List Items Addressed This Visit      Cardiovascular and Mediastinum   Essential hypertension - Primary    Well controlled Continue current medications Recheck metabolic  panel F/u in 6 months       Relevant Orders   Comprehensive metabolic panel     Other   Tobacco abuse    3 to 5-minute discussion regarding the harms of continuing smoking, and the benefits of cessation Patient has been trying to cut back and has managed to cut back from 1 pack/day to half a pack per day She has tried  Chantix in the past and reports that it did not work She is interested in trying Wellbutrin We will start Wellbutrin SR 150 mg twice daily Discussed patches and gum and that she can use these concomitantly with Wellbutrin Follow-up in 3 months      Hyperlipidemia    Continue Lipitor Recheck CMP and FLP      Relevant Orders   Lipid panel   Comprehensive metabolic panel   Gout    Asymptomatic with no recent flares Continue allopurinol Recheck uric acid level Avoid thiazide diuretics      Relevant Orders   Uric acid    Other Visit Diagnoses    Breast cancer screening by mammogram       Relevant Orders   MM 3D Screening Breast Bilateral - [facility]   Colon cancer screening       Relevant Orders   Ambulatory referral to gastroenterology for colonoscopy   Need for hepatitis C screening test       Relevant Orders   Hepatitis C Antibody   Need for influenza vaccination       Relevant Orders   Flu Vaccine QUAD 6+ mos PF IM (Fluarix Quad PF) (Completed)       Return in about 3 months (around 07/02/2019) for chronic disease f/u.   The entirety of the information documented in the History of Present Illness, Review of Systems and Physical Exam were personally obtained by me. Portions of this information were initially documented by Kavin Leech, CMA and reviewed by me for thoroughness and accuracy.    Bionca Mckey, Marzella Schlein, MD MPH [facility]

## 2019-04-01 NOTE — Assessment & Plan Note (Signed)
3 to 5-minute discussion regarding the harms of continuing smoking, and the benefits of cessation Patient has been trying to cut back and has managed to cut back from 1 pack/day to half a pack per day She has tried Chantix in the past and reports that it did not work She is interested in trying Wellbutrin We will start Wellbutrin SR 150 mg twice daily Discussed patches and gum and that she can use these concomitantly with Wellbutrin Follow-up in 3 months

## 2019-04-01 NOTE — Assessment & Plan Note (Signed)
Well controlled Continue current medications Recheck metabolic panel F/u in 6 months  

## 2019-04-02 LAB — SPECIMEN STATUS

## 2019-04-05 LAB — COMPREHENSIVE METABOLIC PANEL
ALT: 15 IU/L (ref 0–32)
AST: 14 IU/L (ref 0–40)
Albumin/Globulin Ratio: 2 (ref 1.2–2.2)
Albumin: 4.5 g/dL (ref 3.8–4.9)
Alkaline Phosphatase: 82 IU/L (ref 39–117)
BUN/Creatinine Ratio: 11 — ABNORMAL LOW (ref 12–28)
BUN: 8 mg/dL (ref 8–27)
Bilirubin Total: <0.2 mg/dL (ref 0.0–1.2)
CO2: 21 mmol/L (ref 20–29)
Calcium: 9.6 mg/dL (ref 8.7–10.3)
Chloride: 108 mmol/L — ABNORMAL HIGH (ref 96–106)
Creatinine, Ser: 0.76 mg/dL (ref 0.57–1.00)
GFR calc Af Amer: 99 mL/min/{1.73_m2} (ref >59)
GFR calc non Af Amer: 86 mL/min/{1.73_m2} (ref >59)
Globulin, Total: 2.3 g/dL (ref 1.5–4.5)
Glucose: 87 mg/dL (ref 65–99)
Potassium: 4.1 mmol/L (ref 3.5–5.2)
Sodium: 147 mmol/L — ABNORMAL HIGH (ref 134–144)
Total Protein: 6.8 g/dL (ref 6.0–8.5)

## 2019-04-05 LAB — LIPID PANEL
Chol/HDL Ratio: 3.9 ratio (ref 0.0–4.4)
Cholesterol, Total: 164 mg/dL (ref 100–199)
HDL: 42 mg/dL (ref >39)
LDL Chol Calc (NIH): 90 mg/dL (ref 0–99)
Triglycerides: 185 mg/dL — ABNORMAL HIGH (ref 0–149)
VLDL Cholesterol Cal: 32 mg/dL (ref 5–40)

## 2019-04-05 LAB — CBC WITH DIFFERENTIAL/PLATELET
Basophils Absolute: 0.1 10*3/uL (ref 0.0–0.2)
Basos: 0 %
EOS (ABSOLUTE): 0.2 10*3/uL (ref 0.0–0.4)
Eos: 1 %
Hematocrit: 42 % (ref 34.0–46.6)
Hemoglobin: 13.7 g/dL (ref 11.1–15.9)
Immature Grans (Abs): 0.1 10*3/uL (ref 0.0–0.1)
Immature Granulocytes: 1 %
Lymphocytes Absolute: 4.2 10*3/uL — ABNORMAL HIGH (ref 0.7–3.1)
Lymphs: 28 %
MCH: 29.3 pg (ref 26.6–33.0)
MCHC: 32.6 g/dL (ref 31.5–35.7)
MCV: 90 fL (ref 79–97)
Monocytes Absolute: 0.8 10*3/uL (ref 0.1–0.9)
Monocytes: 5 %
Neutrophils Absolute: 9.8 10*3/uL — ABNORMAL HIGH (ref 1.4–7.0)
Neutrophils: 65 %
Platelets: 304 10*3/uL (ref 150–450)
RBC: 4.68 x10E6/uL (ref 3.77–5.28)
RDW: 13 % (ref 11.7–15.4)
WBC: 15.1 10*3/uL — ABNORMAL HIGH (ref 3.4–10.8)

## 2019-04-05 LAB — HEPATITIS C ANTIBODY: Hep C Virus Ab: <0.1 s/co ratio (ref 0.0–0.9)

## 2019-04-05 LAB — URIC ACID: Uric Acid: 3.5 mg/dL (ref 3.0–7.2)

## 2019-04-05 LAB — SPECIMEN STATUS REPORT

## 2019-04-10 ENCOUNTER — Ambulatory Visit
Admission: EM | Admit: 2019-04-10 | Discharge: 2019-04-10 | Disposition: A | Discharge status: Home / Self Care | Payer: Medicare Other | Attending: Emergency Medicine | Admitting: Emergency Medicine

## 2019-04-10 ENCOUNTER — Other Ambulatory Visit: Payer: Self-pay

## 2019-04-10 ENCOUNTER — Telehealth: Payer: Self-pay

## 2019-04-10 DIAGNOSIS — Z1211 Encounter for screening for malignant neoplasm of colon: Secondary | ICD-10-CM

## 2019-04-10 DIAGNOSIS — Z20822 Contact with and (suspected) exposure to covid-19: Secondary | ICD-10-CM | POA: Diagnosis not present

## 2019-04-10 MED ORDER — PREDNISONE 10 MG (21) PO TBPK: ORAL_TABLET | ORAL | 0 refills | Status: DC

## 2019-04-10 MED ORDER — CETIRIZINE HCL 10 MG PO TABS: 10.0000 mg | ORAL_TABLET | Freq: Every day | ORAL | 0 refills | Status: DC

## 2019-04-10 MED ORDER — FLUTICASONE PROPIONATE 50 MCG/ACT NA SUSP: 1.0000 | Freq: Every day | NASAL | 0 refills | Status: DC

## 2019-04-10 MED ORDER — BENZONATATE 100 MG PO CAPS: 100.0000 mg | ORAL_CAPSULE | Freq: Three times a day (TID) | ORAL | 0 refills | Status: DC

## 2019-04-10 NOTE — Discharge Instructions (Signed)

## 2019-04-10 NOTE — ED Provider Notes (Signed)
[facility]    CSN: [id number] Arrival date & time: 04/10/19  1000      History   Chief Complaint Chief Complaint  Patient presents with  . Cough    HPI Marisa Sherman is a 59 y.o. female.   Resented to the urgent care for complaint of cough for the past week.  Denies sick exposure to COVID, flu or strep.  Denies recent travel.  Denies aggravating or alleviating symptoms.  Denies previous COVID infection.   Denies fever, chills, fatigue, nasal congestion, rhinorrhea, sore throat,  SOB, wheezing, chest pain, nausea, vomiting, changes in bowel or bladder habits.    The history is provided by the patient. No language interpreter was used.    Past Medical History:  Diagnosis Date  . Abnormal Pap smear of cervix   . Allergy   . Anxiety   . Bipolar disorder (HCC)   . Cataract   . Depression   . GERD (gastroesophageal reflux disease)   . Gout   . Hyperlipidemia   . Hypertension   . Overactive bladder     Patient Active Problem List   Diagnosis Date Noted  . Chronic pain of right knee 02/08/2018  . Anemia 02/07/2018  . Anxiety 02/13/2017  . Tobacco abuse 02/13/2017  . Essential hypertension 02/13/2017  . Hyperlipidemia 02/13/2017  . Insomnia 02/13/2017  . Gout 02/13/2017  . GERD (gastroesophageal reflux disease) 02/13/2017  . Neuropathy, peripheral 01/04/2016  . Bilateral plantar fasciitis 01/04/2016  . Mixed incontinence 02/24/2015  . Rectocele 02/24/2015  . Bipolar 1 disorder, depressed (HCC) 07/02/2013    Past Surgical History:  Procedure Laterality Date  . CARPAL TUNNEL RELEASE Bilateral   . CERVIX LESION DESTRUCTION  1985  . CHOLECYSTECTOMY    . DILATION AND CURETTAGE OF UTERUS    . TONSILLECTOMY    . TUBAL LIGATION      OB History    Gravida  4   Para  3   Term  3   Preterm      AB  1   Living  3     SAB  1   TAB      Ectopic      Multiple      Live Births  3            Home Medications    Prior to Admission  medications   Medication Sig Start Date End Date Taking? Authorizing Provider  allopurinol (ZYLOPRIM) 100 MG tablet Take 1 tablet (100 mg total) by mouth daily. 05/30/18   Erasmo Downer, MD  aspirin EC 81 MG tablet Take 81 mg by mouth daily.    [provider]  atorvastatin (LIPITOR) 40 MG tablet Take 1 tablet (40 mg total) by mouth daily at 6 PM. 05/30/18   Bacigalupo, Marzella Schlein, MD  benzonatate (TESSALON) 100 MG capsule Take 1 capsule (100 mg total) by mouth every 8 (eight) hours. 04/10/19   Yamilka Lopiccolo, Zachery Dakins, FNP  buPROPion (WELLBUTRIN SR) 150 MG 12 hr tablet Take 1 tablet (150 mg total) by mouth 2 (two) times daily. 04/01/19   Erasmo Downer, MD  cetirizine (ZYRTEC ALLERGY) 10 MG tablet Take 1 tablet (10 mg total) by mouth daily. 04/10/19   Sharif Rendell, Zachery Dakins, FNP  FLUoxetine (PROZAC) 40 MG capsule TAKE 1 CAPSULE (40 MG TOTAL) BY MOUTH EVERY MORNING. 02/13/19   Bacigalupo, Marzella Schlein, MD  fluticasone (FLONASE) 50 MCG/ACT nasal spray Place 1 spray into both nostrils daily for 14  days. 04/10/19 04/24/19  Durward Parcel, FNP  hydrOXYzine (VISTARIL) 25 MG capsule Take 1 capsule (25 mg total) by mouth every 8 (eight) hours as needed for anxiety. 05/30/18   Bacigalupo, Marzella Schlein, MD  lisinopril (ZESTRIL) 20 MG tablet Take 1 tablet (20 mg total) by mouth daily. 05/30/18   Bacigalupo, Marzella Schlein, MD  meloxicam (MOBIC) 15 MG tablet TAKE 1 TABLET (15 MG TOTAL) BY MOUTH DAILY. 02/12/19   Bacigalupo, Marzella Schlein, MD  oxybutynin (DITROPAN) 5 MG tablet Take 1 tablet (5 mg total) by mouth 2 (two) times daily. 02/07/18   Erasmo Downer, MD  pantoprazole (PROTONIX) 40 MG tablet Take 1 tablet (40 mg total) by mouth daily. 05/30/18   Bacigalupo, Marzella Schlein, MD  predniSONE (STERAPRED UNI-PAK 21 TAB) 10 MG (21) TBPK tablet Take 6 tabs by mouth daily  for 2 days, then 5 tabs for 2 days, then 4 tabs for 2 days, then 3 tabs for 2 days, 2 tabs for 2 days, then 1 tab by mouth daily for 2 days 04/10/19   Durward Parcel, FNP  REXULTI 2 MG TABS tablet TAKE 1 TABLET (2 MG) BY MOUTH AT BEDTIME. 02/13/19   Erasmo Downer, MD  traZODone (DESYREL) 100 MG tablet TAKE 2 TABLETS BY MOUTH AT BEDTIME AS NEEDED SLEEP 02/13/19   Bacigalupo, Marzella Schlein, MD    Family History Family History  Problem Relation Age of Onset  . Depression Mother   . Anxiety disorder Mother   . Asthma Mother   . Bipolar disorder Father   . Depression Sister   . Anxiety disorder Sister   . Depression Brother   . Anxiety disorder Brother   . Depression Brother   . Anxiety disorder Brother   . Cancer Neg Hx   . Diabetes Neg Hx   . Heart disease Neg Hx   . Colon cancer Neg Hx   . Breast cancer Neg Hx   . Ovarian cancer Neg Hx   . Cervical cancer Neg Hx     Social History Social History   Tobacco Use  . Smoking status: Current Every Day Smoker    Packs/day: 0.50    Years: 40.00    Pack years: 20.00    Types: Cigarettes  . Smokeless tobacco: Never Used  . Tobacco comment: started smoking at age 78; has quit on and off throughout the years. Has decreased cig use to 0.5 PPD  Substance Use Topics  . Alcohol use: Yes    Comment: 1-2 beers every 6 months  . Drug use: No     Allergies   Codeine, Diphenhydramine hcl, Lithium, Sulfa antibiotics, and Amoxicillin   Review of Systems Review of Systems  Constitutional: Negative.   HENT: Negative.   Respiratory: Positive for cough.   Cardiovascular: Negative.   Gastrointestinal: Negative.   Neurological: Negative.      Physical Exam Triage Vital Signs ED Triage Vitals  Enc Vitals Group     BP 04/10/19 1007 107/74     Pulse Rate 04/10/19 1007 73     Resp 04/10/19 1007 16     Temp 04/10/19 1007 97.9 F (36.6 C)     Temp src --      SpO2 04/10/19 1007 96 %     Weight --      Height --      Head Circumference --      Peak Flow --      Pain Score 04/10/19 1010 0  Pain Loc --      Pain Edu? --      Excl. in [place]? --    No data found.  Updated Vital Signs BP  107/74 (BP Location: Right Arm)   Pulse 73   Temp 97.9 F (36.6 C)   Resp 16   SpO2 96%   Visual Acuity Right Eye Distance:   Left Eye Distance:   Bilateral Distance:    Right Eye Near:   Left Eye Near:    Bilateral Near:     Physical Exam Vitals and nursing note reviewed.  Constitutional:      General: She is not in acute distress.    Appearance: Normal appearance. She is normal weight. She is not ill-appearing or toxic-appearing.  HENT:     Head: Normocephalic.     Right Ear: Tympanic membrane, ear canal and external ear normal. There is no impacted cerumen.     Left Ear: Tympanic membrane, ear canal and external ear normal. There is no impacted cerumen.     Nose: Nose normal. No congestion.     Mouth/Throat:     Mouth: Mucous membranes are moist.     Pharynx: Oropharynx is clear. No oropharyngeal exudate or posterior oropharyngeal erythema.  Cardiovascular:     Rate and Rhythm: Normal rate and regular rhythm.     Pulses: Normal pulses.     Heart sounds: Normal heart sounds. No murmur.  Pulmonary:     Effort: Pulmonary effort is normal. No respiratory distress.     Breath sounds: Normal breath sounds. No wheezing or rhonchi.  Chest:     Chest wall: No tenderness.  Skin:    Capillary Refill: Capillary refill takes less than 2 seconds.  Neurological:     Mental Status: She is alert and oriented to person, place, and time.      UC Treatments / Results  Labs (all labs ordered are listed, but only abnormal results are displayed) Labs Reviewed  NOVEL CORONAVIRUS, NAA    EKG   Radiology No results found.  Procedures Procedures (including critical care time)  Medications Ordered in UC Medications - No data to display  Initial Impression / Assessment and Plan / UC Course  I have reviewed the triage vital signs and the nursing notes.  Pertinent labs & imaging results that were available during my care of the patient were reviewed by me and considered in my  medical decision making (see chart for details).    Patient is stable at discharge. COVID-19 test was ordered Advised patient to quarantine Flonase was prescribed Zyrtec was prescribed Tessalon Perles prescribed Prednisone was prescribed Strict ER return was given  Final Clinical Impressions(s) / UC Diagnoses   Final diagnoses:  Suspected COVID-19 virus infection     Discharge Instructions     COVID testing ordered.  It will take between 2-7 days for test results.  Someone will contact you regarding abnormal results.    In the meantime: You should remain isolated in your home for 10 days from symptom onset AND greater than 24 hours after symptoms resolution (absence of fever without the use of fever-reducing medication and improvement in respiratory symptoms), whichever is longer Get plenty of rest and push fluids Tessalon Perles prescribed for cough Zyrtec-D prescribed for nasal congestion, runny nose, and/or sore throat Flonase prescribed for nasal congestion and runny nose Prednisone as prescribed Use medications daily for symptom relief Use OTC medications like ibuprofen or tylenol as needed fever or pain Call or  go to the ED if you have any new or worsening symptoms such as fever, worsening cough, shortness of breath, chest tightness, chest pain, turning blue, changes in mental status, etc...     ED Prescriptions    Medication Sig Dispense Auth. Provider   predniSONE (STERAPRED UNI-PAK 21 TAB) 10 MG (21) TBPK tablet Take 6 tabs by mouth daily  for 2 days, then 5 tabs for 2 days, then 4 tabs for 2 days, then 3 tabs for 2 days, 2 tabs for 2 days, then 1 tab by mouth daily for 2 days 42 tablet Kinesha Auten S, FNP   fluticasone (FLONASE) 50 MCG/ACT nasal spray Place 1 spray into both nostrils daily for 14 days. 16 g Trudi Morgenthaler, Zachery Dakins, FNP   cetirizine (ZYRTEC ALLERGY) 10 MG tablet Take 1 tablet (10 mg total) by mouth daily. 30 tablet Dakari Stabler, Zachery Dakins, FNP    benzonatate (TESSALON) 100 MG capsule Take 1 capsule (100 mg total) by mouth every 8 (eight) hours. 30 capsule Bayan Hedstrom, Zachery Dakins, FNP     PDMP not reviewed this encounter.   Durward Parcel, FNP 04/10/19 1037

## 2019-04-10 NOTE — ED Triage Notes (Signed)
Pt presents with c/o cough and cold symptoms for past week

## 2019-04-10 NOTE — Telephone Encounter (Signed)
Gastroenterology Pre-Procedure Review  Request Date: Friday 05/24/19 Requesting Physician: Dr. Tobi Bastos  PATIENT REVIEW QUESTIONS: The patient responded to the following health history questions as indicated:    1. Are you having any GI issues? no 2. Do you have a personal history of Polyps? no 3. Do you have a family history of Colon Cancer or Polyps? no 4. Diabetes Mellitus? no 5. Joint replacements in the past 12 months?no 6. Major health problems in the past 3 months?no 7. Any artificial heart valves, MVP, or defibrillator?no    MEDICATIONS & ALLERGIES:    Patient reports the following regarding taking any anticoagulation/antiplatelet therapy:   Plavix, Coumadin, Eliquis, Xarelto, Lovenox, Pradaxa, Brilinta, or Effient? no Aspirin? yes (81 mg daily)  Patient confirms/reports the following medications:  Current Outpatient Medications  Medication Sig Dispense Refill  . allopurinol (ZYLOPRIM) 100 MG tablet Take 1 tablet (100 mg total) by mouth daily. 90 tablet 3  . aspirin EC 81 MG tablet Take 81 mg by mouth daily.    Marland Kitchen atorvastatin (LIPITOR) 40 MG tablet Take 1 tablet (40 mg total) by mouth daily at 6 PM. 90 tablet 3  . benzonatate (TESSALON) 100 MG capsule Take 1 capsule (100 mg total) by mouth every 8 (eight) hours. 30 capsule 0  . buPROPion (WELLBUTRIN SR) 150 MG 12 hr tablet Take 1 tablet (150 mg total) by mouth 2 (two) times daily. 60 tablet 3  . cetirizine (ZYRTEC ALLERGY) 10 MG tablet Take 1 tablet (10 mg total) by mouth daily. 30 tablet 0  . FLUoxetine (PROZAC) 40 MG capsule TAKE 1 CAPSULE (40 MG TOTAL) BY MOUTH EVERY MORNING. 90 capsule 0  . fluticasone (FLONASE) 50 MCG/ACT nasal spray Place 1 spray into both nostrils daily for 14 days. 16 g 0  . hydrOXYzine (VISTARIL) 25 MG capsule Take 1 capsule (25 mg total) by mouth every 8 (eight) hours as needed for anxiety. 180 capsule 3  . lisinopril (ZESTRIL) 20 MG tablet Take 1 tablet (20 mg total) by mouth daily. 90 tablet 3  .  meloxicam (MOBIC) 15 MG tablet TAKE 1 TABLET (15 MG TOTAL) BY MOUTH DAILY. 90 tablet 0  . oxybutynin (DITROPAN) 5 MG tablet Take 1 tablet (5 mg total) by mouth 2 (two) times daily. 60 tablet 5  . pantoprazole (PROTONIX) 40 MG tablet Take 1 tablet (40 mg total) by mouth daily. 90 tablet 3  . predniSONE (STERAPRED UNI-PAK 21 TAB) 10 MG (21) TBPK tablet Take 6 tabs by mouth daily  for 2 days, then 5 tabs for 2 days, then 4 tabs for 2 days, then 3 tabs for 2 days, 2 tabs for 2 days, then 1 tab by mouth daily for 2 days 42 tablet 0  . REXULTI 2 MG TABS tablet TAKE 1 TABLET (2 MG) BY MOUTH AT BEDTIME. 90 tablet 0  . traZODone (DESYREL) 100 MG tablet TAKE 2 TABLETS BY MOUTH AT BEDTIME AS NEEDED SLEEP 180 tablet 0   No current facility-administered medications for this visit.    Patient confirms/reports the following allergies:  Allergies  Allergen Reactions  . Codeine Nausea Only  . Diphenhydramine Hcl Other (See Comments)    Pt unsure why  . Lithium Itching  . Sulfa Antibiotics Nausea And Vomiting  . Amoxicillin     N & V    No orders of the defined types were placed in this encounter.   AUTHORIZATION INFORMATION Primary Insurance: 1D#: Group #:  Secondary Insurance: 1D#: Group #:  SCHEDULE INFORMATION: Date:  Friday 05/24/19 Time: Location:[facility]

## 2019-04-11 LAB — NOVEL CORONAVIRUS, NAA: SARS-CoV-2, NAA: NOT DETECTED

## 2019-05-22 ENCOUNTER — Other Ambulatory Visit: Admission: RE | Admit: 2019-05-22 | Payer: Medicare Other | Source: Ambulatory Visit

## 2019-05-24 ENCOUNTER — Encounter: Admission: RE | Payer: Self-pay | Source: Home / Self Care

## 2019-05-24 ENCOUNTER — Ambulatory Visit: Admission: RE | Admit: 2019-05-24 | Payer: Medicare Other | Source: Home / Self Care | Admitting: Gastroenterology

## 2019-05-24 SURGERY — COLONOSCOPY WITH PROPOFOL
Anesthesia: General

## 2019-06-03 ENCOUNTER — Telehealth: Payer: Self-pay | Admitting: Family Medicine

## 2019-06-03 NOTE — Telephone Encounter (Signed)
Attempted to schedule AWV. Unable to LVM.  Will try at later time. No voicemail setup 

## 2019-07-03 ENCOUNTER — Ambulatory Visit: Payer: Medicare Other | Admitting: Family Medicine

## 2019-07-03 NOTE — Progress Notes (Deleted)
Established patient visit   Patient: Marisa Sherman   DOB: 1960-11-01   59 y.o. Female  MRN: [id number] Visit Date: 07/03/2019  Today's healthcare provider: Lavon Paganini, MD   No chief complaint on file.  Subjective    HPI Follow up for Tobacco abuse  The patient was last seen for this 3 months ago. Changes made at last visit include start Wellbutrin.  She reports {excellent/good/fair/poor:19665} compliance with treatment. She feels that condition is {improved/worse/unchanged:3041574}. She {is/is not:21021397} having side effects. ***  -----------------------------------------------------------------------------------------    Patient Active Problem List   Diagnosis Date Noted  . Chronic pain of right knee 02/08/2018  . Anemia 02/07/2018  . Anxiety 02/13/2017  . Tobacco abuse 02/13/2017  . Essential hypertension 02/13/2017  . Hyperlipidemia 02/13/2017  . Insomnia 02/13/2017  . Gout 02/13/2017  . GERD (gastroesophageal reflux disease) 02/13/2017  . Neuropathy, peripheral 01/04/2016  . Bilateral plantar fasciitis 01/04/2016  . Mixed incontinence 02/24/2015  . Rectocele 02/24/2015  . Bipolar 1 disorder, depressed ([place]) 07/02/2013   Social History   Tobacco Use  . Smoking status: Current Every Day Smoker    Packs/day: 0.50    Years: 40.00    Pack years: 20.00    Types: Cigarettes  . Smokeless tobacco: Never Used  . Tobacco comment: started smoking at age 41; has quit on and off throughout the years. Has decreased cig use to 0.5 PPD  Substance Use Topics  . Alcohol use: Yes    Comment: 1-2 beers every 6 months  . Drug use: No       Medications: Outpatient Medications Prior to Visit  Medication Sig  . allopurinol (ZYLOPRIM) 100 MG tablet Take 1 tablet (100 mg total) by mouth daily.  Marland Kitchen aspirin EC 81 MG tablet Take 81 mg by mouth daily.  Marland Kitchen atorvastatin (LIPITOR) 40 MG tablet Take 1 tablet (40 mg total) by mouth daily at 6 PM.  . benzonatate  (TESSALON) 100 MG capsule Take 1 capsule (100 mg total) by mouth every 8 (eight) hours.  Marland Kitchen buPROPion (WELLBUTRIN SR) 150 MG 12 hr tablet Take 1 tablet (150 mg total) by mouth 2 (two) times daily.  . cetirizine (ZYRTEC ALLERGY) 10 MG tablet Take 1 tablet (10 mg total) by mouth daily.  Marland Kitchen FLUoxetine (PROZAC) 40 MG capsule TAKE 1 CAPSULE (40 MG TOTAL) BY MOUTH EVERY MORNING.  . fluticasone (FLONASE) 50 MCG/ACT nasal spray Place 1 spray into both nostrils daily for 14 days.  . hydrOXYzine (VISTARIL) 25 MG capsule Take 1 capsule (25 mg total) by mouth every 8 (eight) hours as needed for anxiety.  Marland Kitchen lisinopril (ZESTRIL) 20 MG tablet Take 1 tablet (20 mg total) by mouth daily.  . meloxicam (MOBIC) 15 MG tablet TAKE 1 TABLET (15 MG TOTAL) BY MOUTH DAILY.  Marland Kitchen oxybutynin (DITROPAN) 5 MG tablet Take 1 tablet (5 mg total) by mouth 2 (two) times daily.  . pantoprazole (PROTONIX) 40 MG tablet Take 1 tablet (40 mg total) by mouth daily.  . predniSONE (STERAPRED UNI-PAK 21 TAB) 10 MG (21) TBPK tablet Take 6 tabs by mouth daily  for 2 days, then 5 tabs for 2 days, then 4 tabs for 2 days, then 3 tabs for 2 days, 2 tabs for 2 days, then 1 tab by mouth daily for 2 days  . REXULTI 2 MG TABS tablet TAKE 1 TABLET (2 MG) BY MOUTH AT BEDTIME.  . traZODone (DESYREL) 100 MG tablet TAKE 2 TABLETS BY MOUTH AT BEDTIME AS NEEDED  SLEEP   No facility-administered medications prior to visit.    Review of Systems  {Heme  Chem  Endocrine  Serology  Results Review (optional):23779::" "}  Objective    There were no vitals taken for this visit. BP Readings from Last 3 Encounters:  04/10/19 107/74  04/01/19 121/85  06/04/18 139/88   Wt Readings from Last 3 Encounters:  04/01/19 179 lb (81.2 kg)  06/04/18 162 lb (73.5 kg)  02/07/18 162 lb 9.6 oz (73.8 kg)      Physical Exam  ***  No results found for any visits on 07/03/19.  Assessment & Plan     ***  No follow-ups on file.      {provider  attestation***:1}   Shirlee Latch, MD  [facility] [phone] (phone) [phone] (fax)  [facility]

## 2019-07-04 ENCOUNTER — Other Ambulatory Visit: Payer: Self-pay

## 2019-07-04 ENCOUNTER — Emergency Department (HOSPITAL_COMMUNITY)
Admission: EM | Admit: 2019-07-04 | Discharge: 2019-07-04 | Disposition: A | Discharge status: Home / Self Care | Payer: Medicare Other | Attending: Emergency Medicine | Admitting: Emergency Medicine

## 2019-07-04 ENCOUNTER — Encounter (HOSPITAL_COMMUNITY): Payer: Self-pay | Admitting: *Deleted

## 2019-07-04 DIAGNOSIS — Z7982 Long term (current) use of aspirin: Secondary | ICD-10-CM | POA: Diagnosis not present

## 2019-07-04 DIAGNOSIS — I10 Essential (primary) hypertension: Secondary | ICD-10-CM | POA: Diagnosis not present

## 2019-07-04 DIAGNOSIS — F17210 Nicotine dependence, cigarettes, uncomplicated: Secondary | ICD-10-CM | POA: Insufficient documentation

## 2019-07-04 DIAGNOSIS — M545 Low back pain, unspecified: Secondary | ICD-10-CM

## 2019-07-04 DIAGNOSIS — Z79899 Other long term (current) drug therapy: Secondary | ICD-10-CM | POA: Diagnosis not present

## 2019-07-04 DIAGNOSIS — E785 Hyperlipidemia, unspecified: Secondary | ICD-10-CM | POA: Insufficient documentation

## 2019-07-04 DIAGNOSIS — M549 Dorsalgia, unspecified: Secondary | ICD-10-CM | POA: Diagnosis present

## 2019-07-04 MED ORDER — MELOXICAM 15 MG PO TABS: 15.0000 mg | ORAL_TABLET | Freq: Every day | ORAL | 0 refills | Status: DC

## 2019-07-04 MED ORDER — METHOCARBAMOL 500 MG PO TABS: 500.0000 mg | ORAL_TABLET | Freq: Three times a day (TID) | ORAL | 0 refills | Status: DC | PRN

## 2019-07-04 MED ORDER — KETOROLAC TROMETHAMINE 60 MG/2ML IM SOLN
15.0000 mg | Freq: Once | INTRAMUSCULAR | Status: AC
  Administered 2019-07-04: 15 mg via INTRAMUSCULAR
  Filled 2019-07-04: qty 2

## 2019-07-04 MED ORDER — TIZANIDINE HCL 4 MG PO TABS: 4.0000 mg | ORAL_TABLET | Freq: Four times a day (QID) | ORAL | 0 refills | Status: DC | PRN

## 2019-07-04 MED ORDER — DEXAMETHASONE 4 MG PO TABS
16.0000 mg | ORAL_TABLET | Freq: Once | ORAL | Status: AC
  Administered 2019-07-04: 16 mg via ORAL
  Filled 2019-07-04: qty 4

## 2019-07-04 MED ORDER — CYCLOBENZAPRINE HCL 10 MG PO TABS: 10.0000 mg | ORAL_TABLET | Freq: Three times a day (TID) | ORAL | 0 refills | Status: DC

## 2019-07-04 MED ORDER — HYDROMORPHONE HCL 1 MG/ML IJ SOLN
1.0000 mg | Freq: Once | INTRAMUSCULAR | Status: AC
  Administered 2019-07-04: 1 mg via INTRAMUSCULAR
  Filled 2019-07-04: qty 1

## 2019-07-04 NOTE — ED Triage Notes (Addendum)
Back pain for  2 weeks, worse with movement

## 2019-07-04 NOTE — ED Provider Notes (Signed)
Patient could not afford Robaxin.  So switched it to Flexeril sent in electronically to [facility].  Nurse is calling to see what the cost would be for that.  Patient also does have a good Rx card.   Vanetta Mulders, MD 07/04/19 [phone]

## 2019-07-17 DIAGNOSIS — Z23 Encounter for immunization: Secondary | ICD-10-CM | POA: Diagnosis not present

## 2019-07-18 NOTE — ED Provider Notes (Signed)
[facility] Provider Note   CSN: [id number] Arrival date & time: 07/04/19  1142     History Chief Complaint  Patient presents with  . Back Pain    Marisa Sherman is a 59 y.o. female.  HPI   59 year old female with back pain.  Gradual onset about 2 weeks ago.  Pain has been persistent since.  Mild ache at rest.  Worse with movement.  Pain is in the mid to lower back.  Does not lateralize.  No lower extremity numbness or weakness.  No acute urinary complaints.  Past Medical History:  Diagnosis Date  . Abnormal Pap smear of cervix   . Allergy   . Anxiety   . Bipolar disorder ([place])   . Cataract   . Depression   . GERD (gastroesophageal reflux disease)   . Gout   . Hyperlipidemia   . Hypertension   . Overactive bladder     Patient Active Problem List   Diagnosis Date Noted  . Chronic pain of right knee 02/08/2018  . Anemia 02/07/2018  . Anxiety 02/13/2017  . Tobacco abuse 02/13/2017  . Essential hypertension 02/13/2017  . Hyperlipidemia 02/13/2017  . Insomnia 02/13/2017  . Gout 02/13/2017  . GERD (gastroesophageal reflux disease) 02/13/2017  . Neuropathy, peripheral 01/04/2016  . Bilateral plantar fasciitis 01/04/2016  . Mixed incontinence 02/24/2015  . Rectocele 02/24/2015  . Bipolar 1 disorder, depressed ([place]) 07/02/2013    Past Surgical History:  Procedure Laterality Date  . CARPAL TUNNEL RELEASE Bilateral   . CERVIX LESION DESTRUCTION  1985  . CHOLECYSTECTOMY    . DILATION AND CURETTAGE OF UTERUS    . TONSILLECTOMY    . TUBAL LIGATION       OB History    Gravida  4   Para  3   Term  3   Preterm      AB  1   Living  3     SAB  1   TAB      Ectopic      Multiple      Live Births  3           Family History  Problem Relation Age of Onset  . Depression Mother   . Anxiety disorder Mother   . Asthma Mother   . Bipolar disorder Father   . Depression Sister   . Anxiety disorder Sister   . Depression Brother    . Anxiety disorder Brother   . Depression Brother   . Anxiety disorder Brother   . Cancer Neg Hx   . Diabetes Neg Hx   . Heart disease Neg Hx   . Colon cancer Neg Hx   . Breast cancer Neg Hx   . Ovarian cancer Neg Hx   . Cervical cancer Neg Hx     Social History   Tobacco Use  . Smoking status: Current Every Day Smoker    Packs/day: 0.50    Years: 40.00    Pack years: 20.00    Types: Cigarettes  . Smokeless tobacco: Never Used  . Tobacco comment: started smoking at age 33; has quit on and off throughout the years. Has decreased cig use to 0.5 PPD  Vaping Use  . Vaping Use: Never used  Substance Use Topics  . Alcohol use: Yes    Comment: 1-2 beers every 6 months  . Drug use: No    Home Medications Prior to Admission medications   Medication Sig Start Date End Date  Taking? Authorizing Provider  allopurinol (ZYLOPRIM) 100 MG tablet Take 1 tablet (100 mg total) by mouth daily. 05/30/18   Erasmo Downer, MD  aspirin EC 81 MG tablet Take 81 mg by mouth daily.    [provider]  atorvastatin (LIPITOR) 40 MG tablet Take 1 tablet (40 mg total) by mouth daily at 6 PM. 05/30/18   Bacigalupo, Marzella Schlein, MD  benzonatate (TESSALON) 100 MG capsule Take 1 capsule (100 mg total) by mouth every 8 (eight) hours. 04/10/19   Avegno, Zachery Dakins, FNP  buPROPion (WELLBUTRIN SR) 150 MG 12 hr tablet Take 1 tablet (150 mg total) by mouth 2 (two) times daily. 04/01/19   Erasmo Downer, MD  cetirizine (ZYRTEC ALLERGY) 10 MG tablet Take 1 tablet (10 mg total) by mouth daily. 04/10/19   Avegno, Zachery Dakins, FNP  FLUoxetine (PROZAC) 40 MG capsule TAKE 1 CAPSULE (40 MG TOTAL) BY MOUTH EVERY MORNING. 02/13/19   Bacigalupo, Marzella Schlein, MD  fluticasone (FLONASE) 50 MCG/ACT nasal spray Place 1 spray into both nostrils daily for 14 days. 04/10/19 04/24/19  Avegno, Zachery Dakins, FNP  hydrOXYzine (VISTARIL) 25 MG capsule Take 1 capsule (25 mg total) by mouth every 8 (eight) hours as needed for anxiety.  05/30/18   Bacigalupo, Marzella Schlein, MD  lisinopril (ZESTRIL) 20 MG tablet Take 1 tablet (20 mg total) by mouth daily. 05/30/18   Bacigalupo, Marzella Schlein, MD  meloxicam (MOBIC) 15 MG tablet TAKE 1 TABLET (15 MG TOTAL) BY MOUTH DAILY. 02/12/19   Bacigalupo, Marzella Schlein, MD  meloxicam (MOBIC) 15 MG tablet Take 1 tablet (15 mg total) by mouth daily. 07/04/19   Raeford Razor, MD  oxybutynin (DITROPAN) 5 MG tablet Take 1 tablet (5 mg total) by mouth 2 (two) times daily. 02/07/18   Erasmo Downer, MD  pantoprazole (PROTONIX) 40 MG tablet Take 1 tablet (40 mg total) by mouth daily. 05/30/18   Bacigalupo, Marzella Schlein, MD  predniSONE (STERAPRED UNI-PAK 21 TAB) 10 MG (21) TBPK tablet Take 6 tabs by mouth daily  for 2 days, then 5 tabs for 2 days, then 4 tabs for 2 days, then 3 tabs for 2 days, 2 tabs for 2 days, then 1 tab by mouth daily for 2 days 04/10/19   Avegno, Zachery Dakins, FNP  REXULTI 2 MG TABS tablet TAKE 1 TABLET (2 MG) BY MOUTH AT BEDTIME. 02/13/19   Bacigalupo, Marzella Schlein, MD  tiZANidine (ZANAFLEX) 4 MG tablet Take 1 tablet (4 mg total) by mouth every 6 (six) hours as needed for muscle spasms. 07/04/19   Vanetta Mulders, MD  traZODone (DESYREL) 100 MG tablet TAKE 2 TABLETS BY MOUTH AT BEDTIME AS NEEDED SLEEP 02/13/19   Erasmo Downer, MD    Allergies    Codeine, Diphenhydramine hcl, Lithium, Sulfa antibiotics, and Amoxicillin  Review of Systems   Review of Systems All systems reviewed and negative, other than as noted in HPI.  Physical Exam Updated Vital Signs BP (!) 175/102 (BP Location: Right Arm)   Pulse 65   Temp 98.6 F (37 C) (Oral)   Resp 20   Ht 5\' 6"  (1.676 m)   Wt 81.6 kg   SpO2 99%   BMI 29.05 kg/m   Physical Exam Vitals and nursing note reviewed.  Constitutional:      General: She is not in acute distress.    Appearance: She is well-developed.  HENT:     Head: Normocephalic and atraumatic.  Eyes:     General:  Right eye: No discharge.        Left eye: No discharge.      Conjunctiva/sclera: Conjunctivae normal.  Cardiovascular:     Rate and Rhythm: Normal rate and regular rhythm.     Heart sounds: Normal heart sounds. No murmur heard.  No friction rub. No gallop.   Pulmonary:     Effort: Pulmonary effort is normal. No respiratory distress.     Breath sounds: Normal breath sounds.  Abdominal:     General: There is no distension.     Palpations: Abdomen is soft.     Tenderness: There is no abdominal tenderness.  Musculoskeletal:        General: No tenderness.     Cervical back: Neck supple.     Comments: Mild tenderness to palpation paraspinally in the lower thoracic to upper lumbar back.  Is not sniffily worse in the midline.  No overlying skin changes.  Normal strength and sensation in lower extremities.  Skin:    General: Skin is warm and dry.  Neurological:     Mental Status: She is alert.  Psychiatric:        Behavior: Behavior normal.        Thought Content: Thought content normal.     ED Results / Procedures / Treatments   Labs (all labs ordered are listed, but only abnormal results are displayed) Labs Reviewed - No data to display  EKG None  Radiology No results found.  Procedures Procedures (including critical care time)  Medications Ordered in ED Medications  HYDROmorphone (DILAUDID) injection 1 mg (1 mg Intramuscular Given 07/04/19 1440)  ketorolac (TORADOL) injection 15 mg (15 mg Intramuscular Given 07/04/19 1438)  dexamethasone (DECADRON) tablet 16 mg (16 mg Oral Given 07/04/19 1437)    ED Course  I have reviewed the triage vital signs and the nursing notes.  Pertinent labs & imaging results that were available during my care of the patient were reviewed by me and considered in my medical decision making (see chart for details).    MDM Rules/Calculators/A&P                         59 year old female with lower back pain without red flags.  Symptomatic treatment.  Outpatient follow-up.    Final Clinical Impression(s) /  ED Diagnoses Final diagnoses:  Acute midline low back pain without sciatica    Rx / DC Orders ED Discharge Orders         Ordered    meloxicam (MOBIC) 15 MG tablet  Daily     Discontinue  Reprint     07/04/19 1411    methocarbamol (ROBAXIN) 500 MG tablet  Every 8 hours PRN,   Status:  Discontinued     Reprint     07/04/19 1411    cyclobenzaprine (FLEXERIL) 10 MG tablet  3 times daily,   Status:  Discontinued     Reprint     07/04/19 1635    tiZANidine (ZANAFLEX) 4 MG tablet  Every 6 hours PRN     Discontinue  Reprint     07/04/19 1641           Raeford Razor, MD 07/18/19 1648

## 2019-07-19 ENCOUNTER — Telehealth: Payer: Self-pay | Admitting: Gastroenterology

## 2019-07-19 NOTE — Telephone Encounter (Signed)
Returned patients call to reschedule her colonoscopy with Dr. Tobi Bastos.  LVM to letting her know that Dr. Tobi Bastos will be on vacation that week however we can schedule for the week of 08/23 or the following week.  Asked her to call me back advising her availability.  Thanks,  [place], [place]

## 2019-07-19 NOTE — Telephone Encounter (Signed)
Patient called & would like to r/s her colonoscopy she was unable to have on 04-2019. She has 8-3 or 8-4 as dates she would like to have it done by DR Tobi Bastos. Please call

## 2019-07-25 ENCOUNTER — Telehealth: Payer: Self-pay

## 2019-07-25 NOTE — Telephone Encounter (Signed)
Returned patients call to reschedule her colonoscopy with Dr. Tobi Bastos.  LVM for her to call the office back to schedule.  Thanks,  [place], [place]

## 2019-07-25 NOTE — Telephone Encounter (Signed)
Patient called back and wants to rescheduled her colonoscopy

## 2019-07-30 ENCOUNTER — Encounter: Payer: Self-pay | Admitting: Physician Assistant

## 2019-07-30 ENCOUNTER — Other Ambulatory Visit: Payer: Self-pay

## 2019-07-30 ENCOUNTER — Ambulatory Visit (INDEPENDENT_AMBULATORY_CARE_PROVIDER_SITE_OTHER): Payer: Medicare Other | Admitting: Physician Assistant

## 2019-07-30 VITALS — BP 144/98 | HR 78 | Temp 98.6°F | Wt 186.2 lb

## 2019-07-30 DIAGNOSIS — E782 Mixed hyperlipidemia: Secondary | ICD-10-CM | POA: Diagnosis not present

## 2019-07-30 DIAGNOSIS — F419 Anxiety disorder, unspecified: Secondary | ICD-10-CM

## 2019-07-30 DIAGNOSIS — G4700 Insomnia, unspecified: Secondary | ICD-10-CM | POA: Diagnosis not present

## 2019-07-30 DIAGNOSIS — I10 Essential (primary) hypertension: Secondary | ICD-10-CM | POA: Diagnosis not present

## 2019-07-30 MED ORDER — TRAZODONE HCL 100 MG PO TABS: ORAL_TABLET | ORAL | 0 refills | Status: DC

## 2019-07-30 MED ORDER — HYDROXYZINE PAMOATE 25 MG PO CAPS: 25.0000 mg | ORAL_CAPSULE | Freq: Three times a day (TID) | ORAL | 0 refills | Status: DC | PRN

## 2019-07-30 NOTE — Progress Notes (Signed)
Established patient visit   Patient: Marisa Sherman   DOB: 1960/12/13   59 y.o. Female  MRN: [id number] Visit Date: 07/30/2019  Today's healthcare provider: Trey Sailors, PA-C   Chief Complaint  Patient presents with  . Hyperlipidemia  . Hypertension  . Anxiety  . Insomnia  I,Porsha C McClurkin,acting as a scribe for Trey Sailors, PA-C.,have documented all relevant documentation on the behalf of Trey Sailors, PA-C,as directed by  Trey Sailors, PA-C while in the presence of Trey Sailors, PA-C.  Subjective    HPI  Hypertension, follow-up  BP Readings from Last 3 Encounters:  07/30/19 (!) 144/98  07/04/19 (!) 175/102  04/10/19 107/74   Wt Readings from Last 3 Encounters:  07/30/19 186 lb 3.2 oz (84.5 kg)  07/04/19 180 lb (81.6 kg)  04/01/19 179 lb (81.2 kg)     She was last seen for hypertension 4 months ago.  BP at that visit was 121/85. Management since that visit includes no changes.  She reports good compliance with treatment. Patient reports she may miss a dosage once a week. She is not having side effects.  She is following a Regular diet. She is not exercising. She does smoke.  Use of agents associated with hypertension: none.   Outside blood pressures are not being chacked. Symptoms: No chest pain No chest pressure  No palpitations No syncope  No dyspnea No orthopnea  No paroxysmal nocturnal dyspnea No lower extremity edema   Pertinent labs: Lab Results  Component Value Date   CHOL 164 04/01/2019   HDL 42 04/01/2019   LDLCALC 90 04/01/2019   TRIG 185 (H) 04/01/2019   CHOLHDL 3.9 04/01/2019   Lab Results  Component Value Date   NA 147 (H) 04/01/2019   K 4.1 04/01/2019   CREATININE 0.76 04/01/2019   GFRNONAA 86 04/01/2019   GFRAA 99 04/01/2019   GLUCOSE 87 04/01/2019     The 10-year ASCVD risk score Denman George DC Jr., et al., 2013) is: 12.7%    Lipid/Cholesterol, Follow-up  Last lipid panel Other pertinent labs  Lab  Results  Component Value Date   CHOL 164 04/01/2019   HDL 42 04/01/2019   LDLCALC 90 04/01/2019   TRIG 185 (H) 04/01/2019   CHOLHDL 3.9 04/01/2019   Lab Results  Component Value Date   ALT 15 04/01/2019   AST 14 04/01/2019   PLT WILL FOLLOW 04/01/2019   PLT 304 04/01/2019   TSH 1.720 07/05/2013     She was last seen for this 4 months ago.  Management since that visit includes no changes.  She reports good compliance with treatment. She is not having side effects.    Insomnia Follow up  She presents today for follow up of insomnia. She was last seen for this 14 months ago. Management changes included no changes. She is not having adverse reaction from treatment.  Insomnia is getting better. She does not have difficulty FALLING asleep. She does not have difficulty STAYING asleep.  She is taking stimulant medications. Trazodone She is not taking new medications:  She is not taking OTC sleeping aid. . She is not taking medications to help sleep. She is not drinking alcohol to help sleep. She is not using illicit drugs.  -------------------------------------------------------------------- Patient reports she will started seen a psychiatrist on 08/07/2019. Anxiety, Follow-up  She was last seen for anxiety 14 months ago. Changes made at last visit include no changes.   She reports good compliance  with treatment. She reports good tolerance of treatment. She is not having side effects.   She feels her anxiety is mild and Improved since last visit.  Symptoms: No chest pain No difficulty concentrating  No dizziness No fatigue  No feelings of losing control No insomnia  No irritable No palpitations  No panic attacks No racing thoughts  No shortness of breath No sweating  No tremors/shakes    GAD-7 Results GAD-7 Generalized Anxiety Disorder Screening Tool 07/30/2019  1. Feeling Nervous, Anxious, or on Edge 3  2. Not Being Able to Stop or Control Worrying 3  3.  Worrying Too Much About Different Things 3  4. Trouble Relaxing 3  5. Being So Restless it's Hard To Sit Still 3  6. Becoming Easily Annoyed or Irritable 3  7. Feeling Afraid As If Something Awful Might Happen 1  Total GAD-7 Score 19  Difficulty At Work, Home, or Getting  Along With Others? Extremely difficult    PHQ-9 Scores PHQ9 SCORE ONLY 05/01/2017 02/13/2017  PHQ-9 Total Score 9 10    ---------------------------------------------------------------------------------------------------  Patient reports she smoke 2 cigarettes a day.       Medications: Outpatient Medications Prior to Visit  Medication Sig  . allopurinol (ZYLOPRIM) 100 MG tablet Take 1 tablet (100 mg total) by mouth daily.  Marland Kitchen aspirin EC 81 MG tablet Take 81 mg by mouth daily.  Marland Kitchen atorvastatin (LIPITOR) 40 MG tablet Take 1 tablet (40 mg total) by mouth daily at 6 PM.  . buPROPion (WELLBUTRIN SR) 150 MG 12 hr tablet Take 1 tablet (150 mg total) by mouth 2 (two) times daily.  . cetirizine (ZYRTEC ALLERGY) 10 MG tablet Take 1 tablet (10 mg total) by mouth daily.  Marland Kitchen FLUoxetine (PROZAC) 40 MG capsule TAKE 1 CAPSULE (40 MG TOTAL) BY MOUTH EVERY MORNING.  Marland Kitchen lisinopril (ZESTRIL) 20 MG tablet Take 1 tablet (20 mg total) by mouth daily.  . meloxicam (MOBIC) 15 MG tablet TAKE 1 TABLET (15 MG TOTAL) BY MOUTH DAILY.  . meloxicam (MOBIC) 15 MG tablet Take 1 tablet (15 mg total) by mouth daily.  Marland Kitchen oxybutynin (DITROPAN) 5 MG tablet Take 1 tablet (5 mg total) by mouth 2 (two) times daily.  . pantoprazole (PROTONIX) 40 MG tablet Take 1 tablet (40 mg total) by mouth daily.  Marland Kitchen REXULTI 2 MG TABS tablet TAKE 1 TABLET (2 MG) BY MOUTH AT BEDTIME.  . [DISCONTINUED] hydrOXYzine (VISTARIL) 25 MG capsule Take 1 capsule (25 mg total) by mouth every 8 (eight) hours as needed for anxiety.  . [DISCONTINUED] traZODone (DESYREL) 100 MG tablet TAKE 2 TABLETS BY MOUTH AT BEDTIME AS NEEDED SLEEP  . benzonatate (TESSALON) 100 MG capsule Take 1 capsule  (100 mg total) by mouth every 8 (eight) hours. (Patient not taking: Reported on 07/30/2019)  . fluticasone (FLONASE) 50 MCG/ACT nasal spray Place 1 spray into both nostrils daily for 14 days.  . predniSONE (STERAPRED UNI-PAK 21 TAB) 10 MG (21) TBPK tablet Take 6 tabs by mouth daily  for 2 days, then 5 tabs for 2 days, then 4 tabs for 2 days, then 3 tabs for 2 days, 2 tabs for 2 days, then 1 tab by mouth daily for 2 days (Patient not taking: Reported on 07/30/2019)  . tiZANidine (ZANAFLEX) 4 MG tablet Take 1 tablet (4 mg total) by mouth every 6 (six) hours as needed for muscle spasms. (Patient not taking: Reported on 07/30/2019)   No facility-administered medications prior to visit.    Review  of Systems  Constitutional: Negative.   Respiratory: Negative.   Cardiovascular: Negative.   Musculoskeletal: Negative.   Neurological: Negative.     {Heme  Chem  Endocrine  Serology  Results Review (optional):[id number]::" "}  Objective    BP (!) 144/98 (BP Location: Left Arm, Patient Position: Sitting, Cuff Size: Normal)   Pulse 78   Temp 98.6 F (37 C) (Temporal)   Wt 186 lb 3.2 oz (84.5 kg)   SpO2 98%   BMI 30.05 kg/m    Physical Exam Constitutional:      Appearance: Normal appearance.  Cardiovascular:     Rate and Rhythm: Normal rate and regular rhythm.     Heart sounds: Normal heart sounds.  Pulmonary:     Effort: Pulmonary effort is normal.     Breath sounds: Normal breath sounds.  Skin:    General: Skin is warm and dry.  Neurological:     Mental Status: She is alert and oriented to person, place, and time. Mental status is at baseline.  Psychiatric:        Mood and Affect: Mood normal.        Behavior: Behavior normal.       No results found for any visits on 07/30/19.  Assessment & Plan    1. Mixed hyperlipidemia Previously well controlled Continue statin Goal LDL < 100   2. Essential hypertension Well controlled Continue current medications    3. Anxiety Patient  reports she will start seeing a psychiatrist at [facility] on 08/07/2019. - hydrOXYzine (VISTARIL) 25 MG capsule; Take 1 capsule (25 mg total) by mouth every 8 (eight) hours as needed for anxiety.  Dispense: 180 capsule; Refill: 0  4. Insomnia, unspecified type  - traZODone (DESYREL) 100 MG tablet; TAKE 2 TABLETS BY MOUTH AT BEDTIME AS NEEDED SLEEP  Dispense: 180 tablet; Refill: 0   Return in about 6 months (around 01/30/2020) for HTN&HLD.      ITrey Sailors, PA-C, have reviewed all documentation for this visit. The documentation on 07/31/19 for the exam, diagnosis, procedures, and orders are all accurate and complete.    Maryella Shivers  [facility] [phone] (phone) [phone] (fax)  [facility]

## 2019-07-31 ENCOUNTER — Telehealth: Payer: Self-pay

## 2019-07-31 ENCOUNTER — Ambulatory Visit
Admission: RE | Admit: 2019-07-31 | Discharge: 2019-07-31 | Disposition: A | Discharge status: Home / Self Care | Payer: Medicare Other | Source: Ambulatory Visit | Attending: Family Medicine | Admitting: Family Medicine

## 2019-07-31 DIAGNOSIS — Z1211 Encounter for screening for malignant neoplasm of colon: Secondary | ICD-10-CM

## 2019-07-31 DIAGNOSIS — Z1231 Encounter for screening mammogram for malignant neoplasm of breast: Secondary | ICD-10-CM | POA: Diagnosis present

## 2019-07-31 NOTE — Telephone Encounter (Signed)
Gastroenterology Pre-Procedure Review  Request Date: Friday 09/13/19 Requesting Physician: Dr. Tobi Bastos  PATIENT REVIEW QUESTIONS: The patient responded to the following health history questions as indicated:    1. Are you having any GI issues? no 2. Do you have a personal history of Polyps? no 3. Do you have a family history of Colon Cancer or Polyps? no 4. Diabetes Mellitus? no 5. Joint replacements in the past 12 months?no 6. Major health problems in the past 3 months?no 7. Any artificial heart valves, MVP, or defibrillator?no    MEDICATIONS & ALLERGIES:    Patient reports the following regarding taking any anticoagulation/antiplatelet therapy:   Plavix, Coumadin, Eliquis, Xarelto, Lovenox, Pradaxa, Brilinta, or Effient? no Aspirin? yes (81 mg daily)  Patient confirms/reports the following medications:  Current Outpatient Medications  Medication Sig Dispense Refill   allopurinol (ZYLOPRIM) 100 MG tablet Take 1 tablet (100 mg total) by mouth daily. 90 tablet 3   aspirin EC 81 MG tablet Take 81 mg by mouth daily.     atorvastatin (LIPITOR) 40 MG tablet Take 1 tablet (40 mg total) by mouth daily at 6 PM. 90 tablet 3   benzonatate (TESSALON) 100 MG capsule Take 1 capsule (100 mg total) by mouth every 8 (eight) hours. (Patient not taking: Reported on 07/30/2019) 30 capsule 0   buPROPion (WELLBUTRIN SR) 150 MG 12 hr tablet Take 1 tablet (150 mg total) by mouth 2 (two) times daily. 60 tablet 3   cetirizine (ZYRTEC ALLERGY) 10 MG tablet Take 1 tablet (10 mg total) by mouth daily. 30 tablet 0   FLUoxetine (PROZAC) 40 MG capsule TAKE 1 CAPSULE (40 MG TOTAL) BY MOUTH EVERY MORNING. 90 capsule 0   fluticasone (FLONASE) 50 MCG/ACT nasal spray Place 1 spray into both nostrils daily for 14 days. 16 g 0   hydrOXYzine (VISTARIL) 25 MG capsule Take 1 capsule (25 mg total) by mouth every 8 (eight) hours as needed for anxiety. 180 capsule 0   lisinopril (ZESTRIL) 20 MG tablet Take 1 tablet (20 mg  total) by mouth daily. 90 tablet 3   meloxicam (MOBIC) 15 MG tablet TAKE 1 TABLET (15 MG TOTAL) BY MOUTH DAILY. 90 tablet 0   meloxicam (MOBIC) 15 MG tablet Take 1 tablet (15 mg total) by mouth daily. 10 tablet 0   oxybutynin (DITROPAN) 5 MG tablet Take 1 tablet (5 mg total) by mouth 2 (two) times daily. 60 tablet 5   pantoprazole (PROTONIX) 40 MG tablet Take 1 tablet (40 mg total) by mouth daily. 90 tablet 3   predniSONE (STERAPRED UNI-PAK 21 TAB) 10 MG (21) TBPK tablet Take 6 tabs by mouth daily  for 2 days, then 5 tabs for 2 days, then 4 tabs for 2 days, then 3 tabs for 2 days, 2 tabs for 2 days, then 1 tab by mouth daily for 2 days (Patient not taking: Reported on 07/30/2019) 42 tablet 0   REXULTI 2 MG TABS tablet TAKE 1 TABLET (2 MG) BY MOUTH AT BEDTIME. 90 tablet 0   tiZANidine (ZANAFLEX) 4 MG tablet Take 1 tablet (4 mg total) by mouth every 6 (six) hours as needed for muscle spasms. (Patient not taking: Reported on 07/30/2019) 30 tablet 0   traZODone (DESYREL) 100 MG tablet TAKE 2 TABLETS BY MOUTH AT BEDTIME AS NEEDED SLEEP 180 tablet 0   No current facility-administered medications for this visit.    Patient confirms/reports the following allergies:  Allergies  Allergen Reactions   Codeine Nausea Only   Diphenhydramine  Hcl Other (See Comments)    Pt unsure why   Lithium Itching   Sulfa Antibiotics Nausea And Vomiting   Amoxicillin     N & V    No orders of the defined types were placed in this encounter.   AUTHORIZATION INFORMATION Primary Insurance: 1D#: Group #:  Secondary Insurance: 1D#: Group #:  SCHEDULE INFORMATION: Date: Friday 09/13/19 Time: Location:[facility]

## 2019-07-31 NOTE — Telephone Encounter (Signed)
Patient is calling wanting to rescheduled her colonoscopy. Please call patient at [phone]

## 2019-08-01 ENCOUNTER — Other Ambulatory Visit: Payer: Self-pay | Admitting: Family Medicine

## 2019-08-01 DIAGNOSIS — R2232 Localized swelling, mass and lump, left upper limb: Secondary | ICD-10-CM

## 2019-08-01 DIAGNOSIS — R928 Other abnormal and inconclusive findings on diagnostic imaging of breast: Secondary | ICD-10-CM

## 2019-08-02 ENCOUNTER — Other Ambulatory Visit: Payer: Self-pay | Admitting: Adult Health

## 2019-08-02 DIAGNOSIS — R928 Other abnormal and inconclusive findings on diagnostic imaging of breast: Secondary | ICD-10-CM

## 2019-08-02 DIAGNOSIS — R2232 Localized swelling, mass and lump, left upper limb: Secondary | ICD-10-CM

## 2019-08-02 NOTE — Progress Notes (Signed)
FINDINGS: In the left axilla, a possible mass or abnormal lymph node warrants further evaluation. In the right breast, no findings suspicious for malignancy.Images were processed with CAD.  Orders Placed This Encounter  Procedures  . MM Digital Diagnostic Bilat    FINDINGS: In the left axilla, a possible mass or abnormal lymph node warrants further evaluation. In the right breast, no findings suspicious for malignancy.Images were processed with CAD.    Order Specific Question:   Reason for Exam (SYMPTOM  OR DIAGNOSIS REQUIRED)    Answer:   FINDINGS: mass or lymph in left axilla    Order Specific Question:   Preferred imaging location?    Answer:   [facility]  . US BREAST LTD UNI LEFT INC AXILLA    FINDINGS: In the left axilla, a possible mass or abnormal lymph node warrants further evaluation. In the right breast, no findings suspicious for malignancy.Images were processed with CAD.    Order Specific Question:   Reason for Exam (SYMPTOM  OR DIAGNOSIS REQUIRED)    Answer:   lext axilla mass on screening.    Order Specific Question:   Preferred imaging location?    Answer:   [facility]

## 2019-08-02 NOTE — Progress Notes (Signed)
Screening mammogram shows possible mass or enlarged lymph node in left axilla needing further imaging.  Additional diagnostic mammogram and ultrasound order was placed. Breast center will be contacting patient to schedule.

## 2019-08-05 ENCOUNTER — Other Ambulatory Visit: Payer: Self-pay | Admitting: Adult Health

## 2019-08-05 DIAGNOSIS — R928 Other abnormal and inconclusive findings on diagnostic imaging of breast: Secondary | ICD-10-CM

## 2019-08-06 ENCOUNTER — Emergency Department (HOSPITAL_COMMUNITY): Payer: Medicare Other

## 2019-08-06 ENCOUNTER — Encounter (HOSPITAL_COMMUNITY): Payer: Self-pay | Admitting: Emergency Medicine

## 2019-08-06 ENCOUNTER — Other Ambulatory Visit: Payer: Self-pay

## 2019-08-06 ENCOUNTER — Emergency Department (HOSPITAL_COMMUNITY)
Admission: EM | Admit: 2019-08-06 | Discharge: 2019-08-06 | Disposition: A | Discharge status: Home / Self Care | Payer: Medicare Other | Attending: Emergency Medicine | Admitting: Emergency Medicine

## 2019-08-06 DIAGNOSIS — Z79899 Other long term (current) drug therapy: Secondary | ICD-10-CM | POA: Diagnosis not present

## 2019-08-06 DIAGNOSIS — Z7982 Long term (current) use of aspirin: Secondary | ICD-10-CM | POA: Insufficient documentation

## 2019-08-06 DIAGNOSIS — F17210 Nicotine dependence, cigarettes, uncomplicated: Secondary | ICD-10-CM | POA: Diagnosis not present

## 2019-08-06 DIAGNOSIS — M545 Low back pain, unspecified: Secondary | ICD-10-CM

## 2019-08-06 DIAGNOSIS — I10 Essential (primary) hypertension: Secondary | ICD-10-CM | POA: Insufficient documentation

## 2019-08-06 MED ORDER — TIZANIDINE HCL 4 MG PO TABS: 4.0000 mg | ORAL_TABLET | Freq: Four times a day (QID) | ORAL | 0 refills | Status: DC | PRN

## 2019-08-06 MED ORDER — TRAMADOL HCL 50 MG PO TABS: 50.0000 mg | ORAL_TABLET | Freq: Four times a day (QID) | ORAL | 0 refills | Status: DC | PRN

## 2019-08-06 MED ORDER — KETOROLAC TROMETHAMINE 30 MG/ML IJ SOLN
30.0000 mg | Freq: Once | INTRAMUSCULAR | Status: AC
  Administered 2019-08-06: 30 mg via INTRAMUSCULAR
  Filled 2019-08-06: qty 1

## 2019-08-06 MED ORDER — METHOCARBAMOL 500 MG PO TABS
500.0000 mg | ORAL_TABLET | Freq: Once | ORAL | Status: AC
  Administered 2019-08-06: 500 mg via ORAL
  Filled 2019-08-06: qty 1

## 2019-08-06 NOTE — ED Triage Notes (Signed)
Patient having lower back pain x 2 days, stated she injured it lifting a mattress, has chronic back pain.  Take Meloxicam for knee pain, it hasn't helped back pain.

## 2019-08-06 NOTE — ED Provider Notes (Signed)
[facility] EMERGENCY DEPARTMENT Provider Note   CSN: [id number] Arrival date & time: 08/06/19  1220     History Chief Complaint  Patient presents with  . Back Pain    Marisa Sherman is a 59 y.o. female.  HPI      Marisa Sherman is a 59 y.o. female with past medically history of hypertension, anxiety, and chronic back pain who presents to the Emergency Department complaining of increasing back pain for several days after moving a mattress. She describes a dull pain along her mid to right lower back that is worse with walking and standing. Pain improves at rest. Pain radiates into the right buttocks. Pain does not radiate into her legs. She denies fall, abdominal pain, urine or bowel changes, fever or chills. She has tried meloxicam prior to arrival without relief of her symptoms.    Past Medical History:  Diagnosis Date  . Abnormal Pap smear of cervix   . Allergy   . Anxiety   . Bipolar disorder (HCC)   . Cataract   . Depression   . GERD (gastroesophageal reflux disease)   . Gout   . Hyperlipidemia   . Hypertension   . Overactive bladder     Patient Active Problem List   Diagnosis Date Noted  . Chronic pain of right knee 02/08/2018  . Anemia 02/07/2018  . Anxiety 02/13/2017  . Tobacco abuse 02/13/2017  . Essential hypertension 02/13/2017  . Hyperlipidemia 02/13/2017  . Insomnia 02/13/2017  . Gout 02/13/2017  . GERD (gastroesophageal reflux disease) 02/13/2017  . Neuropathy, peripheral 01/04/2016  . Bilateral plantar fasciitis 01/04/2016  . Mixed incontinence 02/24/2015  . Rectocele 02/24/2015  . Bipolar 1 disorder, depressed (HCC) 07/02/2013    Past Surgical History:  Procedure Laterality Date  . CARPAL TUNNEL RELEASE Bilateral   . CERVIX LESION DESTRUCTION  1985  . CHOLECYSTECTOMY    . DILATION AND CURETTAGE OF UTERUS    . TONSILLECTOMY    . TUBAL LIGATION       OB History    Gravida  4   Para  3   Term  3   Preterm      AB  1   Living  3      SAB  1   TAB      Ectopic      Multiple      Live Births  3           Family History  Problem Relation Age of Onset  . Depression Mother   . Anxiety disorder Mother   . Asthma Mother   . Bipolar disorder Father   . Depression Sister   . Anxiety disorder Sister   . Depression Brother   . Anxiety disorder Brother   . Depression Brother   . Anxiety disorder Brother   . Cancer Neg Hx   . Diabetes Neg Hx   . Heart disease Neg Hx   . Colon cancer Neg Hx   . Breast cancer Neg Hx   . Ovarian cancer Neg Hx   . Cervical cancer Neg Hx     Social History   Tobacco Use  . Smoking status: Current Every Day Smoker    Packs/day: 0.50    Years: 40.00    Pack years: 20.00    Types: Cigarettes  . Smokeless tobacco: Never Used  . Tobacco comment: started smoking at age 60; has quit on and off throughout the years. Has decreased cig use to 0.5 PPD  Vaping Use  . Vaping Use: Never used  Substance Use Topics  . Alcohol use: Yes    Comment: 1-2 beers every 6 months  . Drug use: No    Home Medications Prior to Admission medications   Medication Sig Start Date End Date Taking? Authorizing Provider  allopurinol (ZYLOPRIM) 100 MG tablet Take 1 tablet (100 mg total) by mouth daily. 05/30/18   Erasmo DownerBacigalupo, Angela M, MD  aspirin EC 81 MG tablet Take 81 mg by mouth daily.    [provider]  atorvastatin (LIPITOR) 40 MG tablet Take 1 tablet (40 mg total) by mouth daily at 6 PM. 05/30/18   Bacigalupo, Marzella SchleinAngela M, MD  benzonatate (TESSALON) 100 MG capsule Take 1 capsule (100 mg total) by mouth every 8 (eight) hours. Patient not taking: Reported on 07/30/2019 04/10/19   Durward ParcelAvegno, Komlanvi S, FNP  buPROPion [facility](WELLBUTRIN SR) 150 MG 12 hr tablet Take 1 tablet (150 mg total) by mouth 2 (two) times daily. 04/01/19   Erasmo DownerBacigalupo, Angela M, MD  cetirizine (ZYRTEC ALLERGY) 10 MG tablet Take 1 tablet (10 mg total) by mouth daily. 04/10/19   Avegno, Zachery DakinsKomlanvi S, FNP  FLUoxetine (PROZAC) 40 MG capsule  TAKE 1 CAPSULE (40 MG TOTAL) BY MOUTH EVERY MORNING. 02/13/19   Bacigalupo, Marzella SchleinAngela M, MD  fluticasone (FLONASE) 50 MCG/ACT nasal spray Place 1 spray into both nostrils daily for 14 days. 04/10/19 04/24/19  Avegno, Zachery DakinsKomlanvi S, FNP  hydrOXYzine (VISTARIL) 25 MG capsule Take 1 capsule (25 mg total) by mouth every 8 (eight) hours as needed for anxiety. 07/30/19   Trey SailorsPollak, Adriana M, PA-C  lisinopril (ZESTRIL) 20 MG tablet Take 1 tablet (20 mg total) by mouth daily. 05/30/18   Bacigalupo, Marzella SchleinAngela M, MD  meloxicam (MOBIC) 15 MG tablet TAKE 1 TABLET (15 MG TOTAL) BY MOUTH DAILY. 02/12/19   Bacigalupo, Marzella SchleinAngela M, MD  meloxicam (MOBIC) 15 MG tablet Take 1 tablet (15 mg total) by mouth daily. 07/04/19   Raeford RazorKohut, Stephen, MD  oxybutynin (DITROPAN) 5 MG tablet Take 1 tablet (5 mg total) by mouth 2 (two) times daily. 02/07/18   Erasmo DownerBacigalupo, Angela M, MD  pantoprazole (PROTONIX) 40 MG tablet Take 1 tablet (40 mg total) by mouth daily. 05/30/18   Bacigalupo, Marzella SchleinAngela M, MD  predniSONE (STERAPRED UNI-PAK 21 TAB) 10 MG (21) TBPK tablet Take 6 tabs by mouth daily  for 2 days, then 5 tabs for 2 days, then 4 tabs for 2 days, then 3 tabs for 2 days, 2 tabs for 2 days, then 1 tab by mouth daily for 2 days Patient not taking: Reported on 07/30/2019 04/10/19   Avegno, Zachery DakinsKomlanvi S, FNP  REXULTI 2 MG TABS tablet TAKE 1 TABLET (2 MG) BY MOUTH AT BEDTIME. 02/13/19   Bacigalupo, Marzella SchleinAngela M, MD  tiZANidine (ZANAFLEX) 4 MG tablet Take 1 tablet (4 mg total) by mouth every 6 (six) hours as needed for muscle spasms. Patient not taking: Reported on 07/30/2019 07/04/19   Vanetta MuldersZackowski, Scott, MD  traZODone (DESYREL) 100 MG tablet TAKE 2 TABLETS BY MOUTH AT BEDTIME AS NEEDED SLEEP 07/30/19   Trey SailorsPollak, Adriana M, PA-C    Allergies    Codeine, Diphenhydramine hcl, Lithium, Sulfa antibiotics, and Amoxicillin  Review of Systems   Review of Systems  Constitutional: Negative for fever.  Respiratory: Negative for shortness of breath.   Gastrointestinal: Negative for  abdominal pain, constipation, nausea and vomiting.  Genitourinary: Negative for decreased urine volume, difficulty urinating, dysuria, flank pain and hematuria.  Musculoskeletal: Positive for back  pain. Negative for joint swelling and neck pain.  Skin: Negative for rash.  Neurological: Negative for dizziness, syncope, weakness and numbness.    Physical Exam Updated Vital Signs BP (!) 145/89 (BP Location: Right Arm)   Pulse 72   Temp 98.3 F (36.8 C) (Oral)   Resp 20   Ht 5\' 6"  (1.676 m)   Wt 84.4 kg   SpO2 99%   BMI 30.02 kg/m   Physical Exam Vitals and nursing note reviewed.  Constitutional:      General: She is not in acute distress.    Appearance: Normal appearance. She is well-developed.  HENT:     Head: Normocephalic and atraumatic.  Cardiovascular:     Rate and Rhythm: Normal rate and regular rhythm.     Pulses: Normal pulses.     Comments: DP pulses are strong and palpable bilaterally Pulmonary:     Effort: Pulmonary effort is normal. No respiratory distress.  Abdominal:     General: There is no distension.     Palpations: Abdomen is soft.     Tenderness: There is no abdominal tenderness.  Musculoskeletal:        General: Tenderness present.     Cervical back: Normal range of motion and neck supple.     Lumbar back: Tenderness present. No swelling, deformity or lacerations. Normal range of motion.     Right lower leg: No edema.     Left lower leg: No edema.     Comments: ttp of the right lower lumbar paraspinal muscles and right SI joint space.  Pt has 5/5 strength against resistance of bilateral lower extremities.     Skin:    General: Skin is warm.     Capillary Refill: Capillary refill takes less than 2 seconds.     Findings: No rash.  Neurological:     General: No focal deficit present.     Mental Status: She is alert and oriented to person, place, and time.     Sensory: No sensory deficit.     Motor: No weakness or abnormal muscle tone.     Gait: Gait  normal.     Deep Tendon Reflexes:     Reflex Scores:      Patellar reflexes are 2+ on the right side and 2+ on the left side.      Achilles reflexes are 2+ on the right side and 2+ on the left side.    ED Results / Procedures / Treatments   Labs (all labs ordered are listed, but only abnormal results are displayed) Labs Reviewed - No data to display  EKG None  Radiology DG Lumbar Spine Complete  Result Date: 08/06/2019 CLINICAL DATA:  Low back pain EXAM: LUMBAR SPINE - COMPLETE 4+ VIEW COMPARISON:  None. FINDINGS: Degenerative disc disease at L5-S1 with disc space narrowing, spurring and vacuum disc. Normal alignment. No fracture. SI joints symmetric and unremarkable. IMPRESSION: Mild degenerative disc disease at the lumbosacral junction. No acute findings. Electronically Signed   By: 08/08/2019 M.D.   On: 08/06/2019 16:36    Procedures Procedures (including critical care time)  Medications Ordered in ED Medications - No data to display  ED Course  I have reviewed the triage vital signs and the nursing notes.  Pertinent labs & imaging results that were available during my care of the patient were reviewed by me and considered in my medical decision making (see chart for details).    MDM Rules/Calculators/A&P  Patient with likely acute on chronic low back pain, worsened with recent lifting. Neurovascularly intact. Ambulatory with a steady gait. No focal neuro deficits. No concerning symptoms for cauda equina. X-ray shows degenerative disc disease at the L5-S1. Patient currently taking anti-inflammatory. I feel that she is appropriate for discharge home, will provide a short course of pain medication and muscle relaxer. She agrees to follow-up with PCP.   Final Clinical Impression(s) / ED Diagnoses Final diagnoses:  Acute right-sided low back pain without sciatica    Rx / DC Orders ED Discharge Orders    None       Pauline Aus,  PA-C 08/06/19 1718    Geoffery Lyons, MD 08/06/19 2258

## 2019-08-06 NOTE — ED Notes (Signed)
Patient transported to x-ray. ?

## 2019-08-06 NOTE — Discharge Instructions (Addendum)
Your x-ray today shows some degenerative changes of your lower back.  Alternate ice and heat to your lower back.  Take medication as directed.  Avoid heavy lifting or twisting movements for at least 1 week.  Call your primary doctor to arrange a follow-up appointment

## 2019-08-09 ENCOUNTER — Telehealth: Payer: Self-pay | Admitting: Family Medicine

## 2019-08-09 ENCOUNTER — Telehealth: Payer: Self-pay

## 2019-08-09 DIAGNOSIS — M545 Low back pain, unspecified: Secondary | ICD-10-CM

## 2019-08-09 MED ORDER — TRAMADOL HCL 50 MG PO TABS: 50.0000 mg | ORAL_TABLET | Freq: Four times a day (QID) | ORAL | 0 refills | Status: DC | PRN

## 2019-08-09 NOTE — Telephone Encounter (Signed)
Copied from CRM [id number]. Topic: General - Inquiry >> Aug 09, 2019  1:24 PM Leary Roca wrote: Reason for CRM: Pt is needing referral request for back pain . Pt was seen in the ER for issue . Please advise

## 2019-08-09 NOTE — Telephone Encounter (Signed)
Pt called and is requesting to have a short supply of a pain medication due to her back pain. Pt has an appt on 08/16/19. Pt states that she was given pain medication in the hospital, but is now out of it. Please advise.     [facility] [address], [place] - [address]  [address] [place] [address]  Phone: [phone] Fax: [phone]  Hours: Not open 24 hours

## 2019-08-09 NOTE — Telephone Encounter (Signed)
Yes we will have to see to refer as they require office notes with referrals.

## 2019-08-09 NOTE — Telephone Encounter (Signed)
Sent in 7 days to cover until she is seen

## 2019-08-09 NOTE — Telephone Encounter (Signed)
Does she need a visit?

## 2019-08-12 NOTE — Telephone Encounter (Signed)
Patient is scheduled for Friday 08/16/19

## 2019-08-14 DIAGNOSIS — Z23 Encounter for immunization: Secondary | ICD-10-CM | POA: Diagnosis not present

## 2019-08-16 ENCOUNTER — Ambulatory Visit (INDEPENDENT_AMBULATORY_CARE_PROVIDER_SITE_OTHER): Payer: Medicare Other | Admitting: Physician Assistant

## 2019-08-16 ENCOUNTER — Other Ambulatory Visit: Payer: Self-pay

## 2019-08-16 ENCOUNTER — Encounter: Payer: Self-pay | Admitting: Physician Assistant

## 2019-08-16 VITALS — BP 139/87 | HR 80 | Temp 97.5°F | Resp 16 | Wt 189.0 lb

## 2019-08-16 DIAGNOSIS — M545 Low back pain, unspecified: Secondary | ICD-10-CM

## 2019-08-16 DIAGNOSIS — M5137 Other intervertebral disc degeneration, lumbosacral region: Secondary | ICD-10-CM

## 2019-08-16 MED ORDER — METHYLPREDNISOLONE 4 MG PO TBPK: ORAL_TABLET | ORAL | 0 refills | Status: DC

## 2019-08-16 MED ORDER — TRAMADOL HCL 50 MG PO TABS: 50.0000 mg | ORAL_TABLET | Freq: Three times a day (TID) | ORAL | 0 refills | Status: DC | PRN

## 2019-08-16 MED ORDER — TIZANIDINE HCL 4 MG PO TABS: 4.0000 mg | ORAL_TABLET | Freq: Three times a day (TID) | ORAL | 0 refills | Status: DC | PRN

## 2019-08-16 NOTE — Patient Instructions (Signed)

## 2019-08-16 NOTE — Progress Notes (Signed)
I,Marisa Sherman,acting as a scribe for [employer], Marisa Sherman.,have documented all relevant documentation on the behalf of Marisa Loveless, Marisa Sherman,as directed by  Marisa Loveless, Marisa Sherman while in the presence of Marisa Sherman, Marisa Sherman.   Established patient visit   Patient: Marisa Sherman   DOB: 1960-05-24   59 y.o. Female  MRN: [id number] Visit Date: 08/16/2019  Today's healthcare provider: Margaretann Loveless, Marisa Sherman   Chief Complaint  Patient presents with  . Back Pain  . Follow-up   Subjective    HPI  Follow up ER visit  Patient was seen in ER for back pain on 08/06/2019. She was treated for acute right sided low back pain without sciatica. X-ray showed degenerative disc disease at the L5-S1. Treatment for back pain included prescribing a short course of pain medication and muscle relaxer. She reports good compliance with treatment. She reports this condition is Unchanged. She reports the pain medication helps to improve pain tolerance. Pain is aggravated with twisting movement.  -----------------------------------------------------------------------------------------   Patient Active Problem List   Diagnosis Date Noted  . Chronic pain of right knee 02/08/2018  . Anemia 02/07/2018  . Anxiety 02/13/2017  . Tobacco abuse 02/13/2017  . Essential hypertension 02/13/2017  . Hyperlipidemia 02/13/2017  . Insomnia 02/13/2017  . Gout 02/13/2017  . GERD (gastroesophageal reflux disease) 02/13/2017  . Neuropathy, peripheral 01/04/2016  . Bilateral plantar fasciitis 01/04/2016  . Mixed incontinence 02/24/2015  . Rectocele 02/24/2015  . Bipolar 1 disorder, depressed (HCC) 07/02/2013   Past Medical History:  Diagnosis Date  . Abnormal Pap smear of cervix   . Allergy   . Anxiety   . Bipolar disorder (HCC)   . Cataract   . Depression   . GERD (gastroesophageal reflux disease)   . Gout   . Hyperlipidemia   . Hypertension   . Overactive bladder         Medications: Outpatient Medications Prior to Visit  Medication Sig  . allopurinol (ZYLOPRIM) 100 MG tablet Take 1 tablet (100 mg total) by mouth daily.  Marland Kitchen aspirin EC 81 MG tablet Take 81 mg by mouth daily.  Marland Kitchen atorvastatin (LIPITOR) 40 MG tablet Take 1 tablet (40 mg total) by mouth daily at 6 PM.  . buPROPion (WELLBUTRIN SR) 150 MG 12 hr tablet Take 1 tablet (150 mg total) by mouth 2 (two) times daily.  . cetirizine (ZYRTEC ALLERGY) 10 MG tablet Take 1 tablet (10 mg total) by mouth daily.  Marland Kitchen FLUoxetine (PROZAC) 40 MG capsule TAKE 1 CAPSULE (40 MG TOTAL) BY MOUTH EVERY MORNING.  . hydrOXYzine (VISTARIL) 25 MG capsule Take 1 capsule (25 mg total) by mouth every 8 (eight) hours as needed for anxiety.  Marland Kitchen lisinopril (ZESTRIL) 20 MG tablet Take 1 tablet (20 mg total) by mouth daily.  . meloxicam (MOBIC) 15 MG tablet Take 1 tablet (15 mg total) by mouth daily.  . pantoprazole (PROTONIX) 40 MG tablet Take 1 tablet (40 mg total) by mouth daily.  Marland Kitchen REXULTI 2 MG TABS tablet TAKE 1 TABLET (2 MG) BY MOUTH AT BEDTIME.  . traZODone (DESYREL) 100 MG tablet TAKE 2 TABLETS BY MOUTH AT BEDTIME AS NEEDED SLEEP  . oxybutynin (DITROPAN) 5 MG tablet Take 1 tablet (5 mg total) by mouth 2 (two) times daily. (Patient not taking: Reported on 08/16/2019)  . [DISCONTINUED] benzonatate (TESSALON) 100 MG capsule Take 1 capsule (100 mg total) by mouth every 8 (eight) hours. (Patient not taking: Reported on 07/30/2019)  . [  DISCONTINUED] fluticasone (FLONASE) 50 MCG/ACT nasal spray Place 1 spray into both nostrils daily for 14 days.  . [DISCONTINUED] meloxicam (MOBIC) 15 MG tablet TAKE 1 TABLET (15 MG TOTAL) BY MOUTH DAILY.  . [DISCONTINUED] predniSONE (STERAPRED UNI-PAK 21 TAB) 10 MG (21) TBPK tablet Take 6 tabs by mouth daily  for 2 days, then 5 tabs for 2 days, then 4 tabs for 2 days, then 3 tabs for 2 days, 2 tabs for 2 days, then 1 tab by mouth daily for 2 days (Patient not taking: Reported on 07/30/2019)  . [DISCONTINUED]  tiZANidine (ZANAFLEX) 4 MG tablet Take 1 tablet (4 mg total) by mouth every 6 (six) hours as needed for muscle spasms. (Patient not taking: Reported on 08/16/2019)  . [DISCONTINUED] traMADol (ULTRAM) 50 MG tablet Take 1 tablet (50 mg total) by mouth every 6 (six) hours as needed for up to 7 days. (Patient not taking: Reported on 08/16/2019)   No facility-administered medications prior to visit.    Review of Systems  Constitutional: Negative for appetite change, chills, fatigue and fever.  Respiratory: Negative for chest tightness and shortness of breath.   Cardiovascular: Negative for chest pain and palpitations.  Gastrointestinal: Negative for abdominal pain, nausea and vomiting.  Musculoskeletal: Positive for back pain and myalgias.  Neurological: Negative for dizziness, weakness and numbness.    Last CBC Lab Results  Component Value Date   WBC WILL FOLLOW 04/01/2019   WBC 15.1 (H) 04/01/2019   HGB WILL FOLLOW 04/01/2019   HGB 13.7 04/01/2019   HCT WILL FOLLOW 04/01/2019   HCT 42.0 04/01/2019   MCV WILL FOLLOW 04/01/2019   MCV 90 04/01/2019   MCH WILL FOLLOW 04/01/2019   MCH 29.3 04/01/2019   RDW WILL FOLLOW 04/01/2019   RDW 13.0 04/01/2019   PLT WILL FOLLOW 04/01/2019   PLT 304 04/01/2019   Last metabolic panel Lab Results  Component Value Date   GLUCOSE 87 04/01/2019   NA 147 (H) 04/01/2019   K 4.1 04/01/2019   CL 108 (H) 04/01/2019   CO2 21 04/01/2019   BUN 8 04/01/2019   CREATININE 0.76 04/01/2019   GFRNONAA 86 04/01/2019   GFRAA 99 04/01/2019   CALCIUM 9.6 04/01/2019   PROT 6.8 04/01/2019   ALBUMIN 4.5 04/01/2019   LABGLOB 2.3 04/01/2019   AGRATIO 2.0 04/01/2019   BILITOT <0.2 04/01/2019   ALKPHOS 82 04/01/2019   AST 14 04/01/2019   ALT 15 04/01/2019   ANIONGAP 9 08/24/2014      Objective    BP (!) 139/87 (BP Location: Right Arm, Cuff Size: Large)   Pulse 80   Temp (!) 97.5 F (36.4 C) (Temporal)   Resp 16   Wt 189 lb (85.7 kg)   BMI 30.51 kg/m   BP Readings from Last 3 Encounters:  08/16/19 (!) 139/87  08/06/19 (!) 144/92  07/30/19 (!) 144/98   Wt Readings from Last 3 Encounters:  08/16/19 189 lb (85.7 kg)  08/06/19 186 lb (84.4 kg)  07/30/19 186 lb 3.2 oz (84.5 kg)      Physical Exam Vitals reviewed.  Constitutional:      General: She is not in acute distress.    Appearance: Normal appearance. She is well-developed. She is obese. She is not ill-appearing or diaphoretic.  Neck:     Thyroid: No thyromegaly.     Vascular: No JVD.     Trachea: No tracheal deviation.  Cardiovascular:     Rate and Rhythm: Normal rate and regular rhythm.  Pulses: Normal pulses.     Heart sounds: Normal heart sounds. No murmur heard.  No friction rub. No gallop.   Pulmonary:     Effort: Pulmonary effort is normal. No respiratory distress.     Breath sounds: Normal breath sounds. No wheezing or rales.  Musculoskeletal:     Cervical back: Normal, normal range of motion and neck supple.     Thoracic back: Tenderness present. No spasms or bony tenderness. Decreased range of motion.     Lumbar back: Tenderness present. No bony tenderness. Decreased range of motion. Negative right straight leg raise test and negative left straight leg raise test.  Lymphadenopathy:     Cervical: No cervical adenopathy.  Neurological:     Mental Status: She is alert.       No results found for any visits on 08/16/19.  Assessment & Plan     Problem List Items Addressed This Visit      Musculoskeletal and Integument   DDD (degenerative disc disease), lumbosacral    Noted on xray from 08/06/19 No change in pain despite prednisone taper and meloxicam Will try medrol dose pak Continue tramadol Continue Tizanidine for spasms Moist heat Back exercises printed on AVS Will refer to ortho spine and to Physical therapy for further evaluation Call if worsening      Relevant Medications   methylPREDNISolone (MEDROL) 4 MG TBPK tablet   tiZANidine  (ZANAFLEX) 4 MG tablet   traMADol (ULTRAM) 50 MG tablet   Other Relevant Orders   Ambulatory referral to Physical Therapy   Ambulatory referral to Orthopedic Surgery     Other   Acute right-sided low back pain without sciatica - Primary    Unchanged Continue tizanidine and tramadol for now Add medrol PT ordered Referral to Ortho Spine Call if worsening      Relevant Medications   methylPREDNISolone (MEDROL) 4 MG TBPK tablet   tiZANidine (ZANAFLEX) 4 MG tablet   traMADol (ULTRAM) 50 MG tablet   Other Relevant Orders   Ambulatory referral to Physical Therapy   Ambulatory referral to Orthopedic Surgery      No follow-ups on file.      Marisa Islam, Marisa Sherman, have reviewed all documentation for this visit. The documentation on 08/20/19 for the exam, diagnosis, procedures, and orders are all accurate and complete.   Reine Just  [facility] [phone] (phone) [phone] (fax)  [facility]

## 2019-08-20 ENCOUNTER — Encounter: Payer: Self-pay | Admitting: Physician Assistant

## 2019-08-20 DIAGNOSIS — M5137 Other intervertebral disc degeneration, lumbosacral region: Secondary | ICD-10-CM | POA: Insufficient documentation

## 2019-08-20 DIAGNOSIS — M545 Low back pain, unspecified: Secondary | ICD-10-CM | POA: Insufficient documentation

## 2019-08-20 NOTE — Assessment & Plan Note (Signed)
Unchanged Continue tizanidine and tramadol for now Add medrol PT ordered Referral to Ortho Spine Call if worsening

## 2019-08-20 NOTE — Assessment & Plan Note (Signed)
Noted on xray from 08/06/19 No change in pain despite prednisone taper and meloxicam Will try medrol dose pak Continue tramadol Continue Tizanidine for spasms Moist heat Back exercises printed on AVS Will refer to ortho spine and to Physical therapy for further evaluation Call if worsening

## 2019-08-28 ENCOUNTER — Ambulatory Visit: Payer: Medicare Other | Attending: Family Medicine

## 2019-09-09 ENCOUNTER — Ambulatory Visit (HOSPITAL_COMMUNITY): Payer: Medicare Other | Attending: Physician Assistant | Admitting: Physical Therapy

## 2019-09-20 NOTE — Telephone Encounter (Signed)
DISREGUARD

## 2019-12-11 NOTE — Congregational Nurse Program (Signed)
[facility] BP today was 145/100 in R arm. Pt states she has missed the last few days of Lisinopril 10 mg dosage as prescribed. Has meds on hand. Agrees to restart and take regularly. Co to follow up with MD re: today's abnormal reading and to use this nurse for intermittent rechecks between dr. Visits. Co re imparative nature of this result for med compliance and follow up. Pt verbalizes understanding.

## 2020-01-06 ENCOUNTER — Other Ambulatory Visit: Payer: Self-pay

## 2020-01-06 ENCOUNTER — Ambulatory Visit (INDEPENDENT_AMBULATORY_CARE_PROVIDER_SITE_OTHER): Payer: Medicare Other | Admitting: Family Medicine

## 2020-01-06 ENCOUNTER — Encounter: Payer: Self-pay | Admitting: Family Medicine

## 2020-01-06 VITALS — BP 139/87 | HR 67 | Temp 98.4°F | Resp 18 | Ht 66.0 in | Wt 169.6 lb

## 2020-01-06 DIAGNOSIS — T7491XA Unspecified adult maltreatment, confirmed, initial encounter: Secondary | ICD-10-CM

## 2020-01-06 DIAGNOSIS — I10 Essential (primary) hypertension: Secondary | ICD-10-CM

## 2020-01-06 DIAGNOSIS — F419 Anxiety disorder, unspecified: Secondary | ICD-10-CM | POA: Diagnosis not present

## 2020-01-06 DIAGNOSIS — Z23 Encounter for immunization: Secondary | ICD-10-CM | POA: Insufficient documentation

## 2020-01-06 DIAGNOSIS — E782 Mixed hyperlipidemia: Secondary | ICD-10-CM

## 2020-01-06 DIAGNOSIS — K219 Gastro-esophageal reflux disease without esophagitis: Secondary | ICD-10-CM

## 2020-01-06 DIAGNOSIS — F319 Bipolar disorder, unspecified: Secondary | ICD-10-CM

## 2020-01-06 DIAGNOSIS — Z9189 Other specified personal risk factors, not elsewhere classified: Secondary | ICD-10-CM | POA: Diagnosis not present

## 2020-01-06 MED ORDER — FLUOXETINE HCL 40 MG PO CAPS: 40.0000 mg | ORAL_CAPSULE | Freq: Every morning | ORAL | 0 refills | Status: DC

## 2020-01-06 MED ORDER — HYDROXYZINE PAMOATE 25 MG PO CAPS: 25.0000 mg | ORAL_CAPSULE | Freq: Three times a day (TID) | ORAL | 0 refills | Status: DC | PRN

## 2020-01-06 MED ORDER — PANTOPRAZOLE SODIUM 40 MG PO TBEC: 40.0000 mg | DELAYED_RELEASE_TABLET | Freq: Every day | ORAL | 3 refills | Status: DC

## 2020-01-06 MED ORDER — REXULTI 2 MG PO TABS: ORAL_TABLET | ORAL | 0 refills | Status: DC

## 2020-01-06 MED ORDER — LISINOPRIL 20 MG PO TABS: 20.0000 mg | ORAL_TABLET | Freq: Every day | ORAL | 3 refills | Status: DC

## 2020-01-06 MED ORDER — ATORVASTATIN CALCIUM 40 MG PO TABS: 40.0000 mg | ORAL_TABLET | Freq: Every day | ORAL | 3 refills | Status: DC
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
## 2020-01-06 NOTE — Assessment & Plan Note (Addendum)
Slightly elevated Refill meds, more pressing issues discussed today Will reassess at next visit
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
## 2020-01-06 NOTE — Progress Notes (Signed)
Pt wants flu vaccine.

## 2020-01-06 NOTE — Assessment & Plan Note (Signed)
Poorly controlled, likely 2/2 social situation Holding on med changes until social situation is stabilized

## 2020-01-06 NOTE — Assessment & Plan Note (Signed)
Continue Lipitor Recheck FLP at next visit. More pressing issues discussed today

## 2020-01-06 NOTE — Patient Instructions (Signed)
[email or web address]

## 2020-01-07 ENCOUNTER — Other Ambulatory Visit: Payer: Self-pay | Admitting: Family Medicine

## 2020-01-07 DIAGNOSIS — G4700 Insomnia, unspecified: Secondary | ICD-10-CM

## 2020-01-07 MED ORDER — ALLOPURINOL 100 MG PO TABS: 100.0000 mg | ORAL_TABLET | Freq: Every day | ORAL | 0 refills | Status: DC

## 2020-01-07 MED ORDER — TRAZODONE HCL 100 MG PO TABS: ORAL_TABLET | ORAL | 0 refills | Status: DC

## 2020-01-07 NOTE — Assessment & Plan Note (Signed)
Refilled protonix More pressibg issues discussed today, but will reassess further at next visit
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
## 2020-01-07 NOTE — Telephone Encounter (Signed)
Medication:  traZODone (DESYREL) 100 MG tablet [[id number]]   Has the patient contacted their pharmacy? Yes   (Agent: If yes, when and what did the pharmacy advise?) call the office   Preferred Pharmacy (with phone number or street name):  [facility] [place], [place] - [address]  [address] [place] [address]  Phone: [phone] Fax: [phone]   Agent: Please be advised that RX refills may take up to 3 business days. We ask that you follow-up with your pharmacy.

## 2020-01-07 NOTE — Assessment & Plan Note (Signed)
Contracted for safety No active SI or plan at this time She states that she does not want to hurt herself Need to inquire about substance use at next visit

## 2020-01-07 NOTE — Telephone Encounter (Signed)
Medication:  allopurinol (ZYLOPRIM) 100 MG tablet [[id number]]   Has the patient contacted their pharmacy? Yes   (Agent: If yes, when and what did the pharmacy advise?) call the office   Preferred Pharmacy (with phone number or street name):  [facility] [place], [place] - [address]  [address], [place] [address]  Phone:  [phone] Fax:  [phone]  Agent: Please be advised that RX refills may take up to 3 business days. We ask that you follow-up with your pharmacy.

## 2020-01-09 ENCOUNTER — Telehealth: Payer: Self-pay

## 2020-01-09 NOTE — Telephone Encounter (Signed)
Erroneous Encounter. Please Disregard.

## 2020-01-13 ENCOUNTER — Ambulatory Visit: Payer: Medicare Other | Admitting: Family Medicine
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
## 2020-01-14 ENCOUNTER — Ambulatory Visit: Payer: Medicare Other | Admitting: Family Medicine

## 2020-01-21 ENCOUNTER — Other Ambulatory Visit: Payer: Self-pay

## 2020-01-21 ENCOUNTER — Emergency Department (HOSPITAL_COMMUNITY)
Admission: EM | Admit: 2020-01-21 | Discharge: 2020-01-21 | Disposition: A | Discharge status: Home / Self Care | Payer: Medicare Other | Attending: Emergency Medicine | Admitting: Emergency Medicine

## 2020-01-21 DIAGNOSIS — Z79899 Other long term (current) drug therapy: Secondary | ICD-10-CM | POA: Insufficient documentation

## 2020-01-21 DIAGNOSIS — F17210 Nicotine dependence, cigarettes, uncomplicated: Secondary | ICD-10-CM | POA: Insufficient documentation

## 2020-01-21 DIAGNOSIS — Z7982 Long term (current) use of aspirin: Secondary | ICD-10-CM | POA: Diagnosis not present

## 2020-01-21 DIAGNOSIS — Z23 Encounter for immunization: Secondary | ICD-10-CM | POA: Insufficient documentation

## 2020-01-21 DIAGNOSIS — S00401A Unspecified superficial injury of right ear, initial encounter: Secondary | ICD-10-CM | POA: Diagnosis present

## 2020-01-21 DIAGNOSIS — S01311A Laceration without foreign body of right ear, initial encounter: Secondary | ICD-10-CM | POA: Insufficient documentation

## 2020-01-21 DIAGNOSIS — W19XXXA Unspecified fall, initial encounter: Secondary | ICD-10-CM | POA: Insufficient documentation

## 2020-01-21 LAB — CBC
HCT: 39.8 % (ref 36.0–46.0)
Hemoglobin: 13.2 g/dL (ref 12.0–15.0)
MCH: 29.1 pg (ref 26.0–34.0)
MCHC: 33.2 g/dL (ref 30.0–36.0)
MCV: 87.9 fL (ref 80.0–100.0)
Platelets: 322 10*3/uL (ref 150–400)
RBC: 4.53 MIL/uL (ref 3.87–5.11)
RDW: 13.2 % (ref 11.5–15.5)
WBC: 12.2 10*3/uL — ABNORMAL HIGH (ref 4.0–10.5)
nRBC: 0 % (ref 0.0–0.2)

## 2020-01-21 LAB — BASIC METABOLIC PANEL
Anion gap: 10 (ref 5–15)
BUN: 9 mg/dL (ref 8–23)
CO2: 26 mmol/L (ref 22–32)
Calcium: 8.9 mg/dL (ref 8.9–10.3)
Chloride: 103 mmol/L (ref 98–111)
Creatinine, Ser: 0.65 mg/dL (ref 0.44–1.00)
GFR, Estimated: >60 mL/min (ref >60)
Glucose, Bld: 106 mg/dL — ABNORMAL HIGH (ref 70–99)
Potassium: 3.8 mmol/L (ref 3.5–5.1)
Sodium: 139 mmol/L (ref 135–145)

## 2020-01-21 MED ORDER — TETANUS-DIPHTH-ACELL PERTUSSIS 5-2.5-18.5 LF-MCG/0.5 IM SUSY
0.5000 mL | PREFILLED_SYRINGE | Freq: Once | INTRAMUSCULAR | Status: AC
  Administered 2020-01-21: 12:00:00 0.5 mL via INTRAMUSCULAR
  Filled 2020-01-21: qty 0.5

## 2020-01-21 MED ORDER — BACITRACIN-NEOMYCIN-POLYMYXIN 400-5-5000 EX OINT
TOPICAL_OINTMENT | Freq: Once | CUTANEOUS | Status: AC
  Administered 2020-01-21: 1 via TOPICAL
  Filled 2020-01-21: qty 1

## 2020-01-21 MED ORDER — LIDOCAINE-EPINEPHRINE-TETRACAINE (LET) TOPICAL GEL
3.0000 mL | Freq: Once | TOPICAL | Status: AC
  Administered 2020-01-21: 12:00:00 3 mL via TOPICAL
  Filled 2020-01-21: qty 3

## 2020-01-21 NOTE — ED Triage Notes (Signed)
Pt reports a fall this morning with rt ear laceration after taking medication that made her dizzy. Pt reports she did not hit her head and does not take blood thinners.

## 2020-01-21 NOTE — Discharge Instructions (Addendum)
Keep your wound covered and dry for the next 24 hours.  You may use mild soap and water wash starting tomorrow, but dry the wound completely to avoid softening the developed scab.  It is ok to apply antibiotic ointment as discussed.  Have your sutures removed in 7 days.
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
## 2020-01-31 ENCOUNTER — Ambulatory Visit: Payer: Self-pay | Admitting: Family Medicine

## 2020-02-03 ENCOUNTER — Ambulatory Visit: Payer: Medicare Other | Admitting: Family Medicine

## 2020-02-13 ENCOUNTER — Telehealth: Payer: Self-pay | Admitting: Family Medicine

## 2020-02-13 NOTE — Telephone Encounter (Signed)
Copied from CRM [id number]. Topic: Medicare AWV >> Feb 13, 2020 12:22 PM Claudette Laws R wrote: Reason for CRM:  Left message for patient to call back and schedule Medicare Annual Wellness Visit (AWV) in office.   If not able to come in office, please offer to do virtually or by telephone.   Last AWV 05/01/2017  Please schedule at anytime with [facility] Health Advisor.  If any questions, please contact me at [phone]

## 2020-03-24 ENCOUNTER — Emergency Department (HOSPITAL_COMMUNITY)
Admission: EM | Admit: 2020-03-24 | Discharge: 2020-03-24 | Disposition: A | Discharge status: Home / Self Care | Payer: Medicare Other | Attending: Emergency Medicine | Admitting: Emergency Medicine

## 2020-03-24 ENCOUNTER — Encounter (HOSPITAL_COMMUNITY): Payer: Self-pay

## 2020-03-24 ENCOUNTER — Other Ambulatory Visit: Payer: Self-pay

## 2020-03-24 ENCOUNTER — Emergency Department (HOSPITAL_COMMUNITY): Payer: Medicare Other

## 2020-03-24 DIAGNOSIS — Z743 Need for continuous supervision: Secondary | ICD-10-CM | POA: Diagnosis not present

## 2020-03-24 DIAGNOSIS — S59901A Unspecified injury of right elbow, initial encounter: Secondary | ICD-10-CM | POA: Diagnosis not present

## 2020-03-24 DIAGNOSIS — Z7982 Long term (current) use of aspirin: Secondary | ICD-10-CM | POA: Diagnosis not present

## 2020-03-24 DIAGNOSIS — S7001XA Contusion of right hip, initial encounter: Secondary | ICD-10-CM

## 2020-03-24 DIAGNOSIS — Z79899 Other long term (current) drug therapy: Secondary | ICD-10-CM | POA: Insufficient documentation

## 2020-03-24 DIAGNOSIS — R6889 Other general symptoms and signs: Secondary | ICD-10-CM | POA: Diagnosis not present

## 2020-03-24 DIAGNOSIS — M25551 Pain in right hip: Secondary | ICD-10-CM | POA: Diagnosis not present

## 2020-03-24 DIAGNOSIS — M7989 Other specified soft tissue disorders: Secondary | ICD-10-CM | POA: Diagnosis not present

## 2020-03-24 DIAGNOSIS — R0789 Other chest pain: Secondary | ICD-10-CM | POA: Insufficient documentation

## 2020-03-24 DIAGNOSIS — S20212A Contusion of left front wall of thorax, initial encounter: Secondary | ICD-10-CM | POA: Diagnosis not present

## 2020-03-24 DIAGNOSIS — F17210 Nicotine dependence, cigarettes, uncomplicated: Secondary | ICD-10-CM | POA: Insufficient documentation

## 2020-03-24 DIAGNOSIS — R52 Pain, unspecified: Secondary | ICD-10-CM | POA: Diagnosis not present

## 2020-03-24 DIAGNOSIS — S50311A Abrasion of right elbow, initial encounter: Secondary | ICD-10-CM | POA: Insufficient documentation

## 2020-03-24 DIAGNOSIS — I10 Essential (primary) hypertension: Secondary | ICD-10-CM | POA: Insufficient documentation

## 2020-03-24 DIAGNOSIS — R609 Edema, unspecified: Secondary | ICD-10-CM | POA: Diagnosis not present

## 2020-03-24 DIAGNOSIS — S2231XD Fracture of one rib, right side, subsequent encounter for fracture with routine healing: Secondary | ICD-10-CM | POA: Diagnosis not present

## 2020-03-24 DIAGNOSIS — S5001XA Contusion of right elbow, initial encounter: Secondary | ICD-10-CM | POA: Diagnosis not present

## 2020-03-24 NOTE — Discharge Instructions (Addendum)
Wear the sling for comfort on your right arm.  Follow-up with orthopedics information provided above.  X-rays of the right elbow showed no bony injury.  X-ray of your chest and ribs showed no bony injuries or abnormalities in the lungs.  X-ray of your right hip and pelvis showed no bony abnormalities.  So no evidence of any fractures.  But you do have soft tissue contusions obviously with an abrasion on the elbow wash with soap and water.  And expect to be sore and stiff for the next few days.  Return for any new or worse symptoms

## 2020-03-24 NOTE — ED Notes (Signed)
Sling shoulder immobilizer applied to right arm, sensation and movement present in fingers post application.
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
## 2020-03-27 ENCOUNTER — Ambulatory Visit: Payer: Medicare Other | Admitting: Family Medicine

## 2020-03-27 NOTE — Progress Notes (Deleted)
      Established patient visit   Patient: Marisa Sherman   DOB: 03-30-1960   60 y.o. Female  MRN: [id number] Visit Date: 03/27/2020  Today's healthcare provider: Shirlee Latch, MD   No chief complaint on file.  Subjective    HPI  ***  {Show patient history (optional):23778::" "}   Medications: Outpatient Medications Prior to Visit  Medication Sig  . allopurinol (ZYLOPRIM) 100 MG tablet Take 1 tablet (100 mg total) by mouth daily.  Marland Kitchen aspirin EC 81 MG tablet Take 81 mg by mouth daily.  Marland Kitchen atorvastatin (LIPITOR) 40 MG tablet Take 1 tablet (40 mg total) by mouth daily at 6 PM.  . FLUoxetine (PROZAC) 40 MG capsule Take 1 capsule (40 mg total) by mouth every morning.  . hydrOXYzine (VISTARIL) 25 MG capsule Take 1 capsule (25 mg total) by mouth every 8 (eight) hours as needed for anxiety.  Marland Kitchen lisinopril (ZESTRIL) 20 MG tablet Take 1 tablet (20 mg total) by mouth daily.  . meloxicam (MOBIC) 15 MG tablet Take 1 tablet (15 mg total) by mouth daily.  . pantoprazole (PROTONIX) 40 MG tablet Take 1 tablet (40 mg total) by mouth daily.  Marland Kitchen REXULTI 2 MG TABS tablet TAKE 1 TABLET (2 MG) BY MOUTH AT BEDTIME.  . traZODone (DESYREL) 100 MG tablet TAKE 2 TABLETS BY MOUTH AT BEDTIME AS NEEDED SLEEP   No facility-administered medications prior to visit.    Review of Systems  {Labs  Heme  Chem  Endocrine  Serology  Results Review (optional):23779::" "}   Objective    There were no vitals taken for this visit. {Show previous vital signs (optional):23777::" "}   Physical Exam  ***  No results found for any visits on 03/27/20.  Assessment & Plan     ***  No follow-ups on file.      {provider attestation***:1}   Shirlee Latch, MD  [facility] [phone] (phone) [phone] (fax)  [facility]

## 2020-03-30 ENCOUNTER — Telehealth: Payer: Self-pay

## 2020-03-30 DIAGNOSIS — G4700 Insomnia, unspecified: Secondary | ICD-10-CM

## 2020-03-30 DIAGNOSIS — F419 Anxiety disorder, unspecified: Secondary | ICD-10-CM
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
## 2020-03-30 NOTE — Telephone Encounter (Signed)
Ok to send chronic med refills to whatever pharmacy she would like to use.

## 2020-03-31 MED ORDER — REXULTI 2 MG PO TABS: ORAL_TABLET | ORAL | 0 refills | Status: DC

## 2020-03-31 MED ORDER — LISINOPRIL 20 MG PO TABS: 20.0000 mg | ORAL_TABLET | Freq: Every day | ORAL | 3 refills | Status: DC

## 2020-03-31 MED ORDER — ATORVASTATIN CALCIUM 40 MG PO TABS: 40.0000 mg | ORAL_TABLET | Freq: Every day | ORAL | 3 refills | Status: DC

## 2020-03-31 MED ORDER — FLUOXETINE HCL 40 MG PO CAPS: 40.0000 mg | ORAL_CAPSULE | Freq: Every morning | ORAL | 0 refills | Status: DC

## 2020-03-31 MED ORDER — ALLOPURINOL 100 MG PO TABS: 100.0000 mg | ORAL_TABLET | Freq: Every day | ORAL | 0 refills | Status: DC

## 2020-03-31 MED ORDER — HYDROXYZINE PAMOATE 25 MG PO CAPS: 25.0000 mg | ORAL_CAPSULE | Freq: Three times a day (TID) | ORAL | 0 refills | Status: DC | PRN

## 2020-03-31 MED ORDER — MELOXICAM 15 MG PO TABS: 15.0000 mg | ORAL_TABLET | Freq: Every day | ORAL | 0 refills | Status: DC

## 2020-03-31 MED ORDER — PANTOPRAZOLE SODIUM 40 MG PO TBEC: 40.0000 mg | DELAYED_RELEASE_TABLET | Freq: Every day | ORAL | 3 refills | Status: DC

## 2020-03-31 MED ORDER — TRAZODONE HCL 100 MG PO TABS: ORAL_TABLET | ORAL | 0 refills | Status: DC

## 2020-03-31 NOTE — Telephone Encounter (Signed)
Please send medication to [facility] on [address] in [place] phone number [phone]

## 2020-03-31 NOTE — Telephone Encounter (Signed)
Pt called back again to check on the status of all her medications, Pt informed of medication turnaround time of 48-72 hours.

## 2020-04-07 ENCOUNTER — Ambulatory Visit: Payer: Medicaid Other | Admitting: Family Medicine

## 2020-04-09 ENCOUNTER — Encounter: Payer: Self-pay | Admitting: Family Medicine

## 2020-04-09 ENCOUNTER — Ambulatory Visit (INDEPENDENT_AMBULATORY_CARE_PROVIDER_SITE_OTHER): Payer: Medicare Other | Admitting: Family Medicine

## 2020-04-09 ENCOUNTER — Other Ambulatory Visit: Payer: Self-pay

## 2020-04-09 VITALS — BP 108/73 | HR 80 | Temp 98.2°F | Resp 18 | Wt 187.0 lb

## 2020-04-09 DIAGNOSIS — I10 Essential (primary) hypertension: Secondary | ICD-10-CM

## 2020-04-09 DIAGNOSIS — R0683 Snoring: Secondary | ICD-10-CM | POA: Diagnosis not present

## 2020-04-09 DIAGNOSIS — E782 Mixed hyperlipidemia: Secondary | ICD-10-CM

## 2020-04-09 DIAGNOSIS — G478 Other sleep disorders: Secondary | ICD-10-CM | POA: Diagnosis not present

## 2020-04-09 DIAGNOSIS — D72829 Elevated white blood cell count, unspecified: Secondary | ICD-10-CM | POA: Diagnosis not present

## 2020-04-09 DIAGNOSIS — E669 Obesity, unspecified: Secondary | ICD-10-CM

## 2020-04-09 DIAGNOSIS — T7491XD Unspecified adult maltreatment, confirmed, subsequent encounter: Secondary | ICD-10-CM | POA: Diagnosis not present

## 2020-04-09 DIAGNOSIS — R5382 Chronic fatigue, unspecified: Secondary | ICD-10-CM

## 2020-04-09 DIAGNOSIS — F1911 Other psychoactive substance abuse, in remission: Secondary | ICD-10-CM

## 2020-04-09 DIAGNOSIS — Z6830 Body mass index (BMI) 30.0-30.9, adult: Secondary | ICD-10-CM

## 2020-04-09 NOTE — Patient Instructions (Signed)
Continue current medications - good compliance Social work will reach out to you by phone They will also call to schedule sleep study at [facility] labs at [facility] fasting one morning when possible
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
## 2020-04-10 ENCOUNTER — Telehealth: Payer: Self-pay | Admitting: *Deleted

## 2020-04-10 ENCOUNTER — Encounter: Payer: Self-pay | Admitting: Family Medicine

## 2020-04-10 DIAGNOSIS — R5382 Chronic fatigue, unspecified: Secondary | ICD-10-CM | POA: Insufficient documentation

## 2020-04-10 DIAGNOSIS — F1911 Other psychoactive substance abuse, in remission: Secondary | ICD-10-CM | POA: Insufficient documentation

## 2020-04-10 DIAGNOSIS — G478 Other sleep disorders: Secondary | ICD-10-CM | POA: Insufficient documentation

## 2020-04-10 DIAGNOSIS — Z6830 Body mass index (BMI) 30.0-30.9, adult: Secondary | ICD-10-CM | POA: Insufficient documentation

## 2020-04-10 DIAGNOSIS — E669 Obesity, unspecified: Secondary | ICD-10-CM | POA: Insufficient documentation

## 2020-04-10 NOTE — Assessment & Plan Note (Signed)
Discussed importance of healthy weight management Discussed diet and exercise  

## 2020-04-10 NOTE — Assessment & Plan Note (Signed)
Previously well controlled Continue statin Repeat FLP and CMP  

## 2020-04-10 NOTE — Assessment & Plan Note (Signed)
Well controlled Continue current medications Recheck metabolic panel F/u in 6 months  
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
## 2020-04-10 NOTE — Assessment & Plan Note (Signed)
Patient with a history of crack cocaine use She has been clean for 9 days She will be followed by [facility] for her recovery process Congratulated her on her sobriety and encouraged to continue

## 2020-04-10 NOTE — Chronic Care Management (AMB) (Signed)
  Chronic Care Management   Outreach Note  04/10/2020 Name: Jeydi Klingel MRN: [id number] DOB: 12/30/1960  Novice Vrba is a 60 y.o. year old female who is a primary care patient of Bacigalupo, Marzella Schlein, MD. I reached out to [employer] by phone today in response to a referral sent by Ms. Tanequa Anello's PCP, Dr. Beryle Flock.     An unsuccessful telephone outreach was attempted today. The patient was referred to the case management team for assistance with care management and care coordination.   Follow Up Plan: A HIPAA compliant phone message was left for the patient providing contact information and requesting a return call. The care management team will reach out to the patient again over the next 3 days. If patient returns call to provider office, please advise to call Embedded Care Management Care Guide Gwenevere Ghazi at [phone].  Gwenevere Ghazi  Care Guide, Embedded Care Coordination [facility] Management

## 2020-04-10 NOTE — Assessment & Plan Note (Signed)
Longstanding and likely multifactorial We will check some labs today, as well as sleep study above Working on mood disorder may also help

## 2020-04-10 NOTE — Assessment & Plan Note (Signed)
Ongoing for a long time, but newly discussed today This, in the setting of snoring and obesity, is concerning for possible sleep apnea that may contribute to her chronic fatigue Baseline PSG sleep study ordered today

## 2020-04-13 DIAGNOSIS — E782 Mixed hyperlipidemia: Secondary | ICD-10-CM | POA: Diagnosis not present

## 2020-04-13 DIAGNOSIS — R5382 Chronic fatigue, unspecified: Secondary | ICD-10-CM | POA: Diagnosis not present

## 2020-04-13 DIAGNOSIS — D72829 Elevated white blood cell count, unspecified: Secondary | ICD-10-CM | POA: Diagnosis not present

## 2020-04-13 NOTE — Chronic Care Management (AMB) (Signed)
  Chronic Care Management   Note  04/13/2020 Name: Marisa Sherman MRN: [id number] DOB: 10/25/1960  Marisa Sherman is a 60 y.o. year old female who is a primary care patient of Bacigalupo, Dionne Bucy, MD. I reached out to [employer] by phone today in response to a referral sent by Marisa Sherman's PCP, Dr. Brita Romp     Marisa Sherman was given information about Chronic Care Management services today including:  1. CCM service includes personalized support from designated clinical staff supervised by her physician, including individualized plan of care and coordination with other care providers 2. 24/7 contact phone numbers for assistance for urgent and routine care needs. 3. Service will only be billed when office clinical staff spend 20 minutes or more in a month to coordinate care. 4. Only one practitioner may furnish and bill the service in a calendar month. 5. The patient may stop CCM services at any time (effective at the end of the month) by phone call to the office staff. 6. The patient will be responsible for cost sharing (co-pay) of up to 20% of the service fee (after annual deductible is met).  Patient agreed to services and verbal consent obtained.   Follow up plan: Telephone appointment with care management team member scheduled for:04/14/2020  [facility] [place] [facility]

## 2020-04-14 ENCOUNTER — Telehealth: Payer: Self-pay | Admitting: *Deleted

## 2020-04-14 ENCOUNTER — Telehealth: Payer: Self-pay

## 2020-04-14 ENCOUNTER — Telehealth: Payer: Medicaid Other | Admitting: *Deleted

## 2020-04-14 LAB — CBC WITH DIFFERENTIAL/PLATELET
Basophils Absolute: 0 10*3/uL (ref 0.0–0.2)
Basos: 1 %
EOS (ABSOLUTE): 0.2 10*3/uL (ref 0.0–0.4)
Eos: 2 %
Hematocrit: 42 % (ref 34.0–46.6)
Hemoglobin: 13.7 g/dL (ref 11.1–15.9)
Immature Grans (Abs): 0 10*3/uL (ref 0.0–0.1)
Immature Granulocytes: 0 %
Lymphocytes Absolute: 2.6 10*3/uL (ref 0.7–3.1)
Lymphs: 30 %
MCH: 29.2 pg (ref 26.6–33.0)
MCHC: 32.6 g/dL (ref 31.5–35.7)
MCV: 90 fL (ref 79–97)
Monocytes Absolute: 0.6 10*3/uL (ref 0.1–0.9)
Monocytes: 7 %
Neutrophils Absolute: 5.2 10*3/uL (ref 1.4–7.0)
Neutrophils: 60 %
Platelets: 284 10*3/uL (ref 150–450)
RBC: 4.69 x10E6/uL (ref 3.77–5.28)
RDW: 12.8 % (ref 11.7–15.4)
WBC: 8.7 10*3/uL (ref 3.4–10.8)

## 2020-04-14 LAB — LIPID PANEL
Chol/HDL Ratio: 4 ratio (ref 0.0–4.4)
Cholesterol, Total: 165 mg/dL (ref 100–199)
HDL: 41 mg/dL (ref >39)
LDL Chol Calc (NIH): 98 mg/dL (ref 0–99)
Triglycerides: 147 mg/dL (ref 0–149)
VLDL Cholesterol Cal: 26 mg/dL (ref 5–40)

## 2020-04-14 LAB — VITAMIN D 25 HYDROXY (VIT D DEFICIENCY, FRACTURES): Vit D, 25-Hydroxy: 16.5 ng/mL — ABNORMAL LOW (ref 30.0–100.0)

## 2020-04-14 LAB — HEPATIC FUNCTION PANEL
ALT: 17 IU/L (ref 0–32)
AST: 15 IU/L (ref 0–40)
Albumin: 4.5 g/dL (ref 3.8–4.8)
Alkaline Phosphatase: 97 IU/L (ref 44–121)
Bilirubin Total: 0.2 mg/dL (ref 0.0–1.2)
Bilirubin, Direct: <0.1 mg/dL (ref 0.00–0.40)
Total Protein: 7.3 g/dL (ref 6.0–8.5)

## 2020-04-14 LAB — VITAMIN B12: Vitamin B-12: 417 pg/mL (ref 232–1245)

## 2020-04-14 LAB — TSH: TSH: 2.61 u[IU]/mL (ref 0.450–4.500)

## 2020-04-14 NOTE — Telephone Encounter (Signed)
  Chronic Care Management   Outreach Note  04/14/2020 Name: Marisa Sherman MRN: [id number] DOB: 20-Sep-1960  Referred by: Erasmo Downer, MD Reason for referral : Chronic Care Management   An unsuccessful telephone outreach was attempted today. The patient was referred to the case management team for assistance with care management and care coordination.   Follow Up Plan: Telephone follow up appointment with care management team will be scheduled by the the care guide   Chrystal Land, LCSW Clinical Social Worker  [facility] Care Management [phone]

## 2020-04-14 NOTE — Telephone Encounter (Signed)
Patient was advised and verbalized understanding. 

## 2020-04-14 NOTE — Telephone Encounter (Signed)
-----   Message from Erasmo Downer, MD sent at 04/14/2020 11:58 AM EDT ----- Normal labs, except lVit D level is low.  Recommend starting OTC Vit D3 1000-2000 units daily.

## 2020-04-17 ENCOUNTER — Encounter: Payer: Self-pay | Admitting: *Deleted

## 2020-04-17 ENCOUNTER — Telehealth: Payer: Self-pay | Admitting: *Deleted

## 2020-04-17 NOTE — Chronic Care Management (AMB) (Signed)
  Care Management   Note  04/17/2020 Name: Marisa Sherman MRN: [id number] DOB: Jul 13, 1960  Marisa Sherman is a 60 y.o. year old female who is a primary care patient of Beryle Flock, Marzella Schlein, MD and is actively engaged with the care management team. I reached out to [name] by phone today to assist with re-scheduling an initial visit with the Licensed Clinical Social Worker  Follow up plan: Patient declines further follow up and engagement by the care management team. Appropriate care team members and provider have been notified via electronic communication.   Gwenevere Ghazi  Care Guide, Embedded Care Coordination [facility] Management

## 2020-04-17 NOTE — Telephone Encounter (Signed)
This encounter was created in error - please disregard.

## 2020-06-18 ENCOUNTER — Ambulatory Visit (HOSPITAL_COMMUNITY): Payer: Medicaid Other | Admitting: Psychiatry

## 2020-07-06 ENCOUNTER — Other Ambulatory Visit: Payer: Self-pay

## 2020-07-06 ENCOUNTER — Telehealth: Payer: Self-pay | Admitting: Family Medicine

## 2020-07-06 DIAGNOSIS — F419 Anxiety disorder, unspecified: Secondary | ICD-10-CM

## 2020-07-06 DIAGNOSIS — G4700 Insomnia, unspecified: Secondary | ICD-10-CM

## 2020-07-06 MED ORDER — PANTOPRAZOLE SODIUM 40 MG PO TBEC: 40.0000 mg | DELAYED_RELEASE_TABLET | Freq: Every day | ORAL | 3 refills | Status: DC

## 2020-07-06 MED ORDER — REXULTI 2 MG PO TABS: ORAL_TABLET | ORAL | 0 refills | Status: DC

## 2020-07-06 MED ORDER — ALLOPURINOL 100 MG PO TABS: 100.0000 mg | ORAL_TABLET | Freq: Every day | ORAL | 0 refills | Status: DC

## 2020-07-06 MED ORDER — FLUOXETINE HCL 40 MG PO CAPS: 40.0000 mg | ORAL_CAPSULE | Freq: Every morning | ORAL | 0 refills | Status: DC

## 2020-07-06 MED ORDER — ATORVASTATIN CALCIUM 40 MG PO TABS: 40.0000 mg | ORAL_TABLET | Freq: Every day | ORAL | 0 refills | Status: DC

## 2020-07-06 MED ORDER — TRAZODONE HCL 100 MG PO TABS: ORAL_TABLET | ORAL | 0 refills | Status: DC

## 2020-07-06 MED ORDER — LISINOPRIL 20 MG PO TABS: 20.0000 mg | ORAL_TABLET | Freq: Every day | ORAL | 0 refills | Status: DC

## 2020-07-06 MED ORDER — HYDROXYZINE PAMOATE 25 MG PO CAPS: 25.0000 mg | ORAL_CAPSULE | Freq: Three times a day (TID) | ORAL | 0 refills | Status: DC | PRN

## 2020-07-06 NOTE — Telephone Encounter (Signed)
Medication Refill - Medication:  pantoprazole (PROTONIX) 40 MG tablet  REXULTI 2 MG TABS tablet traZODone (DESYREL) 100 MG tablet  allopurinol (ZYLOPRIM) 100 MG tablet  atorvastatin (LIPITOR) 40 MG tablet  FLUoxetine (PROZAC) 40 MG capsule  hydrOXYzine (VISTARIL) 25 MG capsule  lisinopril (ZESTRIL) 20 MG tablet   Has the patient contacted their pharmacy? No. No more refills left  Preferred Pharmacy (with phone number or street name): [phone] (pt did not know any of the pharmacy information.   Agent: Please be advised that RX refills may take up to 3 business days. We ask that you follow-up with your pharmacy.

## 2020-07-08 ENCOUNTER — Ambulatory Visit (INDEPENDENT_AMBULATORY_CARE_PROVIDER_SITE_OTHER): Payer: Medicare Other | Admitting: Licensed Clinical Social Worker

## 2020-07-08 ENCOUNTER — Other Ambulatory Visit: Payer: Self-pay

## 2020-07-08 ENCOUNTER — Encounter (HOSPITAL_COMMUNITY): Payer: Self-pay | Admitting: Licensed Clinical Social Worker

## 2020-07-08 DIAGNOSIS — F4310 Post-traumatic stress disorder, unspecified: Secondary | ICD-10-CM

## 2020-07-08 DIAGNOSIS — F1994 Other psychoactive substance use, unspecified with psychoactive substance-induced mood disorder: Secondary | ICD-10-CM | POA: Insufficient documentation
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
## 2020-07-20 ENCOUNTER — Other Ambulatory Visit: Payer: Self-pay

## 2020-07-20 ENCOUNTER — Ambulatory Visit (INDEPENDENT_AMBULATORY_CARE_PROVIDER_SITE_OTHER): Payer: Medicare Other | Admitting: Psychiatry

## 2020-07-20 ENCOUNTER — Encounter (HOSPITAL_COMMUNITY): Payer: Self-pay | Admitting: Psychiatry

## 2020-07-20 DIAGNOSIS — F1994 Other psychoactive substance use, unspecified with psychoactive substance-induced mood disorder: Secondary | ICD-10-CM

## 2020-07-20 DIAGNOSIS — F1421 Cocaine dependence, in remission: Secondary | ICD-10-CM

## 2020-07-20 DIAGNOSIS — F419 Anxiety disorder, unspecified: Secondary | ICD-10-CM

## 2020-07-20 DIAGNOSIS — F4310 Post-traumatic stress disorder, unspecified: Secondary | ICD-10-CM | POA: Diagnosis not present

## 2020-07-20 MED ORDER — HYDROXYZINE PAMOATE 25 MG PO CAPS: 25.0000 mg | ORAL_CAPSULE | Freq: Three times a day (TID) | ORAL | 0 refills | Status: DC | PRN

## 2020-07-20 MED ORDER — QUETIAPINE FUMARATE 100 MG PO TABS: 100.0000 mg | ORAL_TABLET | Freq: Every day | ORAL | 0 refills | Status: DC

## 2020-07-20 MED ORDER — FLUOXETINE HCL 40 MG PO CAPS: 40.0000 mg | ORAL_CAPSULE | Freq: Every morning | ORAL | 0 refills | Status: DC
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
## 2020-07-25 DIAGNOSIS — K921 Melena: Secondary | ICD-10-CM | POA: Diagnosis not present

## 2020-07-25 DIAGNOSIS — R1013 Epigastric pain: Secondary | ICD-10-CM | POA: Diagnosis not present

## 2020-08-12 ENCOUNTER — Telehealth: Payer: Self-pay | Admitting: *Deleted

## 2020-08-12 NOTE — Chronic Care Management (AMB) (Signed)
  Chronic Care Management   Note  08/12/2020 Name: Marisa Sherman MRN: [id number] DOB: 03-19-1960  Marisa Sherman is a 60 y.o. year old female who is a primary care patient of Bacigalupo, Dionne Bucy, MD. I reached out to [employer] by phone today in response to a referral sent by Ms. Janelys Pennywell's PCP Bacigalupo, Dionne Bucy, MD     Ms. Volpi was given information about Chronic Care Management services today including:  CCM service includes personalized support from designated clinical staff supervised by her physician, including individualized plan of care and coordination with other care providers 24/7 contact phone numbers for assistance for urgent and routine care needs. Service will only be billed when office clinical staff spend 20 minutes or more in a month to coordinate care. Only one practitioner may furnish and bill the service in a calendar month. The patient may stop CCM services at any time (effective at the end of the month) by phone call to the office staff. The patient will be responsible for cost sharing (co-pay) of up to 20% of the service fee (after annual deductible is met).  Patient agreed to services and verbal consent obtained.   Follow up plan: Telephone appointment with care management team member scheduled for: 08/25/2020  Julian Hy, [place], [place] Management  Direct Dial: [phone]

## 2020-08-25 ENCOUNTER — Ambulatory Visit (INDEPENDENT_AMBULATORY_CARE_PROVIDER_SITE_OTHER): Payer: Medicare Other

## 2020-08-25 DIAGNOSIS — I10 Essential (primary) hypertension: Secondary | ICD-10-CM

## 2020-08-25 DIAGNOSIS — E782 Mixed hyperlipidemia: Secondary | ICD-10-CM

## 2020-08-25 DIAGNOSIS — K219 Gastro-esophageal reflux disease without esophagitis: Secondary | ICD-10-CM
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
## 2020-08-25 NOTE — Patient Instructions (Addendum)
Thank you for allowing the Chronic Care Management team to participate in your care.   Goals Addressed: Patient Care Plan: Hypertension and Hyperlipidemia     Problem Identified: Hypertension and Hyperlipidemia      Long-Range Goal: Hypertension and Hyperlipidemia Monitored   Start Date: 08/25/2020  Expected End Date: 12/23/2020  Priority: Medium  Note:   Objective:  Last practice recorded BP readings:  BP Readings from Last 3 Encounters:  04/09/20 108/73  03/24/20 (!) 154/100  01/21/20 120/80   Most recent eGFR/CrCl: No results found for: EGFR  No components found for: CRCL  Lab Results  Component Value Date   CHOL 165 04/13/2020   HDL 41 04/13/2020   LDLCALC 98 04/13/2020   TRIG 147 04/13/2020   CHOLHDL 4.0 04/13/2020     Current Barriers:  Chronic Disease Management support and educational needs related to Hypertension and Hyperlipidemia.  Case Manager Clinical Goal(s):  Over the next 120 days, patient will demonstrate improved health management independence as evidenced by monitoring blood pressure, taking all medications as prescribe, and adhering to a low sodium diet.  Interventions:  Collaboration with Brita Romp Dionne Bucy, MD regarding development and update of comprehensive plan of care as evidenced by provider attestation and co-signature Inter-disciplinary care team collaboration (see longitudinal plan of care) Reviewed medications and importance of compliance. Advised to continue taking as prescribed. Advised to notify provider if unable to tolerate regimen. Encouraged to update the care management team with concerns regarding medication management and prescription cost. Reviewed established BP parameters and indications for notifying a provider. Reports monitoring BP routinely. Advised to monitor at least three times a week if unable to monitor daily and record readings. Discussed nutritional intake and importance of complying with a DASH/low sodium diet. Advised  to continue monitoring sodium intake and avoid highly processed food when possible. Advised to notify the care management team if nutrition assistance is required. Reviewed complications related to uncontrolled blood pressure. Reviewed s/sx of heart, stroke and worsening symptoms that require immediate medical attention.   Self-Care/Patient Goals: Self administer medications as prescribed Attend all scheduled provider appointments Monitor BP and record readings Adhere to a low sodium diet/DASH diet Contact the clinic or care management team with questions and new concerns as needed  Follow Up Plan:  Will follow up in two months    Patient Care Plan: GERD     Problem Identified: Gastroesphageal Reflux Disease      Long-Range Goal: GERD Monitored   Start Date: 08/25/2020  Expected End Date: 11/23/2020  Priority: High  Note:   Current Barriers:  Chronic Disease Management support and educational needs related to GERD self management.  Clinical Goal(s):  Over  the next 90 days patient will demonstrate improved adherence to treatment plan as evidenced by: taking medications as prescribed and adjusting doses as advised by the Gastroenterology team, adhering to recommended diet, and completing follow up with the Gastroenterology team as scheduled.    Interventions:  Evaluation of current treatment plan related to self-management and patient's adherence to plan as established by provider. Collaboration with Virginia Crews, MD regarding development and update of comprehensive plan of care as evidenced by provider attestation and co-signature. Reviewed medications and compliance with treatment plan. Advised to continue taking medications as prescribed and adjusting as advised by the Endocrinology team. Currently holding protonix until the next Endocrinology visit. Discussed nutritional intake. Continues to experience abdominal discomfort.  She was recently evaluated in the urgent  care/walk-in clinic. Denies episodes of nausea  today. Reports tolerating small meals and maintaining adequate hydration. Discussed s/sx of gastrointestinal complications and indications for seeking immediate medical attention.    Self Care/Patient Goals: Self administer medications as prescribed Hold protonix as advised by the Endocrinology team until instructed to resume Seek immediate medical attention for worsening symptoms Calls provider office for new concerns or questions   Follow Up Plan:  Will follow up in two months       Ms. Templeman verbalized understanding of the information discussed during the telephonic outreach. Declined need for mailed/printed instructions. A member of the care management team will follow up in two months.    [facility] Care Management [facility] [phone]

## 2020-09-07 ENCOUNTER — Other Ambulatory Visit: Payer: Self-pay | Admitting: Family Medicine

## 2020-09-07 DIAGNOSIS — G4700 Insomnia, unspecified: Secondary | ICD-10-CM

## 2020-09-07 DIAGNOSIS — F1994 Other psychoactive substance use, unspecified with psychoactive substance-induced mood disorder: Secondary | ICD-10-CM

## 2020-09-07 DIAGNOSIS — F4310 Post-traumatic stress disorder, unspecified: Secondary | ICD-10-CM

## 2020-09-07 NOTE — Telephone Encounter (Signed)
Patient has upcoming appointment with other PCP not at this practice. TC to patient-left VM to return call-need to identify who is her PCP at this time. Upcoming appointment with other PCP on 09/23/20. TC to pharmacy to verify she has enough supply of these medications until that appointment-pharmacy verified she should. Informed pharmacy to send Prozac request to other prescribing physician, Dr. Evelene Croon. Refusing all at this time-patient has received all these medications requested on 07/08/20 #90 tabs per pharmacy.

## 2020-09-07 NOTE — Telephone Encounter (Signed)
Patient has upcoming visit with other PCP on 09/23/20 Dr. Loreta Ave at [facility]. TC to patient to verify PCP-left VM. TC to pharmacy to verify if she has enough medication to last until [place]  Visit on 09/23/20. Pharmacy verified she should have enough of these medications thru 10/08/20. Pharmacy notified to send Trazadone request to other provider who provides it to the patient, Dr. Evelene Croon.Refusing request at this time.

## 2020-09-09 ENCOUNTER — Encounter (HOSPITAL_COMMUNITY): Payer: Medicare Other | Admitting: Physician Assistant

## 2020-09-20 ENCOUNTER — Other Ambulatory Visit (HOSPITAL_COMMUNITY): Payer: Self-pay | Admitting: Psychiatry

## 2020-09-20 DIAGNOSIS — F1994 Other psychoactive substance use, unspecified with psychoactive substance-induced mood disorder: Secondary | ICD-10-CM

## 2020-09-22 ENCOUNTER — Telehealth (HOSPITAL_COMMUNITY): Payer: Self-pay | Admitting: Psychiatry

## 2020-09-22 NOTE — Telephone Encounter (Signed)
Called pt mobile phone LVM for ret call to determine if she has new psychiatrist or intends to re-establish here

## 2020-09-23 ENCOUNTER — Ambulatory Visit: Payer: Medicare Other | Admitting: Internal Medicine

## 2020-09-24 NOTE — Telephone Encounter (Signed)
Carla LVM TO ask if she has new psychiatrist or intends to re-establish here. LOV 06/2020

## 2020-09-30 ENCOUNTER — Encounter: Payer: Self-pay | Admitting: Internal Medicine

## 2020-09-30 ENCOUNTER — Ambulatory Visit (INDEPENDENT_AMBULATORY_CARE_PROVIDER_SITE_OTHER): Payer: Medicare Other | Admitting: Internal Medicine

## 2020-09-30 ENCOUNTER — Other Ambulatory Visit: Payer: Self-pay

## 2020-09-30 VITALS — BP 110/70 | HR 72 | Temp 98.0°F | Ht 66.0 in | Wt 185.9 lb

## 2020-09-30 DIAGNOSIS — I10 Essential (primary) hypertension: Secondary | ICD-10-CM | POA: Diagnosis not present

## 2020-09-30 DIAGNOSIS — G4733 Obstructive sleep apnea (adult) (pediatric): Secondary | ICD-10-CM

## 2020-09-30 DIAGNOSIS — G479 Sleep disorder, unspecified: Secondary | ICD-10-CM | POA: Diagnosis not present

## 2020-09-30 DIAGNOSIS — F1421 Cocaine dependence, in remission: Secondary | ICD-10-CM

## 2020-09-30 DIAGNOSIS — Z1231 Encounter for screening mammogram for malignant neoplasm of breast: Secondary | ICD-10-CM | POA: Diagnosis not present

## 2020-09-30 DIAGNOSIS — Z1211 Encounter for screening for malignant neoplasm of colon: Secondary | ICD-10-CM | POA: Diagnosis not present

## 2020-09-30 DIAGNOSIS — Z720 Tobacco use: Secondary | ICD-10-CM

## 2020-09-30 DIAGNOSIS — Z23 Encounter for immunization: Secondary | ICD-10-CM

## 2020-09-30 DIAGNOSIS — E782 Mixed hyperlipidemia: Secondary | ICD-10-CM | POA: Diagnosis not present

## 2020-09-30 DIAGNOSIS — K219 Gastro-esophageal reflux disease without esophagitis: Secondary | ICD-10-CM

## 2020-09-30 DIAGNOSIS — M1A9XX0 Chronic gout, unspecified, without tophus (tophi): Secondary | ICD-10-CM

## 2020-09-30 DIAGNOSIS — F319 Bipolar disorder, unspecified: Secondary | ICD-10-CM | POA: Diagnosis not present

## 2020-09-30 NOTE — Patient Instructions (Signed)
-  Nice seeing you today!!  -first shingles vaccine today.  -Schedule follow up for your physical in 3 months. Please come in fasting that day.

## 2020-09-30 NOTE — Progress Notes (Signed)
New Patient Office Visit     This visit occurred during the SARS-CoV-2 public health emergency.  Safety protocols were in place, including screening questions prior to the visit, additional usage of staff PPE, and extensive cleaning of exam room while observing appropriate contact time as indicated for disinfecting solutions.    CC/Reason for Visit: Establish care, discuss chronic medical conditions Previous PCP: Shirlee Latch, MD Last Visit: March 2022  HPI: Marisa Sherman is a 60 y.o. female who is coming in today for the above mentioned reasons. Past Medical History is significant for: Hypertension, hyperlipidemia, GERD, peripheral neuropathy, gout, bipolar disorder followed by psychiatry, substance abuse of crack cocaine, marijuana, she has been sober for about 6 months and currently living in a recovery house.  She is a smoker of about a pack a day for 10 years, has no significant family history, past surgical history significant for tubal ligation and a cholecystectomy in 2000.  She is requesting shingles vaccine today.    She is overdue for a physical exam.  She has been complaining of frequent nighttime awakenings and daytime fatigue.  Her prior PCP had recommended a sleep study but this did not happen.   Past Medical/Surgical History: Past Medical History:  Diagnosis Date   Abnormal Pap smear of cervix    Allergy    Anxiety    Bipolar disorder (HCC)    Cataract    Degenerative disc disease at L5-S1 level    Depression    GERD (gastroesophageal reflux disease)    Gout    Hyperlipidemia    Hypertension    Overactive bladder     Past Surgical History:  Procedure Laterality Date   CARPAL TUNNEL RELEASE Bilateral    CERVIX LESION DESTRUCTION  1985   CHOLECYSTECTOMY     DILATION AND CURETTAGE OF UTERUS     TONSILLECTOMY     TUBAL LIGATION      Social History:  reports that she has been smoking cigarettes. She has a 40.00 pack-year smoking history. She has never  used smokeless tobacco. She reports that she does not currently use alcohol. She reports that she does not currently use drugs.  Allergies: Allergies  Allergen Reactions   Codeine Nausea Only   Diphenhydramine Hcl Other (See Comments)    Pt unsure why   Lithium Itching   Sulfa Antibiotics Nausea And Vomiting   Amoxicillin     N & V    Family History:  Family History  Problem Relation Age of Onset   Depression Mother    Anxiety disorder Mother    Asthma Mother    Bipolar disorder Father    Depression Sister    Anxiety disorder Sister    Depression Brother    Anxiety disorder Brother    Depression Brother    Anxiety disorder Brother    Cancer Neg Hx    Diabetes Neg Hx    Heart disease Neg Hx    Colon cancer Neg Hx    Breast cancer Neg Hx    Ovarian cancer Neg Hx    Cervical cancer Neg Hx      Current Outpatient Medications:    allopurinol (ZYLOPRIM) 100 MG tablet, Take 1 tablet (100 mg total) by mouth daily., Disp: 90 tablet, Rfl: 0   aspirin EC 81 MG tablet, Take 81 mg by mouth daily., Disp: , Rfl:    atorvastatin (LIPITOR) 40 MG tablet, Take 1 tablet (40 mg total) by mouth daily at 6 PM., Disp:  90 tablet, Rfl: 0   FLUoxetine (PROZAC) 40 MG capsule, Take 1 capsule (40 mg total) by mouth every morning., Disp: 90 capsule, Rfl: 0   hydrOXYzine (VISTARIL) 25 MG capsule, Take 1 capsule (25 mg total) by mouth every 8 (eight) hours as needed for anxiety., Disp: 180 capsule, Rfl: 0   lisinopril (ZESTRIL) 20 MG tablet, Take 1 tablet (20 mg total) by mouth daily., Disp: 90 tablet, Rfl: 0   pantoprazole (PROTONIX) 40 MG tablet, Take 1 tablet (40 mg total) by mouth daily., Disp: 90 tablet, Rfl: 3   QUEtiapine (SEROQUEL) 100 MG tablet, Take 1 tablet (100 mg total) by mouth at bedtime., Disp: 90 tablet, Rfl: 0   sucralfate (CARAFATE) 1 g tablet, Take 1 g by mouth 3 (three) times daily., Disp: , Rfl:    traZODone (DESYREL) 100 MG tablet, TAKE 2 TABLETS BY MOUTH AT BEDTIME AS NEEDED  SLEEP, Disp: 180 tablet, Rfl: 0  Review of Systems:  Constitutional: Denies fever, chills, diaphoresis, appetite change and fatigue.  HEENT: Denies photophobia, eye pain, redness, hearing loss, ear pain, congestion, sore throat, rhinorrhea, sneezing, mouth sores, trouble swallowing, neck pain, neck stiffness and tinnitus.   Respiratory: Denies SOB, DOE, cough, chest tightness,  and wheezing.   Cardiovascular: Denies chest pain, palpitations and leg swelling.  Gastrointestinal: Denies nausea, vomiting, abdominal pain, diarrhea, constipation, blood in stool and abdominal distention.  Genitourinary: Denies dysuria, urgency, frequency, hematuria, flank pain and difficulty urinating.  Endocrine: Denies: hot or cold intolerance, sweats, changes in hair or nails, polyuria, polydipsia. Musculoskeletal: Denies myalgias, back pain, joint swelling, arthralgias and gait problem.  Skin: Denies pallor, rash and wound.  Neurological: Denies dizziness, seizures, syncope, weakness, light-headedness, numbness and headaches.  Hematological: Denies adenopathy. Easy bruising, personal or family bleeding history  Psychiatric/Behavioral: Denies suicidal ideation, mood changes, confusion, nervousness, sleep disturbance and agitation    Physical Exam: Vitals:   09/30/20 1508  BP: 110/70  Pulse: 72  Temp: 98 F (36.7 C)  TempSrc: Oral  SpO2: 93%  Weight: 185 lb 14.4 oz (84.3 kg)  Height: 5\' 6"  (1.676 m)   Body mass index is 30.01 kg/m.   Constitutional: NAD, calm, comfortable Eyes: PERRL, lids and conjunctivae normal ENMT: Mucous membranes are moist.  Respiratory: clear to auscultation bilaterally, no wheezing, no crackles. Normal respiratory effort. No accessory muscle use.  Cardiovascular: Regular rate and rhythm, no murmurs / rubs / gallops. No extremity edema.  Neurologic: Grossly intact and nonfocal Psychiatric: Normal judgment and insight. Alert and oriented x 3. Normal mood.    Impression and  Plan:  Screening for malignant neoplasm of colon   Plan: Ambulatory referral to Gastroenterology  Screening mammogram for breast cancer  - Plan: MM Digital Screening  Sleep disorder  - Plan: Ambulatory referral to Neurology for consideration of sleep study for presumptive OSA.  Bipolar 1 disorder, depressed (HCC) -Followed by psychiatry  Essential hypertension -Blood pressures currently well controlled.  Mixed hyperlipidemia -Check lipids when she returns for CPE, currently on atorvastatin 40 mg daily.  Cocaine use disorder, severe, in early remission (HCC) -Been sober for 6 months and currently living in a recovery house.  Tobacco abuse -Smokes a pack a day, continue to discuss at subsequent visits.  Gastroesophageal reflux disease, unspecified whether esophagitis present -On pantoprazole daily  Chronic gout without tophus, unspecified cause, unspecified site -No recent flareups, on allopurinol  Need for shingles vaccine -First shingles vaccine administered today.     Patient Instructions  -Nice seeing you today!!  -  first shingles vaccine today.  -Schedule follow up for your physical in 3 months. Please come in fasting that day.    Chaya Jan, MD [facility]

## 2020-09-30 NOTE — Addendum Note (Signed)
Addended by: Kern Reap B on: 09/30/2020 04:56 PM   Modules accepted: Orders

## 2020-10-14 ENCOUNTER — Ambulatory Visit: Payer: Medicare Other | Admitting: Family Medicine

## 2020-10-20 ENCOUNTER — Other Ambulatory Visit: Payer: Self-pay | Admitting: Family Medicine

## 2020-10-20 ENCOUNTER — Other Ambulatory Visit (HOSPITAL_COMMUNITY): Payer: Self-pay | Admitting: Psychiatry

## 2020-10-20 DIAGNOSIS — F419 Anxiety disorder, unspecified: Secondary | ICD-10-CM

## 2020-10-20 DIAGNOSIS — G4700 Insomnia, unspecified: Secondary | ICD-10-CM

## 2020-10-20 DIAGNOSIS — F1994 Other psychoactive substance use, unspecified with psychoactive substance-induced mood disorder: Secondary | ICD-10-CM

## 2020-10-20 NOTE — Telephone Encounter (Signed)
Message acknowledged and reviewed.  Patient does need a follow-up visit.

## 2020-10-30 ENCOUNTER — Telehealth: Payer: Self-pay

## 2020-10-30 NOTE — Telephone Encounter (Signed)
  Care Management   Follow Up Note   10/30/2020 Name: Marisa Sherman MRN: [id number] DOB: [birth year]   Primary Care Provider: Philip Aspen, Limmie Patricia, MD Reason for referral : Chronic Care Management   An unsuccessful telephone outreach was attempted today. The patient was referred to the case management team for assistance with care management and care coordination.    Follow Up Plan:  A HIPAA compliant voice message was left today requesting a return call.   [facility] Care Management [facility] [phone]

## 2020-11-03 ENCOUNTER — Other Ambulatory Visit: Payer: Self-pay

## 2020-11-03 ENCOUNTER — Observation Stay (HOSPITAL_COMMUNITY)
Admission: EM | Admit: 2020-11-03 | Discharge: 2020-11-04 | Disposition: A | Discharge status: Home / Self Care | Payer: Medicare Other | Attending: Internal Medicine | Admitting: Internal Medicine

## 2020-11-03 ENCOUNTER — Encounter (HOSPITAL_COMMUNITY): Payer: Self-pay | Admitting: Emergency Medicine

## 2020-11-03 ENCOUNTER — Emergency Department (HOSPITAL_COMMUNITY): Payer: Medicare Other

## 2020-11-03 DIAGNOSIS — F17210 Nicotine dependence, cigarettes, uncomplicated: Secondary | ICD-10-CM | POA: Insufficient documentation

## 2020-11-03 DIAGNOSIS — E876 Hypokalemia: Secondary | ICD-10-CM | POA: Diagnosis present

## 2020-11-03 DIAGNOSIS — R079 Chest pain, unspecified: Principal | ICD-10-CM | POA: Diagnosis present

## 2020-11-03 DIAGNOSIS — Z20822 Contact with and (suspected) exposure to covid-19: Secondary | ICD-10-CM | POA: Diagnosis not present

## 2020-11-03 DIAGNOSIS — R0789 Other chest pain: Secondary | ICD-10-CM | POA: Diagnosis not present

## 2020-11-03 DIAGNOSIS — D72829 Elevated white blood cell count, unspecified: Secondary | ICD-10-CM | POA: Diagnosis not present

## 2020-11-03 DIAGNOSIS — Z79899 Other long term (current) drug therapy: Secondary | ICD-10-CM | POA: Diagnosis not present

## 2020-11-03 DIAGNOSIS — K219 Gastro-esophageal reflux disease without esophagitis: Secondary | ICD-10-CM | POA: Diagnosis present

## 2020-11-03 DIAGNOSIS — Z7982 Long term (current) use of aspirin: Secondary | ICD-10-CM | POA: Insufficient documentation

## 2020-11-03 DIAGNOSIS — I10 Essential (primary) hypertension: Secondary | ICD-10-CM | POA: Diagnosis present

## 2020-11-03 DIAGNOSIS — E785 Hyperlipidemia, unspecified: Secondary | ICD-10-CM | POA: Diagnosis present

## 2020-11-03 DIAGNOSIS — Z720 Tobacco use: Secondary | ICD-10-CM | POA: Diagnosis present

## 2020-11-03 DIAGNOSIS — F419 Anxiety disorder, unspecified: Secondary | ICD-10-CM | POA: Diagnosis present

## 2020-11-03 DIAGNOSIS — R110 Nausea: Secondary | ICD-10-CM | POA: Diagnosis not present

## 2020-11-03 DIAGNOSIS — R0602 Shortness of breath: Secondary | ICD-10-CM | POA: Diagnosis not present

## 2020-11-03 DIAGNOSIS — M546 Pain in thoracic spine: Secondary | ICD-10-CM | POA: Diagnosis not present

## 2020-11-03 LAB — BASIC METABOLIC PANEL
Anion gap: 9 (ref 5–15)
BUN: 6 mg/dL — ABNORMAL LOW (ref 8–23)
CO2: 26 mmol/L (ref 22–32)
Calcium: 9 mg/dL (ref 8.9–10.3)
Chloride: 105 mmol/L (ref 98–111)
Creatinine, Ser: 0.69 mg/dL (ref 0.44–1.00)
GFR, Estimated: >60 mL/min (ref >60)
Glucose, Bld: 107 mg/dL — ABNORMAL HIGH (ref 70–99)
Potassium: 3.2 mmol/L — ABNORMAL LOW (ref 3.5–5.1)
Sodium: 140 mmol/L (ref 135–145)

## 2020-11-03 LAB — TROPONIN I (HIGH SENSITIVITY)
Troponin I (High Sensitivity): 3 ng/L (ref <18)
Troponin I (High Sensitivity): 4 ng/L (ref <18)

## 2020-11-03 LAB — CBC
HCT: 40.5 % (ref 36.0–46.0)
Hemoglobin: 13.5 g/dL (ref 12.0–15.0)
MCH: 28.7 pg (ref 26.0–34.0)
MCHC: 33.3 g/dL (ref 30.0–36.0)
MCV: 86 fL (ref 80.0–100.0)
Platelets: 299 10*3/uL (ref 150–400)
RBC: 4.71 MIL/uL (ref 3.87–5.11)
RDW: 12.6 % (ref 11.5–15.5)
WBC: 11.4 10*3/uL — ABNORMAL HIGH (ref 4.0–10.5)
nRBC: 0 % (ref 0.0–0.2)

## 2020-11-03 NOTE — ED Triage Notes (Signed)
Pt coming from home, complaint of chest pain, n/v off and on for a few months. VSS. NAD. Hx of HTN, did not take medication today.

## 2020-11-04 ENCOUNTER — Observation Stay (HOSPITAL_BASED_OUTPATIENT_CLINIC_OR_DEPARTMENT_OTHER): Payer: Medicare Other

## 2020-11-04 ENCOUNTER — Emergency Department (HOSPITAL_COMMUNITY): Payer: Medicare Other

## 2020-11-04 ENCOUNTER — Encounter (HOSPITAL_COMMUNITY): Payer: Self-pay | Admitting: Internal Medicine

## 2020-11-04 DIAGNOSIS — I10 Essential (primary) hypertension: Secondary | ICD-10-CM | POA: Diagnosis not present

## 2020-11-04 DIAGNOSIS — R079 Chest pain, unspecified: Secondary | ICD-10-CM

## 2020-11-04 DIAGNOSIS — E782 Mixed hyperlipidemia: Secondary | ICD-10-CM

## 2020-11-04 DIAGNOSIS — E876 Hypokalemia: Secondary | ICD-10-CM

## 2020-11-04 DIAGNOSIS — F419 Anxiety disorder, unspecified: Secondary | ICD-10-CM

## 2020-11-04 DIAGNOSIS — M546 Pain in thoracic spine: Secondary | ICD-10-CM | POA: Diagnosis not present

## 2020-11-04 DIAGNOSIS — D72829 Elevated white blood cell count, unspecified: Secondary | ICD-10-CM | POA: Diagnosis present

## 2020-11-04 DIAGNOSIS — K219 Gastro-esophageal reflux disease without esophagitis: Secondary | ICD-10-CM | POA: Diagnosis not present

## 2020-11-04 DIAGNOSIS — Z720 Tobacco use: Secondary | ICD-10-CM | POA: Diagnosis not present

## 2020-11-04 LAB — URINALYSIS, COMPLETE (UACMP) WITH MICROSCOPIC
Bacteria, UA: NONE SEEN
Bilirubin Urine: NEGATIVE
Glucose, UA: NEGATIVE mg/dL
Hgb urine dipstick: NEGATIVE
Ketones, ur: NEGATIVE mg/dL
Leukocytes,Ua: NEGATIVE
Nitrite: NEGATIVE
Protein, ur: NEGATIVE mg/dL
Specific Gravity, Urine: 1.01 (ref 1.005–1.030)
pH: 6 (ref 5.0–8.0)

## 2020-11-04 LAB — COMPREHENSIVE METABOLIC PANEL
ALT: 18 U/L (ref 0–44)
AST: 22 U/L (ref 15–41)
Albumin: 3.2 g/dL — ABNORMAL LOW (ref 3.5–5.0)
Alkaline Phosphatase: 60 U/L (ref 38–126)
Anion gap: 8 (ref 5–15)
BUN: 7 mg/dL — ABNORMAL LOW (ref 8–23)
CO2: 24 mmol/L (ref 22–32)
Calcium: 8.5 mg/dL — ABNORMAL LOW (ref 8.9–10.3)
Chloride: 105 mmol/L (ref 98–111)
Creatinine, Ser: 0.71 mg/dL (ref 0.44–1.00)
GFR, Estimated: >60 mL/min (ref >60)
Glucose, Bld: 130 mg/dL — ABNORMAL HIGH (ref 70–99)
Potassium: 3.6 mmol/L (ref 3.5–5.1)
Sodium: 137 mmol/L (ref 135–145)
Total Bilirubin: 0.6 mg/dL (ref 0.3–1.2)
Total Protein: 5.9 g/dL — ABNORMAL LOW (ref 6.5–8.1)

## 2020-11-04 LAB — D-DIMER, QUANTITATIVE: D-Dimer, Quant: 0.68 ug/mL-FEU — ABNORMAL HIGH (ref 0.00–0.50)

## 2020-11-04 LAB — RAPID URINE DRUG SCREEN, HOSP PERFORMED
Amphetamines: NOT DETECTED
Barbiturates: NOT DETECTED
Benzodiazepines: NOT DETECTED
Cocaine: NOT DETECTED
Opiates: NOT DETECTED
Tetrahydrocannabinol: NOT DETECTED

## 2020-11-04 LAB — CBC WITH DIFFERENTIAL/PLATELET
Abs Immature Granulocytes: 0.03 10*3/uL (ref 0.00–0.07)
Basophils Absolute: 0 10*3/uL (ref 0.0–0.1)
Basophils Relative: 0 %
Eosinophils Absolute: 0.2 10*3/uL (ref 0.0–0.5)
Eosinophils Relative: 2 %
HCT: 35.6 % — ABNORMAL LOW (ref 36.0–46.0)
Hemoglobin: 11.8 g/dL — ABNORMAL LOW (ref 12.0–15.0)
Immature Granulocytes: 0 %
Lymphocytes Relative: 34 %
Lymphs Abs: 3.1 10*3/uL (ref 0.7–4.0)
MCH: 28.9 pg (ref 26.0–34.0)
MCHC: 33.1 g/dL (ref 30.0–36.0)
MCV: 87.3 fL (ref 80.0–100.0)
Monocytes Absolute: 0.7 10*3/uL (ref 0.1–1.0)
Monocytes Relative: 7 %
Neutro Abs: 5.3 10*3/uL (ref 1.7–7.7)
Neutrophils Relative %: 57 %
Platelets: 252 10*3/uL (ref 150–400)
RBC: 4.08 MIL/uL (ref 3.87–5.11)
RDW: 12.6 % (ref 11.5–15.5)
WBC: 9.3 10*3/uL (ref 4.0–10.5)
nRBC: 0 % (ref 0.0–0.2)

## 2020-11-04 LAB — ECHOCARDIOGRAM COMPLETE
Area-P 1/2: 2.77 cm2
S' Lateral: 2.4 cm

## 2020-11-04 LAB — LIPASE, BLOOD: Lipase: 30 U/L (ref 11–51)

## 2020-11-04 LAB — RESP PANEL BY RT-PCR (FLU A&B, COVID) ARPGX2
Influenza A by PCR: NEGATIVE
Influenza B by PCR: NEGATIVE
SARS Coronavirus 2 by RT PCR: NEGATIVE

## 2020-11-04 LAB — MAGNESIUM: Magnesium: 1.9 mg/dL (ref 1.7–2.4)

## 2020-11-04 LAB — HIV ANTIBODY (ROUTINE TESTING W REFLEX): HIV Screen 4th Generation wRfx: NONREACTIVE

## 2020-11-04 LAB — TROPONIN I (HIGH SENSITIVITY): Troponin I (High Sensitivity): 4 ng/L (ref <18)

## 2020-11-04 MED ORDER — ACETAMINOPHEN 650 MG RE SUPP: 650.0000 mg | Freq: Four times a day (QID) | RECTAL | Status: DC | PRN

## 2020-11-04 MED ORDER — ACETAMINOPHEN 325 MG PO TABS
650.0000 mg | ORAL_TABLET | Freq: Once | ORAL | Status: AC
  Administered 2020-11-04: 650 mg via ORAL
  Filled 2020-11-04: qty 2

## 2020-11-04 MED ORDER — LISINOPRIL 20 MG PO TABS
20.0000 mg | ORAL_TABLET | Freq: Once | ORAL | Status: AC
  Administered 2020-11-04: 20 mg via ORAL
  Filled 2020-11-04: qty 1

## 2020-11-04 MED ORDER — ASPIRIN EC 81 MG PO TBEC: 81.0000 mg | DELAYED_RELEASE_TABLET | Freq: Every day | ORAL | Status: DC

## 2020-11-04 MED ORDER — CLONIDINE HCL 0.2 MG PO TABS
0.2000 mg | ORAL_TABLET | Freq: Once | ORAL | Status: AC
  Administered 2020-11-04: 0.2 mg via ORAL
  Filled 2020-11-04: qty 1

## 2020-11-04 MED ORDER — FLUOXETINE HCL 20 MG PO CAPS
40.0000 mg | ORAL_CAPSULE | Freq: Every morning | ORAL | Status: DC
  Administered 2020-11-04: 40 mg via ORAL
  Filled 2020-11-04: qty 2

## 2020-11-04 MED ORDER — NICOTINE 21 MG/24HR TD PT24: 21.0000 mg | MEDICATED_PATCH | Freq: Every day | TRANSDERMAL | Status: DC | PRN

## 2020-11-04 MED ORDER — LISINOPRIL 20 MG PO TABS: 20.0000 mg | ORAL_TABLET | Freq: Every day | ORAL | Status: DC

## 2020-11-04 MED ORDER — PANTOPRAZOLE SODIUM 40 MG PO TBEC
40.0000 mg | DELAYED_RELEASE_TABLET | Freq: Every day | ORAL | Status: DC
  Administered 2020-11-04: 40 mg via ORAL
  Filled 2020-11-04: qty 1

## 2020-11-04 MED ORDER — POTASSIUM CHLORIDE CRYS ER 20 MEQ PO TBCR
40.0000 meq | EXTENDED_RELEASE_TABLET | Freq: Once | ORAL | Status: AC
  Administered 2020-11-04: 40 meq via ORAL
  Filled 2020-11-04: qty 2

## 2020-11-04 MED ORDER — QUETIAPINE FUMARATE 100 MG PO TABS
100.0000 mg | ORAL_TABLET | Freq: Every day | ORAL | Status: DC
  Filled 2020-11-04: qty 1

## 2020-11-04 MED ORDER — ATORVASTATIN CALCIUM 40 MG PO TABS: 40.0000 mg | ORAL_TABLET | Freq: Every day | ORAL | Status: DC

## 2020-11-04 MED ORDER — NITROGLYCERIN 0.4 MG SL SUBL: 0.4000 mg | SUBLINGUAL_TABLET | SUBLINGUAL | Status: DC | PRN

## 2020-11-04 MED ORDER — ACETAMINOPHEN 325 MG PO TABS
650.0000 mg | ORAL_TABLET | Freq: Four times a day (QID) | ORAL | Status: DC | PRN
  Administered 2020-11-04: 650 mg via ORAL
  Filled 2020-11-04: qty 2

## 2020-11-04 MED ORDER — ASPIRIN 325 MG PO TABS
325.0000 mg | ORAL_TABLET | Freq: Once | ORAL | Status: AC
  Administered 2020-11-04: 325 mg via ORAL
  Filled 2020-11-04: qty 1

## 2020-11-04 MED ORDER — HYDROXYZINE HCL 25 MG PO TABS: 25.0000 mg | ORAL_TABLET | Freq: Three times a day (TID) | ORAL | Status: DC | PRN

## 2020-11-04 NOTE — ED Notes (Signed)
Sitting up on side of bed eating lunch.

## 2020-11-04 NOTE — ED Notes (Signed)
The pt has been discharged waiting on discharge instructions

## 2020-11-04 NOTE — ED Notes (Signed)
Echo at bedside

## 2020-11-04 NOTE — ED Notes (Signed)
Per Dr. Blinda Leatherwood okay to wait on IV at this time - try to control BP with PO meds

## 2020-11-04 NOTE — H&P (Signed)
History and Physical    PLEASE NOTE THAT DRAGON DICTATION SOFTWARE WAS USED IN THE CONSTRUCTION OF THIS NOTE.   Marisa Sherman NUU:[id number] DOB: 1960-10-01 DOA: 11/03/2020  PCP: Philip Aspen, Limmie Patricia, MD Patient coming from: home   I have personally briefly reviewed patient's old medical records in [facility]  Chief Complaint: Chest pain  HPI: Marisa Sherman is a 60 y.o. female with medical history significant for hypertension, hyperlipidemia, GERD, generalized anxiety disorder, chronic tobacco abuse, who is admitted to [facility] on 11/03/2020 for further evaluation management of chest pain at presenting from home to [facility] emergency department complaining of such.  Patient reports that she has been experiencing 2 days of constant substernal chest pressure with radiation into the bilateral arms, neck, and into the back.  Patient reports exacerbation with exertion, noting worsening of the associated intensity when she went on a walk earlier today.  No worsening with deep inspiration.  She reports an element of reproducibility with palpation over the substernal and left anterior chest wall.  She feels that the chest pain improves when lying flat, otherwise denies any positional exacerbation or amelioration.  Reports that she has never previously experienced chest pain similar to this, and denies known personal history of coronary artery disease.  She confirms no prior coronary ischemic evaluation, noting no prior stress test or coronary angiography.  She conveys that she continue to experience chest pain after arriving in the emergency department today, with chest pain completely resolving after improvement in her blood pressure following doses of multiple antihypertensive medications in the ED, as further described above.  Patient reports no recurrence of chest pain, and confirms that she is currently chest pain-free.  She confirms that she has received no sublingual  nitroglycerin over the last 2 days.   Denies any associated palpitations, diaphoresis, dizziness, presyncope, or syncope.  She notes associated shortness of breath in the absence of any cough or hemoptysis.  Denies any associated subjective fever, chills, rigors, or generalized myalgias.  Denies any personal or family history of DVT/PE or inheritable hypercoagulable state.  Denies any recent trauma, travel, surgery, extended periods of diminishing ambulatory activity, or any personal history of malignancy.  Denies any new peripheral swelling, erythema, calf tenderness, or hemoptysis.   She confirms a history of essential hypertension, hyperlipidemia, but denies any family history of premature coronary artery disease.  She conveys that she is a current smoker, having smoked between half pack per day to a full pack per day of cigarettes for greater than 40 years.  She notes recent reduction in her smoking habits, stating that she now consistently smokes half pack per day.  Denies any known underlying history of diabetes.   Medical history also notable for GERD, for which she reports good compliance with home Protonix.  Per chart review, there is documentation of a history of cocaine abuse, although the patient denies any recent recreational drug use.    ED Course:  Vital signs in the ED were notable for the following: Afebrile; heart rate 56-74; initial blood pressure 199/106, with ensuing improvement to 113/76 following administration of clonidine 0.2 mg p.o. x1 as well as lisinopril 20 mg p.o. x1.  Respiratory rate 16-20, oxygen saturation 95 to 98% on room air.  Labs were notable for the following: BMP notable for the following potassium 3.2, creatinine 0.69.  Initial high-sensitivity troponin I found to be 3, with repeat value trending to 4.  CBC notable for white blood cell count  11,400, hemoglobin 13.5.  Screening COVID-19/influenza PCR were checked in the ED today, with results currently  pending.  Imaging and additional notable ED work-up: EKG shows sinus rhythm with heart rate 75, normal intervals, no evidence of T wave or ST changes, including no evidence of ST elevation, with Q waves in V1/V2, which are unchanged relative to most recent prior EKG on 03/25/2020.  Chest x-ray shows no evidence of acute cardiopulmonary process, including no evidence of infiltrate, edema, effusion, or pneumothorax.  Given an element of radiation of the patient's chest pain into the back, plain films of the thoracic spine were obtained and showed mild multilevel degenerative changes in the thoracic spine.  While in the ED, the following were administered: In addition to the aforementioned single dose of lisinopril and clonidine, she also received acetaminophen 650 mg p.o. x1.  Subsequently, the patient is admitted for overnight observation to cardiac telemetry unit for further evaluation and management of presenting chest pain.     Review of Systems: As per HPI otherwise 10 point review of systems negative.   Past Medical History:  Diagnosis Date   Abnormal Pap smear of cervix    Allergy    Anxiety    Bipolar disorder (HCC)    Cataract    Degenerative disc disease at L5-S1 level    Depression    GERD (gastroesophageal reflux disease)    Gout    Hyperlipidemia    Hypertension    Overactive bladder     Past Surgical History:  Procedure Laterality Date   CARPAL TUNNEL RELEASE Bilateral    CERVIX LESION DESTRUCTION  1985   CHOLECYSTECTOMY     DILATION AND CURETTAGE OF UTERUS     TONSILLECTOMY     TUBAL LIGATION      Social History:  reports that she has been smoking cigarettes. She has a 40.00 pack-year smoking history. She has never used smokeless tobacco. She reports that she does not currently use alcohol. She reports that she does not currently use drugs.   Allergies  Allergen Reactions   Codeine Nausea Only   Diphenhydramine Hcl Other (See Comments)    Pt unsure why    Lithium Itching   Sulfa Antibiotics Nausea And Vomiting   Amoxicillin     N & V    Family History  Problem Relation Age of Onset   Depression Mother    Anxiety disorder Mother    Asthma Mother    Bipolar disorder Father    Depression Sister    Anxiety disorder Sister    Depression Brother    Anxiety disorder Brother    Depression Brother    Anxiety disorder Brother    Cancer Neg Hx    Diabetes Neg Hx    Heart disease Neg Hx    Colon cancer Neg Hx    Breast cancer Neg Hx    Ovarian cancer Neg Hx    Cervical cancer Neg Hx     Family history reviewed and not pertinent    Prior to Admission medications   Medication Sig Start Date End Date Taking? Authorizing Provider  allopurinol (ZYLOPRIM) 100 MG tablet Take 1 tablet (100 mg total) by mouth daily. 07/06/20   Erasmo Downer, MD  aspirin EC 81 MG tablet Take 81 mg by mouth daily.    [provider]  atorvastatin (LIPITOR) 40 MG tablet Take 1 tablet (40 mg total) by mouth daily at 6 PM. 07/06/20   Bacigalupo, Marzella Schlein, MD  FLUoxetine (  PROZAC) 40 MG capsule Take 1 capsule (40 mg total) by mouth every morning. 07/20/20   Zena Amos, MD  hydrOXYzine (VISTARIL) 25 MG capsule Take 1 capsule (25 mg total) by mouth every 8 (eight) hours as needed for anxiety. 07/20/20   Zena Amos, MD  lisinopril (ZESTRIL) 20 MG tablet Take 1 tablet (20 mg total) by mouth daily. 07/06/20   Erasmo Downer, MD  pantoprazole (PROTONIX) 40 MG tablet Take 1 tablet (40 mg total) by mouth daily. 07/06/20   Erasmo Downer, MD  QUEtiapine (SEROQUEL) 100 MG tablet Take 1 tablet (100 mg total) by mouth at bedtime. 07/20/20   Zena Amos, MD  sucralfate (CARAFATE) 1 g tablet Take 1 g by mouth 3 (three) times daily. 07/25/20   [provider]  traZODone (DESYREL) 100 MG tablet TAKE 2 TABLETS BY MOUTH AT BEDTIME AS NEEDED SLEEP 07/06/20   Erasmo Downer, MD     Objective    Physical Exam: Vitals:   11/04/20 0400 11/04/20  0409 11/04/20 0410 11/04/20 0445  BP:  104/69  113/76  Pulse: (!) 57 (!) 56 60 62  Resp: (!) 23 17 16 16   Temp:      TempSrc:      SpO2: 93% 95% 96% 96%    General: appears to be stated age; alert, oriented Skin: warm, dry, no rash Head:  AT/[place] Mouth:  Oral mucosa membranes appear moist, normal dentition Neck: supple; trachea midline Chest: Tenderness with palpation over the left portion of the anterior chest wall, without any associated evidence of paradoxical rise and fall the chest. Heart:  RRR; did not appreciate any M/R/G Lungs: CTAB, did not appreciate any wheezes, rales, or rhonchi Abdomen: + BS; soft, ND, NT Vascular: 2+ pedal pulses b/l; 2+ radial pulses b/l Extremities: no peripheral edema, no muscle wasting Neuro: strength and sensation intact in upper and lower extremities b/l   Labs on Admission: I have personally reviewed following labs and imaging studies  CBC: Recent Labs  Lab 11/03/20 1738  WBC 11.4*  HGB 13.5  HCT 40.5  MCV 86.0  PLT 299   Basic Metabolic Panel: Recent Labs  Lab 11/03/20 1738  NA 140  K 3.2*  CL 105  CO2 26  GLUCOSE 107*  BUN 6*  CREATININE 0.69  CALCIUM 9.0   GFR: CrCl cannot be calculated (Unknown ideal weight.). Liver Function Tests: No results for input(s): AST, ALT, ALKPHOS, BILITOT, PROT, ALBUMIN in the last 168 hours. No results for input(s): LIPASE, AMYLASE in the last 168 hours. No results for input(s): AMMONIA in the last 168 hours. Coagulation Profile: No results for input(s): INR, PROTIME in the last 168 hours. Cardiac Enzymes: No results for input(s): CKTOTAL, CKMB, CKMBINDEX, TROPONINI in the last 168 hours. BNP (last 3 results) No results for input(s): PROBNP in the last 8760 hours. HbA1C: No results for input(s): HGBA1C in the last 72 hours. CBG: No results for input(s): GLUCAP in the last 168 hours. Lipid Profile: No results for input(s): CHOL, HDL, LDLCALC, TRIG, CHOLHDL, LDLDIRECT in the last 72  hours. Thyroid Function Tests: No results for input(s): TSH, T4TOTAL, FREET4, T3FREE, THYROIDAB in the last 72 hours. Anemia Panel: No results for input(s): VITAMINB12, FOLATE, FERRITIN, TIBC, IRON, RETICCTPCT in the last 72 hours. Urine analysis:    Component Value Date/Time   BILIRUBINUR Neg 08/10/2018 1039   PROTEINUR Negative 08/10/2018 1039   UROBILINOGEN 0.2 08/10/2018 1039   NITRITE Neg 08/10/2018 1039   LEUKOCYTESUR Moderate (2+) (  A) 08/10/2018 1039    Radiological Exams on Admission: DG Chest 1 View  Result Date: 11/03/2020 CLINICAL DATA:  Left-sided chest pain, short of breath, nausea EXAM: CHEST  1 VIEW COMPARISON:  03/24/2020 FINDINGS: The heart size and mediastinal contours are within normal limits. Both lungs are clear. The visualized skeletal structures are unremarkable. IMPRESSION: No active disease. Electronically Signed   By: Sharlet Salina M.D.   On: 11/03/2020 18:41   DG Thoracic Spine 2 View  Result Date: 11/04/2020 CLINICAL DATA:  Mid back pain radiating to the chest and down the bilateral arms. EXAM: THORACIC SPINE 2 VIEWS COMPARISON:  None. FINDINGS: There is no evidence of thoracic spine fracture. Alignment is normal. Mild multilevel endplate sclerosis is seen throughout the thoracic spine with mild multilevel intervertebral disc space narrowing. Radiopaque surgical clips are seen within the right upper quadrant. IMPRESSION: Mild multilevel degenerative changes in the thoracic spine. Electronically Signed   By: Aram Candela M.D.   On: 11/04/2020 01:25     EKG: Independently reviewed, with result as described above.    Assessment/Plan    Principal Problem:   Chest pain Active Problems:   Anxiety   Tobacco abuse   Essential hypertension   Hyperlipidemia   GERD (gastroesophageal reflux disease)   Hypokalemia   Leukocytosis     #) Chest Pain: 2 days of constant substernal exertional chest pressure with radiation into the bilateral upper  extremities, neck, and back, a/w sob and an element of reproducibility to palpation over the central/left portion of the anterior chest wall that resolved following improvement of elevated blood pressure, in the absence of any nitroglycerin, without subsequent recurrence.  Etiology currently unclear. given the presence of some typical characteristics in this patient with multiple CAD risk factors, no prior cardiac ischemic evaluation, and a presenting HEART score of 4 associated with moderate risk of major acute cardiac event over the ensuing 6 weeks, the decision was made to admit patient for overnight observation in order to rule-out ACS. Of note, Troponin x 2 negative, EKG shows no evidence of acute ischemic changes, including no evidence of STEMI, and CXR showed no acute CP process, including no evidence of pneumothorax. Patient currently CP free.   Should patient subsequently rule-out for ACS, can then consider possibility of discussing case with cardiology for assistance in determining the necessity/nature of additional ischemic evaluation in the context of patient's age, gender, and risk factors. Will also check echocardiogram assess for focal wall motion abnormalities as a means of helping to further guide the necessity of additional ischemic workup should the patient rule out for ACS.  While less likely, differential also includes the possibility of aortic dissection given substernal chest pain with radiation to the back associated with presenting chest pain in a patient with a history of cocaine abuse.  Will further assess by checking D-dimer which, if negative, would offer a high degree of negative predictive value against underlying aortic dissection.  If D-dimer is positive, within consider pursuing CTA chest with aortic arch protocol to evaluate vomiting or aortic dissection.  At this time, patient appears hemodynamically stable, and remains chest pain-free.  In the setting of a history of cocaine  use, differential also includes coronary artery vasospasm.  Clinically, presentation appears less suggestive of acute pulmonary embolism.  Differential includes various other noncardiac etiologies including musculoskeletal possibilities such as costochondritis in the setting of an element of reproducibility with palpation over the anterior chest wall, as well as GI sources that include  GERD, particular in setting of documented history of such, versus gastritis versus esophagitis versus esophageal spasm versus element of anxiety in the setting of a documented history of generalized anxiety disorder.     Plan: trend serial troponin. Monitor on telemetry. PRN sublingual nitroglycerin. PRN EKG for subsequent episodes of chest pain. Check serum Mg level and repeat BMP in the morning.  Supplementation of mild hypokalemia, as further detailed below.  Repeat CBC in the AM.  Full dose aspirin x1 now.  Resume outpatient high intensity atorvastatin, with next dose to occur now.  Of note, patient received a dose of her home lisinopril in the ED, will continue this medication.  Check echocardiogram, as above.  Check D-dimer, as further detailed above.  Add on lipase.  Check urinary drug screen.       #) Essential hypertension: Documented history of such, on lisinopril as her sole outpatient antihypertensive medication.  Initial systolic blood pressures in the 190s upon presentation to the ED and associated with aforementioned chest pain, that has resolved following improvement in blood pressure with resumption of home lisinopril as well as single dose of oral clonidine.  Given his history, differential includes aortic dissection, as further detailed above, with plan to further evaluate via D-dimer screen.  Of note, the patient reports compliance with her home lisinopril.  We will closely monitor ensuing blood pressure to assist with evaluation for further optimization of her home antihypertensive regimen.  Plan:  Close monitoring and symptom pressure via routine vital signs.  Continue home lisinopril.  Check urinary drug screen.  Check D-dimer.     #) Hypokalemia: Presenting serum potassium mildly low 3.2.  Particularly in the setting of presenting chest pain with some typical features, will provide supplementation with further monitoring, as described below.  Plan: Potassium chloride 40 mEq p.o. x1 dose now.  Add on serum magnesium level.  Repeat BMP in the morning.  Monitor on telemetry.      #) Leukocytosis, mildly elevated white blood cell count of 11,400.  Of unclear significance and etiology.  Does not meet additional SIRS criteria does not appear septic.  No evidence of underlying infectious process at this time, including recent chest x-ray which showed no evidence of acute cardiopulmonary process.  Patient denies any acute urinary symptoms, but will check urinalysis to further evaluate.  At home, screening COVID-19/influenza PCR were checked in the ED today, with results currently pending.   Plan: Further evaluation and management of presenting chest pain, as further detailed above.  Check urinalysis.  Follow-up result of COVID-19/influenza PCR.  Repeat CBC with differential in the morning.  Check urinary drug screen.      #) hyperlipidemia: On high intensity atorvastatin as an outpatient.  Plan: Continue home atorvastatin, with next dose now in the setting of presenting chest pain.      #) Generalized anxiety disorder: Documented history of such, on Prozac as well as as needed hydroxyzine at home.  Plan: Continue on Prozac and as needed hydroxyzine.      #) GERD: Documented history of such, on Protonix as an outpatient.  Plan: Continue home PPI.      #) Chronic tobacco abuse: Patient acknowledges that she is a current smoker, and smoked half pack to 1 pack/day for greater than 40 years now.  Plan: Counseled the patient on the importance of complete smoking  discontinuation.  As needed nicotine patch ordered for use during this hospitalization.      DVT prophylaxis: SCD's   Code  Status: Full code Family Communication: none Disposition Plan: Per Rounding Team Consults called: none;  Admission status: Observation; cardiac telemetry   Of note, this patient was added by me to the following Admit List/Treatment Team:  mcadmits.   Of note, the Adult Admission Order Set (Multimorbid order set) was used by me in the admission process for this patient.  PLEASE NOTE THAT DRAGON DICTATION SOFTWARE WAS USED IN THE CONSTRUCTION OF THIS NOTE.   Angie Fava DO [facility] Pager [phone] From 7PM - 7AM   11/04/2020, 5:00 AM

## 2020-11-04 NOTE — ED Notes (Signed)
ED TO INPATIENT HANDOFF REPORT  ED Nurse Name and Phone #:  S Name/Age/Gender Marisa Sherman 60 y.o. female Room/Bed: 046C/046C  Code Status   Code Status: Full Code  Home/SNF/Other Home Patient oriented to: self, place, time, and situation Is this baseline? Yes   Triage Complete: Triage complete  Chief Complaint Chest pain [R07.9]  Triage Note Pt coming from home, complaint of chest pain, n/v off and on for a few months. VSS. NAD. Hx of HTN, did not take medication today.   Allergies Allergies  Allergen Reactions   Codeine Nausea Only   Diphenhydramine Hcl Other (See Comments)    Pt unsure why   Lithium Itching   Sulfa Antibiotics Nausea And Vomiting   Amoxicillin     N & V    Level of Care/Admitting Diagnosis ED Disposition     ED Disposition  Admit   Condition  --   Comment  Hospital Area: [facility] [[id number]]  Level of Care: Telemetry Cardiac [[id number]]  May place patient in observation at [facility] or [facility] if equivalent level of care is available:: No  Covid Evaluation: Asymptomatic Screening Protocol (No Symptoms)  Diagnosis: Chest pain [347425]  Admitting Physician: Angie Fava [[id number]]  Attending Physician: Angie Fava [[id number]]          B Medical/Surgery History Past Medical History:  Diagnosis Date   Abnormal Pap smear of cervix    Allergy    Anxiety    Bipolar disorder (HCC)    Cataract    Degenerative disc disease at L5-S1 level    Depression    GERD (gastroesophageal reflux disease)    Gout    Hyperlipidemia    Hypertension    Overactive bladder    Past Surgical History:  Procedure Laterality Date   CARPAL TUNNEL RELEASE Bilateral    CERVIX LESION DESTRUCTION  1985   CHOLECYSTECTOMY     DILATION AND CURETTAGE OF UTERUS     TONSILLECTOMY     TUBAL LIGATION       A IV Location/Drains/Wounds Patient Lines/Drains/Airways Status     Active Line/Drains/Airways     Name Placement date  Placement time Site Days   Peripheral IV 11/04/20 20 G Right Wrist 11/04/20  0613  Wrist  less than 1            Intake/Output Last 24 hours No intake or output data in the 24 hours ending 11/04/20 1452  Labs/Imaging Results for orders placed or performed during the hospital encounter of 11/03/20 (from the past 48 hour(s))  Basic metabolic panel     Status: Abnormal   Collection Time: 11/03/20  5:38 PM  Result Value Ref Range   Sodium 140 135 - 145 mmol/L   Potassium 3.2 (L) 3.5 - 5.1 mmol/L   Chloride 105 98 - 111 mmol/L   CO2 26 22 - 32 mmol/L   Glucose, Bld 107 (H) 70 - 99 mg/dL    Comment: Glucose reference range applies only to samples taken after fasting for at least 8 hours.   BUN 6 (L) 8 - 23 mg/dL   Creatinine, Ser 1.88 0.44 - 1.00 mg/dL   Calcium 9.0 8.9 - 41.6 mg/dL   GFR, Estimated >60 >63 mL/min    Comment: (NOTE) Calculated using the CKD-EPI Creatinine Equation (2021)    Anion gap 9 5 - 15    Comment: Performed at [facility], [address], [place], [place] [address]  CBC  Status: Abnormal   Collection Time: 11/03/20  5:38 PM  Result Value Ref Range   WBC 11.4 (H) 4.0 - 10.5 K/uL   RBC 4.71 3.87 - 5.11 MIL/uL   Hemoglobin 13.5 12.0 - 15.0 g/dL   HCT 33.2 95.1 - 88.4 %   MCV 86.0 80.0 - 100.0 fL   MCH 28.7 26.0 - 34.0 pg   MCHC 33.3 30.0 - 36.0 g/dL   RDW 16.6 06.3 - 01.6 %   Platelets 299 150 - 400 K/uL   nRBC 0.0 0.0 - 0.2 %    Comment: Performed at [facility], [address], [place], [place] [address]  Troponin I (High Sensitivity)     Status: None   Collection Time: 11/03/20  5:38 PM  Result Value Ref Range   Troponin I (High Sensitivity) 3 <18 ng/L    Comment: (NOTE) Elevated high sensitivity troponin I (hsTnI) values and significant  changes across serial measurements may suggest ACS but many other  chronic and acute conditions are known to elevate hsTnI results.  Refer to the "Links" section for chest pain algorithms and  additional  guidance. Performed at [facility], [address] [place], [place] [address]   Troponin I (High Sensitivity)     Status: None   Collection Time: 11/03/20  9:03 PM  Result Value Ref Range   Troponin I (High Sensitivity) 4 <18 ng/L    Comment: (NOTE) Elevated high sensitivity troponin I (hsTnI) values and significant  changes across serial measurements may suggest ACS but many other  chronic and acute conditions are known to elevate hsTnI results.  Refer to the "Links" section for chest pain algorithms and additional  guidance. Performed at [facility], [address], [place], [place] [address]   Resp Panel by RT-PCR (Flu A&B, Covid) Nasopharyngeal Swab     Status: None   Collection Time: 11/04/20  4:41 AM   Specimen: Nasopharyngeal Swab; Nasopharyngeal(NP) swabs in vial transport medium  Result Value Ref Range   SARS Coronavirus 2 by RT PCR NEGATIVE NEGATIVE    Comment: (NOTE) SARS-CoV-2 target nucleic acids are NOT DETECTED.  The SARS-CoV-2 RNA is generally detectable in upper respiratory specimens during the acute phase of infection. The lowest concentration of SARS-CoV-2 viral copies this assay can detect is 138 copies/mL. A negative result does not preclude SARS-Cov-2 infection and should not be used as the sole basis for treatment or other patient management decisions. A negative result may occur with  improper specimen collection/handling, submission of specimen other than nasopharyngeal swab, presence of viral mutation(s) within the areas targeted by this assay, and inadequate number of viral copies(<138 copies/mL). A negative result must be combined with clinical observations, patient history, and epidemiological information. The expected result is Negative.  Fact Sheet for Patients:  BloggerCourse.com  Fact Sheet for Healthcare Providers:  SeriousBroker.it  This test is no t yet approved  or cleared by the [place] FDA and  has been authorized for detection and/or diagnosis of SARS-CoV-2 by FDA under an Emergency Use Authorization (EUA). This EUA will remain  in effect (meaning this test can be used) for the duration of the COVID-19 declaration under Section 564(b)(1) of the Act, 21 U.S.C.section 360bbb-3(b)(1), unless the authorization is terminated  or revoked sooner.       Influenza A by PCR NEGATIVE NEGATIVE   Influenza B by PCR NEGATIVE NEGATIVE    Comment: (NOTE) The Xpert Xpress SARS-CoV-2/FLU/RSV plus assay is intended  as an aid in the diagnosis of influenza from Nasopharyngeal swab specimens and should not be used as a sole basis for treatment. Nasal washings and aspirates are unacceptable for Xpert Xpress SARS-CoV-2/FLU/RSV testing.  Fact Sheet for Patients: BloggerCourse.com  Fact Sheet for Healthcare Providers: SeriousBroker.it  This test is not yet approved or cleared by the [place] FDA and has been authorized for detection and/or diagnosis of SARS-CoV-2 by FDA under an Emergency Use Authorization (EUA). This EUA will remain in effect (meaning this test can be used) for the duration of the COVID-19 declaration under Section 564(b)(1) of the Act, 21 U.S.C. section 360bbb-3(b)(1), unless the authorization is terminated or revoked.  Performed at [facility], [address], [place], [place] [address]   HIV Antibody (routine testing w rflx)     Status: None   Collection Time: 11/04/20  5:15 AM  Result Value Ref Range   HIV Screen 4th Generation wRfx Non Reactive Non Reactive    Comment: Performed at [facility], [address], [place], [place] [address]  Magnesium     Status: None   Collection Time: 11/04/20  5:15 AM  Result Value Ref Range   Magnesium 1.9 1.7 - 2.4 mg/dL    Comment: Performed at [facility], [address], [place], [place] [address]  Lipase, blood      Status: None   Collection Time: 11/04/20  5:15 AM  Result Value Ref Range   Lipase 30 11 - 51 U/L    Comment: Performed at [facility], [address], [place], [place] [address]  Troponin I (High Sensitivity)     Status: None   Collection Time: 11/04/20  6:06 AM  Result Value Ref Range   Troponin I (High Sensitivity) 4 <18 ng/L    Comment: (NOTE) Elevated high sensitivity troponin I (hsTnI) values and significant  changes across serial measurements may suggest ACS but many other  chronic and acute conditions are known to elevate hsTnI results.  Refer to the "Links" section for chest pain algorithms and additional  guidance. Performed at [facility], [address] [place], [place] [address]   D-dimer, quantitative     Status: Abnormal   Collection Time: 11/04/20  6:06 AM  Result Value Ref Range   D-Dimer, Quant 0.68 (H) 0.00 - 0.50 ug/mL-FEU    Comment: (NOTE) At the manufacturer cut-off value of 0.5 g/mL FEU, this assay has a negative predictive value of 95-100%.This assay is intended for use in conjunction with a clinical pretest probability (PTP) assessment model to exclude pulmonary embolism (PE) and deep venous thrombosis (DVT) in outpatients suspected of PE or DVT. Results should be correlated with clinical presentation. Performed at [facility], [address] [place], [place] [address]   Comprehensive metabolic panel     Status: Abnormal   Collection Time: 11/04/20  6:06 AM  Result Value Ref Range   Sodium 137 135 - 145 mmol/L   Potassium 3.6 3.5 - 5.1 mmol/L   Chloride 105 98 - 111 mmol/L   CO2 24 22 - 32 mmol/L   Glucose, Bld 130 (H) 70 - 99 mg/dL    Comment: Glucose reference range applies only to samples taken after fasting for at least 8 hours.   BUN 7 (L) 8 - 23 mg/dL   Creatinine, Ser 5.28 0.44 - 1.00 mg/dL   Calcium 8.5 (L) 8.9 - 10.3 mg/dL   Total Protein 5.9 (L)  6.5 - 8.1 g/dL   Albumin 3.2 (L) 3.5 - 5.0 g/dL   AST 22 15 -  41 U/L   ALT 18 0 - 44 U/L   Alkaline Phosphatase 60 38 - 126 U/L   Total Bilirubin 0.6 0.3 - 1.2 mg/dL   GFR, Estimated >13 >08 mL/min    Comment: (NOTE) Calculated using the CKD-EPI Creatinine Equation (2021)    Anion gap 8 5 - 15    Comment: Performed at [facility], [address] [place], [place] [address]  CBC with Differential/Platelet     Status: Abnormal   Collection Time: 11/04/20  6:06 AM  Result Value Ref Range   WBC 9.3 4.0 - 10.5 K/uL   RBC 4.08 3.87 - 5.11 MIL/uL   Hemoglobin 11.8 (L) 12.0 - 15.0 g/dL   HCT 69.6 (L) 29.5 - 28.4 %   MCV 87.3 80.0 - 100.0 fL   MCH 28.9 26.0 - 34.0 pg   MCHC 33.1 30.0 - 36.0 g/dL   RDW 13.2 44.0 - 10.2 %   Platelets 252 150 - 400 K/uL   nRBC 0.0 0.0 - 0.2 %   Neutrophils Relative % 57 %   Neutro Abs 5.3 1.7 - 7.7 K/uL   Lymphocytes Relative 34 %   Lymphs Abs 3.1 0.7 - 4.0 K/uL   Monocytes Relative 7 %   Monocytes Absolute 0.7 0.1 - 1.0 K/uL   Eosinophils Relative 2 %   Eosinophils Absolute 0.2 0.0 - 0.5 K/uL   Basophils Relative 0 %   Basophils Absolute 0.0 0.0 - 0.1 K/uL   Immature Granulocytes 0 %   Abs Immature Granulocytes 0.03 0.00 - 0.07 K/uL    Comment: Performed at [facility], [address], [place], [place] [address]  Rapid urine drug screen (hospital performed)     Status: None   Collection Time: 11/04/20  6:33 AM  Result Value Ref Range   Opiates NONE DETECTED NONE DETECTED   Cocaine NONE DETECTED NONE DETECTED   Benzodiazepines NONE DETECTED NONE DETECTED   Amphetamines NONE DETECTED NONE DETECTED   Tetrahydrocannabinol NONE DETECTED NONE DETECTED   Barbiturates NONE DETECTED NONE DETECTED    Comment: (NOTE) DRUG SCREEN FOR MEDICAL PURPOSES ONLY.  IF CONFIRMATION IS NEEDED FOR ANY PURPOSE, NOTIFY LAB WITHIN 5 DAYS.  LOWEST DETECTABLE LIMITS FOR URINE DRUG SCREEN Drug Class                     Cutoff (ng/mL) Amphetamine and metabolites    1000 Barbiturate and metabolites     200 Benzodiazepine                 200 Tricyclics and metabolites     300 Opiates and metabolites        300 Cocaine and metabolites        300 THC                            50 Performed at [facility], [address] [place], [place] [address]   Urinalysis, Complete w Microscopic     Status: Abnormal   Collection Time: 11/04/20  6:33 AM  Result Value Ref Range   Color, Urine YELLOW YELLOW   APPearance HAZY (A) CLEAR   Specific Gravity, Urine 1.010 1.005 - 1.030   pH 6.0 5.0 - 8.0   Glucose, UA NEGATIVE NEGATIVE mg/dL   Hgb urine  dipstick NEGATIVE NEGATIVE   Bilirubin Urine NEGATIVE NEGATIVE   Ketones, ur NEGATIVE NEGATIVE mg/dL   Protein, ur NEGATIVE NEGATIVE mg/dL   Nitrite NEGATIVE NEGATIVE   Leukocytes,Ua NEGATIVE NEGATIVE   RBC / HPF 0-5 0 - 5 RBC/hpf   WBC, UA 0-5 0 - 5 WBC/hpf   Bacteria, UA NONE SEEN NONE SEEN   Squamous Epithelial / LPF 6-10 0 - 5   Mucus PRESENT     Comment: Performed at [facility], [address], [place], [place] [address]   DG Chest 1 View  Result Date: 11/03/2020 CLINICAL DATA:  Left-sided chest pain, short of breath, nausea EXAM: CHEST  1 VIEW COMPARISON:  03/24/2020 FINDINGS: The heart size and mediastinal contours are within normal limits. Both lungs are clear. The visualized skeletal structures are unremarkable. IMPRESSION: No active disease. Electronically Signed   By: Sharlet Salina M.D.   On: 11/03/2020 18:41   DG Thoracic Spine 2 View  Result Date: 11/04/2020 CLINICAL DATA:  Mid back pain radiating to the chest and down the bilateral arms. EXAM: THORACIC SPINE 2 VIEWS COMPARISON:  None. FINDINGS: There is no evidence of thoracic spine fracture. Alignment is normal. Mild multilevel endplate sclerosis is seen throughout the thoracic spine with mild multilevel intervertebral disc space narrowing. Radiopaque surgical clips are seen within the right upper quadrant. IMPRESSION: Mild multilevel degenerative changes in the thoracic  spine. Electronically Signed   By: Aram Candela M.D.   On: 11/04/2020 01:25   ECHOCARDIOGRAM COMPLETE  Result Date: 11/04/2020    ECHOCARDIOGRAM REPORT   Patient Name:   Marisa Sherman Date of Exam: 11/04/2020 Medical Rec #:  [id number]    Height:       66.0 in Accession #:    [id number]   Weight:       185.9 lb Date of Birth:  06/11/1960    BSA:          1.939 m Patient Age:    60 years     BP:           122/75 mmHg Patient Gender: F            HR:           58 bpm. Exam Location:  Inpatient Procedure: 2D Echo Indications:    chest pain  History:        Patient has no prior history of Echocardiogram examinations.                 Risk Factors:Hypertension, Dyslipidemia and Current Smoker.  Sonographer:    Delcie Roch RDCS Referring Phys: [id number] JUSTIN B HOWERTER IMPRESSIONS  1. Left ventricular ejection fraction, by estimation, is 60 to 65%. The left ventricle has normal function. The left ventricle has no regional wall motion abnormalities. There is mild asymmetric left ventricular hypertrophy of the basal and septal segments. Left ventricular diastolic parameters were normal.  2. Right ventricular systolic function is normal. The right ventricular size is normal.  3. The mitral valve is abnormal. Trivial mitral valve regurgitation. No evidence of mitral stenosis.  4. The aortic valve is tricuspid. There is mild to moderate of the aortic valve. Aortic valve regurgitation is not visualized. Mild to moderate aortic valve sclerosis/calcification is present, without any evidence of aortic stenosis.  5. The inferior vena cava is normal in size with greater than 50% respiratory variability, suggesting right atrial pressure of 3 mmHg. FINDINGS  Left Ventricle: Left ventricular ejection fraction, by estimation, is 60  to 65%. The left ventricle has normal function. The left ventricle has no regional wall motion abnormalities. The left ventricular internal cavity size was normal in size. There is  mild  asymmetric left ventricular hypertrophy of the basal and septal segments. Left ventricular diastolic parameters were normal. Right Ventricle: The right ventricular size is normal. No increase in right ventricular wall thickness. Right ventricular systolic function is normal. Left Atrium: Left atrial size was normal in size. Right Atrium: Right atrial size was normal in size. Pericardium: There is no evidence of pericardial effusion. Mitral Valve: The mitral valve is abnormal. There is mild thickening of the mitral valve leaflet(s). There is mild calcification of the mitral valve leaflet(s). Trivial mitral valve regurgitation. No evidence of mitral valve stenosis. Tricuspid Valve: The tricuspid valve is normal in structure. Tricuspid valve regurgitation is mild . No evidence of tricuspid stenosis. Aortic Valve: The aortic valve is tricuspid. There is mild to moderate of the aortic valve. Aortic valve regurgitation is not visualized. Mild to moderate aortic valve sclerosis/calcification is present, without any evidence of aortic stenosis. Pulmonic Valve: The pulmonic valve was normal in structure. Pulmonic valve regurgitation is not visualized. No evidence of pulmonic stenosis. Aorta: The aortic root is normal in size and structure. Venous: The inferior vena cava is normal in size with greater than 50% respiratory variability, suggesting right atrial pressure of 3 mmHg. IAS/Shunts: No atrial level shunt detected by color flow Doppler.  LEFT VENTRICLE PLAX 2D LVIDd:         3.80 cm   Diastology LVIDs:         2.40 cm   LV e' medial:    6.31 cm/s LV PW:         1.00 cm   LV E/e' medial:  12.9 LV IVS:        1.20 cm   LV e' lateral:   11.50 cm/s LVOT diam:     1.90 cm   LV E/e' lateral: 7.1 LV SV:         104 LV SV Index:   54 LVOT Area:     2.84 cm  RIGHT VENTRICLE             IVC RV S prime:     11.60 cm/s  IVC diam: 2.10 cm TAPSE (M-mode): 2.5 cm LEFT ATRIUM             Index        RIGHT ATRIUM           Index LA  diam:        3.60 cm 1.86 cm/m   RA Area:     12.30 cm LA Vol (A2C):   54.0 ml 27.85 ml/m  RA Volume:   30.10 ml  15.53 ml/m LA Vol (A4C):   50.8 ml 26.20 ml/m LA Biplane Vol: 54.0 ml 27.85 ml/m  AORTIC VALVE LVOT Vmax:   157.00 cm/s LVOT Vmean:  106.000 cm/s LVOT VTI:    0.366 m  AORTA Ao Root diam: 2.90 cm Ao Asc diam:  3.30 cm MITRAL VALVE MV Area (PHT): 2.77 cm    SHUNTS MV Decel Time: 274 msec    Systemic VTI:  0.37 m MV E velocity: 81.20 cm/s  Systemic Diam: 1.90 cm MV A velocity: 54.80 cm/s MV E/A ratio:  1.48 Charlton Haws MD Electronically signed by Charlton Haws MD Signature Date/Time: 11/04/2020/2:11:45 PM    Final     Pending Labs Unresulted Labs (From admission, onward)  None       Vitals/Pain Today's Vitals   11/04/20 1230 11/04/20 1315 11/04/20 1330 11/04/20 1331  BP: 122/75 105/77 103/75   Pulse: 62 65 61   Resp: 13 (!) 21 18   Temp: 98.2 F (36.8 C)     TempSrc: Oral     SpO2: 98% 99% 96%   PainSc: 0-No pain   0-No pain    Isolation Precautions No active isolations  Medications Medications  acetaminophen (TYLENOL) tablet 650 mg (650 mg Oral Given 11/04/20 0958)    Or  acetaminophen (TYLENOL) suppository 650 mg ( Rectal See Alternative 11/04/20 0958)  nitroGLYCERIN (NITROSTAT) SL tablet 0.4 mg (has no administration in time range)  aspirin EC tablet 81 mg (has no administration in time range)  atorvastatin (LIPITOR) tablet 40 mg (has no administration in time range)  FLUoxetine (PROZAC) capsule 40 mg (40 mg Oral Given 11/04/20 0958)  hydrOXYzine (ATARAX/VISTARIL) tablet 25 mg (has no administration in time range)  lisinopril (ZESTRIL) tablet 20 mg (has no administration in time range)  pantoprazole (PROTONIX) EC tablet 40 mg (40 mg Oral Given 11/04/20 0958)  QUEtiapine (SEROQUEL) tablet 100 mg (has no administration in time range)  nicotine (NICODERM CQ - dosed in mg/24 hours) patch 21 mg (has no administration in time range)  lisinopril (ZESTRIL) tablet  20 mg (20 mg Oral Given 11/04/20 0106)  acetaminophen (TYLENOL) tablet 650 mg (650 mg Oral Given 11/04/20 0106)  cloNIDine (CATAPRES) tablet 0.2 mg (0.2 mg Oral Given 11/04/20 0222)  aspirin tablet 325 mg (325 mg Oral Given 11/04/20 0538)  potassium chloride SA (KLOR-CON) CR tablet 40 mEq (40 mEq Oral Given 11/04/20 0538)    Mobility walks Low fall risk   Focused Assessments    R Recommendations: See Admitting Provider Note  Report given to:   Additional Notes:

## 2020-11-04 NOTE — ED Notes (Signed)
Patient is talkative, sitting up in bed. Son at bedside.

## 2020-11-04 NOTE — Discharge Summary (Signed)
Physician Discharge Summary  Marisa Sherman VWU:[id number] DOB: December 23, 1960 DOA: 11/03/2020  PCP: Philip Aspen, Limmie Patricia, MD  Admit date: 11/03/2020 Discharge date: 11/04/2020  Admitted From: Home Disposition: Home Home  Recommendations for Outpatient Follow-up:  Follow up with PCP in 1-2 weeks Check your blood pressures at home and keep a logbook and bring it to your doctor's office visit.  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  She had mostly substernal chest pressure sensation that would radiate to both arms and neck, aggravated by movement of the arms.  She had been under a lot of pressure and also not taken her blood pressure medication for 2 days.  Hemodynamically stable in the ER.  EKG and troponins nonischemic.  Chest x-ray normal.  Skeletal survey thoracic spine with multilevel degenerative changes.  Initial blood pressure 199/106 improved dramatically with resuming her lisinopril 20 mg and given a 1 dose of clonidine 0.2 mg.  Chest pain and back pain improved with 1 dose of Tylenol 650 mg.  Advised monitoring for acute coronary syndrome.  Acute coronary syndrome ruled out.  Initial EKG and subsequent EKG, initial troponins and subsequent troponins are nonischemic.  2D echocardiogram essentially normal.  No wall motion abnormalities.  Patient without any chest pain or body pain after receiving 1 dose of Tylenol.  D-dimer 0.6 with no clinical evidence of pulmonary embolism.  This was mostly musculoskeletal pain.  She also presented with accelerated hypertension.  Patient  currently asymptomatic.  Acute coronary syndrome ruled out.  She is already on aspirin 81 mg daily, she will resume her lisinopril 20 mg daily and take it consistently.  No change in therapy needed.  Advised her to continue taking medications consistently, low-salt diet and lifestyle modifications.  We discussed in detail about his smoking cessation.  Patient tells me that she is 8 months sober from using crack cocaine and does have to smoke so that she does not go back to doing cocaine.  She is in a right path to become sober and change her lifestyle.  Discharged home today from emergency room as she is asymptomatic.  Discharge Diagnoses:  Principal Problem:   Chest pain Active Problems:   Anxiety   Tobacco abuse   Essential hypertension   Hyperlipidemia   GERD (gastroesophageal reflux disease)   Hypokalemia   Leukocytosis    Discharge Instructions  Discharge Instructions     Call MD for:  difficulty breathing, headache or visual disturbances   Complete by: As directed    Call MD for:  persistant dizziness or light-headedness   Complete by: As directed    Call MD for:  severe uncontrolled pain   Complete by: As directed    Diet - low sodium heart healthy   Complete by: As directed    Increase activity slowly   Complete by: As directed       Allergies as of 11/04/2020       Reactions   Codeine Nausea Only   Diphenhydramine Hcl Other (See Comments)   Pt unsure why   Lithium Itching   Sulfa Antibiotics Nausea And Vomiting   Amoxicillin    N & V  Medication List     TAKE these medications    acetaminophen 500 MG tablet Commonly known as: TYLENOL Take 1,000 mg by mouth every 6 (six) hours as needed for mild pain.   allopurinol 100 MG tablet Commonly known as: ZYLOPRIM Take 1 tablet (100 mg total) by mouth daily.   aspirin EC 81 MG tablet Take 81 mg by mouth daily.   atorvastatin 40 MG tablet Commonly known as: LIPITOR Take 1 tablet (40 mg total) by  mouth daily at 6 PM.   buPROPion 150 MG 12 hr tablet Commonly known as: WELLBUTRIN SR Take 150 mg by mouth 2 (two) times daily.   FLUoxetine 40 MG capsule Commonly known as: PROZAC Take 1 capsule (40 mg total) by mouth every morning.   hydrOXYzine 25 MG capsule Commonly known as: VISTARIL Take 1 capsule (25 mg total) by mouth every 8 (eight) hours as needed for anxiety.   lisinopril 20 MG tablet Commonly known as: ZESTRIL Take 1 tablet (20 mg total) by mouth daily.   pantoprazole 40 MG tablet Commonly known as: PROTONIX Take 1 tablet (40 mg total) by mouth daily.   QUEtiapine 100 MG tablet Commonly known as: SEROQUEL Take 1 tablet (100 mg total) by mouth at bedtime.   sucralfate 1 g tablet Commonly known as: CARAFATE Take 1 g by mouth 3 (three) times daily.   traZODone 100 MG tablet Commonly known as: DESYREL TAKE 2 TABLETS BY MOUTH AT BEDTIME AS NEEDED SLEEP What changed:  how much to take how to take this when to take this reasons to take this additional instructions        Follow-up Information     Philip Aspen, Limmie Patricia, MD Follow up in 2 week(s).   Specialty: Internal Medicine Contact information: [address] [place] [address] [phone]                Allergies  Allergen Reactions   Codeine Nausea Only   Diphenhydramine Hcl Other (See Comments)    Pt unsure why   Lithium Itching   Sulfa Antibiotics Nausea And Vomiting   Amoxicillin     N & V    Consultations: None   Procedures/Studies: DG Chest 1 View  Result Date: 11/03/2020 CLINICAL DATA:  Left-sided chest pain, short of breath, nausea EXAM: CHEST  1 VIEW COMPARISON:  03/24/2020 FINDINGS: The heart size and mediastinal contours are within normal limits. Both lungs are clear. The visualized skeletal structures are unremarkable. IMPRESSION: No active disease. Electronically Signed   By: Sharlet Salina M.D.   On: 11/03/2020 18:41   DG Thoracic Spine 2  View  Result Date: 11/04/2020 CLINICAL DATA:  Mid back pain radiating to the chest and down the bilateral arms. EXAM: THORACIC SPINE 2 VIEWS COMPARISON:  None. FINDINGS: There is no evidence of thoracic spine fracture. Alignment is normal. Mild multilevel endplate sclerosis is seen throughout the thoracic spine with mild multilevel intervertebral disc space narrowing. Radiopaque surgical clips are seen within the right upper quadrant. IMPRESSION: Mild multilevel degenerative changes in the thoracic spine. Electronically Signed   By: Aram Candela M.D.   On: 11/04/2020 01:25   ECHOCARDIOGRAM COMPLETE  Result Date: 11/04/2020    ECHOCARDIOGRAM REPORT   Patient Name:   Marisa Sherman Date of Exam: 11/04/2020 Medical Rec #:  [id number]    Height:       66.0 in Accession #:    [id number]   Weight:       185.9 lb  Date of Birth:  12-30-1960    BSA:          1.939 m Patient Age:    60 years     BP:           122/75 mmHg Patient Gender: F            HR:           58 bpm. Exam Location:  Inpatient Procedure: 2D Echo Indications:    chest pain  History:        Patient has no prior history of Echocardiogram examinations.                 Risk Factors:Hypertension, Dyslipidemia and Current Smoker.  Sonographer:    Delcie Roch RDCS Referring Phys: [id number] JUSTIN B HOWERTER IMPRESSIONS  1. Left ventricular ejection fraction, by estimation, is 60 to 65%. The left ventricle has normal function. The left ventricle has no regional wall motion abnormalities. There is mild asymmetric left ventricular hypertrophy of the basal and septal segments. Left ventricular diastolic parameters were normal.  2. Right ventricular systolic function is normal. The right ventricular size is normal.  3. The mitral valve is abnormal. Trivial mitral valve regurgitation. No evidence of mitral stenosis.  4. The aortic valve is tricuspid. There is mild to moderate of the aortic valve. Aortic valve regurgitation is not visualized. Mild to  moderate aortic valve sclerosis/calcification is present, without any evidence of aortic stenosis.  5. The inferior vena cava is normal in size with greater than 50% respiratory variability, suggesting right atrial pressure of 3 mmHg. FINDINGS  Left Ventricle: Left ventricular ejection fraction, by estimation, is 60 to 65%. The left ventricle has normal function. The left ventricle has no regional wall motion abnormalities. The left ventricular internal cavity size was normal in size. There is  mild asymmetric left ventricular hypertrophy of the basal and septal segments. Left ventricular diastolic parameters were normal. Right Ventricle: The right ventricular size is normal. No increase in right ventricular wall thickness. Right ventricular systolic function is normal. Left Atrium: Left atrial size was normal in size. Right Atrium: Right atrial size was normal in size. Pericardium: There is no evidence of pericardial effusion. Mitral Valve: The mitral valve is abnormal. There is mild thickening of the mitral valve leaflet(s). There is mild calcification of the mitral valve leaflet(s). Trivial mitral valve regurgitation. No evidence of mitral valve stenosis. Tricuspid Valve: The tricuspid valve is normal in structure. Tricuspid valve regurgitation is mild . No evidence of tricuspid stenosis. Aortic Valve: The aortic valve is tricuspid. There is mild to moderate of the aortic valve. Aortic valve regurgitation is not visualized. Mild to moderate aortic valve sclerosis/calcification is present, without any evidence of aortic stenosis. Pulmonic Valve: The pulmonic valve was normal in structure. Pulmonic valve regurgitation is not visualized. No evidence of pulmonic stenosis. Aorta: The aortic root is normal in size and structure. Venous: The inferior vena cava is normal in size with greater than 50% respiratory variability, suggesting right atrial pressure of 3 mmHg. IAS/Shunts: No atrial level shunt detected by color  flow Doppler.  LEFT VENTRICLE PLAX 2D LVIDd:         3.80 cm   Diastology LVIDs:         2.40 cm   LV e' medial:    6.31 cm/s LV PW:         1.00 cm   LV E/e' medial:  12.9 LV IVS:  1.20 cm   LV e' lateral:   11.50 cm/s LVOT diam:     1.90 cm   LV E/e' lateral: 7.1 LV SV:         104 LV SV Index:   54 LVOT Area:     2.84 cm  RIGHT VENTRICLE             IVC RV S prime:     11.60 cm/s  IVC diam: 2.10 cm TAPSE (M-mode): 2.5 cm LEFT ATRIUM             Index        RIGHT ATRIUM           Index LA diam:        3.60 cm 1.86 cm/m   RA Area:     12.30 cm LA Vol (A2C):   54.0 ml 27.85 ml/m  RA Volume:   30.10 ml  15.53 ml/m LA Vol (A4C):   50.8 ml 26.20 ml/m LA Biplane Vol: 54.0 ml 27.85 ml/m  AORTIC VALVE LVOT Vmax:   157.00 cm/s LVOT Vmean:  106.000 cm/s LVOT VTI:    0.366 m  AORTA Ao Root diam: 2.90 cm Ao Asc diam:  3.30 cm MITRAL VALVE MV Area (PHT): 2.77 cm    SHUNTS MV Decel Time: 274 msec    Systemic VTI:  0.37 m MV E velocity: 81.20 cm/s  Systemic Diam: 1.90 cm MV A velocity: 54.80 cm/s MV E/A ratio:  1.48 Charlton Haws MD Electronically signed by Charlton Haws MD Signature Date/Time: 11/04/2020/2:11:45 PM    Final    (Echo, Carotid, EGD, Colonoscopy, ERCP)    Subjective: Patient seen and examined.  She was admitted early morning.  She tells me that once her blood pressure is controlled and was given 1 dose of Tylenol she has no more pain.  She thinks she has been under a lot of stress and she has been missing medication doses.  Eager to go home.   Discharge Exam: Vitals:   11/04/20 1330 11/04/20 1430  BP: 103/75 120/88  Pulse: 61 (!) 56  Resp: 18 (!) 21  Temp:    SpO2: 96% 99%   Vitals:   11/04/20 1230 11/04/20 1315 11/04/20 1330 11/04/20 1430  BP: 122/75 105/77 103/75 120/88  Pulse: 62 65 61 (!) 56  Resp: 13 (!) 21 18 (!) 21  Temp: 98.2 F (36.8 C)     TempSrc: Oral     SpO2: 98% 99% 96% 99%    General: Pt is alert, awake, not in acute distress Cardiovascular: RRR, S1/S2 +, no  rubs, no gallops Respiratory: CTA bilaterally, no wheezing, no rhonchi Abdominal: Soft, NT, ND, bowel sounds + Extremities: no edema, no cyanosis    The results of significant diagnostics from this hospitalization (including imaging, microbiology, ancillary and laboratory) are listed below for reference.     Microbiology: Recent Results (from the past 240 hour(s))  Resp Panel by RT-PCR (Flu A&B, Covid) Nasopharyngeal Swab     Status: None   Collection Time: 11/04/20  4:41 AM   Specimen: Nasopharyngeal Swab; Nasopharyngeal(NP) swabs in vial transport medium  Result Value Ref Range Status   SARS Coronavirus 2 by RT PCR NEGATIVE NEGATIVE Final    Comment: (NOTE) SARS-CoV-2 target nucleic acids are NOT DETECTED.  The SARS-CoV-2 RNA is generally detectable in upper respiratory specimens during the acute phase of infection. The lowest concentration of SARS-CoV-2 viral copies this assay can detect is 138 copies/mL. A negative result  does not preclude SARS-Cov-2 infection and should not be used as the sole basis for treatment or other patient management decisions. A negative result may occur with  improper specimen collection/handling, submission of specimen other than nasopharyngeal swab, presence of viral mutation(s) within the areas targeted by this assay, and inadequate number of viral copies(<138 copies/mL). A negative result must be combined with clinical observations, patient history, and epidemiological information. The expected result is Negative.  Fact Sheet for Patients:  BloggerCourse.com  Fact Sheet for Healthcare Providers:  SeriousBroker.it  This test is no t yet approved or cleared by the [place] FDA and  has been authorized for detection and/or diagnosis of SARS-CoV-2 by FDA under an Emergency Use Authorization (EUA). This EUA will remain  in effect (meaning this test can be used) for the duration of  the COVID-19 declaration under Section 564(b)(1) of the Act, 21 U.S.C.section 360bbb-3(b)(1), unless the authorization is terminated  or revoked sooner.       Influenza A by PCR NEGATIVE NEGATIVE Final   Influenza B by PCR NEGATIVE NEGATIVE Final    Comment: (NOTE) The Xpert Xpress SARS-CoV-2/FLU/RSV plus assay is intended as an aid in the diagnosis of influenza from Nasopharyngeal swab specimens and should not be used as a sole basis for treatment. Nasal washings and aspirates are unacceptable for Xpert Xpress SARS-CoV-2/FLU/RSV testing.  Fact Sheet for Patients: BloggerCourse.com  Fact Sheet for Healthcare Providers: SeriousBroker.it  This test is not yet approved or cleared by the [place] FDA and has been authorized for detection and/or diagnosis of SARS-CoV-2 by FDA under an Emergency Use Authorization (EUA). This EUA will remain in effect (meaning this test can be used) for the duration of the COVID-19 declaration under Section 564(b)(1) of the Act, 21 U.S.C. section 360bbb-3(b)(1), unless the authorization is terminated or revoked.  Performed at [facility], [address], [place], [place] [address]      Labs: BNP (last 3 results) No results for input(s): BNP in the last 8760 hours. Basic Metabolic Panel: Recent Labs  Lab 11/03/20 1738 11/04/20 0515 11/04/20 0606  NA 140  --  137  K 3.2*  --  3.6  CL 105  --  105  CO2 26  --  24  GLUCOSE 107*  --  130*  BUN 6*  --  7*  CREATININE 0.69  --  0.71  CALCIUM 9.0  --  8.5*  MG  --  1.9  --    Liver Function Tests: Recent Labs  Lab 11/04/20 0606  AST 22  ALT 18  ALKPHOS 60  BILITOT 0.6  PROT 5.9*  ALBUMIN 3.2*   Recent Labs  Lab 11/04/20 0515  LIPASE 30   No results for input(s): AMMONIA in the last 168 hours. CBC: Recent Labs  Lab 11/03/20 1738 11/04/20 0606  WBC 11.4* 9.3  NEUTROABS  --  5.3  HGB 13.5 11.8*  HCT 40.5 35.6*   MCV 86.0 87.3  PLT 299 252   Cardiac Enzymes: No results for input(s): CKTOTAL, CKMB, CKMBINDEX, TROPONINI in the last 168 hours. BNP: Invalid input(s): POCBNP CBG: No results for input(s): GLUCAP in the last 168 hours. D-Dimer Recent Labs    11/04/20 0606  DDIMER 0.68*   Hgb A1c No results for input(s): HGBA1C in the last 72 hours. Lipid Profile No results for input(s): CHOL, HDL, LDLCALC, TRIG, CHOLHDL, LDLDIRECT in the last 72 hours. Thyroid function studies No results for input(s): TSH, T4TOTAL, T3FREE, THYROIDAB in the  last 72 hours.  Invalid input(s): FREET3 Anemia work up No results for input(s): VITAMINB12, FOLATE, FERRITIN, TIBC, IRON, RETICCTPCT in the last 72 hours. Urinalysis    Component Value Date/Time   COLORURINE YELLOW 11/04/2020 0633   APPEARANCEUR HAZY (A) 11/04/2020 0633   LABSPEC 1.010 11/04/2020 0633   PHURINE 6.0 11/04/2020 0633   GLUCOSEU NEGATIVE 11/04/2020 0633   HGBUR NEGATIVE 11/04/2020 0633   BILIRUBINUR NEGATIVE 11/04/2020 0633   BILIRUBINUR Neg 08/10/2018 1039   KETONESUR NEGATIVE 11/04/2020 0633   PROTEINUR NEGATIVE 11/04/2020 0633   UROBILINOGEN 0.2 08/10/2018 1039   NITRITE NEGATIVE 11/04/2020 0633   LEUKOCYTESUR NEGATIVE 11/04/2020 1594   Sepsis Labs Invalid input(s): PROCALCITONIN,  WBC,  LACTICIDVEN Microbiology Recent Results (from the past 240 hour(s))  Resp Panel by RT-PCR (Flu A&B, Covid) Nasopharyngeal Swab     Status: None   Collection Time: 11/04/20  4:41 AM   Specimen: Nasopharyngeal Swab; Nasopharyngeal(NP) swabs in vial transport medium  Result Value Ref Range Status   SARS Coronavirus 2 by RT PCR NEGATIVE NEGATIVE Final    Comment: (NOTE) SARS-CoV-2 target nucleic acids are NOT DETECTED.  The SARS-CoV-2 RNA is generally detectable in upper respiratory specimens during the acute phase of infection. The lowest concentration of SARS-CoV-2 viral copies this assay can detect is 138 copies/mL. A negative result does  not preclude SARS-Cov-2 infection and should not be used as the sole basis for treatment or other patient management decisions. A negative result may occur with  improper specimen collection/handling, submission of specimen other than nasopharyngeal swab, presence of viral mutation(s) within the areas targeted by this assay, and inadequate number of viral copies(<138 copies/mL). A negative result must be combined with clinical observations, patient history, and epidemiological information. The expected result is Negative.  Fact Sheet for Patients:  BloggerCourse.com  Fact Sheet for Healthcare Providers:  SeriousBroker.it  This test is no t yet approved or cleared by the [place] FDA and  has been authorized for detection and/or diagnosis of SARS-CoV-2 by FDA under an Emergency Use Authorization (EUA). This EUA will remain  in effect (meaning this test can be used) for the duration of the COVID-19 declaration under Section 564(b)(1) of the Act, 21 U.S.C.section 360bbb-3(b)(1), unless the authorization is terminated  or revoked sooner.       Influenza A by PCR NEGATIVE NEGATIVE Final   Influenza B by PCR NEGATIVE NEGATIVE Final    Comment: (NOTE) The Xpert Xpress SARS-CoV-2/FLU/RSV plus assay is intended as an aid in the diagnosis of influenza from Nasopharyngeal swab specimens and should not be used as a sole basis for treatment. Nasal washings and aspirates are unacceptable for Xpert Xpress SARS-CoV-2/FLU/RSV testing.  Fact Sheet for Patients: BloggerCourse.com  Fact Sheet for Healthcare Providers: SeriousBroker.it  This test is not yet approved or cleared by the [place] FDA and has been authorized for detection and/or diagnosis of SARS-CoV-2 by FDA under an Emergency Use Authorization (EUA). This EUA will remain in effect (meaning this test can be used) for the  duration of the COVID-19 declaration under Section 564(b)(1) of the Act, 21 U.S.C. section 360bbb-3(b)(1), unless the authorization is terminated or revoked.  Performed at [facility], [address], [place], [place] [address]      Time coordinating discharge:  28 minutes  SIGNED:   Dorcas Carrow, MD  [facility] 11/04/2020, 3:14 PM

## 2020-11-04 NOTE — ED Provider Notes (Signed)
Emergency Medicine Provider Triage Evaluation Note  Marisa Sherman , a 60 y.o. female  was evaluated in triage.  Pt complains of chest pain.  The patient reports that she has developed sharp, stabbing chest pain in the center of her chest.  She also reports neck pain pain radiating down her bilateral arms that has been aching more over the last few days.  She has no history of CAD.  No shortness of breath, fever, chills, visual changes, numbness, weakness.  She has a history of hypertension, but has not taken her home lisinopril today.  She has a remote history of crack cocaine use, but reports that she has been clean for more than 8 months.  She denies any IV drug use.  No fever, chills, cough, leg swelling, abdominal pain, rash.  Review of Systems  Positive: Chest pain, neck pain, arm pain Negative: Neck stiffness, fever, chills, cough, leg swelling, abdominal pain, rash  Physical Exam  BP 127/90   Pulse 60   Temp 98.7 F (37.1 C) (Oral)   Resp 17   SpO2 95%  Gen:   Awake, no distress   Resp:  Normal effort  MSK:   Moves extremities without difficulty  Other:  Tender to palpation to the thoracic spinous processes without crepitus or step-offs.  No reproducible tenderness palpation to the chest wall.  Medical Decision Making  Medically screening exam initiated at 3:28 AM.  Appropriate orders placed.  Marisa Sherman was informed that the remainder of the evaluation will be completed by another provider, this initial triage assessment does not replace that evaluation, and the importance of remaining in the ED until their evaluation is complete.  Labs and imaging have been ordered.  She will require further work-up evaluation in the emergency department.   Frederik Pear A, PA-C 11/04/20 0328    Gilda Crease, MD 11/04/20 0400

## 2020-11-04 NOTE — ED Notes (Signed)
Breakfast order placed ?

## 2020-11-04 NOTE — Progress Notes (Signed)
  Echocardiogram 2D Echocardiogram has been performed.  Delcie Roch 11/04/2020, 2:13 PM
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
## 2020-11-05 ENCOUNTER — Telehealth (HOSPITAL_COMMUNITY): Payer: Self-pay | Admitting: *Deleted

## 2020-11-05 NOTE — Telephone Encounter (Signed)
Call from patient requesting medicine. She has not been seen since June and shortly after her last visit Dr Evelene Croon left this practice and she was told she would be seeing another provider but she doesn't have a future appt. Told her she would first have to make an appt to know how long to bridge her medicines if the provider would do it at all. She has been out of meds for awhile. When I transferred to front desk to schedule her they were able to fit her in tomorrow under a recent cancellization so no need to bridge her.

## 2020-11-06 ENCOUNTER — Ambulatory Visit (INDEPENDENT_AMBULATORY_CARE_PROVIDER_SITE_OTHER): Payer: Medicaid Other

## 2020-11-06 ENCOUNTER — Telehealth (INDEPENDENT_AMBULATORY_CARE_PROVIDER_SITE_OTHER): Payer: Medicare Other | Admitting: Physician Assistant

## 2020-11-06 DIAGNOSIS — F1994 Other psychoactive substance use, unspecified with psychoactive substance-induced mood disorder: Secondary | ICD-10-CM

## 2020-11-06 DIAGNOSIS — K219 Gastro-esophageal reflux disease without esophagitis: Secondary | ICD-10-CM

## 2020-11-06 DIAGNOSIS — F4310 Post-traumatic stress disorder, unspecified: Secondary | ICD-10-CM | POA: Diagnosis not present

## 2020-11-06 DIAGNOSIS — F419 Anxiety disorder, unspecified: Secondary | ICD-10-CM

## 2020-11-06 DIAGNOSIS — G4700 Insomnia, unspecified: Secondary | ICD-10-CM

## 2020-11-06 DIAGNOSIS — I10 Essential (primary) hypertension: Secondary | ICD-10-CM

## 2020-11-06 MED ORDER — BUPROPION HCL ER (SR) 150 MG PO TB12: 150.0000 mg | ORAL_TABLET | Freq: Two times a day (BID) | ORAL | 2 refills | Status: DC

## 2020-11-06 MED ORDER — HYDROXYZINE PAMOATE 25 MG PO CAPS: 25.0000 mg | ORAL_CAPSULE | Freq: Three times a day (TID) | ORAL | 1 refills | Status: DC | PRN

## 2020-11-06 MED ORDER — TRAZODONE HCL 100 MG PO TABS: ORAL_TABLET | ORAL | 2 refills | Status: DC

## 2020-11-06 MED ORDER — FLUOXETINE HCL 40 MG PO CAPS: 40.0000 mg | ORAL_CAPSULE | Freq: Every morning | ORAL | 2 refills | Status: DC

## 2020-11-06 MED ORDER — QUETIAPINE FUMARATE 150 MG PO TABS: 150.0000 mg | ORAL_TABLET | Freq: Every day | ORAL | 2 refills | Status: DC

## 2020-11-06 NOTE — Progress Notes (Addendum)
BH MD/PA/NP OP Progress Note  Virtual Visit via Telephone Note  I connected with Marisa Sherman on 11/06/20 at  2:00 PM EDT by telephone and verified that I am speaking with the correct person using two identifiers.  Location: Patient: Home Provider: Clinic   I discussed the limitations, risks, security and privacy concerns of performing an evaluation and management service by telephone and the availability of in person appointments. I also discussed with the patient that there may be a patient responsible charge related to this service. The patient expressed understanding and agreed to proceed.  Follow Up Instructions:   I discussed the assessment and treatment plan with the patient. The patient was provided an opportunity to ask questions and all were answered. The patient agreed with the plan and demonstrated an understanding of the instructions.   The patient was advised to call back or seek an in-person evaluation if the symptoms worsen or if the condition fails to improve as anticipated.  I provided 17 minutes of non-face-to-face time during this encounter.  Meta Hatchet, PA    11/09/2020 11:45 PM Marisa Sherman  MRN:  [id number]  Chief Complaint: Follow up and medication management  HPI:   Marisa Sherman is a 60 year old female with a past psychiatric history significant for insomnia, substance-induced mood disorder, anxiety, and PTSD who presents to [facility] via virtual telephone visit for follow-up and medication management.  Patient is currently being managed on the following medications:  Prozac 40 mg daily Wellbutrin 150 mg 12-hour tablet 2 times daily Hydroxyzine 25 mg 3 times daily as needed Seroquel 100 mg at bedtime  Patient reports that her medications have been going well and reports no issues or concerns at this time.  Patient is requesting refills following the conclusion of the encounter.  Patient endorses elevated  anxiety due to being without her medications.  Patient rates her anxiety at 4 out of 10 stating that her anxiety is mostly attributed to stressors within her house.  Patient denies any new stressors at this time.  A PHQ-9 screen was performed with the patient scoring a 10.  A GAD-7 screen was also performed with the patient scoring a 13.  Patient is alert and oriented x4, pleasant, calm, cooperative, and fully engaged in conversation during the encounter.  Patient endorses being in a good mood.  Patient denies suicidal or homicidal ideations.  She further denies auditory or visual hallucinations and does not appear to be responding to internal/external stimuli.  Patient endorses fair sleep and receives on average 4 to 5 hours of sleep each night.  Patient endorses good appetite and eats on average 2 meals per day.  Patient denies alcohol consumption and illicit drug use.  Patient endorses tobacco use and smokes on average a pack per day.  Patient reports that she has been 8 months clean from illicit substances since being in a sober house.  Visit Diagnosis:    ICD-10-CM   1. Insomnia, unspecified type  G47.00 traZODone (DESYREL) 100 MG tablet    2. Substance induced mood disorder (HCC)  F19.94 QUEtiapine 150 MG TABS    buPROPion (WELLBUTRIN SR) 150 MG 12 hr tablet    FLUoxetine (PROZAC) 40 MG capsule    3. Anxiety  F41.9 hydrOXYzine (VISTARIL) 25 MG capsule    4. PTSD (post-traumatic stress disorder)  F43.10 FLUoxetine (PROZAC) 40 MG capsule      Past Psychiatric History:  Bipolar d/o, substance induced mood disorder, PTSD, anxiety. Was  bring seen at [facility] in [place] for past 4-5 years  Past Medical History:  Past Medical History:  Diagnosis Date   Abnormal Pap smear of cervix    Allergy    Anxiety    Bipolar disorder (HCC)    Cataract    Degenerative disc disease at L5-S1 level    Depression    GERD (gastroesophageal reflux disease)    Gout    Hyperlipidemia     Hypertension    Overactive bladder     Past Surgical History:  Procedure Laterality Date   CARPAL TUNNEL RELEASE Bilateral    CERVIX LESION DESTRUCTION  1985   CHOLECYSTECTOMY     DILATION AND CURETTAGE OF UTERUS     TONSILLECTOMY     TUBAL LIGATION      Family Psychiatric History:  History of depression, anxiety in mother.  She also informed history of anxiety and depression in her siblings.  Family History:  Family History  Problem Relation Age of Onset   Depression Mother    Anxiety disorder Mother    Asthma Mother    Bipolar disorder Father    Depression Sister    Anxiety disorder Sister    Depression Brother    Anxiety disorder Brother    Depression Brother    Anxiety disorder Brother    Cancer Neg Hx    Diabetes Neg Hx    Heart disease Neg Hx    Colon cancer Neg Hx    Breast cancer Neg Hx    Ovarian cancer Neg Hx    Cervical cancer Neg Hx     Social History:  Social History   Socioeconomic History   Marital status: Divorced    Spouse name: Not on file   Number of children: 3   Years of education: Not on file   Highest education level: GED or equivalent  Occupational History   Occupation: Disablilty  Tobacco Use   Smoking status: Every Day    Packs/day: 1.00    Years: 40.00    Pack years: 40.00    Types: Cigarettes   Smokeless tobacco: Never   Tobacco comments:    started smoking at age 26; has quit on and off throughout the years. Has decreased cig use to 0.5 PPD  Vaping Use   Vaping Use: Never used  Substance and Sexual Activity   Alcohol use: Not Currently   Drug use: Not Currently    Comment: 90 days recovery   Sexual activity: Not Currently  Other Topics Concern   Not on file  Social History Narrative   Not on file   Social Determinants of Health   Financial Resource Strain: Low Risk    Difficulty of Paying Living Expenses: Not hard at all  Food Insecurity: No Food Insecurity   Worried About Programme researcher, broadcasting/film/video in the Last Year:  Never true   Ran Out of Food in the Last Year: Never true  Transportation Needs: No Transportation Needs   Lack of Transportation (Medical): No   Lack of Transportation (Non-Medical): No  Physical Activity: Sufficiently Active   Days of Exercise per Week: 7 days   Minutes of Exercise per Session: 30 min  Stress: Stress Concern Present   Feeling of Stress : Rather much  Social Connections: Moderately Isolated   Frequency of Communication with Friends and Family: Twice a week   Frequency of Social Gatherings with Friends and Family: Never   Attends Religious Services: 1 to 4 times per  year   Active Member of Clubs or Organizations: Yes   Attends Banker Meetings: More than 4 times per year   Marital Status: Divorced    Allergies:  Allergies  Allergen Reactions   Codeine Nausea Only   Diphenhydramine Hcl Other (See Comments)    Pt unsure why   Lithium Itching   Sulfa Antibiotics Nausea And Vomiting   Amoxicillin     N & V    Metabolic Disorder Labs: No results found for: HGBA1C, MPG No results found for: PROLACTIN Lab Results  Component Value Date   CHOL 165 04/13/2020   TRIG 147 04/13/2020   HDL 41 04/13/2020   CHOLHDL 4.0 04/13/2020   LDLCALC 98 04/13/2020   LDLCALC 90 04/01/2019   Lab Results  Component Value Date   TSH 2.610 04/13/2020   TSH 1.720 07/05/2013    Therapeutic Level Labs: Lab Results  Component Value Date   LITHIUM 0.50 (L) 07/10/2013   LITHIUM <0.25 (L) 07/01/2013   No results found for: VALPROATE No components found for:  CBMZ  Current Medications: Current Outpatient Medications  Medication Sig Dispense Refill   acetaminophen (TYLENOL) 500 MG tablet Take 1,000 mg by mouth every 6 (six) hours as needed for mild pain.     allopurinol (ZYLOPRIM) 100 MG tablet Take 1 tablet (100 mg total) by mouth daily. 90 tablet 0   aspirin EC 81 MG tablet Take 81 mg by mouth daily.     atorvastatin (LIPITOR) 40 MG tablet Take 1 tablet (40 mg  total) by mouth daily at 6 PM. 90 tablet 0   buPROPion (WELLBUTRIN SR) 150 MG 12 hr tablet Take 1 tablet (150 mg total) by mouth 2 (two) times daily. 60 tablet 2   FLUoxetine (PROZAC) 40 MG capsule Take 1 capsule (40 mg total) by mouth every morning. 30 capsule 2   hydrOXYzine (VISTARIL) 25 MG capsule Take 1 capsule (25 mg total) by mouth every 8 (eight) hours as needed for anxiety. 90 capsule 1   lisinopril (ZESTRIL) 20 MG tablet Take 1 tablet (20 mg total) by mouth daily. 90 tablet 0   pantoprazole (PROTONIX) 40 MG tablet Take 1 tablet (40 mg total) by mouth daily. 90 tablet 3   QUEtiapine 150 MG TABS Take 150 mg by mouth at bedtime. 30 tablet 2   sucralfate (CARAFATE) 1 g tablet Take 1 g by mouth 3 (three) times daily.     traZODone (DESYREL) 100 MG tablet TAKE 2 TABLETS BY MOUTH AT BEDTIME AS NEEDED SLEEP 30 tablet 2   No current facility-administered medications for this visit.     Musculoskeletal: Strength & Muscle Tone: Unable to assess due to telemedicine visit Gait & Station: Unable to assess due to telemedicine visit Patient leans: Unable to assess due to telemedicine visit  Psychiatric Specialty Exam: Review of Systems  Psychiatric/Behavioral:  Positive for sleep disturbance. Negative for decreased concentration, dysphoric mood, hallucinations, self-injury and suicidal ideas. The patient is nervous/anxious. The patient is not hyperactive.    There were no vitals taken for this visit.There is no height or weight on file to calculate BMI.  General Appearance: Unable to assess due to telemedicine visit  Eye Contact:  Unable to assess due to telemedicine visit  Speech:  Clear and Coherent and Normal Rate  Volume:  Normal  Mood:  Anxious  Affect:  Appropriate and Congruent  Thought Process:  Coherent, Goal Directed, and Descriptions of Associations: Intact  Orientation:  Full (Time, Place, and  Person)  Thought Content: WDL   Suicidal Thoughts:  No  Homicidal Thoughts:  No   Memory:  Immediate;   Good Recent;   Good Remote;   Good  Judgement:  Good  Insight:  Good  Psychomotor Activity:  Normal  Concentration:  Concentration: Good and Attention Span: Good  Recall:  Good  Fund of Knowledge: Good  Language: Good  Akathisia:  NA  Handed:  Right  AIMS (if indicated): not done  Assets:  Communication Skills Desire for Improvement Housing Social Support Vocational/Educational  ADL's:  Intact  Cognition: WNL  Sleep:  Fair   Screenings: AUDIT    Flowsheet Row [facility]  Alcohol Use Disorder Identification Test Final Score (AUDIT) 0      GAD-7    Flowsheet Row Video Visit from 11/06/2020 in [facility] Office Visit from 07/30/2019 in [facility]  Total GAD-7 Score 13 19      PHQ2-9    Flowsheet Row Video Visit from 11/06/2020 in [facility] Office Visit from 09/30/2020 in [facility] from 08/25/2020 in [facility] Office Visit from 07/20/2020 in [facility] Counselor from 07/08/2020 in [facility]  PHQ-2 Total Score 2 0 0 1 2  PHQ-9 Total Score 10 5 -- 12 12      Flowsheet Row Video Visit from 11/06/2020 in [facility] ED from 11/03/2020 in [facility] EMERGENCY DEPARTMENT Office Visit from 07/20/2020 in [facility]  C-SSRS RISK CATEGORY No Risk No Risk Error: Q7 should not be populated when Q6 is No        Assessment and Plan:   Marisa Sherman is a 60 year old female with a past psychiatric history significant for insomnia, substance-induced mood disorder, anxiety, and PTSD who presents to [facility] via virtual telephone visit for follow-up and medication management.   Patient reports that her mood has been well however, she has been experiencing anxiety related to trauma and stressors within the house she is living in.  Patient is requesting refills on her medications following the conclusion of the encounter.  Due to issues with her sleep, patient was recommended adjusting her Seroquel dosage from 100 mg 150 mg at bedtime for management of her sleep as well as stabilization of her mood.  Patient was agreeable to recommendation.  Patient's medications to be prescribed to pharmacy of choice.  1. Insomnia, unspecified type  - traZODone (DESYREL) 100 MG tablet; TAKE 2 TABLETS BY MOUTH AT BEDTIME AS NEEDED SLEEP  Dispense: 30 tablet; Refill: 2  2. Substance induced mood disorder (HCC)  - QUEtiapine 150 MG TABS; Take 150 mg by mouth at bedtime.  Dispense: 30 tablet; Refill: 2 - buPROPion (WELLBUTRIN SR) 150 MG 12 hr tablet; Take 1 tablet (150 mg total) by mouth 2 (two) times daily.  Dispense: 60 tablet; Refill: 2 - FLUoxetine (PROZAC) 40 MG capsule; Take 1 capsule (40 mg total) by mouth every morning.  Dispense: 30 capsule; Refill: 2  3. Anxiety  - hydrOXYzine (VISTARIL) 25 MG capsule; Take 1 capsule (25 mg total) by mouth every 8 (eight) hours as needed for anxiety.  Dispense: 90 capsule; Refill: 1  4. PTSD (post-traumatic stress disorder)  - FLUoxetine (PROZAC) 40 MG capsule; Take 1 capsule (40 mg total) by mouth every morning.  Dispense:  30 capsule; Refill: 2   Meta Hatchet, PA 11/06/2020, 2:12 PM
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
## 2020-11-06 NOTE — Patient Instructions (Addendum)
Thank you for allowing the Chronic Care Management team to participate in your care.    Patient Care Plan: Hypertension and Hyperlipidemia     Problem Identified: Hypertension and Hyperlipidemia      Long-Range Goal: Hypertension and Hyperlipidemia Monitored Completed 11/06/2020  Start Date: 08/25/2020  Expected End Date: 12/23/2020  Priority: Medium  Note:    Current Barriers:  Chronic Disease Management support and educational needs related to Hypertension and Hyperlipidemia.  Case Manager Clinical Goal(s):  Over the next 120 days, patient will demonstrate improved health management independence as evidenced by monitoring blood pressure, taking all medications as prescribe, and adhering to a low sodium diet.  Interventions:  Collaboration with Beryle Flock Marzella Schlein, MD regarding development and update of comprehensive plan of care as evidenced by provider attestation and co-signature Inter-disciplinary care team collaboration (see longitudinal plan of care) Reviewed medications and importance of compliance. Reports taking medications as prescribed and tolerating regimen. Denies concerns regarding medication management or prescription cost. Reports monitoring BP as advised. Reviewed established range and indications for notifying a provider. Reviewed s/sx of heart, stroke and worsening symptoms that require immediate medical attention.   Self-Care/Patient Goals: Self administer medications as prescribed Attend all scheduled provider appointments Monitor BP and record readings Adhere to a low sodium diet/DASH diet       Patient Care Plan: GERD     Problem Identified: Gastroesphageal Reflux Disease      Long-Range Goal: GERD Monitored Completed 11/06/2020  Start Date: 08/25/2020  Expected End Date: 11/23/2020  Priority: High  Note:   Current Barriers:  Chronic Disease Management support and educational needs related to GERD self management.  Clinical Goal(s):  Over  the  next 90 days patient will demonstrate improved adherence to treatment plan as evidenced by: taking medications as prescribed and adjusting doses as advised by the Gastroenterology team, adhering to recommended diet, and completing follow up with the Gastroenterology team as scheduled.    Interventions:  Evaluation of current treatment plan related to self-management and patient's adherence to plan as established by provider. Collaboration with Erasmo Downer, MD regarding development and update of comprehensive plan of care as evidenced by provider attestation and co-signature. Reviewed medications and compliance with treatment plan. Reports taking medications as advised and adhering to treatment plan. Reports tolerating small meals without complications. Declined need for nutritional resources. Advised to follow up with the GI provider as scheduled.  Verbalized understanding of s/sx of gastrointestinal complications and indications for seeking immediate medical attention.    Self Care/Patient Goals: Self administer medications as prescribed Seek immediate medical attention for worsening symptoms         Marisa Sherman verbalized understanding of the information discussed during the telephonic outreach. Declined need for mailed/printed instructions. She has relocated and will transition to another primary care clinic. Agreed to contact the clinic if she requires urgent assistance prior to establishing care with her new provider. The care management team will gladly assist.   [facility] Care Management [facility] [phone]

## 2020-11-09 ENCOUNTER — Encounter (HOSPITAL_COMMUNITY): Payer: Self-pay | Admitting: Physician Assistant

## 2020-11-10 ENCOUNTER — Ambulatory Visit: Payer: Medicare Other

## 2020-11-10 ENCOUNTER — Ambulatory Visit (INDEPENDENT_AMBULATORY_CARE_PROVIDER_SITE_OTHER): Payer: Medicare Other | Admitting: Internal Medicine

## 2020-11-10 ENCOUNTER — Other Ambulatory Visit: Payer: Self-pay

## 2020-11-10 ENCOUNTER — Encounter: Payer: Self-pay | Admitting: Internal Medicine

## 2020-11-10 VITALS — BP 130/90 | HR 80 | Temp 97.7°F | Wt 192.9 lb

## 2020-11-10 DIAGNOSIS — Z1231 Encounter for screening mammogram for malignant neoplasm of breast: Secondary | ICD-10-CM

## 2020-11-10 DIAGNOSIS — Z1211 Encounter for screening for malignant neoplasm of colon: Secondary | ICD-10-CM

## 2020-11-10 DIAGNOSIS — M79671 Pain in right foot: Secondary | ICD-10-CM | POA: Diagnosis not present

## 2020-11-10 DIAGNOSIS — Z23 Encounter for immunization: Secondary | ICD-10-CM | POA: Diagnosis not present

## 2020-11-10 DIAGNOSIS — M79672 Pain in left foot: Secondary | ICD-10-CM

## 2020-11-10 DIAGNOSIS — R079 Chest pain, unspecified: Secondary | ICD-10-CM | POA: Diagnosis not present

## 2020-11-10 DIAGNOSIS — Z720 Tobacco use: Secondary | ICD-10-CM | POA: Diagnosis not present

## 2020-11-10 DIAGNOSIS — M79673 Pain in unspecified foot: Secondary | ICD-10-CM

## 2020-11-10 DIAGNOSIS — Z09 Encounter for follow-up examination after completed treatment for conditions other than malignant neoplasm: Secondary | ICD-10-CM

## 2020-11-10 DIAGNOSIS — I10 Essential (primary) hypertension: Secondary | ICD-10-CM | POA: Diagnosis not present

## 2020-11-10 DIAGNOSIS — F17210 Nicotine dependence, cigarettes, uncomplicated: Secondary | ICD-10-CM

## 2020-11-10 DIAGNOSIS — F1994 Other psychoactive substance use, unspecified with psychoactive substance-induced mood disorder: Secondary | ICD-10-CM

## 2020-11-10 MED ORDER — BUPROPION HCL ER (XL) 300 MG PO TB24: 300.0000 mg | ORAL_TABLET | Freq: Every day | ORAL | 1 refills | Status: DC

## 2020-11-10 MED ORDER — LISINOPRIL 40 MG PO TABS: 40.0000 mg | ORAL_TABLET | Freq: Every day | ORAL | 1 refills | Status: DC

## 2020-11-10 NOTE — Addendum Note (Signed)
Addended by: Kern Reap B on: 11/10/2020 04:37 PM   Modules accepted: Orders

## 2020-11-10 NOTE — Patient Instructions (Signed)
-  Nice seeing you today!!  -Flu vaccine today.  -Schedule follow up in 6 weeks for your blood pressure.  -Remember to schedule counseling sessions.

## 2020-11-10 NOTE — Progress Notes (Signed)
Established Patient Office Visit     This visit occurred during the SARS-CoV-2 public health emergency.  Safety protocols were in place, including screening questions prior to the visit, additional usage of staff PPE, and extensive cleaning of exam room while observing appropriate contact time as indicated for disinfecting solutions.    CC/Reason for Visit: ED follow-up  HPI: Marisa Sherman is a 60 y.o. female who is coming in today for the above mentioned reasons. Past Medical History is significant for: Hypertension, hyperlipidemia, GERD, gout, history of peripheral neuropathy, ongoing tobacco use.  She is now 8 months sober from crack cocaine usage.  She was in the hospital from October 11 through 12 with chest pain.  At that time she had not taken blood pressure medication for 2 days.  Pressure improved with lisinopril and clonidine in the ED.  She was ruled out for acute coronary syndrome.  2D echocardiogram was normal, cardiac work-up was negative.  It was thought to be musculoskeletal in origin.  Today she is requesting flu vaccine, she is requesting referral to podiatry to update her orthotics.  She is requesting referral for colonoscopy and mammogram.  Since hospital discharge she has been taking 2 of her lisinopril 20 mg.   Past Medical/Surgical History: Past Medical History:  Diagnosis Date   Abnormal Pap smear of cervix    Allergy    Anxiety    Bipolar disorder (HCC)    Cataract    Degenerative disc disease at L5-S1 level    Depression    GERD (gastroesophageal reflux disease)    Gout    Hyperlipidemia    Hypertension    Overactive bladder     Past Surgical History:  Procedure Laterality Date   CARPAL TUNNEL RELEASE Bilateral    CERVIX LESION DESTRUCTION  1985   CHOLECYSTECTOMY     DILATION AND CURETTAGE OF UTERUS     TONSILLECTOMY     TUBAL LIGATION      Social History:  reports that she has been smoking cigarettes. She has a 40.00 pack-year smoking  history. She has never used smokeless tobacco. She reports that she does not currently use alcohol. She reports that she does not currently use drugs.  Allergies: Allergies  Allergen Reactions   Codeine Nausea Only   Diphenhydramine Hcl Other (See Comments)    Pt unsure why   Lithium Itching   Sulfa Antibiotics Nausea And Vomiting   Amoxicillin     N & V    Family History:  Family History  Problem Relation Age of Onset   Depression Mother    Anxiety disorder Mother    Asthma Mother    Bipolar disorder Father    Depression Sister    Anxiety disorder Sister    Depression Brother    Anxiety disorder Brother    Depression Brother    Anxiety disorder Brother    Cancer Neg Hx    Diabetes Neg Hx    Heart disease Neg Hx    Colon cancer Neg Hx    Breast cancer Neg Hx    Ovarian cancer Neg Hx    Cervical cancer Neg Hx      Current Outpatient Medications:    acetaminophen (TYLENOL) 500 MG tablet, Take 1,000 mg by mouth every 6 (six) hours as needed for mild pain., Disp: , Rfl:    allopurinol (ZYLOPRIM) 100 MG tablet, Take 1 tablet (100 mg total) by mouth daily., Disp: 90 tablet, Rfl: 0   aspirin  EC 81 MG tablet, Take 81 mg by mouth daily., Disp: , Rfl:    atorvastatin (LIPITOR) 40 MG tablet, Take 1 tablet (40 mg total) by mouth daily at 6 PM., Disp: 90 tablet, Rfl: 0   buPROPion (WELLBUTRIN XL) 300 MG 24 hr tablet, Take 1 tablet (300 mg total) by mouth daily., Disp: 90 tablet, Rfl: 1   FLUoxetine (PROZAC) 40 MG capsule, Take 1 capsule (40 mg total) by mouth every morning., Disp: 30 capsule, Rfl: 2   hydrOXYzine (VISTARIL) 25 MG capsule, Take 1 capsule (25 mg total) by mouth every 8 (eight) hours as needed for anxiety., Disp: 90 capsule, Rfl: 1   pantoprazole (PROTONIX) 40 MG tablet, Take 1 tablet (40 mg total) by mouth daily., Disp: 90 tablet, Rfl: 3   QUEtiapine 150 MG TABS, Take 150 mg by mouth at bedtime., Disp: 30 tablet, Rfl: 2   sucralfate (CARAFATE) 1 g tablet, Take 1 g by  mouth 3 (three) times daily., Disp: , Rfl:    traZODone (DESYREL) 100 MG tablet, TAKE 2 TABLETS BY MOUTH AT BEDTIME AS NEEDED SLEEP, Disp: 30 tablet, Rfl: 2   lisinopril (ZESTRIL) 40 MG tablet, Take 1 tablet (40 mg total) by mouth daily., Disp: 90 tablet, Rfl: 1  Review of Systems:  Constitutional: Denies fever, chills, diaphoresis, appetite change and fatigue.  HEENT: Denies photophobia, eye pain, redness, hearing loss, ear pain, congestion, sore throat, rhinorrhea, sneezing, mouth sores, trouble swallowing, neck pain, neck stiffness and tinnitus.   Respiratory: Denies SOB, DOE, cough, chest tightness,  and wheezing.   Cardiovascular: Denies chest pain, palpitations and leg swelling.  Gastrointestinal: Denies nausea, vomiting, abdominal pain, diarrhea, constipation, blood in stool and abdominal distention.  Genitourinary: Denies dysuria, urgency, frequency, hematuria, flank pain and difficulty urinating.  Endocrine: Denies: hot or cold intolerance, sweats, changes in hair or nails, polyuria, polydipsia. Musculoskeletal: Denies myalgias, back pain, joint swelling, arthralgias and gait problem.  Skin: Denies pallor, rash and wound.  Neurological: Denies dizziness, seizures, syncope, weakness, light-headedness, numbness and headaches.  Hematological: Denies adenopathy. Easy bruising, personal or family bleeding history  Psychiatric/Behavioral: Denies suicidal ideation, mood changes, confusion, nervousness, sleep disturbance and agitation    Physical Exam: Vitals:   11/10/20 0805  BP: 130/90  Pulse: 80  Temp: 97.7 F (36.5 C)  TempSrc: Oral  SpO2: 94%  Weight: 192 lb 14.4 oz (87.5 kg)    Body mass index is 31.13 kg/m.   Constitutional: NAD, calm, comfortable Eyes: PERRL, lids and conjunctivae normal ENMT: Mucous membranes are moist.  Respiratory: clear to auscultation bilaterally, no wheezing, no crackles. Normal respiratory effort. No accessory muscle use.  Cardiovascular:  Regular rate and rhythm, no murmurs / rubs / gallops. No extremity edema.  Neurologic: Grossly intact and nonfocal Psychiatric: Normal judgment and insight. Alert and oriented x 3. Normal mood.    Impression and Plan:  Hospital discharge follow-up Chest pain, unspecified type [facility] charts reviewed in great detail, it was deemed to be musculoskeletal most likely in origin after a completely negative work-up.  Essential hypertension  - Plan: lisinopril (ZESTRIL) 40 MG tablet -Agree with increasing lisinopril from 20 to 40 mg, prescription sent and she will follow-up in 6 weeks.  Tobacco abuse  -I have discussed tobacco cessation with the patient.  I have counseled the patient regarding the negative impacts of continued tobacco use including but not limited to lung cancer, COPD, and cardiovascular disease.  I have discussed alternatives to tobacco and modalities that may help facilitate tobacco  cessation including but not limited to biofeedback, hypnosis, and medications.  Total time spent with tobacco counseling was 4 minutes. - Plan: buPROPion (WELLBUTRIN XL) 300 MG 24 hr tablet -Plan to increase Wellbutrin from 150 to 300 mg. -She has been given resources on how to schedule CBT sessions.  Pain in both feet -Refer back to podiatry if she would like updated orthotics.  Need for influenza vaccination -Flu vaccine administered today.  Encounter for screening mammogram for malignant neoplasm of breast -Mammogram requested today.  Colon cancer screening -Referral to GI for initial screening colonoscopy.   Time spent: 34 minutes reviewing chart, interviewing and examining patient and formulating plan of care.   Patient Instructions  -Nice seeing you today!!  -Flu vaccine today.  -Schedule follow up in 6 weeks for your blood pressure.  -Remember to schedule counseling sessions.    Chaya Jan, MD [facility]

## 2020-11-14 DIAGNOSIS — R0981 Nasal congestion: Secondary | ICD-10-CM | POA: Diagnosis not present

## 2020-11-14 DIAGNOSIS — J209 Acute bronchitis, unspecified: Secondary | ICD-10-CM | POA: Diagnosis not present

## 2020-11-16 ENCOUNTER — Telehealth (HOSPITAL_COMMUNITY): Payer: Self-pay | Admitting: *Deleted

## 2020-11-16 ENCOUNTER — Other Ambulatory Visit (HOSPITAL_COMMUNITY): Payer: Self-pay | Admitting: Physician Assistant

## 2020-11-16 DIAGNOSIS — F1994 Other psychoactive substance use, unspecified with psychoactive substance-induced mood disorder: Secondary | ICD-10-CM

## 2020-11-16 NOTE — Telephone Encounter (Deleted)
Pt called stated  

## 2020-11-17 NOTE — Telephone Encounter (Deleted)
Pt stated [facility] does not have the regular QUEtiapine 150 MG TABS but ony have the XR. Do you want pt to have the XR or if not to re-sent medication to another pharmacy--[facility] 

## 2020-11-17 NOTE — Telephone Encounter (Signed)
Pt stated [facility] pharmacy does not have the regular QUEtiapine 150 MG TABS but ony have the XR. Do you want pt to have the XR or if not to re-sent medication to another pharmacy--[facility]

## 2020-11-18 ENCOUNTER — Other Ambulatory Visit (HOSPITAL_COMMUNITY): Payer: Self-pay | Admitting: Psychiatry

## 2020-11-18 ENCOUNTER — Telehealth (HOSPITAL_COMMUNITY): Payer: Self-pay | Admitting: *Deleted

## 2020-11-18 MED ORDER — QUETIAPINE FUMARATE ER 150 MG PO TB24: 150.0000 mg | ORAL_TABLET | Freq: Every day | ORAL | 3 refills | Status: DC

## 2020-11-18 MED ORDER — QUETIAPINE FUMARATE 150 MG PO TABS: 150.0000 mg | ORAL_TABLET | Freq: Every day | ORAL | 2 refills | Status: DC

## 2020-11-18 NOTE — Telephone Encounter (Signed)
Seroquel was refilled today by Link Snuffer and sent to [facility].

## 2020-11-18 NOTE — Telephone Encounter (Signed)
Provider contacted by Irene Pap, CMA regarding patient's medication availability. Patient's medication to be sent to different pharmacy.

## 2020-11-18 NOTE — Telephone Encounter (Signed)
Called for a second time to check on her seroquel. Link Snuffer has not responded to my message or the one from [facility]. Will ask the other provider that is here today if she would call it in as Seroquel ER 150 mg, rather than Seroquel 150 mg.

## 2020-11-18 NOTE — Telephone Encounter (Signed)
Pharmacy contacted writer, want her Quetiapine changed as ER 150 mg is not available. Will reach out to Eddie to see if he would like to change dose or medicine.

## 2020-11-19 ENCOUNTER — Other Ambulatory Visit (HOSPITAL_COMMUNITY): Payer: Self-pay | Admitting: *Deleted

## 2020-11-19 MED ORDER — QUETIAPINE FUMARATE ER 150 MG PO TB24: 150.0000 mg | ORAL_TABLET | Freq: Every day | ORAL | 3 refills | Status: DC

## 2020-11-23 DIAGNOSIS — I10 Essential (primary) hypertension: Secondary | ICD-10-CM

## 2020-11-24 ENCOUNTER — Other Ambulatory Visit (HOSPITAL_COMMUNITY): Payer: Self-pay | Admitting: Physician Assistant

## 2020-11-24 DIAGNOSIS — F1994 Other psychoactive substance use, unspecified with psychoactive substance-induced mood disorder: Secondary | ICD-10-CM

## 2020-11-24 MED ORDER — QUETIAPINE FUMARATE 150 MG PO TABS: 150.0000 mg | ORAL_TABLET | Freq: Every day | ORAL | 0 refills | Status: DC

## 2020-11-24 NOTE — Telephone Encounter (Signed)
Provider was contact by Orpah Clinton. Reola Calkins, RN regarding medication refill. Seroquel (Quetiapine) ER 150 mg not available. Quetiapine 150 mg at bedtime  to be prescribed in it's place. Provider to contact patient to determine medication's efficacy.

## 2020-11-24 NOTE — Progress Notes (Signed)
Provider was contact by Orpah Clinton. Reola Calkins, RN regarding medication refill. Seroquel (Quetiapine) ER 150 mg not available. Quetiapine 150 mg at bedtime  to be prescribed in it's place. Provider to contact patient to determine medication's efficacy.  It should be noted that Seroquel ER 150 mg was prescribed through [facility] pharmacy. Provider to contact patient to see if medications have been received.

## 2020-11-30 ENCOUNTER — Ambulatory Visit (INDEPENDENT_AMBULATORY_CARE_PROVIDER_SITE_OTHER): Payer: Medicare Other | Admitting: Podiatry

## 2020-11-30 ENCOUNTER — Ambulatory Visit (INDEPENDENT_AMBULATORY_CARE_PROVIDER_SITE_OTHER): Payer: Medicare Other

## 2020-11-30 ENCOUNTER — Other Ambulatory Visit: Payer: Self-pay

## 2020-11-30 DIAGNOSIS — M21612 Bunion of left foot: Secondary | ICD-10-CM | POA: Diagnosis not present

## 2020-11-30 DIAGNOSIS — M722 Plantar fascial fibromatosis: Secondary | ICD-10-CM

## 2020-11-30 DIAGNOSIS — M2141 Flat foot [pes planus] (acquired), right foot: Secondary | ICD-10-CM

## 2020-11-30 DIAGNOSIS — M21611 Bunion of right foot: Secondary | ICD-10-CM

## 2020-11-30 DIAGNOSIS — M2142 Flat foot [pes planus] (acquired), left foot: Secondary | ICD-10-CM | POA: Diagnosis not present

## 2020-12-01 ENCOUNTER — Ambulatory Visit (HOSPITAL_COMMUNITY)
Admission: AD | Admit: 2020-12-01 | Discharge: 2020-12-01 | Disposition: A | Discharge status: Home / Self Care | Payer: Medicare Other | Attending: Psychiatry | Admitting: Psychiatry

## 2020-12-01 ENCOUNTER — Telehealth (HOSPITAL_COMMUNITY): Payer: Self-pay | Admitting: *Deleted

## 2020-12-01 NOTE — BH Assessment (Signed)
Clinician informed @1617  that a walk in presented to [facility]. The [facility] provider [facility], NP) went to see patient to complete the MSE prior to the TTS assessment.  The [facility] provider returned from seeing the patient and stated that patient left abruptly after being asked "How can we help you today"? Therefore, TTS had no involvement in evaluating patient or had an opportunity to complete a TTS on patient for this visit.

## 2020-12-01 NOTE — Telephone Encounter (Signed)
VM from patient, she states she is taking her medicines but "not feeling that good" and reports having a lot of anxiety and would like a call from her provider. Her number is [phone]. Will forward the message to [place] PA her provider.

## 2020-12-01 NOTE — Progress Notes (Signed)
  Subjective:  Patient ID: Marisa Sherman, female    DOB: Oct 09, 1960,  MRN: [id number]  Chief Complaint  Patient presents with   Bunions    np-foot pain;requesting new orthotics-non req-dr. Chaya Jan refer-last seen 2017    60 y.o. female presents with the above complaint. History confirmed with patient.  She has bunions on both feet.  They are not currently painful.  She has worn wider shoe gears and this is helped quite a bit.  She has orthotics that she previously has worn she is on her feet at work all day.  She would like to have new orthotics made.  Her current pair is from 2006  Objective:  Physical Exam: warm, good capillary refill, bunions present which are not painful and have good range of motion, no trophic changes or ulcerative lesions, normal DP and PT pulses, normal sensory exam, and pes planus deformity..  Radiographs: Multiple views x-ray of both feet: no fracture, dislocation, swelling or degenerative changes noted, hallux valgus deformity, and pes planus is noted Assessment:   1. Bilateral bunions   2. Pes planus of both feet      Plan:  Patient was evaluated and treated and all questions answered.  Reviewed etiology and treatment of bunions including surgical and nonsurgical treatment.  Currently not painful and I recommend she continue nonsurgical treatment for now.  She would like to have new orthotics made.  We reviewed the rationale behind orthotics and supporting her arch.  She was fitted for these today and has had quite a bit of improvement with them before so hopefully these will continue to offer her good relief of pain  No follow-ups on file.

## 2020-12-01 NOTE — H&P (Addendum)
Behavioral Health Medical Screening Exam  Marisa Sherman is a 60 y.o. female seen by this provider for medical screening. Alert & oriented x 4, sitting calmly in exam room. When asked what brought the patient in to be seen today, she reports, "I am having lots of panic attacks yesterday and today" "there are a lot of things going on".   This patient has history of anxiety, substance induced mood disorder, PTSD. Sees provider at [facility] for medication management; last seen on 11/06/20. Reports compliance with her medications.   During the interview, this provider asked the patient how we can help her today, she stood up and said "I'm just going to leave, you said you can't help me". Reiterated that it is common practice to ask this question and it does not mean we can't help.   Patient then left the exam room.   Total Time spent with patient: 15 minutes  Psychiatric Specialty Exam:  Presentation  General Appearance: Appropriate for Environment Eye Contact:Fair Speech:Clear and Coherent Speech Volume:Increased Handedness:No data recorded  Mood and Affect  Mood:Anxious Affect:Congruent  Thought Process  Thought Processes:Coherent; Goal Directed Descriptions of Associations:Intact Orientation:Full (Time, Place and Person) Thought Content:Logical History of Schizophrenia/Schizoaffective disorder:No  Duration of Psychotic Symptoms:No data recorded Hallucinations:Hallucinations: -- (remaining psychiatric evaluation was not completed, due to patient leaving the intervview/exam room) Ideas of Reference:No data recorded Suicidal Thoughts:No data recorded Homicidal Thoughts:No data recorded  Sensorium  Memory:No data recorded Judgment:No data recorded Insight:No data recorded  Executive Functions  Concentration:No data recorded Attention Span:No data recorded Recall:No data recorded Fund of Knowledge:No data recorded Language:No data recorded  Psychomotor Activity  Psychomotor  Activity:No data recorded  Assets  Assets:No data recorded  Sleep  Sleep:No data recorded   Physical Exam: Physical Exam ROS Blood pressure (!) 160/109, pulse 79, temperature 99.9 F (37.7 C), temperature source Oral, resp. rate 16, SpO2 100 %. There is no height or weight on file to calculate BMI.  Musculoskeletal: Strength & Muscle Tone: within normal limits Gait & Station: normal Patient leans: N/A   Recommendations:  Based on my evaluation the patient does not appear to have an emergency medical condition. Evaluation was cut short when patient left the exam room, she left before TTS counselor could perform the full psychiatric evaluation. This patient receives medication management from [facility]   Novella Olive, NP 12/01/2020, 6:09 PM

## 2020-12-02 ENCOUNTER — Other Ambulatory Visit (HOSPITAL_COMMUNITY): Payer: Self-pay | Admitting: Physician Assistant

## 2020-12-02 ENCOUNTER — Telehealth (HOSPITAL_COMMUNITY): Payer: Self-pay | Admitting: *Deleted

## 2020-12-02 DIAGNOSIS — F419 Anxiety disorder, unspecified: Secondary | ICD-10-CM

## 2020-12-02 MED ORDER — HYDROXYZINE PAMOATE 50 MG PO CAPS: 50.0000 mg | ORAL_CAPSULE | Freq: Three times a day (TID) | ORAL | 1 refills | Status: DC | PRN

## 2020-12-02 NOTE — Telephone Encounter (Signed)
Call from patient stating she is stressing out by multiple dramas at the recovery center she is living in and not sure her Hydroxyzine in doing the job. Discussed putting boundries on people to limit the stress and she says much of it comes from people coming to her wanting to talk about other people. Also told her I would speak with Eddie PA re increasing her Hydroxyzine which is on a fairly low dose. He agreed she could increase it to 50 mg q 8 hours and he would call in a new rx. I called her back with the change and she expressed her understanding.

## 2020-12-02 NOTE — Telephone Encounter (Signed)
Provider was contacted by Orpah Clinton. Reola Calkins, RN regarding patient's concern over her anxiety. Provider to adjust patient's hydroxyzine medication from 25 mg every 8 hours. Provider to contact patient about adjustments.

## 2020-12-02 NOTE — Progress Notes (Signed)
Provider was contacted by Suzanne K. Beck, RN regarding patient's concern over her anxiety. Provider to adjust patient's hydroxyzine medication from 25 mg every 8 hours. Provider to contact patient about adjustments.

## 2020-12-14 ENCOUNTER — Institutional Professional Consult (permissible substitution): Payer: Medicare Other | Admitting: Neurology

## 2020-12-21 ENCOUNTER — Encounter: Payer: Self-pay | Admitting: Neurology

## 2020-12-24 ENCOUNTER — Telehealth: Payer: Self-pay | Admitting: Podiatry

## 2020-12-24 NOTE — Telephone Encounter (Signed)
Orthotics in.. lvm for pt to call to schedule an appt to pick them up. °

## 2021-01-04 ENCOUNTER — Ambulatory Visit (INDEPENDENT_AMBULATORY_CARE_PROVIDER_SITE_OTHER): Payer: Medicare Other

## 2021-01-04 ENCOUNTER — Other Ambulatory Visit: Payer: Self-pay

## 2021-01-04 DIAGNOSIS — M21611 Bunion of right foot: Secondary | ICD-10-CM

## 2021-01-04 DIAGNOSIS — M2141 Flat foot [pes planus] (acquired), right foot: Secondary | ICD-10-CM

## 2021-01-04 DIAGNOSIS — M722 Plantar fascial fibromatosis: Secondary | ICD-10-CM

## 2021-01-04 NOTE — Progress Notes (Signed)
SITUATION: Reason for Visit: Fitting and Delivery of Custom Fabricated Foot Orthoses Patient Report: Patient reports comfort and is satisfied with device.  OBJECTIVE DATA: Patient History / Diagnosis:  No change in pathology Provided Device:  Custom functional foot orthoses  GOAL OF ORTHOSIS - Improve gait - Decrease energy expenditure - Improve Balance - Provide Triplanar stability of foot complex - Facilitate motion  ACTIONS PERFORMED Patient was fit with foot orthoses trimmed to shoe last. Patient tolerated fittign procedure. Device was modified as follows to better fit patient: - Toe plate was trimmed to shoe last  Patient was provided with verbal and written instruction and demonstration regarding donning, doffing, wear, care, proper fit, function, purpose, cleaning, and use of the orthosis and in all related precautions and risks and benefits regarding the orthosis.  Patient was also provided with verbal instruction regarding how to report any failures or malfunctions of the orthosis and necessary follow up care. Patient was also instructed to contact our office regarding any change in status that may affect the function of the orthosis.  Patient demonstrated independence with proper donning, doffing, and fit and verbalized understanding of all instructions.  PLAN: Patient is to follow up in one week or as necessary (PRN). All questions were answered and concerns addressed. Plan of care was discussed with and agreed upon by the patient.  

## 2021-01-05 ENCOUNTER — Encounter: Payer: Medicare Other | Admitting: Internal Medicine

## 2021-01-06 ENCOUNTER — Telehealth (INDEPENDENT_AMBULATORY_CARE_PROVIDER_SITE_OTHER): Payer: Medicare Other | Admitting: Physician Assistant

## 2021-01-06 ENCOUNTER — Encounter (HOSPITAL_COMMUNITY): Payer: Self-pay | Admitting: Physician Assistant

## 2021-01-06 DIAGNOSIS — F419 Anxiety disorder, unspecified: Secondary | ICD-10-CM | POA: Diagnosis not present

## 2021-01-06 DIAGNOSIS — G4700 Insomnia, unspecified: Secondary | ICD-10-CM | POA: Diagnosis not present

## 2021-01-06 DIAGNOSIS — F4310 Post-traumatic stress disorder, unspecified: Secondary | ICD-10-CM

## 2021-01-06 DIAGNOSIS — F1994 Other psychoactive substance use, unspecified with psychoactive substance-induced mood disorder: Secondary | ICD-10-CM | POA: Diagnosis not present

## 2021-01-06 MED ORDER — HYDROXYZINE PAMOATE 50 MG PO CAPS: 50.0000 mg | ORAL_CAPSULE | Freq: Three times a day (TID) | ORAL | 1 refills | Status: DC | PRN

## 2021-01-06 MED ORDER — FLUOXETINE HCL 40 MG PO CAPS: 40.0000 mg | ORAL_CAPSULE | Freq: Every morning | ORAL | 2 refills | Status: DC

## 2021-01-06 MED ORDER — TRAZODONE HCL 100 MG PO TABS: ORAL_TABLET | ORAL | 2 refills | Status: DC

## 2021-01-06 MED ORDER — QUETIAPINE FUMARATE ER 150 MG PO TB24: 150.0000 mg | ORAL_TABLET | Freq: Every day | ORAL | 3 refills | Status: DC

## 2021-01-06 NOTE — Progress Notes (Addendum)
BH MD/PA/NP OP Progress Note  Virtual Visit via Telephone Note  I connected with Marisa Sherman on 01/06/21 at  5:00 PM EST by telephone and verified that I am speaking with the correct person using two identifiers.  Location: Patient: Home Provider: Clinic   I discussed the limitations, risks, security and privacy concerns of performing an evaluation and management service by telephone and the availability of in person appointments. I also discussed with the patient that there may be a patient responsible charge related to this service. The patient expressed understanding and agreed to proceed.  Follow Up Instructions:  I discussed the assessment and treatment plan with the patient. The patient was provided an opportunity to ask questions and all were answered. The patient agreed with the plan and demonstrated an understanding of the instructions.   The patient was advised to call back or seek an in-person evaluation if the symptoms worsen or if the condition fails to improve as anticipated.  I provided 11 minutes of non-face-to-face time during this encounter.  Meta Hatchet, PA    01/06/2021 5:08 PM Shawndell Varas  MRN:  [id number]  Chief Complaint: Follow up and medication management  HPI:   Marisa Sherman is a 60 year old female with a past psychiatric history significant for Marisa Sherman, substance-induced mood disorder, anxiety, and PTSD who presents to [facility] clinic via virtual telephone visit for follow-up and medication management.  Patient is currently being managed on the following medications:  Trazodone 200 mg at bedtime Bupropion (Wellbutrin SR) 150 mg 12-hour tablet 2 times daily Fluoxetine 40 mg daily Hydroxyzine 25 mg every 8 hours Seroquel 150 mg at bedtime  Patient reports that she has not been taking her Wellbutrin after experiencing adverse side effects.  Patient reports that she experienced GI issues characterized by excessive  diarrhea.  Patient reports that she is still continuing to take her Prozac and has not experienced any issues.  Patient denies experiencing any depressive episodes.  Patient states that she experiences virtually no anxiety when taking her hydroxyzine every 8 hours.  Patient denies any new stressors at this time.  Patient is alert and oriented x4, pleasant, calm, cooperative, and fully engaged in conversation during the encounter.  Patient endorses good mood.  Patient denies suicidal or homicidal ideations.  She further denies auditory or visual hallucinations and does not appear to be responding to internal/external stimuli.  Patient endorses sleep disturbances stating that she only receives roughly 4 hours of sleep each night.  Patient states that she is due for a sleep study to determine the reason for her sleep disturbances.  Patient endorses good appetite and eats on average 3 meals per day.  Patient denies alcohol consumption and illicit drug use.  Patient reports that she has been free from drug use for 9 months.  Patient endorses tobacco use and smokes on average a pack per day.   Visit Diagnosis:    ICD-10-CM   1. Insomnia, unspecified type  G47.00       Past Psychiatric History:  Bipolar d/o, substance induced mood disorder, PTSD, anxiety. Was bring seen at Psychiatry clinic [facility] in [place] for past 4-5 years  Past Medical History:  Past Medical History:  Diagnosis Date   Abnormal Pap smear of cervix    Allergy    Anxiety    Bipolar disorder (HCC)    Cataract    Degenerative disc disease at L5-S1 level    Depression    GERD (gastroesophageal reflux disease)  Gout    Hyperlipidemia    Hypertension    Overactive bladder     Past Surgical History:  Procedure Laterality Date   CARPAL TUNNEL RELEASE Bilateral    CERVIX LESION DESTRUCTION  1985   CHOLECYSTECTOMY     DILATION AND CURETTAGE OF UTERUS     TONSILLECTOMY     TUBAL LIGATION      Family Psychiatric History:   History of depression, anxiety in mother.  She also informed history of anxiety and depression in her siblings.  Family History:  Family History  Problem Relation Age of Onset   Depression Mother    Anxiety disorder Mother    Asthma Mother    Bipolar disorder Father    Depression Sister    Anxiety disorder Sister    Depression Brother    Anxiety disorder Brother    Depression Brother    Anxiety disorder Brother    Cancer Neg Hx    Diabetes Neg Hx    Heart disease Neg Hx    Colon cancer Neg Hx    Breast cancer Neg Hx    Ovarian cancer Neg Hx    Cervical cancer Neg Hx     Social History:  Social History   Socioeconomic History   Marital status: Divorced    Spouse name: Not on file   Number of children: 3   Years of education: Not on file   Highest education level: GED or equivalent  Occupational History   Occupation: Disablilty  Tobacco Use   Smoking status: Every Day    Packs/day: 1.00    Years: 40.00    Pack years: 40.00    Types: Cigarettes   Smokeless tobacco: Never   Tobacco comments:    started smoking at age 34; has quit on and off throughout the years. Has decreased cig use to 0.5 PPD  Vaping Use   Vaping Use: Never used  Substance and Sexual Activity   Alcohol use: Not Currently   Drug use: Not Currently    Comment: 90 days recovery   Sexual activity: Not Currently  Other Topics Concern   Not on file  Social History Narrative   Not on file   Social Determinants of Health   Financial Resource Strain: Low Risk    Difficulty of Paying Living Expenses: Not hard at all  Food Insecurity: No Food Insecurity   Worried About Programme researcher, broadcasting/film/video in the Last Year: Never true   Ran Out of Food in the Last Year: Never true  Transportation Needs: No Transportation Needs   Lack of Transportation (Medical): No   Lack of Transportation (Non-Medical): No  Physical Activity: Sufficiently Active   Days of Exercise per Week: 7 days   Minutes of Exercise per  Session: 30 min  Stress: Stress Concern Present   Feeling of Stress : Rather much  Social Connections: Moderately Isolated   Frequency of Communication with Friends and Family: Twice a week   Frequency of Social Gatherings with Friends and Family: Never   Attends Religious Services: 1 to 4 times per year   Active Member of [employer] or Organizations: Yes   Attends Engineer, structural: More than 4 times per year   Marital Status: Divorced    Allergies:  Allergies  Allergen Reactions   Codeine Nausea Only   Diphenhydramine Hcl Other (See Comments)    Pt unsure why   Lithium Itching   Sulfa Antibiotics Nausea And Vomiting   Amoxicillin  N & V    Metabolic Disorder Labs: No results found for: HGBA1C, MPG No results found for: PROLACTIN Lab Results  Component Value Date   CHOL 165 04/13/2020   TRIG 147 04/13/2020   HDL 41 04/13/2020   CHOLHDL 4.0 04/13/2020   LDLCALC 98 04/13/2020   LDLCALC 90 04/01/2019   Lab Results  Component Value Date   TSH 2.610 04/13/2020   TSH 1.720 07/05/2013    Therapeutic Level Labs: Lab Results  Component Value Date   LITHIUM 0.50 (L) 07/10/2013   LITHIUM <0.25 (L) 07/01/2013   No results found for: VALPROATE No components found for:  CBMZ  Current Medications: Current Outpatient Medications  Medication Sig Dispense Refill   acetaminophen (TYLENOL) 500 MG tablet Take 1,000 mg by mouth every 6 (six) hours as needed for mild pain.     allopurinol (ZYLOPRIM) 100 MG tablet Take 1 tablet (100 mg total) by mouth daily. 90 tablet 0   aspirin EC 81 MG tablet Take 81 mg by mouth daily.     atorvastatin (LIPITOR) 40 MG tablet Take 1 tablet (40 mg total) by mouth daily at 6 PM. 90 tablet 0   buPROPion (WELLBUTRIN XL) 300 MG 24 hr tablet Take 1 tablet (300 mg total) by mouth daily. 90 tablet 1   FLUoxetine (PROZAC) 40 MG capsule Take 1 capsule (40 mg total) by mouth every morning. 30 capsule 2   hydrOXYzine (VISTARIL) 50 MG capsule Take  1 capsule (50 mg total) by mouth every 8 (eight) hours as needed for anxiety. 90 capsule 1   lisinopril (ZESTRIL) 40 MG tablet Take 1 tablet (40 mg total) by mouth daily. 90 tablet 1   pantoprazole (PROTONIX) 40 MG tablet Take 1 tablet (40 mg total) by mouth daily. 90 tablet 3   QUEtiapine Fumarate (SEROQUEL XR) 150 MG 24 hr tablet Take 1 tablet (150 mg total) by mouth at bedtime. 30 tablet 3   sucralfate (CARAFATE) 1 g tablet Take 1 g by mouth 3 (three) times daily.     traZODone (DESYREL) 100 MG tablet TAKE 2 TABLETS BY MOUTH AT BEDTIME AS NEEDED SLEEP 30 tablet 2   No current facility-administered medications for this visit.     Musculoskeletal: Strength & Muscle Tone: Unable to assess due to telemedicine visit Gait & Station: Unable to assess due to telemedicine visit Patient leans: Unable to assess due to telemedicine visit  Psychiatric Specialty Exam: Review of Systems  Psychiatric/Behavioral:  Positive for sleep disturbance. Negative for decreased concentration, dysphoric mood, hallucinations, self-injury and suicidal ideas. The patient is not nervous/anxious and is not hyperactive.    There were no vitals taken for this visit.There is no height or weight on file to calculate BMI.  General Appearance: Unable to assess due to telemedicine visit  Eye Contact:  Unable to assess due to telemedicine visit  Speech:  Clear and Coherent and Normal Rate  Volume:  Normal  Mood:  Euthymic  Affect:  Appropriate  Thought Process:  Coherent, Goal Directed, and Descriptions of Associations: Intact  Orientation:  Full (Time, Place, and Person)  Thought Content: WDL   Suicidal Thoughts:  No  Homicidal Thoughts:  No  Memory:  Immediate;   Good Recent;   Good Remote;   Good  Judgement:  Good  Insight:  Good  Psychomotor Activity:  Normal  Concentration:  Concentration: Good and Attention Span: Good  Recall:  Good  Fund of Knowledge: Good  Language: Good  Akathisia:  NA  Handed:  Right   AIMS (if indicated): not done  Assets:  Communication Skills Desire for Improvement Housing Social Support Vocational/Educational  ADL's:  Intact  Cognition: WNL  Sleep:  Fair   Screenings: AUDIT    Flowsheet Row [facility]  Alcohol Use Disorder Identification Test Final Score (AUDIT) 0      GAD-7    Flowsheet Row Video Visit from 11/06/2020 in [facility] Office Visit from 07/30/2019 in [facility]  Total GAD-7 Score 13 19      PHQ2-9    Flowsheet Row Video Visit from 11/06/2020 in [facility] Office Visit from 09/30/2020 in [facility] from 08/25/2020 in [facility] Office Visit from 07/20/2020 in [facility] Counselor from 07/08/2020 in [facility]  PHQ-2 Total Score 2 0 0 1 2  PHQ-9 Total Score 10 5 -- 12 12      Flowsheet Row Video Visit from 11/06/2020 in [facility] ED from 11/03/2020 in [facility] Office Visit from 07/20/2020 in [facility]  C-SSRS RISK CATEGORY No Risk No Risk Error: Q7 should not be populated when Q6 is No        Assessment and Plan:   Kori Goins is a 60 year old female with a past psychiatric history significant for Marisa Sherman, substance-induced mood disorder, anxiety, and PTSD who presents to [facility] Outpatient clinic via virtual telephone visit for follow-up and medication management.  Patient reports virtually no issues with her medications except for Wellbutrin which she explains that she appearances diarrhea when taking the medication.  Patient has discontinued herself off the medication.  Patient would like to continue taking the rest of her medications as prescribed  denies experiencing depressive episodes or anxiety.  Patient's medications to be e-prescribed to pharmacy of choice.  1. Insomnia, unspecified type  - traZODone (DESYREL) 100 MG tablet; TAKE 2 TABLETS BY MOUTH AT BEDTIME AS NEEDED SLEEP  Dispense: 30 tablet; Refill: 2  2. Substance induced mood disorder (HCC)  - QUEtiapine Fumarate (SEROQUEL XR) 150 MG 24 hr tablet; Take 1 tablet (150 mg total) by mouth at bedtime.  Dispense: 30 tablet; Refill: 3 - FLUoxetine (PROZAC) 40 MG capsule; Take 1 capsule (40 mg total) by mouth every morning.  Dispense: 30 capsule; Refill: 2  3. PTSD (post-traumatic stress disorder)  - FLUoxetine (PROZAC) 40 MG capsule; Take 1 capsule (40 mg total) by mouth every morning.  Dispense: 30 capsule; Refill: 2  4. Anxiety  - QUEtiapine Fumarate (SEROQUEL XR) 150 MG 24 hr tablet; Take 1 tablet (150 mg total) by mouth at bedtime.  Dispense: 30 tablet; Refill: 3 - hydrOXYzine (VISTARIL) 50 MG capsule; Take 1 capsule (50 mg total) by mouth every 8 (eight) hours as needed for anxiety.  Dispense: 90 capsule; Refill: 1  Patient to follow up in 2 months Provider spent a total of 11 minutes with the patient/reviewing patient's chart  Meta Hatchet, PA 01/06/2021, 5:08 PM

## 2021-01-08 ENCOUNTER — Other Ambulatory Visit: Payer: Self-pay | Admitting: *Deleted

## 2021-01-08 DIAGNOSIS — I10 Essential (primary) hypertension: Secondary | ICD-10-CM

## 2021-01-08 MED ORDER — ATORVASTATIN CALCIUM 40 MG PO TABS: 40.0000 mg | ORAL_TABLET | Freq: Every day | ORAL | 1 refills | Status: DC

## 2021-01-08 MED ORDER — ALLOPURINOL 100 MG PO TABS: 100.0000 mg | ORAL_TABLET | Freq: Every day | ORAL | 0 refills | Status: DC

## 2021-01-08 MED ORDER — LISINOPRIL 40 MG PO TABS: 40.0000 mg | ORAL_TABLET | Freq: Every day | ORAL | 0 refills | Status: DC

## 2021-01-08 NOTE — Telephone Encounter (Signed)
Refill sent.

## 2021-01-26 ENCOUNTER — Encounter: Payer: Self-pay | Admitting: Internal Medicine

## 2021-02-01 ENCOUNTER — Ambulatory Visit (INDEPENDENT_AMBULATORY_CARE_PROVIDER_SITE_OTHER): Payer: Commercial Managed Care - HMO

## 2021-02-01 ENCOUNTER — Other Ambulatory Visit: Payer: Self-pay

## 2021-02-01 ENCOUNTER — Encounter: Payer: Self-pay | Admitting: Internal Medicine

## 2021-02-01 ENCOUNTER — Ambulatory Visit (INDEPENDENT_AMBULATORY_CARE_PROVIDER_SITE_OTHER): Payer: Commercial Managed Care - HMO | Admitting: Internal Medicine

## 2021-02-01 VITALS — BP 102/70 | HR 81 | Temp 98.1°F | Ht 66.0 in | Wt 191.6 lb

## 2021-02-01 DIAGNOSIS — M542 Cervicalgia: Secondary | ICD-10-CM

## 2021-02-01 DIAGNOSIS — M79601 Pain in right arm: Secondary | ICD-10-CM | POA: Diagnosis not present

## 2021-02-01 DIAGNOSIS — M4312 Spondylolisthesis, cervical region: Secondary | ICD-10-CM | POA: Diagnosis not present

## 2021-02-01 DIAGNOSIS — M47812 Spondylosis without myelopathy or radiculopathy, cervical region: Secondary | ICD-10-CM | POA: Diagnosis not present

## 2021-02-01 DIAGNOSIS — M79602 Pain in left arm: Secondary | ICD-10-CM

## 2021-02-01 DIAGNOSIS — M4802 Spinal stenosis, cervical region: Secondary | ICD-10-CM | POA: Diagnosis not present

## 2021-02-01 NOTE — Progress Notes (Signed)
Established Patient Office Visit     This visit occurred during the SARS-CoV-2 public health emergency.  Safety protocols were in place, including screening questions prior to the visit, additional usage of staff PPE, and extensive cleaning of exam room while observing appropriate contact time as indicated for disinfecting solutions.    CC/Reason for Visit: Neck and bilateral arm pain  HPI: Marisa Sherman is a 61 y.o. female who is coming in today for the above mentioned reasons. Past Medical History is significant for: Hypertension, hyperlipidemia, gout, peripheral neuropathy, ongoing tobacco abuse.  She has had right shoulder pain for years.  Has never visited orthopedics for this.  Over the course of the last few months she is now having neck pain with what she describes as achiness of bilateral upper arms.  She will sometimes have a tingling sensation that travels down her arm.  These issues make dressing difficult, she does not have limitation to range of movement but it hurts to perform these movements.   Past Medical/Surgical History: Past Medical History:  Diagnosis Date   Abnormal Pap smear of cervix    Allergy    Anxiety    Bipolar disorder (HCC)    Cataract    Degenerative disc disease at L5-S1 level    Depression    GERD (gastroesophageal reflux disease)    Gout    Hyperlipidemia    Hypertension    Overactive bladder     Past Surgical History:  Procedure Laterality Date   CARPAL TUNNEL RELEASE Bilateral    CERVIX LESION DESTRUCTION  1985   CHOLECYSTECTOMY     DILATION AND CURETTAGE OF UTERUS     TONSILLECTOMY     TUBAL LIGATION      Social History:  reports that she has been smoking cigarettes. She has a 40.00 pack-year smoking history. She has never used smokeless tobacco. She reports that she does not currently use alcohol. She reports that she does not currently use drugs.  Allergies: Allergies  Allergen Reactions   Codeine Nausea Only    Diphenhydramine Hcl Other (See Comments)    Pt unsure why   Lithium Itching   Sulfa Antibiotics Nausea And Vomiting   Amoxicillin     N & V    Family History:  Family History  Problem Relation Age of Onset   Depression Mother    Anxiety disorder Mother    Asthma Mother    Bipolar disorder Father    Depression Sister    Anxiety disorder Sister    Depression Brother    Anxiety disorder Brother    Depression Brother    Anxiety disorder Brother    Cancer Neg Hx    Diabetes Neg Hx    Heart disease Neg Hx    Colon cancer Neg Hx    Breast cancer Neg Hx    Ovarian cancer Neg Hx    Cervical cancer Neg Hx      Current Outpatient Medications:    acetaminophen (TYLENOL) 500 MG tablet, Take 1,000 mg by mouth every 6 (six) hours as needed for mild pain., Disp: , Rfl:    allopurinol (ZYLOPRIM) 100 MG tablet, Take 1 tablet (100 mg total) by mouth daily., Disp: 90 tablet, Rfl: 0   aspirin EC 81 MG tablet, Take 81 mg by mouth daily., Disp: , Rfl:    atorvastatin (LIPITOR) 40 MG tablet, Take 1 tablet (40 mg total) by mouth daily at 6 PM., Disp: 90 tablet, Rfl: 1  FLUoxetine (PROZAC) 40 MG capsule, Take 1 capsule (40 mg total) by mouth every morning., Disp: 30 capsule, Rfl: 2   hydrOXYzine (VISTARIL) 50 MG capsule, Take 1 capsule (50 mg total) by mouth every 8 (eight) hours as needed for anxiety., Disp: 90 capsule, Rfl: 1   lisinopril (ZESTRIL) 40 MG tablet, Take 1 tablet (40 mg total) by mouth daily., Disp: 90 tablet, Rfl: 0   pantoprazole (PROTONIX) 40 MG tablet, Take 1 tablet (40 mg total) by mouth daily., Disp: 90 tablet, Rfl: 3   QUEtiapine Fumarate (SEROQUEL XR) 150 MG 24 hr tablet, Take 1 tablet (150 mg total) by mouth at bedtime., Disp: 30 tablet, Rfl: 3   sucralfate (CARAFATE) 1 g tablet, Take 1 g by mouth 3 (three) times daily., Disp: , Rfl:    traZODone (DESYREL) 100 MG tablet, TAKE 2 TABLETS BY MOUTH AT BEDTIME AS NEEDED SLEEP, Disp: 30 tablet, Rfl: 2  Review of Systems:   Constitutional: Denies fever, chills, diaphoresis, appetite change and fatigue.  HEENT: Denies photophobia, eye pain, redness, hearing loss, ear pain, congestion, sore throat, rhinorrhea, sneezing, mouth sores, trouble swallowing, neck pain, neck stiffness and tinnitus.   Respiratory: Denies SOB, DOE, cough, chest tightness,  and wheezing.   Cardiovascular: Denies chest pain, palpitations and leg swelling.  Gastrointestinal: Denies nausea, vomiting, abdominal pain, diarrhea, constipation, blood in stool and abdominal distention.  Genitourinary: Denies dysuria, urgency, frequency, hematuria, flank pain and difficulty urinating.  Endocrine: Denies: hot or cold intolerance, sweats, changes in hair or nails, polyuria, polydipsia. Musculoskeletal: Denies  back pain, joint swelling  and gait problem.  Skin: Denies pallor, rash and wound.  Neurological: Denies dizziness, seizures, syncope, weakness, light-headedness, numbness and headaches.  Hematological: Denies adenopathy. Easy bruising, personal or family bleeding history  Psychiatric/Behavioral: Denies suicidal ideation, mood changes, confusion, nervousness, sleep disturbance and agitation    Physical Exam: Vitals:   02/01/21 1511  BP: 102/70  Pulse: 81  Temp: 98.1 F (36.7 C)  TempSrc: Oral  SpO2: 99%  Weight: 191 lb 9.6 oz (86.9 kg)  Height: 5\' 6"  (1.676 m)    Body mass index is 30.93 kg/m.   Constitutional: NAD, calm, comfortable Eyes: PERRL, lids and conjunctivae normal ENMT: Mucous membranes are moist. Respiratory: clear to auscultation bilaterally, no wheezing, no crackles. Normal respiratory effort. No accessory muscle use.  Cardiovascular: Regular rate and rhythm, no murmurs / rubs / gallops. No extremity edema.   Musculoskeletal: She has full range of motion of bilateral shoulders including frontal and lateral raises.  No symptoms suggestive of shoulder impingement. Psychiatric: Normal judgment and insight. Alert and  oriented x 3. Normal mood.    Impression and Plan:  Neck pain  Bilateral arm pain  - Plan: DG Cervical Spine Complete -By description this sounds more like a cervical radiculopathy issue. -Will start with a C-spine x-ray, may need MRI pending results.  Time spent: 21 minutes reviewing chart, interviewing and examining patient, formulating plan of care.    Lelon Frohlich, MD [facility]

## 2021-02-02 ENCOUNTER — Other Ambulatory Visit: Payer: Self-pay | Admitting: Internal Medicine

## 2021-02-02 DIAGNOSIS — M542 Cervicalgia: Secondary | ICD-10-CM

## 2021-02-03 ENCOUNTER — Telehealth: Payer: Self-pay | Admitting: Internal Medicine

## 2021-02-03 NOTE — Telephone Encounter (Signed)
Patient called because she is having aching and stiffness and she believes it may be fibromyalgia. She has a hard time sitting down and a hard time getting up. She would like to be tested for fibromyalgia.      Good callback number is [phone]     Please advise

## 2021-02-03 NOTE — Telephone Encounter (Signed)
Pt had OV for neck, elbow, knee pain & generalized body aches on 02/01/21. Xray was ordered. PCP result note given discussed with pt; pt aware that MRI is ordered & she will be called to schedule. Pt advised to await results of MRI for next steps regarding pain. Pt verb understanding.

## 2021-02-05 ENCOUNTER — Telehealth: Payer: Self-pay | Admitting: Internal Medicine

## 2021-02-05 NOTE — Telephone Encounter (Signed)
Pt saw dr Ardyth Harps on 02-01-2021 for neck pain and is still waiting for MRI and would like something for pain  [facility] [address], [place] - [address] Phone:  [phone]  Fax:  [phone]

## 2021-02-08 NOTE — Telephone Encounter (Signed)
Spoke to pt and advised that MRI is ready for scheduling. Gave pt referral information to go ahead and schedule appt. Pt advised that I will send message to Dr.Hernandez to see if a medication can be sent in for pt if appropriate.

## 2021-02-09 ENCOUNTER — Other Ambulatory Visit: Payer: Self-pay | Admitting: Internal Medicine

## 2021-02-09 ENCOUNTER — Telehealth: Payer: Self-pay | Admitting: Internal Medicine

## 2021-02-09 DIAGNOSIS — F40240 Claustrophobia: Secondary | ICD-10-CM

## 2021-02-09 MED ORDER — LORAZEPAM 1 MG PO TABS: 2.0000 mg | ORAL_TABLET | Freq: Once | ORAL | 0 refills | Status: AC

## 2021-02-09 NOTE — Telephone Encounter (Signed)
Patient called because she has scheduled a MRI for next week but she is claustrophobic. Patient wants to know if she could have something called in for that and if she could have some pain medication as well to hold her over until her MRI next week.    Please send to   [place], [place] - [place] Phone:  [phone]  Fax:  [phone]         Please advise

## 2021-02-09 NOTE — Telephone Encounter (Signed)
Please advise 

## 2021-02-10 ENCOUNTER — Other Ambulatory Visit: Payer: Self-pay

## 2021-02-10 ENCOUNTER — Ambulatory Visit: Payer: Medicare Other

## 2021-02-10 DIAGNOSIS — M722 Plantar fascial fibromatosis: Secondary | ICD-10-CM

## 2021-02-10 DIAGNOSIS — M21612 Bunion of left foot: Secondary | ICD-10-CM

## 2021-02-10 DIAGNOSIS — M2141 Flat foot [pes planus] (acquired), right foot: Secondary | ICD-10-CM

## 2021-02-10 NOTE — Progress Notes (Signed)
SITUATION Reason for Consult: Follow-up with foot orthotics Patient / Caregiver Report: Patient would like her inserts refurbished  OBJECTIVE DATA History / Diagnosis:    ICD-10-CM   1. Pes planus of both feet  M21.41    M21.42     2. Bilateral plantar fasciitis  M72.2     3. Bilateral bunions  M21.611    M21.612       Change in Pathology: None  ACTIONS PERFORMED Patient's equipment was checked for structural stability and fit. Orthotics taken for refurbishment of topcoat. All questions answered and concerns addressed.  PLAN Patient to be called when ready. Plan of care discussed with and agreed upon by patient / caregiver.

## 2021-02-10 NOTE — Telephone Encounter (Signed)
Tried calling pt 2x however goig to vm. LM for pt to return call.

## 2021-02-10 NOTE — Telephone Encounter (Signed)
Patient notified of update  and verbalized understanding. 

## 2021-02-16 ENCOUNTER — Other Ambulatory Visit: Payer: Self-pay

## 2021-02-16 ENCOUNTER — Ambulatory Visit
Admission: RE | Admit: 2021-02-16 | Discharge: 2021-02-16 | Disposition: A | Discharge status: Home / Self Care | Payer: Commercial Managed Care - HMO | Source: Ambulatory Visit | Attending: Internal Medicine | Admitting: Internal Medicine

## 2021-02-16 DIAGNOSIS — M542 Cervicalgia: Secondary | ICD-10-CM

## 2021-02-16 DIAGNOSIS — M2578 Osteophyte, vertebrae: Secondary | ICD-10-CM | POA: Diagnosis not present

## 2021-02-16 DIAGNOSIS — M4802 Spinal stenosis, cervical region: Secondary | ICD-10-CM | POA: Diagnosis not present

## 2021-02-17 ENCOUNTER — Other Ambulatory Visit (HOSPITAL_COMMUNITY): Payer: Self-pay | Admitting: Physician Assistant

## 2021-02-17 DIAGNOSIS — F1994 Other psychoactive substance use, unspecified with psychoactive substance-induced mood disorder: Secondary | ICD-10-CM

## 2021-02-17 DIAGNOSIS — F419 Anxiety disorder, unspecified: Secondary | ICD-10-CM

## 2021-02-18 ENCOUNTER — Other Ambulatory Visit: Payer: Self-pay

## 2021-02-18 DIAGNOSIS — M4802 Spinal stenosis, cervical region: Secondary | ICD-10-CM

## 2021-02-19 ENCOUNTER — Telehealth: Payer: Self-pay | Admitting: Internal Medicine

## 2021-02-19 NOTE — Telephone Encounter (Signed)
Stephanie with [facility] is trying to reach patient.The numbers that she have on file for patient is out of service. Colletta Maryland is requesting for someone to reach out to patient to get her new number. Colletta Maryland is asking the patient to call [facility] with the updated information.  [facility] contact number is [phone].  Please advise.

## 2021-02-22 ENCOUNTER — Telehealth: Payer: Self-pay | Admitting: Internal Medicine

## 2021-02-22 NOTE — Telephone Encounter (Signed)
Result note given to pt: These is severe spinal canal stenosis at C5-C6 with severe left and moderate right foraminal narrowing. There are also some other findings. Let's place referral to neurosurgery. Pt advised the [facility] is trying to reach her (see previous phone encounter from 02/19/21); given number to call them. Will cancel appt with PCP for MRI results. Pt verb understanding.

## 2021-02-22 NOTE — Telephone Encounter (Signed)
Pt has made an appt with dr Jerilee Hoh for 02-23-2021 2 pm

## 2021-02-22 NOTE — Telephone Encounter (Signed)
Called pt multiple times, no answer. Left message to call back to reschedule PV by 5pm or procedure would be cancelled. No return call received.  Procedure cancelled

## 2021-02-22 NOTE — Telephone Encounter (Signed)
Patient states she has a new phone number to be called about MRI results.  New number has been updated in patient's chart.

## 2021-02-22 NOTE — Telephone Encounter (Signed)
Left a message for the pt to return my call.  

## 2021-02-23 ENCOUNTER — Telehealth: Payer: Self-pay | Admitting: Podiatry

## 2021-02-23 ENCOUNTER — Ambulatory Visit: Payer: Medicare Other | Admitting: Internal Medicine

## 2021-02-23 NOTE — Telephone Encounter (Signed)
Refurbished orthotics in... lvm for pt ok to stop in and pick up.

## 2021-02-24 ENCOUNTER — Other Ambulatory Visit: Payer: Self-pay | Admitting: Neurosurgery

## 2021-02-24 DIAGNOSIS — M4802 Spinal stenosis, cervical region: Secondary | ICD-10-CM | POA: Diagnosis not present

## 2021-02-24 DIAGNOSIS — G992 Myelopathy in diseases classified elsewhere: Secondary | ICD-10-CM | POA: Diagnosis not present

## 2021-03-01 ENCOUNTER — Telehealth: Payer: Self-pay | Admitting: *Deleted

## 2021-03-01 NOTE — Telephone Encounter (Signed)
Message left for pt.with calk back number, to return  call and reschedule pre-visit by 5 pm or procedure will be cancelled for 03/08/21.

## 2021-03-01 NOTE — Telephone Encounter (Signed)
No return call received no show letter sent procedure cancelled for 03/08/21

## 2021-03-02 DIAGNOSIS — M4802 Spinal stenosis, cervical region: Secondary | ICD-10-CM | POA: Diagnosis not present

## 2021-03-02 NOTE — Pre-Procedure Instructions (Signed)
Surgical Instructions    Your procedure is scheduled on Friday, February 10th.  Report to [facility] Main Entrance "A" at 8:15 A.M., then check in with the Admitting office.  Call this number if you have problems the morning of surgery:  [phone]   If you have any questions prior to your surgery date call [phone]: Open Monday-Friday 8am-4pm    Remember:  Do not eat or drink after midnight the night before your surgery   Take these medicines as needed the morning of surgery with A SIP OF WATER:  acetaminophen (TYLENOL)  hydrOXYzine (VISTARIL)  Follow your surgeon's instructions on when to stop Aspirin.  If no instructions were given by your surgeon then you will need to call the office to get those instructions.    As of today, STOP taking any Aspirin (unless otherwise instructed by your surgeon) Aleve, Naproxen, Ibuprofen, Motrin, Advil, Goody's, BC's, all herbal medications, fish oil, and all vitamins.           Do not wear jewelry or makeup Do not wear lotions, powders, perfumes, or deodorant. Do not shave 48 hours prior to surgery.   Do not bring valuables to the hospital. Do not wear nail polish, gel polish, artificial nails, or any other type of covering on natural nails (fingers and toes) If you have artificial nails or gel coating that need to be removed by a nail salon, please have this removed prior to surgery. Artificial nails or gel coating may interfere with anesthesia's ability to adequately monitor your vital signs.  [facility] is not responsible for any belongings or valuables. .   Do NOT Smoke (Tobacco/Vaping)  24 hours prior to your procedure  If you use a CPAP at night, you may bring your mask for your overnight stay.   Contacts, glasses, hearing aids, dentures or partials may not be worn into surgery, please bring cases for these belongings   For patients admitted to the hospital, discharge time will be determined by your treatment team.   Patients  discharged the day of surgery will not be allowed to drive home, and someone needs to stay with them for 24 hours.  NO VISITORS WILL BE ALLOWED IN PRE-OP WHERE PATIENTS ARE PREPPED FOR SURGERY.  ONLY 1 SUPPORT PERSON MAY BE PRESENT IN THE WAITING ROOM WHILE YOU ARE IN SURGERY.  IF YOU ARE TO BE ADMITTED, ONCE YOU ARE IN YOUR ROOM YOU WILL BE ALLOWED TWO (2) VISITORS. 1 (ONE) VISITOR MAY STAY OVERNIGHT BUT MUST ARRIVE TO THE ROOM BY 8pm.  Minor children may have two parents present. Special consideration for safety and communication needs will be reviewed on a case by case basis.  Special instructions:    Oral Hygiene is also important to reduce your risk of infection.  Remember - BRUSH YOUR TEETH THE MORNING OF SURGERY WITH YOUR REGULAR TOOTHPASTE   [facility]- Preparing For Surgery  Before surgery, you can play an important role. Because skin is not sterile, your skin needs to be as free of germs as possible. You can reduce the number of germs on your skin by washing with CHG (chlorahexidine gluconate) Soap before surgery.  CHG is an antiseptic cleaner which kills germs and bonds with the skin to continue killing germs even after washing.     Please do not use if you have an allergy to CHG or antibacterial soaps. If your skin becomes reddened/irritated stop using the CHG.  Do not shave (including legs and underarms) for at  least 48 hours prior to first CHG shower. It is OK to shave your face.  Please follow these instructions carefully.     Shower the NIGHT BEFORE SURGERY and the MORNING OF SURGERY with CHG Soap.   If you chose to wash your hair, wash your hair first as usual with your normal shampoo. After you shampoo, rinse your hair and body thoroughly to remove the shampoo.  Then [employer] and genitals (private parts) with your normal soap and rinse thoroughly to remove soap.  After that Use CHG Soap as you would any other liquid soap. You can apply CHG directly to the skin and wash  gently with a scrungie or a clean washcloth.   Apply the CHG Soap to your body ONLY FROM THE NECK DOWN.  Do not use on open wounds or open sores. Avoid contact with your eyes, ears, mouth and genitals (private parts). Wash Face and genitals (private parts)  with your normal soap.   Wash thoroughly, paying special attention to the area where your surgery will be performed.  Thoroughly rinse your body with warm water from the neck down.  DO NOT shower/wash with your normal soap after using and rinsing off the CHG Soap.  Pat yourself dry with a CLEAN TOWEL.  Wear CLEAN PAJAMAS to bed the night before surgery  Place CLEAN SHEETS on your bed the night before your surgery  DO NOT SLEEP WITH PETS.   Day of Surgery:  Take a shower with CHG soap. Wear Clean/Comfortable clothing the morning of surgery Do not apply any deodorants/lotions.   Remember to brush your teeth WITH YOUR REGULAR TOOTHPASTE.    COVID testing  If you are going to stay overnight or be admitted after your procedure/surgery and require a pre-op COVID test, please follow these instructions after your COVID test   You are not required to quarantine however you are required to wear a well-fitting mask when you are out and around people not in your household.  If your mask becomes wet or soiled, replace with a new one.  Wash your hands often with soap and water for 20 seconds or clean your hands with an alcohol-based hand sanitizer that contains at least 60% alcohol.  Do not share personal items.  Notify your provider: if you are in close contact with someone who has COVID  or if you develop a fever of 100.4 or greater, sneezing, cough, sore throat, shortness of breath or body aches.    Please read over the following fact sheets that you were given.

## 2021-03-03 ENCOUNTER — Other Ambulatory Visit: Payer: Self-pay

## 2021-03-03 ENCOUNTER — Encounter (HOSPITAL_COMMUNITY)
Admission: RE | Admit: 2021-03-03 | Discharge: 2021-03-03 | Disposition: A | Discharge status: Home / Self Care | Payer: Medicare Other | Source: Ambulatory Visit | Attending: Neurosurgery | Admitting: Neurosurgery

## 2021-03-03 ENCOUNTER — Encounter (HOSPITAL_COMMUNITY): Payer: Self-pay

## 2021-03-03 VITALS — BP 154/97 | HR 70 | Temp 98.1°F | Resp 18 | Ht 66.0 in | Wt 189.0 lb

## 2021-03-03 DIAGNOSIS — Z01818 Encounter for other preprocedural examination: Secondary | ICD-10-CM

## 2021-03-03 DIAGNOSIS — Z20822 Contact with and (suspected) exposure to covid-19: Secondary | ICD-10-CM | POA: Diagnosis not present

## 2021-03-03 DIAGNOSIS — I10 Essential (primary) hypertension: Secondary | ICD-10-CM | POA: Diagnosis not present

## 2021-03-03 DIAGNOSIS — Z01812 Encounter for preprocedural laboratory examination: Secondary | ICD-10-CM | POA: Diagnosis not present

## 2021-03-03 DIAGNOSIS — E876 Hypokalemia: Secondary | ICD-10-CM | POA: Insufficient documentation

## 2021-03-03 HISTORY — DX: Nausea with vomiting, unspecified: R11.2

## 2021-03-03 HISTORY — DX: Other complications of anesthesia, initial encounter: T88.59XA

## 2021-03-03 HISTORY — DX: Other specified postprocedural states: Z98.890

## 2021-03-03 LAB — BASIC METABOLIC PANEL
Anion gap: 8 (ref 5–15)
BUN: 6 mg/dL — ABNORMAL LOW (ref 8–23)
CO2: 29 mmol/L (ref 22–32)
Calcium: 8.7 mg/dL — ABNORMAL LOW (ref 8.9–10.3)
Chloride: 102 mmol/L (ref 98–111)
Creatinine, Ser: 0.84 mg/dL (ref 0.44–1.00)
GFR, Estimated: >60 mL/min (ref >60)
Glucose, Bld: 102 mg/dL — ABNORMAL HIGH (ref 70–99)
Potassium: 3.2 mmol/L — ABNORMAL LOW (ref 3.5–5.1)
Sodium: 139 mmol/L (ref 135–145)

## 2021-03-03 LAB — CBC
HCT: 37.6 % (ref 36.0–46.0)
Hemoglobin: 12.5 g/dL (ref 12.0–15.0)
MCH: 28.2 pg (ref 26.0–34.0)
MCHC: 33.2 g/dL (ref 30.0–36.0)
MCV: 84.7 fL (ref 80.0–100.0)
Platelets: 288 10*3/uL (ref 150–400)
RBC: 4.44 MIL/uL (ref 3.87–5.11)
RDW: 12.4 % (ref 11.5–15.5)
WBC: 10.9 10*3/uL — ABNORMAL HIGH (ref 4.0–10.5)
nRBC: 0 % (ref 0.0–0.2)

## 2021-03-03 LAB — TYPE AND SCREEN
ABO/RH(D): O POS
Antibody Screen: NEGATIVE

## 2021-03-03 LAB — SURGICAL PCR SCREEN
MRSA, PCR: NEGATIVE
Staphylococcus aureus: NEGATIVE

## 2021-03-03 NOTE — Progress Notes (Signed)
PCP - Dr. Ted Mcalpine Cardiologist - Denies  Chest x-ray - 11/03/20 EKG - 11/04/20 Stress Test - Denies ECHO - 11/04/20 Cardiac Cath - Denies  Sleep Study - Scheduled for one  DM - Denies  Aspirin Instructions: Instructed to stop last week LD 02/18/21  COVID TEST- 03/03/21   Anesthesia review: No  Patient denies shortness of breath, fever, cough and chest pain at PAT appointment   All instructions explained to the patient, with a verbal understanding of the material. Patient agrees to go over the instructions while at home for a better understanding. Patient also instructed to wear a mask while in public after being tested for COVID-19. The opportunity to ask questions was provided.

## 2021-03-04 LAB — SARS CORONAVIRUS 2 (TAT 6-24 HRS): SARS Coronavirus 2: NEGATIVE

## 2021-03-05 ENCOUNTER — Ambulatory Visit (HOSPITAL_BASED_OUTPATIENT_CLINIC_OR_DEPARTMENT_OTHER): Payer: Medicare Other | Admitting: Certified Registered Nurse Anesthetist

## 2021-03-05 ENCOUNTER — Observation Stay (HOSPITAL_COMMUNITY)
Admission: RE | Admit: 2021-03-05 | Discharge: 2021-03-06 | Disposition: A | Discharge status: Home / Self Care | Payer: Medicare Other | Attending: Neurosurgery | Admitting: Neurosurgery

## 2021-03-05 ENCOUNTER — Ambulatory Visit (HOSPITAL_COMMUNITY): Payer: Medicare Other | Admitting: Certified Registered Nurse Anesthetist

## 2021-03-05 ENCOUNTER — Other Ambulatory Visit: Payer: Self-pay

## 2021-03-05 ENCOUNTER — Ambulatory Visit (HOSPITAL_COMMUNITY): Payer: Medicare Other

## 2021-03-05 ENCOUNTER — Encounter (HOSPITAL_COMMUNITY): Payer: Self-pay | Admitting: Neurosurgery

## 2021-03-05 ENCOUNTER — Encounter (HOSPITAL_COMMUNITY)
Admission: RE | Disposition: A | Discharge status: Home / Self Care | Payer: Self-pay | Source: Home / Self Care | Attending: Neurosurgery

## 2021-03-05 DIAGNOSIS — M4802 Spinal stenosis, cervical region: Secondary | ICD-10-CM | POA: Diagnosis not present

## 2021-03-05 DIAGNOSIS — M4712 Other spondylosis with myelopathy, cervical region: Secondary | ICD-10-CM | POA: Diagnosis not present

## 2021-03-05 DIAGNOSIS — Z7982 Long term (current) use of aspirin: Secondary | ICD-10-CM | POA: Diagnosis not present

## 2021-03-05 DIAGNOSIS — I10 Essential (primary) hypertension: Secondary | ICD-10-CM | POA: Diagnosis not present

## 2021-03-05 DIAGNOSIS — Z419 Encounter for procedure for purposes other than remedying health state, unspecified: Secondary | ICD-10-CM

## 2021-03-05 DIAGNOSIS — M4722 Other spondylosis with radiculopathy, cervical region: Principal | ICD-10-CM | POA: Insufficient documentation

## 2021-03-05 DIAGNOSIS — Z981 Arthrodesis status: Secondary | ICD-10-CM | POA: Diagnosis not present

## 2021-03-05 DIAGNOSIS — M4322 Fusion of spine, cervical region: Secondary | ICD-10-CM | POA: Diagnosis not present

## 2021-03-05 DIAGNOSIS — Z79899 Other long term (current) drug therapy: Secondary | ICD-10-CM | POA: Insufficient documentation

## 2021-03-05 DIAGNOSIS — F17210 Nicotine dependence, cigarettes, uncomplicated: Secondary | ICD-10-CM | POA: Diagnosis not present

## 2021-03-05 HISTORY — PX: ANTERIOR CERVICAL DECOMP/DISCECTOMY FUSION: SHX1161

## 2021-03-05 LAB — ABO/RH: ABO/RH(D): O POS

## 2021-03-05 SURGERY — ANTERIOR CERVICAL DECOMPRESSION/DISCECTOMY FUSION 2 LEVELS
Anesthesia: General

## 2021-03-05 MED ORDER — LIDOCAINE 2% (20 MG/ML) 5 ML SYRINGE
INTRAMUSCULAR | Status: AC
  Filled 2021-03-05: qty 5

## 2021-03-05 MED ORDER — HYDROMORPHONE HCL 1 MG/ML IJ SOLN: 1.0000 mg | INTRAMUSCULAR | Status: DC | PRN

## 2021-03-05 MED ORDER — DEXAMETHASONE SODIUM PHOSPHATE 10 MG/ML IJ SOLN
INTRAMUSCULAR | Status: DC | PRN
Start: 2021-03-05 — End: 2021-03-05
  Administered 2021-03-05: 10 mg via INTRAVENOUS

## 2021-03-05 MED ORDER — PHENYLEPHRINE 40 MCG/ML (10ML) SYRINGE FOR IV PUSH (FOR BLOOD PRESSURE SUPPORT)
PREFILLED_SYRINGE | INTRAVENOUS | Status: AC
  Filled 2021-03-05: qty 10

## 2021-03-05 MED ORDER — ROCURONIUM BROMIDE 10 MG/ML (PF) SYRINGE
PREFILLED_SYRINGE | INTRAVENOUS | Status: AC
  Filled 2021-03-05: qty 10

## 2021-03-05 MED ORDER — LACTATED RINGERS IV SOLN: INTRAVENOUS | Status: DC | PRN

## 2021-03-05 MED ORDER — HEMOSTATIC AGENTS (NO CHARGE) OPTIME
TOPICAL | Status: DC | PRN
  Administered 2021-03-05: 1 via TOPICAL

## 2021-03-05 MED ORDER — THROMBIN 5000 UNITS EX SOLR
CUTANEOUS | Status: DC | PRN
  Administered 2021-03-05 (×2): 5000 [IU] via TOPICAL

## 2021-03-05 MED ORDER — ONDANSETRON HCL 4 MG PO TABS: 4.0000 mg | ORAL_TABLET | Freq: Four times a day (QID) | ORAL | Status: DC | PRN

## 2021-03-05 MED ORDER — HYDROXYZINE HCL 25 MG PO TABS: 50.0000 mg | ORAL_TABLET | Freq: Three times a day (TID) | ORAL | Status: DC | PRN

## 2021-03-05 MED ORDER — HYDROCODONE-ACETAMINOPHEN 5-325 MG PO TABS: 1.0000 | ORAL_TABLET | ORAL | Status: DC | PRN

## 2021-03-05 MED ORDER — ATORVASTATIN CALCIUM 40 MG PO TABS
40.0000 mg | ORAL_TABLET | Freq: Every day | ORAL | Status: DC
  Administered 2021-03-05: 40 mg via ORAL
  Filled 2021-03-05: qty 1

## 2021-03-05 MED ORDER — CHLORHEXIDINE GLUCONATE CLOTH 2 % EX PADS: 6.0000 | MEDICATED_PAD | Freq: Once | CUTANEOUS | Status: DC

## 2021-03-05 MED ORDER — ROCURONIUM BROMIDE 10 MG/ML (PF) SYRINGE
PREFILLED_SYRINGE | INTRAVENOUS | Status: DC | PRN
  Administered 2021-03-05: 50 mg via INTRAVENOUS
  Administered 2021-03-05: 20 mg via INTRAVENOUS

## 2021-03-05 MED ORDER — ONDANSETRON HCL 4 MG/2ML IJ SOLN
INTRAMUSCULAR | Status: AC
  Filled 2021-03-05: qty 2

## 2021-03-05 MED ORDER — PANTOPRAZOLE SODIUM 40 MG PO TBEC
40.0000 mg | DELAYED_RELEASE_TABLET | Freq: Every evening | ORAL | Status: DC
  Administered 2021-03-05: 40 mg via ORAL
  Filled 2021-03-05: qty 1

## 2021-03-05 MED ORDER — VANCOMYCIN HCL IN DEXTROSE 1-5 GM/200ML-% IV SOLN
1000.0000 mg | INTRAVENOUS | Status: AC
  Administered 2021-03-05: 1000 mg via INTRAVENOUS
  Filled 2021-03-05: qty 200

## 2021-03-05 MED ORDER — HYDROCODONE-ACETAMINOPHEN 10-325 MG PO TABS
1.0000 | ORAL_TABLET | ORAL | Status: DC | PRN
  Administered 2021-03-05: 1 via ORAL
  Administered 2021-03-05 – 2021-03-06 (×2): 2 via ORAL
  Administered 2021-03-06: 1 via ORAL
  Filled 2021-03-05 (×2): qty 1
  Filled 2021-03-05 (×2): qty 2

## 2021-03-05 MED ORDER — DEXAMETHASONE SODIUM PHOSPHATE 10 MG/ML IJ SOLN
INTRAMUSCULAR | Status: AC
  Filled 2021-03-05: qty 1

## 2021-03-05 MED ORDER — THROMBIN 5000 UNITS EX SOLR
CUTANEOUS | Status: AC
  Filled 2021-03-05: qty 15000

## 2021-03-05 MED ORDER — ALLOPURINOL 100 MG PO TABS
100.0000 mg | ORAL_TABLET | Freq: Every evening | ORAL | Status: DC
  Administered 2021-03-05: 100 mg via ORAL
  Filled 2021-03-05: qty 1

## 2021-03-05 MED ORDER — ORAL CARE MOUTH RINSE: 15.0000 mL | Freq: Once | OROMUCOSAL | Status: AC

## 2021-03-05 MED ORDER — AMISULPRIDE (ANTIEMETIC) 5 MG/2ML IV SOLN: 10.0000 mg | Freq: Once | INTRAVENOUS | Status: DC | PRN

## 2021-03-05 MED ORDER — ONDANSETRON HCL 4 MG/2ML IJ SOLN
INTRAMUSCULAR | Status: DC | PRN
  Administered 2021-03-05: 4 mg via INTRAVENOUS

## 2021-03-05 MED ORDER — LISINOPRIL 20 MG PO TABS
40.0000 mg | ORAL_TABLET | Freq: Every evening | ORAL | Status: DC
  Administered 2021-03-05: 40 mg via ORAL
  Filled 2021-03-05: qty 2

## 2021-03-05 MED ORDER — 0.9 % SODIUM CHLORIDE (POUR BTL) OPTIME
TOPICAL | Status: DC | PRN
  Administered 2021-03-05: 1000 mL

## 2021-03-05 MED ORDER — FLUOXETINE HCL 20 MG PO CAPS
40.0000 mg | ORAL_CAPSULE | Freq: Every evening | ORAL | Status: DC
  Administered 2021-03-05: 40 mg via ORAL
  Filled 2021-03-05: qty 2

## 2021-03-05 MED ORDER — FENTANYL CITRATE (PF) 100 MCG/2ML IJ SOLN
25.0000 ug | INTRAMUSCULAR | Status: DC | PRN
  Administered 2021-03-05 (×3): 25 ug via INTRAVENOUS

## 2021-03-05 MED ORDER — PHENYLEPHRINE 40 MCG/ML (10ML) SYRINGE FOR IV PUSH (FOR BLOOD PRESSURE SUPPORT)
PREFILLED_SYRINGE | INTRAVENOUS | Status: DC | PRN
  Administered 2021-03-05: 80 ug via INTRAVENOUS

## 2021-03-05 MED ORDER — CYCLOBENZAPRINE HCL 10 MG PO TABS
10.0000 mg | ORAL_TABLET | Freq: Three times a day (TID) | ORAL | Status: DC | PRN
  Administered 2021-03-05 – 2021-03-06 (×2): 10 mg via ORAL
  Filled 2021-03-05 (×2): qty 1

## 2021-03-05 MED ORDER — FENTANYL CITRATE (PF) 100 MCG/2ML IJ SOLN
INTRAMUSCULAR | Status: AC
  Filled 2021-03-05: qty 2

## 2021-03-05 MED ORDER — SODIUM CHLORIDE 0.9% FLUSH: 3.0000 mL | INTRAVENOUS | Status: DC | PRN

## 2021-03-05 MED ORDER — PROPOFOL 10 MG/ML IV BOLUS
INTRAVENOUS | Status: DC | PRN
  Administered 2021-03-05: 130 mg via INTRAVENOUS

## 2021-03-05 MED ORDER — LIDOCAINE 2% (20 MG/ML) 5 ML SYRINGE
INTRAMUSCULAR | Status: DC | PRN
  Administered 2021-03-05: 60 mg via INTRAVENOUS

## 2021-03-05 MED ORDER — FENTANYL CITRATE (PF) 250 MCG/5ML IJ SOLN
INTRAMUSCULAR | Status: DC | PRN
  Administered 2021-03-05: 100 ug via INTRAVENOUS
  Administered 2021-03-05: 50 ug via INTRAVENOUS

## 2021-03-05 MED ORDER — PROPOFOL 10 MG/ML IV BOLUS
INTRAVENOUS | Status: AC
  Filled 2021-03-05: qty 20

## 2021-03-05 MED ORDER — ACETAMINOPHEN 500 MG PO TABS
ORAL_TABLET | ORAL | Status: AC
  Filled 2021-03-05: qty 2

## 2021-03-05 MED ORDER — CHLORHEXIDINE GLUCONATE 0.12 % MT SOLN
15.0000 mL | Freq: Once | OROMUCOSAL | Status: AC
  Administered 2021-03-05: 15 mL via OROMUCOSAL
  Filled 2021-03-05: qty 15

## 2021-03-05 MED ORDER — MENTHOL 3 MG MT LOZG: 1.0000 | LOZENGE | OROMUCOSAL | Status: DC | PRN

## 2021-03-05 MED ORDER — FENTANYL CITRATE (PF) 250 MCG/5ML IJ SOLN
INTRAMUSCULAR | Status: AC
  Filled 2021-03-05: qty 5

## 2021-03-05 MED ORDER — PHENYLEPHRINE HCL-NACL 20-0.9 MG/250ML-% IV SOLN
INTRAVENOUS | Status: DC | PRN
  Administered 2021-03-05: 20 ug/min via INTRAVENOUS

## 2021-03-05 MED ORDER — TRAZODONE HCL 50 MG PO TABS
200.0000 mg | ORAL_TABLET | Freq: Every day | ORAL | Status: DC
  Administered 2021-03-05: 200 mg via ORAL
  Filled 2021-03-05: qty 1

## 2021-03-05 MED ORDER — SODIUM CHLORIDE 0.9% FLUSH
3.0000 mL | Freq: Two times a day (BID) | INTRAVENOUS | Status: DC
  Administered 2021-03-05: 3 mL via INTRAVENOUS

## 2021-03-05 MED ORDER — ACETAMINOPHEN 650 MG RE SUPP: 650.0000 mg | RECTAL | Status: DC | PRN

## 2021-03-05 MED ORDER — CEFAZOLIN SODIUM-DEXTROSE 1-4 GM/50ML-% IV SOLN
1.0000 g | Freq: Three times a day (TID) | INTRAVENOUS | Status: AC
  Administered 2021-03-05 (×2): 1 g via INTRAVENOUS
  Filled 2021-03-05 (×2): qty 50

## 2021-03-05 MED ORDER — SUGAMMADEX SODIUM 200 MG/2ML IV SOLN
INTRAVENOUS | Status: DC | PRN
  Administered 2021-03-05: 200 mg via INTRAVENOUS

## 2021-03-05 MED ORDER — SODIUM CHLORIDE 0.9 % IV SOLN
250.0000 mL | INTRAVENOUS | Status: DC
  Administered 2021-03-05: 250 mL via INTRAVENOUS

## 2021-03-05 MED ORDER — PHENOL 1.4 % MT LIQD: 1.0000 | OROMUCOSAL | Status: DC | PRN

## 2021-03-05 MED ORDER — MIDAZOLAM HCL 2 MG/2ML IJ SOLN
INTRAMUSCULAR | Status: DC | PRN
  Administered 2021-03-05: 2 mg via INTRAVENOUS

## 2021-03-05 MED ORDER — QUETIAPINE FUMARATE ER 50 MG PO TB24
150.0000 mg | ORAL_TABLET | Freq: Every day | ORAL | Status: DC
  Administered 2021-03-05: 150 mg via ORAL
  Filled 2021-03-05: qty 3

## 2021-03-05 MED ORDER — ACETAMINOPHEN 500 MG PO TABS
1000.0000 mg | ORAL_TABLET | Freq: Once | ORAL | Status: AC
  Administered 2021-03-05: 1000 mg via ORAL

## 2021-03-05 MED ORDER — THROMBIN 5000 UNITS EX SOLR: OROMUCOSAL | Status: DC | PRN

## 2021-03-05 MED ORDER — SUCRALFATE 1 G PO TABS: 1.0000 g | ORAL_TABLET | Freq: Three times a day (TID) | ORAL | Status: DC

## 2021-03-05 MED ORDER — ONDANSETRON HCL 4 MG/2ML IJ SOLN: 4.0000 mg | Freq: Four times a day (QID) | INTRAMUSCULAR | Status: DC | PRN

## 2021-03-05 MED ORDER — LACTATED RINGERS IV SOLN: INTRAVENOUS | Status: DC

## 2021-03-05 MED ORDER — MIDAZOLAM HCL 2 MG/2ML IJ SOLN
INTRAMUSCULAR | Status: AC
  Filled 2021-03-05: qty 2

## 2021-03-05 MED ORDER — ACETAMINOPHEN 325 MG PO TABS: 650.0000 mg | ORAL_TABLET | ORAL | Status: DC | PRN

## 2021-03-05 SURGICAL SUPPLY — 56 items
BAG COUNTER SPONGE SURGICOUNT (BAG) ×2 IMPLANT
BAG DECANTER FOR FLEXI CONT (MISCELLANEOUS) ×1 IMPLANT
BAND RUBBER #18 3X1/16 STRL (MISCELLANEOUS) ×4 IMPLANT
BENZOIN TINCTURE PRP APPL 2/3 (GAUZE/BANDAGES/DRESSINGS) ×2 IMPLANT
BIT DRILL 13 (BIT) ×1 IMPLANT
BUR MATCHSTICK NEURO 3.0 LAGG (BURR) ×2 IMPLANT
CAGE PEEK 6X14X11 (Cage) ×2 IMPLANT
CANISTER SUCT 3000ML PPV (MISCELLANEOUS) ×2 IMPLANT
CARTRIDGE OIL MAESTRO DRILL (MISCELLANEOUS) ×1 IMPLANT
DERMABOND ADVANCED (GAUZE/BANDAGES/DRESSINGS) ×1
DERMABOND ADVANCED .7 DNX12 (GAUZE/BANDAGES/DRESSINGS) IMPLANT
DIFFUSER DRILL AIR PNEUMATIC (MISCELLANEOUS) ×2 IMPLANT
DRAPE C-ARM 42X72 X-RAY (DRAPES) ×4 IMPLANT
DRAPE LAPAROTOMY 100X72 PEDS (DRAPES) ×2 IMPLANT
DRAPE MICROSCOPE LEICA (MISCELLANEOUS) ×2 IMPLANT
DRSG OPSITE POSTOP 4X6 (GAUZE/BANDAGES/DRESSINGS) ×1 IMPLANT
DURAPREP 6ML APPLICATOR 50/CS (WOUND CARE) ×2 IMPLANT
ELECT COATED BLADE 2.86 ST (ELECTRODE) ×2 IMPLANT
ELECT REM PT RETURN 9FT ADLT (ELECTROSURGICAL) ×2
ELECTRODE REM PT RTRN 9FT ADLT (ELECTROSURGICAL) ×1 IMPLANT
GAUZE 4X4 16PLY ~~LOC~~+RFID DBL (SPONGE) IMPLANT
GAUZE SPONGE 4X4 12PLY STRL (GAUZE/BANDAGES/DRESSINGS) ×2 IMPLANT
GLOVE EXAM NITRILE XL STR (GLOVE) IMPLANT
GLOVE SURG LTX SZ9 (GLOVE) ×2 IMPLANT
GOWN STRL REUS W/ TWL LRG LVL3 (GOWN DISPOSABLE) IMPLANT
GOWN STRL REUS W/ TWL XL LVL3 (GOWN DISPOSABLE) ×1 IMPLANT
GOWN STRL REUS W/TWL 2XL LVL3 (GOWN DISPOSABLE) IMPLANT
GOWN STRL REUS W/TWL LRG LVL3 (GOWN DISPOSABLE)
GOWN STRL REUS W/TWL XL LVL3 (GOWN DISPOSABLE) ×1
HALTER HD/CHIN CERV TRACTION D (MISCELLANEOUS) ×2 IMPLANT
HEMOSTAT POWDER KIT SURGIFOAM (HEMOSTASIS) ×1 IMPLANT
HEMOSTAT SURGICEL 2X14 (HEMOSTASIS) IMPLANT
KIT BASIN OR (CUSTOM PROCEDURE TRAY) ×2 IMPLANT
KIT TURNOVER KIT B (KITS) ×2 IMPLANT
NDL SPNL 20GX3.5 QUINCKE YW (NEEDLE) ×1 IMPLANT
NEEDLE SPNL 20GX3.5 QUINCKE YW (NEEDLE) ×2 IMPLANT
NS IRRIG 1000ML POUR BTL (IV SOLUTION) ×2 IMPLANT
OIL CARTRIDGE MAESTRO DRILL (MISCELLANEOUS) ×2
PACK LAMINECTOMY NEURO (CUSTOM PROCEDURE TRAY) ×2 IMPLANT
PAD ARMBOARD 7.5X6 YLW CONV (MISCELLANEOUS) ×6 IMPLANT
PLATE VISION ELITE 40MM (Plate) ×1 IMPLANT
SCREW ST 13X4XST VA NS SPNE (Screw) IMPLANT
SCREW ST VAR 4 ATL (Screw) ×6 IMPLANT
SPACER SPNL 11X14X6XPEEK CVD (Cage) IMPLANT
SPCR SPNL 11X14X6XPEEK CVD (Cage) ×2 IMPLANT
SPONGE INTESTINAL PEANUT (DISPOSABLE) ×2 IMPLANT
SPONGE SURGIFOAM ABS GEL SZ50 (HEMOSTASIS) ×2 IMPLANT
STRIP CLOSURE SKIN 1/2X4 (GAUZE/BANDAGES/DRESSINGS) ×2 IMPLANT
SUT VIC AB 3-0 SH 8-18 (SUTURE) ×2 IMPLANT
SUT VIC AB 4-0 RB1 18 (SUTURE) ×2 IMPLANT
TAPE CLOTH 4X10 WHT NS (GAUZE/BANDAGES/DRESSINGS) ×2 IMPLANT
TAPE CLOTH SURG 4X10 WHT LF (GAUZE/BANDAGES/DRESSINGS) ×1 IMPLANT
TOWEL GREEN STERILE (TOWEL DISPOSABLE) ×2 IMPLANT
TOWEL GREEN STERILE FF (TOWEL DISPOSABLE) ×2 IMPLANT
TRAP SPECIMEN MUCUS 40CC (MISCELLANEOUS) ×2 IMPLANT
WATER STERILE IRR 1000ML POUR (IV SOLUTION) ×2 IMPLANT

## 2021-03-05 NOTE — H&P (Signed)
Marisa Sherman is an 61 y.o. female.   Chief Complaint: Neck pain HPI: 61 year old female with neck pain with radiation to both upper extremities left greater than right consistent both with cervical radiculopathy and cervical myelopathy.  Work-up demonstrates evidence of marked spondylosis with cervical stenosis and ongoing cord compression C5-6 and marked spondylosis and neuroforaminal stenosis at C6-7.  Patient has failed conservative management and presents now for two-level anterior cervical decompression and fusion surgery in hopes of improving her symptoms.  Past Medical History:  Diagnosis Date   Abnormal Pap smear of cervix    Allergy    Anxiety    Bipolar disorder (HCC)    Cataract    Complication of anesthesia    Degenerative disc disease at L5-S1 level    Depression    GERD (gastroesophageal reflux disease)    Gout    Hyperlipidemia    Hypertension    Overactive bladder    PONV (postoperative nausea and vomiting)     Past Surgical History:  Procedure Laterality Date   CARPAL TUNNEL RELEASE Bilateral    CERVIX LESION DESTRUCTION  1985   CHOLECYSTECTOMY     DILATION AND CURETTAGE OF UTERUS     EYE SURGERY Bilateral    cataract removal   TONSILLECTOMY     TUBAL LIGATION      Family History  Problem Relation Age of Onset   Depression Mother    Anxiety disorder Mother    Asthma Mother    Bipolar disorder Father    Depression Sister    Anxiety disorder Sister    Depression Brother    Anxiety disorder Brother    Depression Brother    Anxiety disorder Brother    Cancer Neg Hx    Diabetes Neg Hx    Heart disease Neg Hx    Colon cancer Neg Hx    Breast cancer Neg Hx    Ovarian cancer Neg Hx    Cervical cancer Neg Hx    Social History:  reports that she has been smoking cigarettes. She has a 40.00 pack-year smoking history. She has never used smokeless tobacco. She reports that she does not currently use alcohol. She reports that she does not currently use  drugs.  Allergies:  Allergies  Allergen Reactions   Codeine Nausea Only   Diphenhydramine Hcl Other (See Comments)    Pt unsure why   Lithium Itching   Sulfa Antibiotics Nausea And Vomiting   Amoxicillin Nausea And Vomiting    Medications Prior to Admission  Medication Sig Dispense Refill   acetaminophen (TYLENOL) 500 MG tablet Take 1,000 mg by mouth every 6 (six) hours as needed for mild pain.     allopurinol (ZYLOPRIM) 100 MG tablet Take 1 tablet (100 mg total) by mouth daily. (Patient taking differently: Take 100 mg by mouth every evening.) 90 tablet 0   aspirin EC 81 MG tablet Take 81 mg by mouth every evening.     atorvastatin (LIPITOR) 40 MG tablet Take 1 tablet (40 mg total) by mouth daily at 6 PM. 90 tablet 1   FLUoxetine (PROZAC) 40 MG capsule Take 1 capsule (40 mg total) by mouth every morning. (Patient taking differently: Take 40 mg by mouth every evening.) 30 capsule 2   hydrOXYzine (VISTARIL) 50 MG capsule Take 1 capsule (50 mg total) by mouth every 8 (eight) hours as needed for anxiety. 90 capsule 1   lisinopril (ZESTRIL) 40 MG tablet Take 1 tablet (40 mg total) by mouth daily. (Patient  taking differently: Take 40 mg by mouth every evening.) 90 tablet 0   pantoprazole (PROTONIX) 40 MG tablet Take 1 tablet (40 mg total) by mouth daily. (Patient taking differently: Take 40 mg by mouth every evening.) 90 tablet 3   QUEtiapine Fumarate (SEROQUEL XR) 150 MG 24 hr tablet Take 1 tablet (150 mg total) by mouth at bedtime. 30 tablet 3   traZODone (DESYREL) 100 MG tablet TAKE 2 TABLETS BY MOUTH AT BEDTIME AS NEEDED SLEEP (Patient taking differently: Take 200 mg by mouth at bedtime.) 30 tablet 2   sucralfate (CARAFATE) 1 g tablet Take 1 g by mouth 3 (three) times daily.      Results for orders placed or performed during the hospital encounter of 03/05/21 (from the past 48 hour(s))  ABO/Rh     Status: None (Preliminary result)   Collection Time: 03/05/21  8:40 AM  Result Value Ref  Range   ABO/RH(D) PENDING    No results found.  Pertinent items noted in HPI and remainder of comprehensive ROS otherwise negative.  Blood pressure 120/81, pulse 70, temperature 97.7 F (36.5 C), temperature source Oral, resp. rate 17, height 5\' 6"  (1.676 m), weight 81.6 kg, SpO2 97 %.  Patient is awake and alert.  She is oriented and appropriate.  Speech is fluent.  Judgment insight are intact.  Cranial nerve function normal bilateral motor examination reveals some mild weakness of grip and intrinsic strength bilaterally.  She has some mild weakness of wrist extension.  She has decreased sensation pinprick and light touch in her left C6 and C7 dermatomes.  Deep nerves are hypoactive but symmetric.  No evidence of long track signs.  Gait is somewhat spastic peer examination head ears eyes nose throat is unremarked.  Chest and abdomen are benign. Assessment/Plan Cervical spondylosis with myelopathy and radiculopathy.  Plan C5-6, C6-7 anterior cervical discectomy with interbody fusion utilizing interbody cages, local harvested autograft, and anterior plate instrumentation.  Risks and benefits been explained.  Patient wishes to proceed.  Reynol Arnone 03/05/2021, 9:32 AM

## 2021-03-05 NOTE — Transfer of Care (Signed)
Immediate Anesthesia Transfer of Care Note  Patient: Marisa Sherman  Procedure(s) Performed: ACDF - C5-C6 - C6-C7  Patient Location: PACU  Anesthesia Type:General  Level of Consciousness: awake and alert   Airway & Oxygen Therapy: Patient Spontanous Breathing  Post-op Assessment: Report given to RN, Post -op Vital signs reviewed and stable and Patient moving all extremities X 4  Post vital signs: Reviewed and stable  Last Vitals:  Vitals Value Taken Time  BP 154/86 03/05/21 1157  Temp    Pulse 67 03/05/21 1158  Resp 22 03/05/21 1158  SpO2 91 % 03/05/21 1158  Vitals shown include unvalidated device data.  Last Pain:  Vitals:   03/05/21 0848  TempSrc:   PainSc: 8       Patients Stated Pain Goal: 3 (03/05/21 0848)  Complications: No notable events documented.

## 2021-03-05 NOTE — Op Note (Signed)
Date of procedure: 03/05/2021  Date of dictation: Same  Service: Neurosurgery  Preoperative diagnosis: Cervical spondylosis with myelopathy and radiculopathy  Postoperative diagnosis: Same  Procedure Name: C5-6, C6-7 anterior cervical discectomy with interbody fusion utilizing interbody cages, local harvested autograft, and anterior plate instrumentation  Surgeon:Geovanie Winnett A.Vali Capano, M.D.  Asst. Surgeon: Reinaldo Meeker, NP  Anesthesia: General  Indication: 60 year old female with severe neck and bilateral upper extremities pain numbness and weakness left greater than right.  Work-up demonstrates evidence of marked cervical spondylosis and associated stenosis with cord compression and severe exiting neuroforaminal stenosis.  Patient presents now for two-level anterior cervical decompression and fusion in hopes of improving her symptoms.  Operative note: After induction of anesthesia, patient edition supine with neck slightly extended held placed halter traction.  Patient's anterior cervical region prepped and draped sterilely.  Incision made overlying C6.  Dissection performed on the right.  Retractor placed.  Fluoroscopy used.  Levels confirmed.  Disc bases were incised.  Discectomy was performed using various instruments down to level the posterior annulus.  Microscope then brought to the field used out the remainder of the discectomy.  Remaining aspects of annulus and osteophytes removed using high-speed drill down to level of the posterior logical ligament.  Posterior margin was then elevated and resected.  Underlying thecal sac was identified.  Wide central decompression then performed undercutting the bodies of C5 and C6.  Decompression then proceeded to each neural foramina.  Wide anterior foraminotomies were performed on the course the exiting C6 nerve roots bilaterally.  At this point a very thorough decompression been achieved.  There was no evidence of injury to the thecal sac or nerve roots.   Procedures then repeated at C6-7 again without complications.  Wound was then irrigated.  Gelfoam was placed topically for hemostasis then removed.  [employer] anatomic peek cages were then packed with local harvested autograft.  These were then impacted into place at both levels.  Each cage was recessed slightly from the anterior cortical margin.  40 mm Atlantis anterior cervical plate was then placed over the C5, C6 and C7 levels.  This then attached to fluoroscopic guidance using 13 mm variable angle screws to each at all 3 levels.  All 6 screws given final tightening found to be solidly within the bone.  Locking screws engaged all levels.  Final images reveal good position of the cages and the hardware at the proper operative level with normal alignment of spine.  Wound is then irrigated 1 final time.  Hemostasis was assured with the bipolar cautery.  Wounds then closed in layers with Vicryl sutures.  Steri-Strips and sterile dressing were applied.  No apparent complications.  Patient tolerated the procedure well and she returns to the recovery room postop.

## 2021-03-05 NOTE — Progress Notes (Signed)
Orthopedic Tech Progress Note Patient Details:  Marisa Sherman Jul 02, 1960 [id number]  Ortho Devices Type of Ortho Device: Soft collar Ortho Device/Splint Interventions: Ordered      Marisa Sherman 03/05/2021, 1:48 PM

## 2021-03-05 NOTE — Anesthesia Procedure Notes (Signed)
Procedure Name: Intubation Date/Time: 03/05/2021 10:08 AM Performed by: Harden Mo, CRNA Pre-anesthesia Checklist: Patient identified, Emergency Drugs available, Suction available and Patient being monitored Patient Re-evaluated:Patient Re-evaluated prior to induction Oxygen Delivery Method: Circle System Utilized Preoxygenation: Pre-oxygenation with 100% oxygen Induction Type: IV induction Ventilation: Mask ventilation without difficulty and Oral airway inserted - appropriate to patient size Laryngoscope Size: Sabra Heck and 2 Grade View: Grade I Tube type: Oral Tube size: 7.0 mm Number of attempts: 1 Airway Equipment and Method: Stylet and Oral airway Placement Confirmation: ETT inserted through vocal cords under direct vision, positive ETCO2 and breath sounds checked- equal and bilateral Secured at: 22 cm Tube secured with: Tape Dental Injury: Teeth and Oropharynx as per pre-operative assessment

## 2021-03-05 NOTE — Brief Op Note (Signed)
03/05/2021  11:43 AM  PATIENT:  Marisa Sherman  60 y.o. female  PRE-OPERATIVE DIAGNOSIS:  Stenosis  POST-OPERATIVE DIAGNOSIS:  stenosis  PROCEDURE:  Procedure(s) with comments: ACDF - C5-C6 - C6-C7 (N/A) - 3C  SURGEON:  Surgeon(s) and Role:    * Julio Sicks, MD - Primary  PHYSICIAN ASSISTANT:   ASSISTANTSMarland Mcalpine   ANESTHESIA:   general  EBL:  75 mL   BLOOD ADMINISTERED:none  DRAINS: none   LOCAL MEDICATIONS USED:  NONE  SPECIMEN:  No Specimen  DISPOSITION OF SPECIMEN:  N/A  COUNTS:  YES  TOURNIQUET:  * No tourniquets in log *  DICTATION: .Dragon Dictation  PLAN OF CARE: Admit for overnight observation  PATIENT DISPOSITION:  PACU - hemodynamically stable.   Delay start of Pharmacological VTE agent (>24hrs) due to surgical blood loss or risk of bleeding: yes

## 2021-03-05 NOTE — Anesthesia Preprocedure Evaluation (Addendum)
Anesthesia Evaluation  Patient identified by MRN, date of birth, ID band Patient awake    Reviewed: Allergy & Precautions, NPO status , Patient's Chart, lab work & pertinent test results  History of Anesthesia Complications (+) PONV and history of anesthetic complications  Airway Mallampati: II  TM Distance: >3 FB Neck ROM: Full    Dental  (+) Dental Advisory Given   Pulmonary neg pulmonary ROS, Current Smoker and Patient abstained from smoking.,    breath sounds clear to auscultation       Cardiovascular hypertension, Pt. on medications  Rhythm:Regular Rate:Normal     Neuro/Psych  Neuromuscular disease    GI/Hepatic Neg liver ROS, GERD  ,  Endo/Other  negative endocrine ROS  Renal/GU negative Renal ROS     Musculoskeletal   Abdominal   Peds  Hematology negative hematology ROS (+)   Anesthesia Other Findings   Reproductive/Obstetrics                             Lab Results  Component Value Date   CREATININE 0.84 03/03/2021   BUN 6 (L) 03/03/2021   NA 139 03/03/2021   K 3.2 (L) 03/03/2021   CL 102 03/03/2021   CO2 29 03/03/2021   Lab Results  Component Value Date   WBC 10.9 (H) 03/03/2021   HGB 12.5 03/03/2021   HCT 37.6 03/03/2021   MCV 84.7 03/03/2021   PLT 288 03/03/2021    Anesthesia Physical Anesthesia Plan  ASA: 2  Anesthesia Plan: General   Post-op Pain Management: Tylenol PO (pre-op)   Induction:   PONV Risk Score and Plan: 3 and Midazolam, Dexamethasone and Treatment may vary due to age or medical condition  Airway Management Planned: Oral ETT  Additional Equipment: None  Intra-op Plan:   Post-operative Plan: Extubation in OR  Informed Consent: I have reviewed the patients History and Physical, chart, labs and discussed the procedure including the risks, benefits and alternatives for the proposed anesthesia with the patient or authorized representative  who has indicated his/her understanding and acceptance.     Dental advisory given  Plan Discussed with: CRNA  Anesthesia Plan Comments:         Anesthesia Quick Evaluation

## 2021-03-06 DIAGNOSIS — M4722 Other spondylosis with radiculopathy, cervical region: Secondary | ICD-10-CM | POA: Diagnosis not present

## 2021-03-06 DIAGNOSIS — I10 Essential (primary) hypertension: Secondary | ICD-10-CM | POA: Diagnosis not present

## 2021-03-06 DIAGNOSIS — Z79899 Other long term (current) drug therapy: Secondary | ICD-10-CM | POA: Diagnosis not present

## 2021-03-06 DIAGNOSIS — M4802 Spinal stenosis, cervical region: Secondary | ICD-10-CM | POA: Diagnosis not present

## 2021-03-06 DIAGNOSIS — F17210 Nicotine dependence, cigarettes, uncomplicated: Secondary | ICD-10-CM | POA: Diagnosis not present

## 2021-03-06 DIAGNOSIS — M4712 Other spondylosis with myelopathy, cervical region: Secondary | ICD-10-CM | POA: Diagnosis not present

## 2021-03-06 DIAGNOSIS — Z7982 Long term (current) use of aspirin: Secondary | ICD-10-CM | POA: Diagnosis not present

## 2021-03-06 MED ORDER — HYDROCODONE-ACETAMINOPHEN 5-325 MG PO TABS: 1.0000 | ORAL_TABLET | ORAL | 0 refills | Status: DC | PRN

## 2021-03-06 MED ORDER — CYCLOBENZAPRINE HCL 10 MG PO TABS: 10.0000 mg | ORAL_TABLET | Freq: Three times a day (TID) | ORAL | 0 refills | Status: DC | PRN

## 2021-03-06 NOTE — Discharge Instructions (Signed)
Wound Care Keep incision area dry.  You may shower and You may remove outer bandage after 2 days   Do not put any creams, lotions, or ointments on incision. Leave steri-strips on neck.  They will fall off by themselves.  Activity Walk each and every day, increasing distance each day. No lifting greater than 5 lbs.  Avoid excessive neck motion. No driving for 2 weeks; may ride as a passenger locally. Wear neck brace at all times except when showering.  Diet Resume your normal diet.   Return to Work Will be discussed at you follow up appointment.  Call Your Doctor If Any of These Occur Redness, drainage, or swelling at the wound.  Temperature greater than 101 degrees. Severe pain not relieved by pain medication. Increased difficulty swallowing.  Incision starts to come apart.  Follow Up Appt Call  [phone])  for problems.  If you have any hardware placed in your spine, you will need an x-ray before your appointment.

## 2021-03-06 NOTE — TOC Transition Note (Signed)
Transition of Care [facility]) - CM/SW Discharge Note   Patient Details  Name: Marisa Sherman MRN: [id number] Date of Birth: Aug 25, 1960  Transition of Care [facility]) CM/SW Contact:  Bess Kinds, RN Phone Number: [phone] 03/06/2021, 9:53 AM   Clinical Narrative:     Patient to transition home today. No TOC needs identified at this time.   Final next level of care: Home/Self Care Barriers to Discharge: No Barriers Identified   Patient Goals and CMS Choice        Discharge Placement                       Discharge Plan and Services                                     Social Determinants of Health (SDOH) Interventions     Readmission Risk Interventions No flowsheet data found.

## 2021-03-06 NOTE — Progress Notes (Signed)
Patient alert and oriented, mae's well, voiding adequate amount of urine, swallowing without difficulty, no c/o pain at time of discharge. Patient discharged home with family. Script and discharged instructions given to patient. Patient and family stated understanding of instructions given. Patient has an appointment with Dr. Pool  

## 2021-03-06 NOTE — Evaluation (Signed)
Occupational Therapy Evaluation Patient Details Name: Marisa Sherman MRN: [id number] DOB: September 05, 1960 Today's Date: 03/06/2021   History of Present Illness Marisa Sherman is a 61 yo female who underwent  C5-6, C6-7 anterior cervical discectomy with interbody fusion 2/10. PMHx: anxiety, bipolar, depression, HLD, HTN   Clinical Impression   [name] was evaluated s/p the above neck surgery, she is indep at baseline and lives in a group home with good support. After review, pt verbalized great understanding of cervical precautions and compensatory techniques. Upon evaluation pt demonstrated supervision level ADLs and functional mobility without AD with minimal verbal cues. Pt does not have further OT needs, will sign off with no follow up needs. Recommend d/c to home.      Recommendations for follow up therapy are one component of a multi-disciplinary discharge planning process, led by the attending physician.  Recommendations may be updated based on patient status, additional functional criteria and insurance authorization.   Follow Up Recommendations  No OT follow up    Assistance Recommended at Discharge PRN  Patient can return home with the following Assist for transportation;A little help with walking and/or transfers;A little help with bathing/dressing/bathroom    Functional Status Assessment  Patient has had a recent decline in their functional status and demonstrates the ability to make significant improvements in function in a reasonable and predictable amount of time.  Equipment Recommendations  Other (comment) (RW)       Precautions / Restrictions Precautions Precautions: Fall;Cervical Precaution Booklet Issued: Yes (comment) Required Braces or Orthoses: Cervical Brace Cervical Brace: Soft collar Restrictions Weight Bearing Restrictions: No      Mobility Bed Mobility Overal bed mobility: Needs Assistance Bed Mobility: Rolling, Sidelying to Sit, Sit to Sidelying Rolling:  Supervision Sidelying to sit: Supervision     Sit to sidelying: Supervision General bed mobility comments: good log roll with cues    Transfers Overall transfer level: Needs assistance Equipment used: None Transfers: Sit to/from Stand Sit to Stand: Supervision                  Balance Overall balance assessment: No apparent balance deficits (not formally assessed)             ADL either performed or assessed with clinical judgement   ADL Overall ADL's : Needs assistance/impaired           General ADL Comments: Pt demonstrated supervision level ADLs and functional mobility wtih minimal verbal cues for compensatory techniques to adhere to cervical precautions.     Vision Baseline Vision/History: 0 No visual deficits Patient Visual Report: No change from baseline Vision Assessment?: No apparent visual deficits            Pertinent Vitals/Pain Pain Assessment Pain Assessment: Faces Faces Pain Scale: Hurts a little bit Pain Location: sx site Pain Descriptors / Indicators: Discomfort Pain Intervention(s): Limited activity within patient's tolerance, Monitored during session     Hand Dominance Right   Extremity/Trunk Assessment Upper Extremity Assessment Upper Extremity Assessment: Overall WFL for tasks assessed   Lower Extremity Assessment Lower Extremity Assessment: Overall WFL for tasks assessed   Cervical / Trunk Assessment Cervical / Trunk Assessment: Neck Surgery   Communication Communication Communication: No difficulties   Cognition Arousal/Alertness: Awake/alert Behavior During Therapy: WFL for tasks assessed/performed Overall Cognitive Status: Within Functional Limits for tasks assessed             General Comments: recalled cervical precautions and compensatory techniques after review     General Comments  VSS on RA            Home Living Family/patient expects to be discharged to:: Group home Living Arrangements: Group  Home;Non-relatives/Friends Available Help at Discharge: Friend(s);Available 24 hours/day Type of Home: Group Home Home Access: Stairs to enter Entrance Stairs-Number of Steps: 3 Entrance Stairs-Rails: Right Home Layout: Multi-level Alternate Level Stairs-Number of Steps: flight Alternate Level Stairs-Rails: Right Bathroom Shower/Tub: Teacher, early years/pre: Standard     Home Equipment: BSC/3in1   Additional Comments: bed downstairs, bath upstairs      Prior Functioning/Environment Prior Level of Function : Independent/Modified Independent             Mobility Comments: no AD ADLs Comments: indep, does not drive        OT Problem List: Decreased range of motion;Decreased activity tolerance;Decreased knowledge of precautions;Decreased safety awareness      OT Treatment/Interventions: Self-care/ADL training;Therapeutic exercise;Patient/family education;Therapeutic activities;DME and/or AE instruction    OT Goals(Current goals can be found in the care plan section) Acute Rehab OT Goals Patient Stated Goal: home today OT Goal Formulation: All assessment and education complete, DC therapy Time For Goal Achievement: 03/06/21  OT Frequency: Min 2X/week       AM-PAC OT "6 Clicks" Daily Activity     Outcome Measure Help from another person eating meals?: None Help from another person taking care of personal grooming?: A Little Help from another person toileting, which includes using toliet, bedpan, or urinal?: A Little Help from another person bathing (including washing, rinsing, drying)?: A Little Help from another person to put on and taking off regular upper body clothing?: None Help from another person to put on and taking off regular lower body clothing?: A Little 6 Click Score: 20   End of Session Equipment Utilized During Treatment: Cervical collar Nurse Communication: Mobility status  Activity Tolerance: Patient tolerated treatment well Patient  left: in bed;with call bell/phone within reach  OT Visit Diagnosis: Other abnormalities of gait and mobility (R26.89);Muscle weakness (generalized) (M62.81)                Time: [id number] OT Time Calculation (min): 13 min Charges:  OT General Charges $OT Visit: 1 Visit OT Evaluation $OT Eval Low Complexity: 1 Low   Lamiyah Schlotter A Ashaki Frosch 03/06/2021, 9:39 AM

## 2021-03-06 NOTE — Discharge Summary (Signed)
Physician Discharge Summary  Patient ID: Marisa Sherman MRN: [id number] DOB/AGE: Jul 28, 1960 61 y.o.  Admit date: 03/05/2021 Discharge date: 03/06/2021  Admission Diagnoses: Cervical stenosis with myelopathy and radiculopathy   Discharge Diagnoses: Same   Discharged Condition: good  Hospital Course: The patient was admitted on 03/05/2021 and taken to the operating room where the patient underwent ACDF C5-6 C6-7. The patient tolerated the procedure well and was taken to the recovery room and then to the floor in stable condition. The hospital course was routine. There were no complications. The wound remained clean dry and intact. Pt had appropriate neck soreness. No complaints of arm pain or new N/T/W. The patient remained afebrile with stable vital signs, and tolerated a regular diet. The patient continued to increase activities, and pain was well controlled with oral pain medications.   Consults: None  Significant Diagnostic Studies:  Results for orders placed or performed during the hospital encounter of 03/05/21  ABO/Rh  Result Value Ref Range   ABO/RH(D)      O POS Performed at [facility], [address], [place], [place] [address]     DG Cervical Spine 1 View  Result Date: 03/05/2021 CLINICAL DATA:  Fluoroscopic assistance for surgical fusion in the cervical spine EXAM: DG CERVICAL SPINE - 1 VIEW COMPARISON:  02/01/2021 FINDINGS: Fluoroscopic images show surgical fusion at C5-C6 and C6-C7 levels. Fluoroscopic time is 9.3 seconds. Radiation dose is 1.99 mGy. IMPRESSION: Fluoroscopic assistance was provided for anterior surgical fusion at C5-C6 and C6-C7 levels. Electronically Signed   By: Ernie Avena M.D.   On: 03/05/2021 13:33   MR Cervical Spine Wo Contrast  Result Date: 02/18/2021 CLINICAL DATA:  Chronic neck pain, radiating into arms bilaterally EXAM: MRI CERVICAL SPINE WITHOUT CONTRAST TECHNIQUE: Multiplanar, multisequence MR imaging of the cervical spine was  performed. No intravenous contrast was administered. COMPARISON:  None. FINDINGS: Alignment: Trace retrolisthesis C5 on C6 and C6 on C7. Vertebrae: No acute fracture or suspicious osseous lesion. Cord: Normal signal and morphology. Posterior Fossa, vertebral arteries, paraspinal tissues: Negative. Disc levels: C2-C3: Mild disc bulge. No spinal canal stenosis or neural foraminal narrowing. C3-C4: Small central disc protrusion. Right-greater-than-left facet arthropathy. No spinal canal stenosis. Mild right neural foraminal narrowing. C4-C5: Mild disc bulge. Ligamentum flavum hypertrophy. Mild-to-moderate spinal canal stenosis. No neural foraminal narrowing. C5-C6: Trace retrolisthesis with left-greater-than-right disc osteophyte complex. Ligamentum flavum hypertrophy. Facet and uncovertebral hypertrophy. Moderate to severe spinal canal stenosis. Severe left and moderate right neural foraminal narrowing. C6-C7: Trace retrolisthesis with disc osteophyte complex. Uncovertebral and facet arthropathy. Mild spinal canal stenosis. Moderate left and moderate to severe right neural foraminal narrowing. C7-T1: No significant disc bulge. No spinal canal stenosis or neuroforaminal narrowing. IMPRESSION: 1. C5-C6 moderate to severe spinal canal stenosis, with severe left and moderate right neural foraminal narrowing. 2. C4-C5 mild-to-moderate spinal canal stenosis. 3. C6-C7 mild spinal canal stenosis with moderate to severe right and moderate left neural foraminal narrowing. 4. C3-C4 mild right neural foraminal narrowing. Electronically Signed   By: Wiliam Ke M.D.   On: 02/18/2021 01:36   DG C-Arm 1-60 Min-No Report  Result Date: 03/05/2021 Fluoroscopy was utilized by the requesting physician.  No radiographic interpretation.   DG C-Arm 1-60 Min-No Report  Result Date: 03/05/2021 Fluoroscopy was utilized by the requesting physician.  No radiographic interpretation.    Antibiotics:  Anti-infectives (From admission,  onward)    Start     Dose/Rate Route Frequency Ordered Stop   03/05/21 1315  ceFAZolin (  ANCEF) IVPB 1 g/50 mL premix        1 g 100 mL/hr over 30 Minutes Intravenous Every 8 hours 03/05/21 1309 03/05/21 2035   03/05/21 0815  vancomycin (VANCOCIN) IVPB 1000 mg/200 mL premix        1,000 mg 200 mL/hr over 60 Minutes Intravenous On call to O.R. 03/05/21 0810 03/05/21 1003       Discharge Exam: Blood pressure 116/75, pulse 69, temperature 98 F (36.7 C), temperature source Oral, resp. rate 16, height 5\' 6"  (1.676 m), weight 81.6 kg, SpO2 97 %. Neurologic: Grossly normal Dressing dry  Discharge Medications:   Allergies as of 03/06/2021       Reactions   Codeine Nausea Only   Diphenhydramine Hcl Other (See Comments)   Pt unsure why   Lithium Itching   Sulfa Antibiotics Nausea And Vomiting   Amoxicillin Nausea And Vomiting        Medication List     TAKE these medications    acetaminophen 500 MG tablet Commonly known as: TYLENOL Take 1,000 mg by mouth every 6 (six) hours as needed for mild pain.   allopurinol 100 MG tablet Commonly known as: ZYLOPRIM Take 1 tablet (100 mg total) by mouth daily. What changed: when to take this   aspirin EC 81 MG tablet Take 81 mg by mouth every evening.   atorvastatin 40 MG tablet Commonly known as: LIPITOR Take 1 tablet (40 mg total) by mouth daily at 6 PM.   FLUoxetine 40 MG capsule Commonly known as: PROZAC Take 1 capsule (40 mg total) by mouth every morning. What changed: when to take this   HYDROcodone-acetaminophen 5-325 MG tablet Commonly known as: NORCO/VICODIN Take 1 tablet by mouth every 4 (four) hours as needed for moderate pain ((score 4 to 6)).   hydrOXYzine 50 MG capsule Commonly known as: VISTARIL Take 1 capsule (50 mg total) by mouth every 8 (eight) hours as needed for anxiety.   lisinopril 40 MG tablet Commonly known as: ZESTRIL Take 1 tablet (40 mg total) by mouth daily. What changed: when to take this    pantoprazole 40 MG tablet Commonly known as: PROTONIX Take 1 tablet (40 mg total) by mouth daily. What changed: when to take this   QUEtiapine Fumarate 150 MG 24 hr tablet Commonly known as: SEROQUEL XR Take 1 tablet (150 mg total) by mouth at bedtime.   sucralfate 1 g tablet Commonly known as: CARAFATE Take 1 g by mouth 3 (three) times daily.   traZODone 100 MG tablet Commonly known as: DESYREL TAKE 2 TABLETS BY MOUTH AT BEDTIME AS NEEDED SLEEP What changed:  how much to take how to take this when to take this additional instructions        Disposition: Home   Final Dx: ACDF C5-6 C6-7  Discharge Instructions     Call MD for:  difficulty breathing, headache or visual disturbances   Complete by: As directed    Call MD for:  persistant nausea and vomiting   Complete by: As directed    Call MD for:  redness, tenderness, or signs of infection (pain, swelling, redness, odor or green/yellow discharge around incision site)   Complete by: As directed    Call MD for:  severe uncontrolled pain   Complete by: As directed    Call MD for:  temperature >100.4   Complete by: As directed    Diet - low sodium heart healthy   Complete by: As directed  Increase activity slowly   Complete by: As directed    Remove dressing in 24 hours   Complete by: As directed         Follow-up Information     Julio Sicks, MD. Call.   Specialty: Neurosurgery Why: As needed, If symptoms worsen Contact information: [address] [place] [address] [phone]                  Signed: Tia Alert 03/06/2021, 9:03 AM

## 2021-03-08 ENCOUNTER — Encounter: Payer: Medicare Other | Admitting: Internal Medicine

## 2021-03-08 ENCOUNTER — Encounter (HOSPITAL_COMMUNITY): Payer: Self-pay | Admitting: Neurosurgery

## 2021-03-08 NOTE — Anesthesia Postprocedure Evaluation (Signed)
Anesthesia Post Note  Patient: Marisa Sherman  Procedure(s) Performed: ACDF - C5-C6 - C6-C7     Patient location during evaluation: PACU Anesthesia Type: General Level of consciousness: awake and alert Pain management: pain level controlled Vital Signs Assessment: post-procedure vital signs reviewed and stable Respiratory status: spontaneous breathing, nonlabored ventilation, respiratory function stable and patient connected to nasal cannula oxygen Cardiovascular status: blood pressure returned to baseline and stable Postop Assessment: no apparent nausea or vomiting Anesthetic complications: no   No notable events documented.  Last Vitals:  Vitals:   03/06/21 0347 03/06/21 0722  BP: 132/79 116/75  Pulse: 72 69  Resp: 18 16  Temp: 36.6 C 36.7 C  SpO2: 96% 97%    Last Pain:  Vitals:   03/06/21 1008  TempSrc:   PainSc: 3                  Kennieth Rad

## 2021-03-10 ENCOUNTER — Encounter (HOSPITAL_COMMUNITY): Payer: Self-pay | Admitting: Physician Assistant

## 2021-03-10 ENCOUNTER — Telehealth (INDEPENDENT_AMBULATORY_CARE_PROVIDER_SITE_OTHER): Payer: Medicare Other | Admitting: Physician Assistant

## 2021-03-10 DIAGNOSIS — F4310 Post-traumatic stress disorder, unspecified: Secondary | ICD-10-CM

## 2021-03-10 DIAGNOSIS — G4700 Insomnia, unspecified: Secondary | ICD-10-CM | POA: Diagnosis not present

## 2021-03-10 DIAGNOSIS — F1994 Other psychoactive substance use, unspecified with psychoactive substance-induced mood disorder: Secondary | ICD-10-CM | POA: Diagnosis not present

## 2021-03-10 DIAGNOSIS — F419 Anxiety disorder, unspecified: Secondary | ICD-10-CM

## 2021-03-10 MED ORDER — FLUOXETINE HCL 40 MG PO CAPS: 40.0000 mg | ORAL_CAPSULE | Freq: Every evening | ORAL | 2 refills | Status: DC

## 2021-03-10 MED ORDER — QUETIAPINE FUMARATE ER 150 MG PO TB24: 150.0000 mg | ORAL_TABLET | Freq: Every day | ORAL | 3 refills | Status: DC

## 2021-03-10 MED ORDER — HYDROXYZINE PAMOATE 50 MG PO CAPS: 50.0000 mg | ORAL_CAPSULE | Freq: Three times a day (TID) | ORAL | 2 refills | Status: DC | PRN

## 2021-03-10 MED ORDER — TRAZODONE HCL 100 MG PO TABS: 200.0000 mg | ORAL_TABLET | Freq: Every day | ORAL | 2 refills | Status: DC

## 2021-03-10 NOTE — Progress Notes (Signed)
BH MD/PA/NP OP Progress Note  Virtual Visit via Telephone Note  I connected with Marisa Sherman on 03/10/21 at  2:30 PM EST by telephone and verified that I am speaking with the correct person using two identifiers.  Location: Patient: Home Provider: Clinic   I discussed the limitations, risks, security and privacy concerns of performing an evaluation and management service by telephone and the availability of in person appointments. I also discussed with the patient that there may be a patient responsible charge related to this service. The patient expressed understanding and agreed to proceed.  Follow Up Instructions:  I discussed the assessment and treatment plan with the patient. The patient was provided an opportunity to ask questions and all were answered. The patient agreed with the plan and demonstrated an understanding of the instructions.   The patient was advised to call back or seek an in-person evaluation if the symptoms worsen or if the condition fails to improve as anticipated.  I provided 13 minutes of non-face-to-face time during this encounter.  Meta HatchetUchenna E Jaskiran Pata, PA    03/10/2021 10:11 PM Marisa Sherman  MRN:  [id number]  Chief Complaint:  Chief Complaint  Patient presents with   Follow-up   Medication Management   HPI:   Marisa Sherman is a 61 year old female with a past psychiatric history significant for substance-induced mood disorder, anxiety, insomnia, and PTSD who [facility] via virtual telephone visit for follow-up and medication management.  Patient is currently being managed on the following medications:  Trazodone 200 mg at bedtime Fluoxetine 40 mg daily Seroquel XR 150 mg 24-hour tablet at bedtime Hydroxyzine 50 mg every 8 hours as needed  Patient reports no issues or concerns regarding her current medication regimen.  Patient denies the need for dosage adjustments at this time and is requesting  refills on all her medications following the conclusion of the encounter.  Patient states that she recently had spinal surgery due to a degenerative disc.  Patient states that she has not currently hurting after this surgery and is recovering well.  Patient denies experiencing any depressive symptoms and states that her anxiety has been manageable at this time.  A GAD-7 screen was performed with the patient scoring a 2.  Patient is alert and oriented x4, pleasant, calm, cooperative, and fully engaged in conversation during the encounter.  Patient endorses good mood.  Patient denies suicidal or homicidal ideations.  She further denies auditory or visual hallucinations and does not appear to be responding to internal/external stimuli.  Patient endorses fair sleep and receives on average 4 to 6 hours of sleep each night.  Patient endorses good appetite and eats on average 2 meals per day.  Patient denies alcohol consumption and illicit drug use.  Patient endorses tobacco use and smokes on average a pack per day.  Visit Diagnosis:    ICD-10-CM   1. Substance induced mood disorder (HCC)  F19.94 FLUoxetine (PROZAC) 40 MG capsule    QUEtiapine Fumarate (SEROQUEL XR) 150 MG 24 hr tablet    2. PTSD (post-traumatic stress disorder)  F43.10 FLUoxetine (PROZAC) 40 MG capsule    3. Anxiety  F41.9 hydrOXYzine (VISTARIL) 50 MG capsule    QUEtiapine Fumarate (SEROQUEL XR) 150 MG 24 hr tablet    4. Insomnia, unspecified type  G47.00 traZODone (DESYREL) 100 MG tablet      Past Psychiatric History:  Bipolar d/o, substance induced mood disorder, PTSD, anxiety. Was bring seen at Psychiatry clinic [facility] in  [place] for past 4-5 years  Past Medical History:  Past Medical History:  Diagnosis Date   Abnormal Pap smear of cervix    Allergy    Anxiety    Bipolar disorder (HCC)    Cataract    Complication of anesthesia    Degenerative disc disease at L5-S1 level    Depression    GERD (gastroesophageal reflux  disease)    Gout    Hyperlipidemia    Hypertension    Overactive bladder    PONV (postoperative nausea and vomiting)     Past Surgical History:  Procedure Laterality Date   ANTERIOR CERVICAL DECOMP/DISCECTOMY FUSION N/A 03/05/2021   Procedure: ACDF - C5-C6 - C6-C7;  Surgeon: Julio Sicks, MD;  Location: [facility];  Service: Neurosurgery;  Laterality: N/A;  3C   CARPAL TUNNEL RELEASE Bilateral    CERVIX LESION DESTRUCTION  1985   CHOLECYSTECTOMY     DILATION AND CURETTAGE OF UTERUS     EYE SURGERY Bilateral    cataract removal   TONSILLECTOMY     TUBAL LIGATION      Family Psychiatric History:  History of depression, anxiety in mother.  She also informed history of anxiety and depression in her siblings.  Family History:  Family History  Problem Relation Age of Onset   Depression Mother    Anxiety disorder Mother    Asthma Mother    Bipolar disorder Father    Depression Sister    Anxiety disorder Sister    Depression Brother    Anxiety disorder Brother    Depression Brother    Anxiety disorder Brother    Cancer Neg Hx    Diabetes Neg Hx    Heart disease Neg Hx    Colon cancer Neg Hx    Breast cancer Neg Hx    Ovarian cancer Neg Hx    Cervical cancer Neg Hx     Social History:  Social History   Socioeconomic History   Marital status: Divorced    Spouse name: Not on file   Number of children: 3   Years of education: Not on file   Highest education level: GED or equivalent  Occupational History   Occupation: Disablilty  Tobacco Use   Smoking status: Every Day    Packs/day: 1.00    Years: 40.00    Pack years: 40.00    Types: Cigarettes   Smokeless tobacco: Never   Tobacco comments:    started smoking at age 80; has quit on and off throughout the years. Has decreased cig use to 0.5 PPD  Vaping Use   Vaping Use: Never used  Substance and Sexual Activity   Alcohol use: Not Currently   Drug use: Not Currently    Comment: 90 days recovery   Sexual activity: Not  Currently  Other Topics Concern   Not on file  Social History Narrative   Not on file   Social Determinants of Health   Financial Resource Strain: Low Risk    Difficulty of Paying Living Expenses: Not hard at all  Food Insecurity: No Food Insecurity   Worried About Programme researcher, broadcasting/film/video in the Last Year: Never true   Ran Out of Food in the Last Year: Never true  Transportation Needs: No Transportation Needs   Lack of Transportation (Medical): No   Lack of Transportation (Non-Medical): No  Physical Activity: Sufficiently Active   Days of Exercise per Week: 7 days   Minutes of Exercise per Session: 30 min  Stress: Stress Concern Present   Feeling of Stress : Rather much  Social Connections: Moderately Isolated   Frequency of Communication with Friends and Family: Twice a week   Frequency of Social Gatherings with Friends and Family: Never   Attends Religious Services: 1 to 4 times per year   Active Member of [employer] or Organizations: Yes   Attends Engineer, structuralClub or Organization Meetings: More than 4 times per year   Marital Status: Divorced    Allergies:  Allergies  Allergen Reactions   Codeine Nausea Only   Diphenhydramine Hcl Other (See Comments)    Pt unsure why   Lithium Itching   Sulfa Antibiotics Nausea And Vomiting   Amoxicillin Nausea And Vomiting    Metabolic Disorder Labs: No results found for: HGBA1C, MPG No results found for: PROLACTIN Lab Results  Component Value Date   CHOL 165 04/13/2020   TRIG 147 04/13/2020   HDL 41 04/13/2020   CHOLHDL 4.0 04/13/2020   LDLCALC 98 04/13/2020   LDLCALC 90 04/01/2019   Lab Results  Component Value Date   TSH 2.610 04/13/2020   TSH 1.720 07/05/2013    Therapeutic Level Labs: Lab Results  Component Value Date   LITHIUM 0.50 (L) 07/10/2013   LITHIUM <0.25 (L) 07/01/2013   No results found for: VALPROATE No components found for:  CBMZ  Current Medications: Current Outpatient Medications  Medication Sig Dispense Refill    acetaminophen (TYLENOL) 500 MG tablet Take 1,000 mg by mouth every 6 (six) hours as needed for mild pain.     allopurinol (ZYLOPRIM) 100 MG tablet Take 1 tablet (100 mg total) by mouth daily. (Patient taking differently: Take 100 mg by mouth every evening.) 90 tablet 0   aspirin EC 81 MG tablet Take 81 mg by mouth every evening.     atorvastatin (LIPITOR) 40 MG tablet Take 1 tablet (40 mg total) by mouth daily at 6 PM. 90 tablet 1   cyclobenzaprine (FLEXERIL) 10 MG tablet Take 1 tablet (10 mg total) by mouth 3 (three) times daily as needed for muscle spasms. 60 tablet 0   FLUoxetine (PROZAC) 40 MG capsule Take 1 capsule (40 mg total) by mouth every evening. 30 capsule 2   HYDROcodone-acetaminophen (NORCO/VICODIN) 5-325 MG tablet Take 1 tablet by mouth every 4 (four) hours as needed for moderate pain ((score 4 to 6)). 30 tablet 0   hydrOXYzine (VISTARIL) 50 MG capsule Take 1 capsule (50 mg total) by mouth every 8 (eight) hours as needed for anxiety. 90 capsule 2   lisinopril (ZESTRIL) 40 MG tablet Take 1 tablet (40 mg total) by mouth daily. (Patient taking differently: Take 40 mg by mouth every evening.) 90 tablet 0   pantoprazole (PROTONIX) 40 MG tablet Take 1 tablet (40 mg total) by mouth daily. (Patient taking differently: Take 40 mg by mouth every evening.) 90 tablet 3   QUEtiapine Fumarate (SEROQUEL XR) 150 MG 24 hr tablet Take 1 tablet (150 mg total) by mouth at bedtime. 30 tablet 3   sucralfate (CARAFATE) 1 g tablet Take 1 g by mouth 3 (three) times daily.     traZODone (DESYREL) 100 MG tablet Take 2 tablets (200 mg total) by mouth at bedtime. 60 tablet 2   No current facility-administered medications for this visit.     Musculoskeletal: Strength & Muscle Tone: Unable to assess due to telemedicine visit Gait & Station: Unable to assess due to telemedicine visit Patient leans: Unable to assess due to telemedicine visit  Psychiatric Specialty  Exam: Review of Systems   Psychiatric/Behavioral:  Negative for decreased concentration, dysphoric mood, hallucinations, self-injury, sleep disturbance and suicidal ideas. The patient is not nervous/anxious and is not hyperactive.    There were no vitals taken for this visit.There is no height or weight on file to calculate BMI.  General Appearance: Unable to assess due to telemedicine visit  Eye Contact:  Unable to assess due to telemedicine visit  Speech:  Clear and Coherent and Normal Rate  Volume:  Normal  Mood:  Euthymic  Affect:  Appropriate  Thought Process:  Coherent and Descriptions of Associations: Intact  Orientation:  Full (Time, Place, and Person)  Thought Content: WDL   Suicidal Thoughts:  No  Homicidal Thoughts:  No  Memory:  Immediate;   Good Recent;   Good Remote;   Good  Judgement:  Good  Insight:  Good  Psychomotor Activity:  Normal  Concentration:  Concentration: Good and Attention Span: Good  Recall:  Good  Fund of Knowledge: Good  Language: Good  Akathisia:  No  Handed:  Right  AIMS (if indicated): not done  Assets:  Communication Skills Desire for Improvement Housing Social Support Vocational/Educational  ADL's:  Intact  Cognition: WNL  Sleep:  Fair   Screenings: AUDIT    Flowsheet Row [facility]  Alcohol Use Disorder Identification Test Final Score (AUDIT) 0      GAD-7    Flowsheet Row Video Visit from 03/10/2021 in [facility] Video Visit from 01/06/2021 in [facility] Video Visit from 11/06/2020 in [facility] Office Visit from 07/30/2019 in [facility]  Total GAD-7 Score 2 9 13 19       PHQ2-9    Flowsheet Row Video Visit from 03/10/2021 in [facility] Office Visit from 02/01/2021 in [facility] Video Visit from 01/06/2021 in [facility] Video Visit from 11/06/2020 in [facility] Office Visit from 09/30/2020 in [facility]  PHQ-2 Total Score 0 0 0 2 0  PHQ-9 Total Score -- 6 -- 10 5      Flowsheet Row Video Visit from 03/10/2021 in [facility] Admission (Discharged) from 03/05/2021 in [facility] Video Visit from 01/06/2021 in [facility]  C-SSRS RISK CATEGORY No Risk No Risk Low Risk        Assessment and Plan:   Shantia Sanford is a 61 year old female with a past psychiatric history significant for substance-induced mood disorder, anxiety, insomnia, and PTSD who presents to [facility] via virtual telephone visit for follow-up and medication management.  Patient reports no issues or concerns regarding her current medication regimen.  Patient denies the need for dosage adjustments at this time and is requesting refills on all her medications following the conclusion of the encounter.  Patient's medications to be e-prescribed to pharmacy of choice.  1. Substance induced mood disorder (HCC)  - FLUoxetine (PROZAC) 40 MG capsule; Take 1 capsule (40 mg total) by mouth every evening.  Dispense: 30 capsule; Refill: 2 - QUEtiapine Fumarate (SEROQUEL XR) 150 MG 24 hr tablet; Take 1 tablet (150 mg total) by mouth at bedtime.  Dispense: 30 tablet; Refill: 3  2. PTSD (post-traumatic stress disorder)  - FLUoxetine (PROZAC) 40 MG capsule; Take 1 capsule (40 mg total) by  mouth every evening.  Dispense: 30 capsule; Refill: 2  3. Anxiety  - hydrOXYzine (VISTARIL) 50 MG capsule; Take 1 capsule (50 mg total) by mouth every 8 (eight) hours as needed for anxiety.  Dispense: 90 capsule; Refill: 2 - QUEtiapine Fumarate (SEROQUEL XR) 150 MG 24 hr tablet; Take 1 tablet (150 mg total) by mouth at bedtime.  Dispense: 30 tablet; Refill: 3  4.  Insomnia, unspecified type  - traZODone (DESYREL) 100 MG tablet; Take 2 tablets (200 mg total) by mouth at bedtime.  Dispense: 60 tablet; Refill: 2  Patient to follow up in 3 months Provider spent a total 13 minutes with the patient/reviewing patient's chart  Meta Hatchet, PA 03/10/2021, 10:11 PM

## 2021-03-11 ENCOUNTER — Other Ambulatory Visit: Payer: Self-pay | Admitting: Internal Medicine

## 2021-03-15 ENCOUNTER — Other Ambulatory Visit: Payer: Self-pay | Admitting: Internal Medicine

## 2021-03-15 DIAGNOSIS — I10 Essential (primary) hypertension: Secondary | ICD-10-CM

## 2021-03-16 ENCOUNTER — Institutional Professional Consult (permissible substitution): Payer: Medicare Other | Admitting: Neurology

## 2021-03-17 ENCOUNTER — Other Ambulatory Visit (HOSPITAL_COMMUNITY): Payer: Self-pay | Admitting: Physician Assistant

## 2021-03-17 DIAGNOSIS — F4310 Post-traumatic stress disorder, unspecified: Secondary | ICD-10-CM

## 2021-03-17 DIAGNOSIS — F1994 Other psychoactive substance use, unspecified with psychoactive substance-induced mood disorder: Secondary | ICD-10-CM

## 2021-03-18 ENCOUNTER — Other Ambulatory Visit (HOSPITAL_COMMUNITY): Payer: Self-pay | Admitting: Physician Assistant

## 2021-03-18 DIAGNOSIS — F419 Anxiety disorder, unspecified: Secondary | ICD-10-CM

## 2021-03-18 DIAGNOSIS — F1994 Other psychoactive substance use, unspecified with psychoactive substance-induced mood disorder: Secondary | ICD-10-CM

## 2021-04-08 DIAGNOSIS — M4802 Spinal stenosis, cervical region: Secondary | ICD-10-CM | POA: Diagnosis not present

## 2021-04-14 ENCOUNTER — Other Ambulatory Visit (HOSPITAL_COMMUNITY): Payer: Self-pay | Admitting: Physician Assistant

## 2021-04-14 DIAGNOSIS — F419 Anxiety disorder, unspecified: Secondary | ICD-10-CM

## 2021-04-14 DIAGNOSIS — F1994 Other psychoactive substance use, unspecified with psychoactive substance-induced mood disorder: Secondary | ICD-10-CM

## 2021-04-14 DIAGNOSIS — F4310 Post-traumatic stress disorder, unspecified: Secondary | ICD-10-CM

## 2021-04-20 ENCOUNTER — Telehealth (HOSPITAL_COMMUNITY): Payer: Self-pay | Admitting: *Deleted

## 2021-04-20 ENCOUNTER — Other Ambulatory Visit (HOSPITAL_COMMUNITY): Payer: Self-pay | Admitting: Physician Assistant

## 2021-04-20 DIAGNOSIS — G4700 Insomnia, unspecified: Secondary | ICD-10-CM

## 2021-04-20 MED ORDER — TRAZODONE HCL 100 MG PO TABS: 200.0000 mg | ORAL_TABLET | Freq: Every day | ORAL | 2 refills | Status: DC

## 2021-04-20 NOTE — Progress Notes (Signed)
Provider was contacted by Glory Buff. Olevia Bowens, RN regarding patient's request for medication to be sent to [facility]. Patient's trazodone to be sent to requested pharmacy. ?

## 2021-04-20 NOTE — Telephone Encounter (Signed)
Provider was contacted by Suzanne K. Beck, RN regarding patient's request for medication to be sent to [facility]. Patient's trazodone to be sent to requested pharmacy. ?

## 2021-04-20 NOTE — Telephone Encounter (Signed)
Request from [facility] for a new rx for her Trazdone to be sent in to them. Reveiwed the chart and she has rx with refills but it is at [facility] on [address]. I called her to clarify her pharmacy and if [facility] was her choice to move the rx to. She said yes she wanted all of her meds to be sent to [facility]. Will notify Eddie PA to change her Trazodone rx to [facility] going forward and made it her only pharmacy in her chart. ?

## 2021-05-08 ENCOUNTER — Other Ambulatory Visit: Payer: Self-pay | Admitting: Internal Medicine

## 2021-05-10 ENCOUNTER — Ambulatory Visit: Payer: Medicare Other | Admitting: Internal Medicine

## 2021-05-15 ENCOUNTER — Other Ambulatory Visit: Payer: Self-pay | Admitting: Internal Medicine

## 2021-05-18 ENCOUNTER — Other Ambulatory Visit: Payer: Self-pay | Admitting: Family Medicine

## 2021-05-20 ENCOUNTER — Ambulatory Visit: Payer: Medicare Other | Admitting: Internal Medicine

## 2021-05-27 ENCOUNTER — Other Ambulatory Visit: Payer: Self-pay | Admitting: Internal Medicine

## 2021-05-27 DIAGNOSIS — I10 Essential (primary) hypertension: Secondary | ICD-10-CM

## 2021-06-08 ENCOUNTER — Other Ambulatory Visit (HOSPITAL_COMMUNITY): Payer: Self-pay | Admitting: Physician Assistant

## 2021-06-08 DIAGNOSIS — F419 Anxiety disorder, unspecified: Secondary | ICD-10-CM

## 2021-06-09 ENCOUNTER — Encounter: Payer: Self-pay | Admitting: Internal Medicine

## 2021-06-09 ENCOUNTER — Ambulatory Visit (INDEPENDENT_AMBULATORY_CARE_PROVIDER_SITE_OTHER): Payer: Medicare Other | Admitting: Internal Medicine

## 2021-06-09 ENCOUNTER — Encounter (HOSPITAL_COMMUNITY): Payer: Self-pay

## 2021-06-09 ENCOUNTER — Telehealth (HOSPITAL_COMMUNITY): Payer: Medicare Other | Admitting: Physician Assistant

## 2021-06-09 VITALS — BP 102/72 | HR 76 | Temp 97.9°F | Wt 182.2 lb

## 2021-06-09 DIAGNOSIS — K5900 Constipation, unspecified: Secondary | ICD-10-CM

## 2021-06-09 DIAGNOSIS — Z124 Encounter for screening for malignant neoplasm of cervix: Secondary | ICD-10-CM

## 2021-06-09 DIAGNOSIS — Z1211 Encounter for screening for malignant neoplasm of colon: Secondary | ICD-10-CM | POA: Diagnosis not present

## 2021-06-09 DIAGNOSIS — Z23 Encounter for immunization: Secondary | ICD-10-CM | POA: Diagnosis not present

## 2021-06-09 NOTE — Progress Notes (Signed)
? ? ? ?Established Patient Office Visit ? ? ? ? ?CC/Reason for Visit: Discuss constipation ? ?HPI: Marisa Sherman is a 61 y.o. female who is coming in today for the above mentioned reasons.  She has been feeling very constipated.  This has been an ongoing issue for years.  However in the last few weeks it has gotten significantly worse.  Her last bowel movement was 5 days ago.  It has gotten to the point where she is usually afraid to have a bowel movement because her stool is very hard and painful to pass.  For the past couple days she has been taking docusate every night.  She is due for her second shingles vaccine.  She is also due for cervical and colon cancer screening. ? ?Past Medical/Surgical History: ?Past Medical History:  ?Diagnosis Date  ? Abnormal Pap smear of cervix   ? Allergy   ? Anxiety   ? Bipolar disorder ([place])   ? Cataract   ? Complication of anesthesia   ? Degenerative disc disease at L5-S1 level   ? Depression   ? GERD (gastroesophageal reflux disease)   ? Gout   ? Hyperlipidemia   ? Hypertension   ? Overactive bladder   ? PONV (postoperative nausea and vomiting)   ? ? ?Past Surgical History:  ?Procedure Laterality Date  ? ANTERIOR CERVICAL DECOMP/DISCECTOMY FUSION N/A 03/05/2021  ? Procedure: ACDF - C5-C6 - C6-C7;  Surgeon: Earnie Larsson, MD;  Location: [place];  Service: Neurosurgery;  Laterality: N/A;  3C  ? CARPAL TUNNEL RELEASE Bilateral   ? CERVIX LESION DESTRUCTION  1985  ? CHOLECYSTECTOMY    ? DILATION AND CURETTAGE OF UTERUS    ? EYE SURGERY Bilateral   ? cataract removal  ? TONSILLECTOMY    ? TUBAL LIGATION    ? ? ?Social History: ? reports that she has been smoking cigarettes. She has a 40.00 pack-year smoking history. She has never used smokeless tobacco. She reports that she does not currently use alcohol. She reports that she does not currently use drugs. ? ?Allergies: ?Allergies  ?Allergen Reactions  ? Codeine Nausea Only  ? Diphenhydramine Hcl Other (See Comments)  ?  Pt unsure why  ?  Lithium Itching  ? Sulfa Antibiotics Nausea And Vomiting  ? Amoxicillin Nausea And Vomiting  ? ? ?Family History:  ?Family History  ?Problem Relation Age of Onset  ? Depression Mother   ? Anxiety disorder Mother   ? Asthma Mother   ? Bipolar disorder Father   ? Depression Sister   ? Anxiety disorder Sister   ? Depression Brother   ? Anxiety disorder Brother   ? Depression Brother   ? Anxiety disorder Brother   ? Cancer Neg Hx   ? Diabetes Neg Hx   ? Heart disease Neg Hx   ? Colon cancer Neg Hx   ? Breast cancer Neg Hx   ? Ovarian cancer Neg Hx   ? Cervical cancer Neg Hx   ? ? ? ?Current Outpatient Medications:  ?  acetaminophen (TYLENOL) 500 MG tablet, Take 1,000 mg by mouth every 6 (six) hours as needed for mild pain., Disp: , Rfl:  ?  allopurinol (ZYLOPRIM) 100 MG tablet, TAKE 1 TABLET BY MOUTH DAILY, Disp: 90 tablet, Rfl: 0 ?  aspirin EC 81 MG tablet, Take 81 mg by mouth every evening., Disp: , Rfl:  ?  atorvastatin (LIPITOR) 40 MG tablet, TAKE 1 TABLET BY MOUTH DAILY AT  6 PM, Disp:  90 tablet, Rfl: 0 ?  cyclobenzaprine (FLEXERIL) 10 MG tablet, Take 1 tablet (10 mg total) by mouth 3 (three) times daily as needed for muscle spasms., Disp: 60 tablet, Rfl: 0 ?  FLUoxetine (PROZAC) 40 MG capsule, TAKE 1 CAPSULE BY MOUTH IN THE  MORNING, Disp: 30 capsule, Rfl: 11 ?  hydrOXYzine (VISTARIL) 50 MG capsule, TAKE 1 CAPSULE BY MOUTH EVERY 8  HOURS AS NEEDED FOR ANXIETY, Disp: 180 capsule, Rfl: 1 ?  lisinopril (ZESTRIL) 40 MG tablet, TAKE 1 TABLET BY MOUTH DAILY, Disp: 90 tablet, Rfl: 1 ?  pantoprazole (PROTONIX) 40 MG tablet, Take 1 tablet (40 mg total) by mouth daily. (Patient taking differently: Take 40 mg by mouth every evening.), Disp: 90 tablet, Rfl: 3 ?  QUEtiapine Fumarate (SEROQUEL XR) 150 MG 24 hr tablet, TAKE 1 TABLET BY MOUTH AT  BEDTIME, Disp: 30 tablet, Rfl: 11 ?  sucralfate (CARAFATE) 1 g tablet, Take 1 g by mouth 3 (three) times daily., Disp: , Rfl:  ?  traZODone (DESYREL) 100 MG tablet, Take 2 tablets (200  mg total) by mouth at bedtime., Disp: 60 tablet, Rfl: 2 ? ?Review of Systems:  ?Constitutional: Denies fever, chills, diaphoresis, appetite change and fatigue.  ?HEENT: Denies photophobia, eye pain, redness, hearing loss, ear pain, congestion, sore throat, rhinorrhea, sneezing, mouth sores, trouble swallowing, neck pain, neck stiffness and tinnitus.   ?Respiratory: Denies SOB, DOE, cough, chest tightness,  and wheezing.   ?Cardiovascular: Denies chest pain, palpitations and leg swelling.  ?Gastrointestinal: Denies nausea, vomiting, abdominal pain, diarrhea, blood in stool and abdominal distention.  ?Genitourinary: Denies dysuria, urgency, frequency, hematuria, flank pain and difficulty urinating.  ?Endocrine: Denies: hot or cold intolerance, sweats, changes in hair or nails, polyuria, polydipsia. ?Musculoskeletal: Denies myalgias, back pain, joint swelling, arthralgias and gait problem.  ?Skin: Denies pallor, rash and wound.  ?Neurological: Denies dizziness, seizures, syncope, weakness, light-headedness, numbness and headaches.  ?Hematological: Denies adenopathy. Easy bruising, personal or family bleeding history  ?Psychiatric/Behavioral: Denies suicidal ideation, mood changes, confusion, nervousness, sleep disturbance and agitation ? ? ? ?Physical Exam: ?Vitals:  ? 06/09/21 1340  ?BP: 102/72  ?Pulse: 76  ?Temp: 97.9 ?F (36.6 ?C)  ?TempSrc: Oral  ?SpO2: 100%  ?Weight: 182 lb 3.2 oz (82.6 kg)  ? ? ?Body mass index is 29.41 kg/m?. ? ? ?Constitutional: NAD, calm, comfortable ?Eyes: PERRL, lids and conjunctivae normal ?ENMT: Mucous membranes are moist.  ?Abdomen: no tenderness, no masses palpated. No hepatosplenomegaly. Bowel sounds positive.  ?Psychiatric: Normal judgment and insight. Alert and oriented x 3. Normal mood.  ? ? ?Impression and Plan: ? ?Constipation, unspecified constipation type ?-We discussed increasing fluid and fiber consumption.  She will start taking MiraLAX twice daily as well as docusate twice  daily with a goal of 1 soft bowel movement at least every other day.  If no improvement can consider mag citrate as a next step. ? ?Screening for malignant neoplasm of colon  ?- Plan: Ambulatory referral to Gastroenterology ? ?Screening for cervical cancer ? - Plan: Ambulatory referral to Obstetrics / Gynecology ? ?Need for shingles vaccine  ?- Plan: Varicella-zoster vaccine IM (Shingrix) ? ? ? ?Time spent:30 minutes reviewing chart, interviewing and examining patient and formulating plan of care. ? ? ? ?Lelon Frohlich, MD ?[facility] ? ? ?

## 2021-06-22 ENCOUNTER — Other Ambulatory Visit (HOSPITAL_COMMUNITY): Payer: Self-pay | Admitting: Physician Assistant

## 2021-06-22 DIAGNOSIS — G4700 Insomnia, unspecified: Secondary | ICD-10-CM

## 2021-06-24 NOTE — Telephone Encounter (Signed)
Patient needs a follow-up appointment before medication can be dispensed to her

## 2021-07-01 ENCOUNTER — Ambulatory Visit (INDEPENDENT_AMBULATORY_CARE_PROVIDER_SITE_OTHER): Payer: Medicare Other | Admitting: Internal Medicine

## 2021-07-01 ENCOUNTER — Encounter: Payer: Self-pay | Admitting: Internal Medicine

## 2021-07-01 VITALS — BP 110/74 | HR 67 | Temp 98.1°F | Wt 188.7 lb

## 2021-07-01 DIAGNOSIS — M79601 Pain in right arm: Secondary | ICD-10-CM

## 2021-07-01 DIAGNOSIS — M79602 Pain in left arm: Secondary | ICD-10-CM | POA: Diagnosis not present

## 2021-07-01 NOTE — Progress Notes (Signed)
Established Patient Office Visit     CC/Reason for Visit: Chronic bilateral arm pain  HPI: Marisa Sherman is a 61 y.o. female who is coming in today for the above mentioned reasons.  She is here today complaining of chronic bilateral upper arm pain that has been present for years at least 12 that she can count back.  On February 10 she had ACDF of C5-6 and C6-7 for cervical myelopathy and radiculopathy.  She was hoping that the arm pain would have improved but it has not.  She works as a Copywriter, advertising at [employer], this arm pain causes a lot of issues for her.  She has not discussed this with her neurosurgeon.  Past Medical/Surgical History: Past Medical History:  Diagnosis Date   Abnormal Pap smear of cervix    Allergy    Anxiety    Bipolar disorder (HCC)    Cataract    Complication of anesthesia    Degenerative disc disease at L5-S1 level    Depression    GERD (gastroesophageal reflux disease)    Gout    Hyperlipidemia    Hypertension    Overactive bladder    PONV (postoperative nausea and vomiting)     Past Surgical History:  Procedure Laterality Date   ANTERIOR CERVICAL DECOMP/DISCECTOMY FUSION N/A 03/05/2021   Procedure: ACDF - C5-C6 - C6-C7;  Surgeon: Julio Sicks, MD;  Location: [facility];  Service: Neurosurgery;  Laterality: N/A;  3C   CARPAL TUNNEL RELEASE Bilateral    CERVIX LESION DESTRUCTION  1985   CHOLECYSTECTOMY     DILATION AND CURETTAGE OF UTERUS     EYE SURGERY Bilateral    cataract removal   TONSILLECTOMY     TUBAL LIGATION      Social History:  reports that she has been smoking cigarettes. She has a 40.00 pack-year smoking history. She has never used smokeless tobacco. She reports that she does not currently use alcohol. She reports that she does not currently use drugs.  Allergies: Allergies  Allergen Reactions   Codeine Nausea Only   Diphenhydramine Hcl Other (See Comments)    Pt unsure why   Lithium Itching   Sulfa  Antibiotics Nausea And Vomiting   Amoxicillin Nausea And Vomiting    Family History:  Family History  Problem Relation Age of Onset   Depression Mother    Anxiety disorder Mother    Asthma Mother    Bipolar disorder Father    Depression Sister    Anxiety disorder Sister    Depression Brother    Anxiety disorder Brother    Depression Brother    Anxiety disorder Brother    Cancer Neg Hx    Diabetes Neg Hx    Heart disease Neg Hx    Colon cancer Neg Hx    Breast cancer Neg Hx    Ovarian cancer Neg Hx    Cervical cancer Neg Hx      Current Outpatient Medications:    acetaminophen (TYLENOL) 500 MG tablet, Take 1,000 mg by mouth every 6 (six) hours as needed for mild pain., Disp: , Rfl:    allopurinol (ZYLOPRIM) 100 MG tablet, TAKE 1 TABLET BY MOUTH DAILY, Disp: 90 tablet, Rfl: 0   aspirin EC 81 MG tablet, Take 81 mg by mouth every evening., Disp: , Rfl:    atorvastatin (LIPITOR) 40 MG tablet, TAKE 1 TABLET BY MOUTH DAILY AT  6 PM, Disp: 90 tablet, Rfl: 0  FLUoxetine (PROZAC) 40 MG capsule, TAKE 1 CAPSULE BY MOUTH IN THE  MORNING, Disp: 30 capsule, Rfl: 11   hydrOXYzine (VISTARIL) 50 MG capsule, TAKE 1 CAPSULE BY MOUTH EVERY 8  HOURS AS NEEDED FOR ANXIETY, Disp: 180 capsule, Rfl: 1   lisinopril (ZESTRIL) 40 MG tablet, TAKE 1 TABLET BY MOUTH DAILY, Disp: 90 tablet, Rfl: 1   pantoprazole (PROTONIX) 40 MG tablet, Take 1 tablet (40 mg total) by mouth daily. (Patient taking differently: Take 40 mg by mouth every evening.), Disp: 90 tablet, Rfl: 3   QUEtiapine Fumarate (SEROQUEL XR) 150 MG 24 hr tablet, TAKE 1 TABLET BY MOUTH AT  BEDTIME, Disp: 30 tablet, Rfl: 11   sucralfate (CARAFATE) 1 g tablet, Take 1 g by mouth 3 (three) times daily., Disp: , Rfl:    traZODone (DESYREL) 100 MG tablet, Take 2 tablets (200 mg total) by mouth at bedtime., Disp: 60 tablet, Rfl: 2  Review of Systems:  Constitutional: Denies fever, chills, diaphoresis, appetite change and fatigue.  HEENT: Denies  photophobia, eye pain, redness, hearing loss, ear pain, congestion, sore throat, rhinorrhea, sneezing, mouth sores, trouble swallowing, neck pain, neck stiffness and tinnitus.   Respiratory: Denies SOB, DOE, cough, chest tightness,  and wheezing.   Cardiovascular: Denies chest pain, palpitations and leg swelling.  Gastrointestinal: Denies nausea, vomiting, abdominal pain, diarrhea, constipation, blood in stool and abdominal distention.  Genitourinary: Denies dysuria, urgency, frequency, hematuria, flank pain and difficulty urinating.  Endocrine: Denies: hot or cold intolerance, sweats, changes in hair or nails, polyuria, polydipsia. Musculoskeletal: Denies myalgias, back pain, joint swelling, arthralgias and gait problem.  Skin: Denies pallor, rash and wound.  Neurological: Denies dizziness, seizures, syncope, weakness, light-headedness, numbness and headaches.  Hematological: Denies adenopathy. Easy bruising, personal or family bleeding history  Psychiatric/Behavioral: Denies suicidal ideation, mood changes, confusion, nervousness, sleep disturbance and agitation    Physical Exam: Vitals:   07/01/21 1439  BP: 110/74  Pulse: 67  Temp: 98.1 F (36.7 C)  TempSrc: Oral  SpO2: 99%  Weight: 188 lb 11.2 oz (85.6 kg)    Body mass index is 30.46 kg/m.   Constitutional: NAD, calm, comfortable Eyes: PERRL, lids and conjunctivae normal ENMT: Mucous membranes are moist.   Psychiatric: Normal judgment and insight. Alert and oriented x 3. Normal mood.    Impression and Plan:  Bilateral arm pain - Plan: Ambulatory referral to Neurology -Given chronicity of pain I am not sure what can be offered.  I will refer to neurology to see if they have any suggestions.  I have also advised that she reach out to her neurosurgeon for advice.   Time spent:23 minutes reviewing chart, interviewing and examining patient and formulating plan of care.    Chaya Jan, MD [facility]  at [facility]

## 2021-07-25 ENCOUNTER — Other Ambulatory Visit: Payer: Self-pay | Admitting: Internal Medicine

## 2021-08-02 ENCOUNTER — Other Ambulatory Visit: Payer: Self-pay | Admitting: Family Medicine

## 2021-08-03 ENCOUNTER — Other Ambulatory Visit: Payer: Self-pay | Admitting: Internal Medicine

## 2021-08-03 ENCOUNTER — Telehealth: Payer: Self-pay

## 2021-08-03 DIAGNOSIS — Z1231 Encounter for screening mammogram for malignant neoplasm of breast: Secondary | ICD-10-CM

## 2021-08-03 NOTE — Telephone Encounter (Signed)
No documented CPE noted. Pt confirms last mammogram 2021. Order placed in Oct 2022, but has not been scheduled.  Pt notified of above. CPE scheduled. Pt consents to having this RN schedule mammogram at [facility].   [facility] pt needs u/s of breast due to results of mammogram in 2021. Appt scheduled. Pt notified. Will send appt reminder in mail.

## 2021-08-04 ENCOUNTER — Other Ambulatory Visit: Payer: Self-pay | Admitting: Internal Medicine

## 2021-08-04 DIAGNOSIS — I10 Essential (primary) hypertension: Secondary | ICD-10-CM

## 2021-08-12 ENCOUNTER — Ambulatory Visit
Admission: RE | Admit: 2021-08-12 | Discharge: 2021-08-12 | Disposition: A | Discharge status: Home / Self Care | Payer: 59 | Source: Ambulatory Visit | Attending: Internal Medicine | Admitting: Internal Medicine

## 2021-08-12 DIAGNOSIS — Z1231 Encounter for screening mammogram for malignant neoplasm of breast: Secondary | ICD-10-CM

## 2021-08-13 ENCOUNTER — Other Ambulatory Visit: Payer: Self-pay | Admitting: Internal Medicine

## 2021-08-13 DIAGNOSIS — R2232 Localized swelling, mass and lump, left upper limb: Secondary | ICD-10-CM

## 2021-08-16 ENCOUNTER — Encounter: Payer: Self-pay | Admitting: Neurology

## 2021-08-16 ENCOUNTER — Ambulatory Visit: Payer: 59 | Admitting: Neurology

## 2021-08-17 ENCOUNTER — Telehealth (HOSPITAL_COMMUNITY): Payer: 59 | Admitting: Physician Assistant

## 2021-08-18 ENCOUNTER — Other Ambulatory Visit: Payer: Self-pay | Admitting: Internal Medicine

## 2021-08-18 DIAGNOSIS — R2232 Localized swelling, mass and lump, left upper limb: Secondary | ICD-10-CM

## 2021-08-27 ENCOUNTER — Other Ambulatory Visit: Payer: 59

## 2021-08-31 ENCOUNTER — Encounter: Payer: Medicare Other | Admitting: Internal Medicine

## 2021-10-11 ENCOUNTER — Telehealth (HOSPITAL_COMMUNITY): Payer: Self-pay | Admitting: *Deleted

## 2021-10-11 NOTE — Telephone Encounter (Signed)
Patient Called Requested Refill traZODone (DESYREL) 100 MG tablet Take 2 tablets (200 mg total) by mouth  at bedtime

## 2021-10-13 ENCOUNTER — Encounter (HOSPITAL_COMMUNITY): Payer: Self-pay | Admitting: Physician Assistant

## 2021-10-13 ENCOUNTER — Telehealth (INDEPENDENT_AMBULATORY_CARE_PROVIDER_SITE_OTHER): Payer: Medicare Other | Admitting: Physician Assistant

## 2021-10-13 DIAGNOSIS — F1421 Cocaine dependence, in remission: Secondary | ICD-10-CM

## 2021-10-13 DIAGNOSIS — F4310 Post-traumatic stress disorder, unspecified: Secondary | ICD-10-CM

## 2021-10-13 DIAGNOSIS — F1994 Other psychoactive substance use, unspecified with psychoactive substance-induced mood disorder: Secondary | ICD-10-CM

## 2021-10-13 DIAGNOSIS — G4700 Insomnia, unspecified: Secondary | ICD-10-CM

## 2021-10-13 DIAGNOSIS — F419 Anxiety disorder, unspecified: Secondary | ICD-10-CM

## 2021-10-13 MED ORDER — FLUOXETINE HCL 40 MG PO CAPS: 40.0000 mg | ORAL_CAPSULE | Freq: Every morning | ORAL | 3 refills | Status: DC

## 2021-10-13 MED ORDER — HYDROXYZINE PAMOATE 50 MG PO CAPS: ORAL_CAPSULE | ORAL | 1 refills | Status: DC

## 2021-10-13 MED ORDER — QUETIAPINE FUMARATE ER 150 MG PO TB24: 150.0000 mg | ORAL_TABLET | Freq: Every day | ORAL | 3 refills | Status: DC

## 2021-10-13 MED ORDER — TASIMELTEON 20 MG PO CAPS: 20.0000 mg | ORAL_CAPSULE | Freq: Every day | ORAL | 1 refills | Status: DC

## 2021-10-13 NOTE — Progress Notes (Signed)
BH MD/PA/NP OP Progress Note  Virtual Visit via Telephone Note  I connected with Marisa Sherman on 10/13/21 at  4:00 PM EDT by telephone and verified that I am speaking with the correct person using two identifiers.  Location: Patient: Home Provider: Clinic   I discussed the limitations, risks, security and privacy concerns of performing an evaluation and management service by telephone and the availability of in person appointments. I also discussed with the patient that there may be a patient responsible charge related to this service. The patient expressed understanding and agreed to proceed.  Follow Up Instructions:  I discussed the assessment and treatment plan with the patient. The patient was provided an opportunity to ask questions and all were answered. The patient agreed with the plan and demonstrated an understanding of the instructions.   The patient was advised to call back or seek an in-person evaluation if the symptoms worsen or if the condition fails to improve as anticipated.  I provided 9 minutes of non-face-to-face time during this encounter.  Meta HatchetUchenna E Ariyon Mittleman, PA    10/13/2021 10:46 PM Marisa Potteratsy [place]  MRN:  [id number]  Chief Complaint:  Chief Complaint  Patient presents with   Medication Management   Follow-up   HPI:   Marisa Potteratsy Balfour is a 61 year old female with a past psychiatric history significant for substance-induced mood disorder, anxiety, insomnia, and PTSD who [facility] via virtual telephone visit for follow-up and medication management.  Patient is currently being managed on the following medications:  Trazodone 200 mg at bedtime Fluoxetine 40 mg daily Seroquel XR 150 mg 24-hour tablet at bedtime Hydroxyzine 50 mg every 8 hours as needed  Patient reports no issues or concerns regarding her current medication regimen.  Patient denies the need for dosage adjustments at this time and is requesting refills  on her medications following the conclusion of the encounter.  Patient denies depressive symptoms or anxiety.  She further denies any new stressors at this time.  A GAD-7 screen was performed with the patient scoring a 5.  Patient is alert and oriented x4, pleasant, calm, cooperative, and fully engaged in conversation during the encounter.  Patient endorses good mood.  Patient denies suicidal or homicidal ideations.  She further denies auditory or visual hallucinations and does not appear to be responding to internal/external stimuli.  Patient endorses poor sleep stating that she wakes up every 2 hours.  Patient endorses good appetite and eats on average 2 meals per day.  Patient denies alcohol consumption, tobacco use, and illicit drug use.  Visit Diagnosis:    ICD-10-CM   1. Insomnia, unspecified type  G47.00 Tasimelteon 20 MG CAPS    2. PTSD (post-traumatic stress disorder)  F43.10 FLUoxetine (PROZAC) 40 MG capsule    3. Substance induced mood disorder (HCC)  F19.94 QUEtiapine Fumarate (SEROQUEL XR) 150 MG 24 hr tablet    FLUoxetine (PROZAC) 40 MG capsule    4. Cocaine use disorder, severe, in early remission (HCC)  F14.21     5. Anxiety  F41.9 QUEtiapine Fumarate (SEROQUEL XR) 150 MG 24 hr tablet    hydrOXYzine (VISTARIL) 50 MG capsule      Past Psychiatric History:  Bipolar d/o, substance induced mood disorder, PTSD, anxiety. Was bring seen at [facility] [place] for past 4-5 years  Past Medical History:  Past Medical History:  Diagnosis Date   Abnormal Pap smear of cervix    Allergy    Anxiety  Bipolar disorder (HCC)    Cataract    Complication of anesthesia    Degenerative disc disease at L5-S1 level    Depression    GERD (gastroesophageal reflux disease)    Gout    Hyperlipidemia    Hypertension    Overactive bladder    PONV (postoperative nausea and vomiting)     Past Surgical History:  Procedure Laterality Date   ANTERIOR CERVICAL  DECOMP/DISCECTOMY FUSION N/A 03/05/2021   Procedure: ACDF - C5-C6 - C6-C7;  Surgeon: Julio Sicks, MD;  Location: [facility];  Service: Neurosurgery;  Laterality: N/A;  3C   CARPAL TUNNEL RELEASE Bilateral    CERVIX LESION DESTRUCTION  1985   CHOLECYSTECTOMY     DILATION AND CURETTAGE OF UTERUS     EYE SURGERY Bilateral    cataract removal   TONSILLECTOMY     TUBAL LIGATION      Family Psychiatric History:  History of depression, anxiety in mother.  She also informed history of anxiety and depression in her siblings.  Family History:  Family History  Problem Relation Age of Onset   Depression Mother    Anxiety disorder Mother    Asthma Mother    Bipolar disorder Father    Depression Sister    Anxiety disorder Sister    Depression Brother    Anxiety disorder Brother    Depression Brother    Anxiety disorder Brother    Cancer Neg Hx    Diabetes Neg Hx    Heart disease Neg Hx    Colon cancer Neg Hx    Breast cancer Neg Hx    Ovarian cancer Neg Hx    Cervical cancer Neg Hx     Social History:  Social History   Socioeconomic History   Marital status: Divorced    Spouse name: Not on file   Number of children: 3   Years of education: Not on file   Highest education level: GED or equivalent  Occupational History   Occupation: Disablilty  Tobacco Use   Smoking status: Every Day    Packs/day: 1.00    Years: 40.00    Total pack years: 40.00    Types: Cigarettes   Smokeless tobacco: Never   Tobacco comments:    started smoking at age 70; has quit on and off throughout the years. Has decreased cig use to 0.5 PPD  Vaping Use   Vaping Use: Never used  Substance and Sexual Activity   Alcohol use: Not Currently   Drug use: Not Currently    Comment: 90 days recovery   Sexual activity: Not Currently  Other Topics Concern   Not on file  Social History Narrative   Not on file   Social Determinants of Health   Financial Resource Strain: Low Risk  (07/08/2020)   Overall  Financial Resource Strain (CARDIA)    Difficulty of Paying Living Expenses: Not hard at all  Food Insecurity: No Food Insecurity (08/25/2020)   Hunger Vital Sign    Worried About Running Out of Food in the Last Year: Never true    Ran Out of Food in the Last Year: Never true  Transportation Needs: No Transportation Needs (08/25/2020)   PRAPARE - Administrator, Civil Service (Medical): No    Lack of Transportation (Non-Medical): No  Physical Activity: Sufficiently Active (07/08/2020)   Exercise Vital Sign    Days of Exercise per Week: 7 days    Minutes of Exercise per Session: 30 min  Stress: Stress Concern Present (07/08/2020)   [employer] of Occupational Health - Occupational Stress Questionnaire    Feeling of Stress : Rather much  Social Connections: Moderately Isolated (07/08/2020)   Social Connection and Isolation Panel [NHANES]    Frequency of Communication with Friends and Family: Twice a week    Frequency of Social Gatherings with Friends and Family: Never    Attends Religious Services: 1 to 4 times per year    Active Member of [employer] or Organizations: Yes    Attends Engineer, structural: More than 4 times per year    Marital Status: Divorced    Allergies:  Allergies  Allergen Reactions   Codeine Nausea Only   Diphenhydramine Hcl Other (See Comments)    Pt unsure why   Lithium Itching   Sulfa Antibiotics Nausea And Vomiting   Amoxicillin Nausea And Vomiting    Metabolic Disorder Labs: No results found for: "HGBA1C", "MPG" No results found for: "PROLACTIN" Lab Results  Component Value Date   CHOL 165 04/13/2020   TRIG 147 04/13/2020   HDL 41 04/13/2020   CHOLHDL 4.0 04/13/2020   LDLCALC 98 04/13/2020   LDLCALC 90 04/01/2019   Lab Results  Component Value Date   TSH 2.610 04/13/2020   TSH 1.720 07/05/2013    Therapeutic Level Labs: Lab Results  Component Value Date   LITHIUM 0.50 (L) 07/10/2013   LITHIUM <0.25 (L) 07/01/2013   No  results found for: "VALPROATE" Lab Results  Component Value Date   CBMZ 7.6 07/10/2013    Current Medications: Current Outpatient Medications  Medication Sig Dispense Refill   Tasimelteon 20 MG CAPS Take 20 mg by mouth at bedtime. 30 capsule 1   acetaminophen (TYLENOL) 500 MG tablet Take 1,000 mg by mouth every 6 (six) hours as needed for mild pain.     allopurinol (ZYLOPRIM) 100 MG tablet TAKE 1 TABLET BY MOUTH DAILY 90 tablet 0   aspirin EC 81 MG tablet Take 81 mg by mouth every evening.     atorvastatin (LIPITOR) 40 MG tablet TAKE 1 TABLET BY MOUTH DAILY AT  6 PM 90 tablet 0   FLUoxetine (PROZAC) 40 MG capsule Take 1 capsule (40 mg total) by mouth every morning. 30 capsule 3   hydrOXYzine (VISTARIL) 50 MG capsule TAKE 1 CAPSULE BY MOUTH EVERY 8  HOURS AS NEEDED FOR ANXIETY 180 capsule 1   lisinopril (ZESTRIL) 40 MG tablet TAKE 1 TABLET BY MOUTH DAILY 100 tablet 0   pantoprazole (PROTONIX) 40 MG tablet Take 1 tablet (40 mg total) by mouth daily. (Patient taking differently: Take 40 mg by mouth every evening.) 90 tablet 3   QUEtiapine Fumarate (SEROQUEL XR) 150 MG 24 hr tablet Take 1 tablet (150 mg total) by mouth at bedtime. 30 tablet 3   sucralfate (CARAFATE) 1 g tablet Take 1 g by mouth 3 (three) times daily.     traZODone (DESYREL) 100 MG tablet Take 2 tablets (200 mg total) by mouth at bedtime. 60 tablet 2   No current facility-administered medications for this visit.     Musculoskeletal: Strength & Muscle Tone: Unable to assess due to telemedicine visit Gait & Station: Unable to assess due to telemedicine visit Patient leans: Unable to assess due to telemedicine visit  Psychiatric Specialty Exam: Review of Systems  Psychiatric/Behavioral:  Positive for sleep disturbance. Negative for decreased concentration, dysphoric mood, hallucinations, self-injury and suicidal ideas. The patient is not nervous/anxious and is not hyperactive.  There were no vitals taken for this  visit.There is no height or weight on file to calculate BMI.  General Appearance: Unable to assess due to telemedicine visit  Eye Contact:  Unable to assess due to telemedicine visit  Speech:  Clear and Coherent and Normal Rate  Volume:  Normal  Mood:  Euthymic  Affect:  Appropriate  Thought Process:  Coherent and Descriptions of Associations: Intact  Orientation:  Full (Time, Place, and Person)  Thought Content: WDL   Suicidal Thoughts:  No  Homicidal Thoughts:  No  Memory:  Immediate;   Good Recent;   Good Remote;   Good  Judgement:  Good  Insight:  Good  Psychomotor Activity:  Normal  Concentration:  Concentration: Good and Attention Span: Good  Recall:  Good  Fund of Knowledge: Good  Language: Good  Akathisia:  No  Handed:  Right  AIMS (if indicated): not done  Assets:  Communication Skills Desire for Improvement Housing Social Support Vocational/Educational  ADL's:  Intact  Cognition: WNL  Sleep:  Poor   Screenings: AUDIT    Flowsheet Row Admission (Discharged) from 07/02/2013 in [place] [facility]  Alcohol Use Disorder Identification Test Final Score (AUDIT) 0      GAD-7    Flowsheet Row Video Visit from 10/13/2021 in [facility] Video Visit from 03/10/2021 in [facility] Video Visit from 01/06/2021 in [facility] Video Visit from 11/06/2020 in [facility] Office Visit from 07/30/2019 in [place]  Total GAD-7 Score 5 2 9 13 19       PHQ2-9    Flowsheet Row Video Visit from 10/13/2021 in [facility] Office Visit from 07/01/2021 in [place] [facility] [place] from 06/09/2021 in [place] [facility] [place] from 03/10/2021 in [facility] Office Visit from 02/01/2021 in [place] at [place]  PHQ-2 Total Score 1 0 0 0  0  PHQ-9 Total Score -- 6 4 -- 6      Flowsheet Row Video Visit from 10/13/2021 in [facility] Video Visit from 03/10/2021 in [facility] Admission (Discharged) from 03/05/2021 in [place] No Risk No Risk        Assessment and Plan:   Ramiah Helfrich is a 61 year old female with a past psychiatric history significant for substance-induced mood disorder, anxiety, insomnia, and PTSD who presents to [facility] via virtual telephone visit for follow-up and medication management.  Patient reports no issues or concerns regarding her medication regimen.  She does report that she has been having issues with her sleep and only sleeps 1 to 2 hours at a time.  Provider recommended patient be placed on tasimelteon 20 mg at bedtime for management of her sleep.  Patient was agreeable to recommendation.  Patient's medication to be e-prescribed to pharmacy of choice.  Collaboration of Care: Collaboration of Care: Medication Management AEB provider managing patient's psychiatric medications and Psychiatrist AEB patient being followed by a mental health provider  Patient/Guardian was advised Release of Information must be obtained prior to any record release in order to collaborate their care with an outside provider. Patient/Guardian was advised if they have not already done so to contact the registration department to sign all necessary forms in order for [place] to release information  regarding their care.   Consent: Patient/Guardian gives verbal consent for treatment and assignment of benefits for services provided during this visit. Patient/Guardian expressed understanding and agreed to proceed.   1. Insomnia, unspecified type  - Tasimelteon 20 MG CAPS; Take 20 mg by mouth at bedtime.  Dispense: 30 capsule; Refill: 1  2. PTSD (post-traumatic stress  disorder)  - FLUoxetine (PROZAC) 40 MG capsule; Take 1 capsule (40 mg total) by mouth every morning.  Dispense: 30 capsule; Refill: 3  3. Substance induced mood disorder (HCC)  - QUEtiapine Fumarate (SEROQUEL XR) 150 MG 24 hr tablet; Take 1 tablet (150 mg total) by mouth at bedtime.  Dispense: 30 tablet; Refill: 3 - FLUoxetine (PROZAC) 40 MG capsule; Take 1 capsule (40 mg total) by mouth every morning.  Dispense: 30 capsule; Refill: 3  4. Cocaine use disorder, severe, in early remission (HCC)   5. Anxiety  - QUEtiapine Fumarate (SEROQUEL XR) 150 MG 24 hr tablet; Take 1 tablet (150 mg total) by mouth at bedtime.  Dispense: 30 tablet; Refill: 3 - hydrOXYzine (VISTARIL) 50 MG capsule; TAKE 1 CAPSULE BY MOUTH EVERY 8  HOURS AS NEEDED FOR ANXIETY  Dispense: 180 capsule; Refill: 1  Patient to follow up in 7 weeks Provider spent a total 9 minutes with the patient/reviewing patient's chart  Meta Hatchet, PA 10/13/2021, 10:46 PM

## 2021-10-14 ENCOUNTER — Telehealth (HOSPITAL_COMMUNITY): Payer: Self-pay | Admitting: *Deleted

## 2021-10-14 ENCOUNTER — Encounter: Payer: Self-pay | Admitting: Neurology

## 2021-10-14 ENCOUNTER — Ambulatory Visit: Payer: 59 | Admitting: Neurology

## 2021-10-14 NOTE — Telephone Encounter (Signed)
Message acknowledged and reviewed, patient was seen yesterday and placed on tasimelteon for the management of her sleep.  Provider will hold off on refilling patient's trazodone.

## 2021-10-14 NOTE — Telephone Encounter (Signed)
PA submitted for patients Hetloiz for patients sleep. Waiting on the determination, patient has medicare. [id number]

## 2021-10-14 NOTE — Telephone Encounter (Signed)
Received a denial for the Hetlioz, states the PA did not meet medical necessity. Will notify his provider it did not get approved.

## 2021-10-15 ENCOUNTER — Other Ambulatory Visit (HOSPITAL_COMMUNITY): Payer: Self-pay | Admitting: Physician Assistant

## 2021-10-15 DIAGNOSIS — G4700 Insomnia, unspecified: Secondary | ICD-10-CM

## 2021-10-15 MED ORDER — TRAZODONE HCL 300 MG PO TABS: 300.0000 mg | ORAL_TABLET | Freq: Every day | ORAL | 2 refills | Status: DC

## 2021-10-15 NOTE — Telephone Encounter (Signed)
Provider was contacted by Suzanne K Beck, RN regarding patient not being able to obtain tasimelteon medication due to cost.  Provider to represcribe patient's trazodone.  Patient's trazodone to be increased from 200 mg to 300 mg at bedtime for the management of her sleep.  Patient's medication to be e-prescribed to pharmacy of choice. 

## 2021-10-15 NOTE — Progress Notes (Signed)
Provider was contacted by Davina Poke, RN regarding patient not being able to obtain tasimelteon medication due to cost.  Provider to represcribe patient's trazodone.  Patient's trazodone to be increased from 200 mg to 300 mg at bedtime for the management of her sleep.  Patient's medication to be e-prescribed to pharmacy of choice.

## 2021-10-18 ENCOUNTER — Telehealth: Payer: Self-pay

## 2021-10-18 ENCOUNTER — Telehealth (HOSPITAL_COMMUNITY): Payer: Self-pay | Admitting: *Deleted

## 2021-10-18 NOTE — Telephone Encounter (Signed)
Ok to dismiss

## 2021-10-18 NOTE — Telephone Encounter (Signed)
Patient has no showed two new patient appointments. Per office policy, patient may be dismissed. How would you like [place] to proceed with this patient Dr Krista Blue?

## 2021-10-18 NOTE — Telephone Encounter (Signed)
[facility] called to see if were going appeal the decision --Received a denial for the Hetlioz, states the PA did not meet medical necessity

## 2021-10-19 ENCOUNTER — Encounter: Payer: Self-pay | Admitting: Neurology

## 2021-10-19 ENCOUNTER — Telehealth (HOSPITAL_COMMUNITY): Payer: Self-pay | Admitting: *Deleted

## 2021-10-19 NOTE — Telephone Encounter (Signed)
Patient called to f/u with provider Eddie after hearing that  --- [facility] called to see if were going appeal the decision --Received a denial for the Hetlioz, states the PA did not meet medical necessity.  Patient wants to know what else/other options is there??

## 2021-10-21 ENCOUNTER — Other Ambulatory Visit: Payer: Self-pay | Admitting: Internal Medicine

## 2021-10-27 ENCOUNTER — Other Ambulatory Visit: Payer: Self-pay | Admitting: Internal Medicine

## 2021-10-29 ENCOUNTER — Other Ambulatory Visit (HOSPITAL_COMMUNITY): Payer: Self-pay | Admitting: Physician Assistant

## 2021-10-29 DIAGNOSIS — G4700 Insomnia, unspecified: Secondary | ICD-10-CM

## 2021-11-06 ENCOUNTER — Other Ambulatory Visit: Payer: Self-pay | Admitting: Internal Medicine

## 2021-11-06 DIAGNOSIS — I10 Essential (primary) hypertension: Secondary | ICD-10-CM

## 2021-11-15 ENCOUNTER — Ambulatory Visit
Admission: RE | Admit: 2021-11-15 | Discharge: 2021-11-15 | Disposition: A | Discharge status: Home / Self Care | Payer: Medicare Other | Source: Ambulatory Visit | Attending: Internal Medicine | Admitting: Internal Medicine

## 2021-11-15 ENCOUNTER — Encounter: Payer: Medicare Other | Admitting: Internal Medicine

## 2021-11-15 DIAGNOSIS — R2232 Localized swelling, mass and lump, left upper limb: Secondary | ICD-10-CM

## 2021-11-15 DIAGNOSIS — N6489 Other specified disorders of breast: Secondary | ICD-10-CM | POA: Diagnosis not present

## 2021-11-15 DIAGNOSIS — R92323 Mammographic fibroglandular density, bilateral breasts: Secondary | ICD-10-CM | POA: Diagnosis not present

## 2021-11-17 ENCOUNTER — Encounter: Payer: Medicare Other | Admitting: Internal Medicine

## 2021-11-23 ENCOUNTER — Other Ambulatory Visit (HOSPITAL_COMMUNITY): Payer: Self-pay | Admitting: Physician Assistant

## 2021-11-23 DIAGNOSIS — F1994 Other psychoactive substance use, unspecified with psychoactive substance-induced mood disorder: Secondary | ICD-10-CM

## 2021-11-23 DIAGNOSIS — F419 Anxiety disorder, unspecified: Secondary | ICD-10-CM

## 2021-11-23 DIAGNOSIS — F4310 Post-traumatic stress disorder, unspecified: Secondary | ICD-10-CM

## 2021-11-24 ENCOUNTER — Ambulatory Visit (INDEPENDENT_AMBULATORY_CARE_PROVIDER_SITE_OTHER): Payer: Medicare Other | Admitting: Internal Medicine

## 2021-11-24 ENCOUNTER — Encounter: Payer: Self-pay | Admitting: Internal Medicine

## 2021-11-24 VITALS — BP 118/79 | HR 75 | Temp 98.2°F | Ht 65.0 in | Wt 189.9 lb

## 2021-11-24 DIAGNOSIS — G4733 Obstructive sleep apnea (adult) (pediatric): Secondary | ICD-10-CM | POA: Diagnosis not present

## 2021-11-24 DIAGNOSIS — Z124 Encounter for screening for malignant neoplasm of cervix: Secondary | ICD-10-CM

## 2021-11-24 DIAGNOSIS — Z23 Encounter for immunization: Secondary | ICD-10-CM | POA: Diagnosis not present

## 2021-11-24 DIAGNOSIS — Z0000 Encounter for general adult medical examination without abnormal findings: Secondary | ICD-10-CM

## 2021-11-24 DIAGNOSIS — H539 Unspecified visual disturbance: Secondary | ICD-10-CM

## 2021-11-24 DIAGNOSIS — K219 Gastro-esophageal reflux disease without esophagitis: Secondary | ICD-10-CM

## 2021-11-24 DIAGNOSIS — F17210 Nicotine dependence, cigarettes, uncomplicated: Secondary | ICD-10-CM

## 2021-11-24 DIAGNOSIS — Z1211 Encounter for screening for malignant neoplasm of colon: Secondary | ICD-10-CM

## 2021-11-24 DIAGNOSIS — M1A9XX0 Chronic gout, unspecified, without tophus (tophi): Secondary | ICD-10-CM

## 2021-11-24 DIAGNOSIS — E782 Mixed hyperlipidemia: Secondary | ICD-10-CM | POA: Diagnosis not present

## 2021-11-24 DIAGNOSIS — I10 Essential (primary) hypertension: Secondary | ICD-10-CM

## 2021-11-24 DIAGNOSIS — F319 Bipolar disorder, unspecified: Secondary | ICD-10-CM

## 2021-11-24 LAB — CBC WITH DIFFERENTIAL/PLATELET
Basophils Absolute: 0 10*3/uL (ref 0.0–0.1)
Basophils Relative: 0.4 % (ref 0.0–3.0)
Eosinophils Absolute: 0.1 10*3/uL (ref 0.0–0.7)
Eosinophils Relative: 1 % (ref 0.0–5.0)
HCT: 40.8 % (ref 36.0–46.0)
Hemoglobin: 13.5 g/dL (ref 12.0–15.0)
Lymphocytes Relative: 36.3 % (ref 12.0–46.0)
Lymphs Abs: 3.5 10*3/uL (ref 0.7–4.0)
MCHC: 33 g/dL (ref 30.0–36.0)
MCV: 79.4 fl (ref 78.0–100.0)
Monocytes Absolute: 0.6 10*3/uL (ref 0.1–1.0)
Monocytes Relative: 5.8 % (ref 3.0–12.0)
Neutro Abs: 5.5 10*3/uL (ref 1.4–7.7)
Neutrophils Relative %: 56.5 % (ref 43.0–77.0)
Platelets: 280 10*3/uL (ref 150.0–400.0)
RBC: 5.13 Mil/uL — ABNORMAL HIGH (ref 3.87–5.11)
RDW: 14.4 % (ref 11.5–15.5)
WBC: 9.8 10*3/uL (ref 4.0–10.5)

## 2021-11-24 LAB — COMPREHENSIVE METABOLIC PANEL
ALT: 17 U/L (ref 0–35)
AST: 14 U/L (ref 0–37)
Albumin: 4.5 g/dL (ref 3.5–5.2)
Alkaline Phosphatase: 79 U/L (ref 39–117)
BUN: 10 mg/dL (ref 6–23)
CO2: 32 mEq/L (ref 19–32)
Calcium: 10.1 mg/dL (ref 8.4–10.5)
Chloride: 99 mEq/L (ref 96–112)
Creatinine, Ser: 0.91 mg/dL (ref 0.40–1.20)
GFR: 67.18 mL/min (ref >60.00)
Glucose, Bld: 100 mg/dL — ABNORMAL HIGH (ref 70–99)
Potassium: 4.2 mEq/L (ref 3.5–5.1)
Sodium: 138 mEq/L (ref 135–145)
Total Bilirubin: 0.4 mg/dL (ref 0.2–1.2)
Total Protein: 7.7 g/dL (ref 6.0–8.3)

## 2021-11-24 LAB — LIPID PANEL
Cholesterol: 172 mg/dL (ref 0–200)
HDL: 39.4 mg/dL (ref >39.00)
NonHDL: 132.51
Total CHOL/HDL Ratio: 4
Triglycerides: 201 mg/dL — ABNORMAL HIGH (ref 0.0–149.0)
VLDL: 40.2 mg/dL — ABNORMAL HIGH (ref 0.0–40.0)

## 2021-11-24 LAB — LDL CHOLESTEROL, DIRECT: Direct LDL: 110 mg/dL

## 2021-11-24 LAB — HEMOGLOBIN A1C: Hgb A1c MFr Bld: 6.2 % (ref 4.6–6.5)

## 2021-11-24 MED ORDER — BUPROPION HCL ER (XL) 150 MG PO TB24: 150.0000 mg | ORAL_TABLET | Freq: Every day | ORAL | 1 refills | Status: DC

## 2021-11-24 NOTE — Progress Notes (Signed)
Established Patient Office Visit     CC/Reason for Visit: Annual preventive exam, subsequent Medicare wellness visit, discuss acute concerns  HPI: Marisa Sherman is a 61 y.o. female who is coming in today for the above mentioned reasons. Past Medical History is significant for: Hypertension, hyperlipidemia, GERD, gout, peripheral neuropathy, tobacco use, bipolar 1 disorder followed by psychiatry.  She last smoked 3 days ago.  She has been using patches.  She states that Wellbutrin was helpful for her in the past and is requesting a prescription.  She is overdue for an eye exam.  She no longer sees a dentist as she has complete dentures.  No perceived hearing difficulty, does not exercise routinely.  She is overdue for flu, COVID, pneumonia vaccines.  She is overdue for cervical, colon, lung cancer screening.  She is concerned about her sleep.  She has been told that she snores.  She has been waking up 6-8 times a night.  She feels very fatigued and tired throughout the day.   Past Medical/Surgical History: Past Medical History:  Diagnosis Date   Abnormal Pap smear of cervix    Allergy    Anxiety    Bipolar disorder (HCC)    Cataract    Complication of anesthesia    Degenerative disc disease at L5-S1 level    Depression    GERD (gastroesophageal reflux disease)    Gout    Hyperlipidemia    Hypertension    Overactive bladder    PONV (postoperative nausea and vomiting)     Past Surgical History:  Procedure Laterality Date   ANTERIOR CERVICAL DECOMP/DISCECTOMY FUSION N/A 03/05/2021   Procedure: ACDF - C5-C6 - C6-C7;  Surgeon: Julio Sicks, MD;  Location: [facility] OR;  Service: Neurosurgery;  Laterality: N/A;  3C   CARPAL TUNNEL RELEASE Bilateral    CERVIX LESION DESTRUCTION  1985   CHOLECYSTECTOMY     DILATION AND CURETTAGE OF UTERUS     EYE SURGERY Bilateral    cataract removal   TONSILLECTOMY     TUBAL LIGATION      Social History:  reports that she has been smoking  cigarettes. She has a 40.00 pack-year smoking history. She has never used smokeless tobacco. She reports that she does not currently use alcohol. She reports that she does not currently use drugs.  Allergies: Allergies  Allergen Reactions   Codeine Nausea Only   Diphenhydramine Hcl Other (See Comments)    Pt unsure why   Lithium Itching   Sulfa Antibiotics Nausea And Vomiting   Amoxicillin Nausea And Vomiting    Family History:  Family History  Problem Relation Age of Onset   Depression Mother    Anxiety disorder Mother    Asthma Mother    Bipolar disorder Father    Depression Sister    Anxiety disorder Sister    Depression Brother    Anxiety disorder Brother    Depression Brother    Anxiety disorder Brother    Cancer Neg Hx    Diabetes Neg Hx    Heart disease Neg Hx    Colon cancer Neg Hx    Breast cancer Neg Hx    Ovarian cancer Neg Hx    Cervical cancer Neg Hx      Current Outpatient Medications:    acetaminophen (TYLENOL) 500 MG tablet, Take 1,000 mg by mouth every 6 (six) hours as needed for mild pain., Disp: , Rfl:    allopurinol (ZYLOPRIM) 100 MG tablet, TAKE 1  TABLET BY MOUTH DAILY, Disp: 100 tablet, Rfl: 0   aspirin EC 81 MG tablet, Take 81 mg by mouth every evening., Disp: , Rfl:    atorvastatin (LIPITOR) 40 MG tablet, TAKE 1 TABLET BY MOUTH DAILY AT  6 PM, Disp: 100 tablet, Rfl: 0   buPROPion (WELLBUTRIN XL) 150 MG 24 hr tablet, Take 1 tablet (150 mg total) by mouth daily., Disp: 90 tablet, Rfl: 1   FLUoxetine (PROZAC) 40 MG capsule, Take 1 capsule (40 mg total) by mouth every morning., Disp: 30 capsule, Rfl: 3   hydrOXYzine (VISTARIL) 50 MG capsule, TAKE 1 CAPSULE BY MOUTH EVERY 8  HOURS AS NEEDED FOR ANXIETY, Disp: 180 capsule, Rfl: 1   lisinopril (ZESTRIL) 40 MG tablet, TAKE 1 TABLET BY MOUTH DAILY, Disp: 100 tablet, Rfl: 1   pantoprazole (PROTONIX) 40 MG tablet, Take 1 tablet (40 mg total) by mouth daily. (Patient taking differently: Take 40 mg by mouth  every evening.), Disp: 90 tablet, Rfl: 3   QUEtiapine Fumarate (SEROQUEL XR) 150 MG 24 hr tablet, Take 1 tablet (150 mg total) by mouth at bedtime., Disp: 30 tablet, Rfl: 3   sucralfate (CARAFATE) 1 g tablet, Take 1 g by mouth 3 (three) times daily., Disp: , Rfl:    Tasimelteon 20 MG CAPS, Take 20 mg by mouth at bedtime., Disp: 30 capsule, Rfl: 1   trazodone (DESYREL) 300 MG tablet, TAKE 1 TABLET BY MOUTH AT  BEDTIME, Disp: 30 tablet, Rfl: 11  Review of Systems:  Constitutional: Denies fever, chills, diaphoresis, appetite change. HEENT: Denies photophobia, eye pain, redness, hearing loss, ear pain, congestion, sore throat, rhinorrhea, sneezing, mouth sores, trouble swallowing, neck pain, neck stiffness and tinnitus.   Respiratory: Denies SOB, DOE, cough, chest tightness,  and wheezing.   Cardiovascular: Denies chest pain, palpitations and leg swelling.  Gastrointestinal: Denies nausea, vomiting, abdominal pain, diarrhea, constipation, blood in stool and abdominal distention.  Genitourinary: Denies dysuria, urgency, frequency, hematuria, flank pain and difficulty urinating.  Endocrine: Denies: hot or cold intolerance, sweats, changes in hair or nails, polyuria, polydipsia. Musculoskeletal: Denies myalgias, back pain, joint swelling, arthralgias and gait problem.  Skin: Denies pallor, rash and wound.  Neurological: Denies dizziness, seizures, syncope, weakness, light-headedness, numbness and headaches.  Hematological: Denies adenopathy. Easy bruising, personal or family bleeding history  Psychiatric/Behavioral: Denies suicidal ideation, mood changes, confusion, nervousness, sleep disturbance and agitation    Physical Exam: Vitals:   11/24/21 1305 11/24/21 1330  BP: 118/80 118/79  Pulse: 75   Temp: 98.2 F (36.8 C)   TempSrc: Oral   SpO2: 99%   Weight: 189 lb 14.4 oz (86.1 kg)   Height: 5\' 5"  (1.651 m)     Body mass index is 31.6 kg/m.   Constitutional: NAD, calm,  comfortable Eyes: PERRL, lids and conjunctivae normal ENMT: Mucous membranes are moist. Posterior pharynx clear of any exudate or lesions. Normal dentition. Tympanic membrane is pearly white, no erythema or bulging. Neck: normal, supple, no masses, no thyromegaly Respiratory: clear to auscultation bilaterally, no wheezing, no crackles. Normal respiratory effort. No accessory muscle use.  Cardiovascular: Regular rate and rhythm, no murmurs / rubs / gallops. No extremity edema. 2+ pedal pulses. No carotid bruits.  Abdomen: no tenderness, no masses palpated. No hepatosplenomegaly. Bowel sounds positive.  Musculoskeletal: no clubbing / cyanosis. No joint deformity upper and lower extremities. Good ROM, no contractures. Normal muscle tone.  Skin: no rashes, lesions, ulcers. No induration Neurologic: CN 2-12 grossly intact. Sensation intact, DTR normal. Strength 5/5  in all 4.  Psychiatric: Normal judgment and insight. Alert and oriented x 3. Normal mood.    Subsequent Medicare wellness visit   1. Risk factors, based on past  M,S,F -cardiovascular disease risk factors include age, history of hypertension hyperlipidemia, ongoing tobacco use   2.  Physical activities: Very sedentary   3.  Depression/mood: History of bipolar disorder, mood appears stable   4.  Hearing: No perceived issues   5.  ADL's: Independent in all ADLs   6.  Fall risk: Low fall risk   7.  Home safety: No problems identified   8.  Height weight, and visual acuity: height and weight as above, vision:  Vision Screening   Right eye Left eye Both eyes  Without correction 20/30 20/30 20/30   With correction        9.  Counseling: Advised to update all age-appropriate vaccinations and cancer screenings   10. Lab orders based on risk factors: Laboratory update will be reviewed   11. Referral : None today   12. Care plan: Follow-up with me in 6 months   13. Cognitive assessment: No cognitive impairment   14.  Screening: Patient provided with a written and personalized 5-10 year screening schedule in the AVS. yes   15. Provider List Update: PCP, psychiatry  16. Advance Directives: Full code   17. Opioids: Patient is not on any opioid prescriptions and has no risk factors for a substance use disorder.   [place] Office Visit from 11/24/2021 in [place] at [facility]  PHQ-9 Total Score 6          03/05/2021    1:44 PM 03/05/2021    7:10 PM 06/09/2021    1:54 PM 07/01/2021    3:28 PM 11/24/2021    1:11 PM  [place] in the past year?   1 0 0  Was there an injury with Fall?   1 0 0  Fall Risk Category Calculator   3 0 0  Fall Risk Category   High Low Low  Patient Fall Risk Level Moderate fall risk Moderate fall risk High fall risk Low fall risk Low fall risk  Patient at Risk for Falls Due to   Impaired balance/gait;History of fall(s)  No Fall Risks  Fall risk Follow up   Falls evaluation completed  Falls evaluation completed     Impression and Plan:  Screening for malignant neoplasm of colon - Plan: Ambulatory referral to Gastroenterology  Encounter for preventive health examination  Need for influenza vaccination  Need for vaccination against Streptococcus pneumoniae  OSA (obstructive sleep apnea)  Cigarette nicotine dependence without complication - Plan: buPROPion (WELLBUTRIN XL) 150 MG 24 hr tablet, Ambulatory Referral Lung Cancer Screening [facility]  Essential hypertension - Plan: CBC with Differential/Platelet, Comprehensive metabolic panel  Chronic gout without tophus, unspecified cause, unspecified site  Bipolar 1 disorder, depressed ([place]) - Plan: Hemoglobin A1c  Gastroesophageal reflux disease, unspecified whether esophagitis present  Mixed hyperlipidemia - Plan: Lipid panel    -Recommend routine eye and dental care. -Immunizations: Flu and PCV 20 in office today, she will need to get COVID at pharmacy. -Healthy lifestyle discussed  in detail. -Labs to be updated today. -Colon cancer screening: Overdue, GI referral placed -Breast cancer screening: 10/2021 -Cervical cancer screening: Overdue, GYN referral placed -Lung cancer screening: Referred to lung cancer screening program -Prostate cancer screening: Not applicable -DEXA: Not applicable  -Agree with Wellbutrin for smoking cessation.  Prescription will be sent.  Have  encouraged her with her success thus far. -I agree that her symptoms of snoring, frequent nighttime awakenings and daytime somnolence and fatigue are suspicious for obstructive sleep apnea, I will refer for sleep study evaluation. -Referral for ophthalmology placed for screening.     Chaya Jan, MD [facility]

## 2021-11-24 NOTE — Addendum Note (Signed)
Addended by: Westley Hummer B on: 11/24/2021 01:42 PM   Modules accepted: Orders

## 2021-11-29 ENCOUNTER — Encounter: Payer: Self-pay | Admitting: Internal Medicine

## 2021-11-29 DIAGNOSIS — R7302 Impaired glucose tolerance (oral): Secondary | ICD-10-CM | POA: Insufficient documentation

## 2021-11-30 ENCOUNTER — Other Ambulatory Visit: Payer: Self-pay | Admitting: *Deleted

## 2021-11-30 DIAGNOSIS — R7303 Prediabetes: Secondary | ICD-10-CM

## 2021-11-30 MED ORDER — PANTOPRAZOLE SODIUM 40 MG PO TBEC: 40.0000 mg | DELAYED_RELEASE_TABLET | Freq: Every day | ORAL | 1 refills | Status: DC

## 2021-12-02 ENCOUNTER — Telehealth: Payer: Self-pay | Admitting: *Deleted

## 2021-12-02 DIAGNOSIS — G4730 Sleep apnea, unspecified: Secondary | ICD-10-CM

## 2021-12-02 NOTE — Telephone Encounter (Signed)
Please inform patient and send to pulmonary instead.  Left message on machine for patient to return our call

## 2021-12-09 NOTE — Telephone Encounter (Signed)
Left message on machine for patient to return our call 

## 2021-12-10 ENCOUNTER — Telehealth (HOSPITAL_COMMUNITY): Payer: Medicare Other | Admitting: Student in an Organized Health Care Education/Training Program

## 2021-12-14 ENCOUNTER — Encounter (HOSPITAL_COMMUNITY): Payer: Self-pay | Admitting: Student in an Organized Health Care Education/Training Program

## 2021-12-14 ENCOUNTER — Ambulatory Visit: Payer: Medicare Other | Admitting: Family Medicine

## 2021-12-14 ENCOUNTER — Telehealth (INDEPENDENT_AMBULATORY_CARE_PROVIDER_SITE_OTHER): Payer: Medicare Other | Admitting: Student in an Organized Health Care Education/Training Program

## 2021-12-14 DIAGNOSIS — G4700 Insomnia, unspecified: Secondary | ICD-10-CM

## 2021-12-14 DIAGNOSIS — F1994 Other psychoactive substance use, unspecified with psychoactive substance-induced mood disorder: Secondary | ICD-10-CM

## 2021-12-14 DIAGNOSIS — F419 Anxiety disorder, unspecified: Secondary | ICD-10-CM

## 2021-12-14 DIAGNOSIS — F4310 Post-traumatic stress disorder, unspecified: Secondary | ICD-10-CM | POA: Diagnosis not present

## 2021-12-14 MED ORDER — TRAZODONE HCL 300 MG PO TABS: 300.0000 mg | ORAL_TABLET | Freq: Every day | ORAL | 11 refills | Status: DC

## 2021-12-14 MED ORDER — QUETIAPINE FUMARATE ER 150 MG PO TB24: 150.0000 mg | ORAL_TABLET | Freq: Every day | ORAL | 11 refills | Status: DC

## 2021-12-14 MED ORDER — HYDROXYZINE PAMOATE 50 MG PO CAPS: ORAL_CAPSULE | ORAL | 1 refills | Status: DC

## 2021-12-14 MED ORDER — FLUOXETINE HCL 20 MG PO CAPS: 60.0000 mg | ORAL_CAPSULE | Freq: Every day | ORAL | 2 refills | Status: DC

## 2021-12-14 NOTE — Progress Notes (Signed)
[place] MD/PA/NP OP Progress Note  12/14/2021 7:03 PM Marisa Sherman  MRN:  [id number]  Chief Complaint:  Chief Complaint  Patient presents with   Follow-up   Virtual Visit via Telephone Note  I connected with Marisa Sherman on 12/14/21 at  4:00 PM EST by telephone and verified that I am speaking with the correct person using two identifiers.  Location: Patient: Home Provider: Office   I discussed the limitations, risks, security and privacy concerns of performing an evaluation and management service by telephone and the availability of in person appointments. I also discussed with the patient that there may be a patient responsible charge related to this service. The patient expressed understanding and agreed to proceed.   History of Present Illness: Marisa Sherman is a 61 year old female with a past psychiatric history significant for substance-induced mood disorder, anxiety, insomnia, and PTSD who presents to [facility] via virtual telephone visit for follow-up and medication management. Patient is currently being managed on the following medications:  Trazodone 300 mg at bedtime Fluoxetine 40 mg daily Seroquel XR 150 mg 24-hour tablet at bedtime Hydroxyzine 50 mg every 8 hours as needed  Patient reports that she was also recently started on Wellbutrin XL 150 mg for smoking cessation by her primary care provider.  Patient reports she has been on this medication for 2 weeks.  Patient reports that overall she feels her mood has been "pretty good" but endorses that she still has anxiety and is requiring hydroxyzine at least 3 times a day.  Patient denies having any panic attacks.  Patient reports that she is still suffering from PTSD endorsing she continues to have intrusive thoughts about her trauma as well as very rare occasional feelings as though "something bad will happen."  Patient reports that she is also doing better about her excessive spending,  endorsing that  she is living in a recovery house she has learned more life skills.  Patient reports she is now able to pay her bills and has cut back on her frivolous spending.  Patient reports that now when she has the urge to spend and she kept herself to $20.  In regards to patient's bipolar 1 diagnoses, the patient is not able to recall the exact manic episode, but patient does endorse a history of impulsive behavior as well as possible shopping addiction in the past.  Patient reports that she had previously racked up a $30,000 credit card bill and had ultimately filed for bankruptcy.  Patient denies that her purchases entirely on impulse, endorsing that she has been specific jewelries and things that she has been eying for some time.  Patient denies that she was under the influence of cocaine or other substances and she had this period of time.  Patient denies currently feeling as though something bad will happen if she does not spend her money.  Patient reports she can recall her father having gambling issues and spending so much money that they did not have enough to pay bills.  Patient endorses that she is very proud of herself for being able to pay her bills now.  Patient reports that she has been sober from cocaine and marijuana for 21 months.  Patient denies SI, HI and AVH.  Patient reports that she will be getting a sleep study soon for her insomnia endorsing that she is also noted she snores at night.       I discussed the assessment and treatment plan with the patient.  The patient was provided an opportunity to ask questions and all were answered. The patient agreed with the plan and demonstrated an understanding of the instructions.   The patient was advised to call back or seek an in-person evaluation if the symptoms worsen or if the condition fails to improve as anticipated.  I provided 25 minutes of non-face-to-face time during this encounter.   Freida Busman, MD  Visit  Diagnosis:    ICD-10-CM   1. PTSD (post-traumatic stress disorder)  F43.10 FLUoxetine (PROZAC) 20 MG capsule    2. Substance induced mood disorder (HCC)  F19.94 FLUoxetine (PROZAC) 20 MG capsule    QUEtiapine Fumarate (SEROQUEL XR) 150 MG 24 hr tablet    3. Anxiety  F41.9 FLUoxetine (PROZAC) 20 MG capsule    hydrOXYzine (VISTARIL) 50 MG capsule    QUEtiapine Fumarate (SEROQUEL XR) 150 MG 24 hr tablet    4. Insomnia, unspecified type  G47.00 trazodone (DESYREL) 300 MG tablet      Past Psychiatric History: Bipolar d/o, substance induced mood disorder, PTSD, anxiety. Was bring seen at [facility] in [place] for past 4-5 years   Past Medical History:  Past Medical History:  Diagnosis Date   Abnormal Pap smear of cervix    Allergy    Anxiety    Bipolar disorder ([place])    Cataract    Complication of anesthesia    Degenerative disc disease at L5-S1 level    Depression    GERD (gastroesophageal reflux disease)    Gout    Hyperlipidemia    Hypertension    Overactive bladder    PONV (postoperative nausea and vomiting)     Past Surgical History:  Procedure Laterality Date   ANTERIOR CERVICAL DECOMP/DISCECTOMY FUSION N/A 03/05/2021   Procedure: ACDF - C5-C6 - C6-C7;  Surgeon: Earnie Larsson, MD;  Location: [place] [facility];  Service: Neurosurgery;  Laterality: N/A;  3C   CARPAL TUNNEL RELEASE Bilateral    CERVIX LESION DESTRUCTION  1985   CHOLECYSTECTOMY     DILATION AND CURETTAGE OF UTERUS     EYE SURGERY Bilateral    cataract removal   TONSILLECTOMY     TUBAL LIGATION      Family Psychiatric History: History of depression, anxiety in mother.  She also informed history of anxiety and depression in her siblings.  Father had a gambling issue.  Family History:  Family History  Problem Relation Age of Onset   Depression Mother    Anxiety disorder Mother    Asthma Mother    Bipolar disorder Father    Depression Sister    Anxiety disorder Sister    Depression Brother     Anxiety disorder Brother    Depression Brother    Anxiety disorder Brother    Cancer Neg Hx    Diabetes Neg Hx    Heart disease Neg Hx    Colon cancer Neg Hx    Breast cancer Neg Hx    Ovarian cancer Neg Hx    Cervical cancer Neg Hx     Social History:  Social History   Socioeconomic History   Marital status: Divorced    Spouse name: Not on file   Number of children: 3   Years of education: Not on file   Highest education level: GED or equivalent  Occupational History   Occupation: Disablilty  Tobacco Use   Smoking status: Every Day    Packs/day: 1.00    Years: 40.00    Total pack years:  40.00    Types: Cigarettes   Smokeless tobacco: Never   Tobacco comments:    started smoking at age 34; has quit on and off throughout the years. Has decreased cig use to 0.5 PPD  Vaping Use   Vaping Use: Never used  Substance and Sexual Activity   Alcohol use: Not Currently   Drug use: Not Currently    Comment: 90 days recovery   Sexual activity: Not Currently  Other Topics Concern   Not on file  Social History Narrative   Not on file   Social Determinants of Health   Financial Resource Strain: Low Risk  (07/08/2020)   Overall Financial Resource Strain (CARDIA)    Difficulty of Paying Living Expenses: Not hard at all  Food Insecurity: No Food Insecurity (08/25/2020)   Hunger Vital Sign    Worried About Running Out of Food in the Last Year: Never true    Ran Out of Food in the Last Year: Never true  Transportation Needs: No Transportation Needs (08/25/2020)   PRAPARE - Hydrologist (Medical): No    Lack of Transportation (Non-Medical): No  Physical Activity: Sufficiently Active (07/08/2020)   Exercise Vital Sign    Days of Exercise per Week: 7 days    Minutes of Exercise per Session: 30 min  Stress: Stress Concern Present (07/08/2020)   [place]    Feeling of Stress : Rather much   Social Connections: Moderately Isolated (07/08/2020)   Social Connection and Isolation Panel [NHANES]    Frequency of Communication with Friends and Family: Twice a week    Frequency of Social Gatherings with Friends and Family: Never    Attends Religious Services: 1 to 4 times per year    Active Member of [employer] or Organizations: Yes    Attends Music therapist: More than 4 times per year    Marital Status: Divorced    Allergies:  Allergies  Allergen Reactions   Codeine Nausea Only   Diphenhydramine Hcl Other (See Comments)    Pt unsure why   Lithium Itching   Sulfa Antibiotics Nausea And Vomiting   Amoxicillin Nausea And Vomiting    Metabolic Disorder Labs: Lab Results  Component Value Date   HGBA1C 6.2 11/24/2021   No results found for: "PROLACTIN" Lab Results  Component Value Date   CHOL 172 11/24/2021   TRIG 201.0 (H) 11/24/2021   HDL 39.40 11/24/2021   CHOLHDL 4 11/24/2021   VLDL 40.2 (H) 11/24/2021   LDLCALC 98 04/13/2020   LDLCALC 90 04/01/2019   Lab Results  Component Value Date   TSH 2.610 04/13/2020   TSH 1.720 07/05/2013    Therapeutic Level Labs: Lab Results  Component Value Date   LITHIUM 0.50 (L) 07/10/2013   LITHIUM <0.25 (L) 07/01/2013   No results found for: "VALPROATE" Lab Results  Component Value Date   CBMZ 7.6 07/10/2013    Current Medications: Current Outpatient Medications  Medication Sig Dispense Refill   FLUoxetine (PROZAC) 20 MG capsule Take 3 capsules (60 mg total) by mouth daily. 90 capsule 2   acetaminophen (TYLENOL) 500 MG tablet Take 1,000 mg by mouth every 6 (six) hours as needed for mild pain.     allopurinol (ZYLOPRIM) 100 MG tablet TAKE 1 TABLET BY MOUTH DAILY 100 tablet 0   aspirin EC 81 MG tablet Take 81 mg by mouth every evening.     atorvastatin (LIPITOR) 40 MG  tablet TAKE 1 TABLET BY MOUTH DAILY AT  6 PM 100 tablet 0   buPROPion (WELLBUTRIN XL) 150 MG 24 hr tablet Take 1 tablet (150 mg total) by mouth  daily. 90 tablet 1   hydrOXYzine (VISTARIL) 50 MG capsule TAKE 1 CAPSULE BY MOUTH EVERY 8  HOURS AS NEEDED FOR ANXIETY 180 capsule 1   lisinopril (ZESTRIL) 40 MG tablet TAKE 1 TABLET BY MOUTH DAILY 100 tablet 1   pantoprazole (PROTONIX) 40 MG tablet Take 1 tablet (40 mg total) by mouth daily. 90 tablet 1   QUEtiapine Fumarate (SEROQUEL XR) 150 MG 24 hr tablet Take 1 tablet (150 mg total) by mouth at bedtime. 30 tablet 11   sucralfate (CARAFATE) 1 g tablet Take 1 g by mouth 3 (three) times daily.     trazodone (DESYREL) 300 MG tablet Take 1 tablet (300 mg total) by mouth at bedtime. 30 tablet 11   No current facility-administered medications for this visit.     Musculoskeletal: Defer Psychiatric Specialty Exam: Review of Systems  Psychiatric/Behavioral:  Positive for sleep disturbance. Negative for dysphoric mood and suicidal ideas. The patient is nervous/anxious.     There were no vitals taken for this visit.There is no height or weight on file to calculate BMI.  General Appearance: NA  Eye Contact:  NA  Speech:  Clear and Coherent  Volume:  Normal  Mood:  Euthymic  Affect:  Appropriate  Thought Process:  Coherent  Orientation:  Full (Time, Place, and Person)  Thought Content: Logical   Suicidal Thoughts:  No  Homicidal Thoughts:  No  Memory:  Immediate;   Good Recent;   Good Remote;   Good  Judgement:  Fair  Insight:  Fair  Psychomotor Activity:  NA  Concentration:  Concentration: Good  Recall:  NA  Fund of Knowledge: Good  Language: Good  Akathisia:  No  Handed:    AIMS (if indicated): not done  Assets:  Communication Skills Desire for Improvement Housing Resilience Social Support  ADL's:  Intact  Cognition: WNL  Sleep:  Poor   Screenings: AUDIT    Flowsheet Row Admission (Discharged) from 07/02/2013 in [facility]  Alcohol Use Disorder Identification Test Final Score (AUDIT) 0      GAD-7    Flowsheet Row Video Visit from  10/13/2021 in [facility] Video Visit from 03/10/2021 in [facility] Video Visit from 01/06/2021 in [facility] Video Visit from 11/06/2020 in [facility] Office Visit from 07/30/2019 in [place]  Total GAD-7 Score 5 2 9 13 19       PHQ2-9    [place] Office Visit from 11/24/2021 in [place] [facility] [place] from 10/13/2021 in [facility] Office Visit from 07/01/2021 in [place] [facility] [place] from 06/09/2021 in [place] [facility] [place] from 03/10/2021 in [facility]  PHQ-2 Total Score 0 1 0 0 0  PHQ-9 Total Score 6 -- 6 4 --      Flowsheet Row Video Visit from 10/13/2021 in [facility] Video Visit from 03/10/2021 in [facility] Admission (Discharged) from 03/05/2021 in [place] No Risk No Risk        Assessment and Plan:  Lamar Chokshi is a 61 year old female with a past  psychiatric history significant for substance-induced mood disorder, anxiety, insomnia, and PTSD Overall, patient appears to be doing fairly well however she does continue to endorse anxiety and feeling as though "something must be done."  Patient is requiring her hydroxyzine 3 times a day and reports that she can feel worse when she misses a dose.  Optimizing patient's Prozac may help with this as well as her intrusive thoughts related to her PTSD symptoms.  We will continue to monitor and follow-up due to reported history of bipolar disorder however, based on chart review there is some question of whether or not this was substance-induced mood disorder.  There is also now some concern as to whether patient suffers from general impulse control disorder leading to  a shopping addiction in the past.  Insomnia, unspecified - Patient to follow-up with sleep medicine - Continue trazodone 300 mg nightly  PTSD - Increase Prozac to 60 mg daily  Anxiety - Prozac per above  Substance-induced mood disorder (R/O bipolar disorder) - Continue Seroquel 150 mg nightly - Continue Prozac per above  Cocaine use disorder, in early remission Cannabis use disorder, in remission  Follow-up in approximately 1 month  Collaboration of Care: Collaboration of Care:   Patient/Guardian was advised Release of Information must be obtained prior to any record release in order to collaborate their care with an outside provider. Patient/Guardian was advised if they have not already done so to contact the registration department to sign all necessary forms in order for [place] to release information regarding their care.   Consent: Patient/Guardian gives verbal consent for treatment and assignment of benefits for services provided during this visit. Patient/Guardian expressed understanding and agreed to proceed.    Bobbye Morton, MD 12/14/2021, 7:03 PM

## 2021-12-15 NOTE — Telephone Encounter (Signed)
Referral placed and patient is aware. 

## 2021-12-22 ENCOUNTER — Ambulatory Visit (INDEPENDENT_AMBULATORY_CARE_PROVIDER_SITE_OTHER): Payer: Medicare Other | Admitting: Internal Medicine

## 2021-12-22 VITALS — BP 110/79 | HR 79 | Temp 97.9°F | Wt 195.9 lb

## 2021-12-22 DIAGNOSIS — M545 Low back pain, unspecified: Secondary | ICD-10-CM | POA: Diagnosis not present

## 2021-12-22 NOTE — Progress Notes (Signed)
Acute office Visit     CC/Reason for Visit: Low back pain  HPI: Marisa Sherman is a 61 y.o. female who is coming in today for the above mentioned reasons.  Has been experiencing low back pain for the past 3 weeks.  Pain is located in the low back and spreads to both hips bilaterally.  There is no bowel or bladder incontinence, no urinary symptoms, no radiation down legs.   Past Medical/Surgical History: Past Medical History:  Diagnosis Date   Abnormal Pap smear of cervix    Allergy    Anxiety    Bipolar disorder (HCC)    Cataract    Complication of anesthesia    Degenerative disc disease at L5-S1 level    Depression    GERD (gastroesophageal reflux disease)    Gout    Hyperlipidemia    Hypertension    Overactive bladder    PONV (postoperative nausea and vomiting)     Past Surgical History:  Procedure Laterality Date   ANTERIOR CERVICAL DECOMP/DISCECTOMY FUSION N/A 03/05/2021   Procedure: ACDF - C5-C6 - C6-C7;  Surgeon: Julio Sicks, MD;  Location: [facility] OR;  Service: Neurosurgery;  Laterality: N/A;  3C   CARPAL TUNNEL RELEASE Bilateral    CERVIX LESION DESTRUCTION  1985   CHOLECYSTECTOMY     DILATION AND CURETTAGE OF UTERUS     EYE SURGERY Bilateral    cataract removal   TONSILLECTOMY     TUBAL LIGATION      Social History:  reports that she has been smoking cigarettes. She has a 40.00 pack-year smoking history. She has never used smokeless tobacco. She reports that she does not currently use alcohol. She reports that she does not currently use drugs.  Allergies: Allergies  Allergen Reactions   Codeine Nausea Only   Diphenhydramine Hcl Other (See Comments)    Pt unsure why   Lithium Itching   Sulfa Antibiotics Nausea And Vomiting   Amoxicillin Nausea And Vomiting    Family History:  Family History  Problem Relation Age of Onset   Depression Mother    Anxiety disorder Mother    Asthma Mother    Bipolar disorder Father    Depression Sister    Anxiety  disorder Sister    Depression Brother    Anxiety disorder Brother    Depression Brother    Anxiety disorder Brother    Cancer Neg Hx    Diabetes Neg Hx    Heart disease Neg Hx    Colon cancer Neg Hx    Breast cancer Neg Hx    Ovarian cancer Neg Hx    Cervical cancer Neg Hx      Current Outpatient Medications:    acetaminophen (TYLENOL) 500 MG tablet, Take 1,000 mg by mouth every 6 (six) hours as needed for mild pain., Disp: , Rfl:    allopurinol (ZYLOPRIM) 100 MG tablet, TAKE 1 TABLET BY MOUTH DAILY, Disp: 100 tablet, Rfl: 0   aspirin EC 81 MG tablet, Take 81 mg by mouth every evening., Disp: , Rfl:    atorvastatin (LIPITOR) 40 MG tablet, TAKE 1 TABLET BY MOUTH DAILY AT  6 PM, Disp: 100 tablet, Rfl: 0   buPROPion (WELLBUTRIN XL) 150 MG 24 hr tablet, Take 1 tablet (150 mg total) by mouth daily., Disp: 90 tablet, Rfl: 1   FLUoxetine (PROZAC) 20 MG capsule, Take 3 capsules (60 mg total) by mouth daily., Disp: 90 capsule, Rfl: 2   hydrOXYzine (VISTARIL) 50 MG  capsule, TAKE 1 CAPSULE BY MOUTH EVERY 8  HOURS AS NEEDED FOR ANXIETY, Disp: 180 capsule, Rfl: 1   lisinopril (ZESTRIL) 40 MG tablet, TAKE 1 TABLET BY MOUTH DAILY, Disp: 100 tablet, Rfl: 1   pantoprazole (PROTONIX) 40 MG tablet, Take 1 tablet (40 mg total) by mouth daily., Disp: 90 tablet, Rfl: 1   QUEtiapine Fumarate (SEROQUEL XR) 150 MG 24 hr tablet, Take 1 tablet (150 mg total) by mouth at bedtime., Disp: 30 tablet, Rfl: 11   sucralfate (CARAFATE) 1 g tablet, Take 1 g by mouth 3 (three) times daily., Disp: , Rfl:    trazodone (DESYREL) 300 MG tablet, Take 1 tablet (300 mg total) by mouth at bedtime., Disp: 30 tablet, Rfl: 11  Review of Systems:  Constitutional: Denies fever, chills, diaphoresis, appetite change and fatigue.  HEENT: Denies photophobia, eye pain, redness, hearing loss, ear pain, congestion, sore throat, rhinorrhea, sneezing, mouth sores, trouble swallowing, neck pain, neck stiffness and tinnitus.   Respiratory:  Denies SOB, DOE, cough, chest tightness,  and wheezing.   Cardiovascular: Denies chest pain, palpitations and leg swelling.  Gastrointestinal: Denies nausea, vomiting, abdominal pain, diarrhea, constipation, blood in stool and abdominal distention.  Genitourinary: Denies dysuria, urgency, frequency, hematuria, flank pain and difficulty urinating.  Endocrine: Denies: hot or cold intolerance, sweats, changes in hair or nails, polyuria, polydipsia. Musculoskeletal: Denies joint swelling, arthralgias and gait problem.  Skin: Denies pallor, rash and wound.  Neurological: Denies dizziness, seizures, syncope, weakness, light-headedness, numbness and headaches.  Hematological: Denies adenopathy. Easy bruising, personal or family bleeding history  Psychiatric/Behavioral: Denies suicidal ideation, mood changes, confusion, nervousness, sleep disturbance and agitation    Physical Exam: Vitals:   12/22/21 0948 12/22/21 0951  BP: (!) 150/90 110/79  Pulse: 79   Temp: 97.9 F (36.6 C)   TempSrc: Oral   SpO2: 90%   Weight: 195 lb 14.4 oz (88.9 kg)     Body mass index is 32.6 kg/m.   Constitutional: NAD, calm, comfortable Eyes: PERRL, lids and conjunctivae normal ENMT: Mucous membranes are moist.  Psychiatric: Normal judgment and insight. Alert and oriented x 3. Normal mood.    Impression and Plan:  Acute midline low back pain without sciatica  -Suspect this is simply musculoskeletal pain given lack of red flag signs/symptoms. -Advised icing, as needed NSAIDs, back stretches, local massage therapy. -If no improvement in 3 to 4 weeks, can consider referral to physical therapy.   Time spent:22 minutes reviewing chart, interviewing and examining patient and formulating plan of care.      Chaya Jan, MD [facility]
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
## 2021-12-30 ENCOUNTER — Ambulatory Visit (INDEPENDENT_AMBULATORY_CARE_PROVIDER_SITE_OTHER): Payer: Medicare Other | Admitting: Internal Medicine

## 2021-12-30 ENCOUNTER — Encounter: Payer: Self-pay | Admitting: Internal Medicine

## 2021-12-30 VITALS — BP 130/86 | HR 63 | Ht 66.0 in | Wt 192.2 lb

## 2021-12-30 DIAGNOSIS — R0683 Snoring: Secondary | ICD-10-CM

## 2021-12-30 DIAGNOSIS — Z720 Tobacco use: Secondary | ICD-10-CM | POA: Diagnosis not present

## 2021-12-30 NOTE — Patient Instructions (Signed)
Order- schedule home sleep test dx snoring  Please call us about 2 weeks after your sleep test for results and recommendations 

## 2022-01-02 ENCOUNTER — Other Ambulatory Visit: Payer: Self-pay | Admitting: Internal Medicine

## 2022-01-07 ENCOUNTER — Encounter (HOSPITAL_COMMUNITY): Payer: Self-pay | Admitting: Student in an Organized Health Care Education/Training Program

## 2022-01-07 ENCOUNTER — Telehealth (INDEPENDENT_AMBULATORY_CARE_PROVIDER_SITE_OTHER): Payer: Medicare Other | Admitting: Student in an Organized Health Care Education/Training Program

## 2022-01-07 DIAGNOSIS — F4310 Post-traumatic stress disorder, unspecified: Secondary | ICD-10-CM | POA: Diagnosis not present

## 2022-01-07 DIAGNOSIS — G4700 Insomnia, unspecified: Secondary | ICD-10-CM

## 2022-01-07 DIAGNOSIS — F419 Anxiety disorder, unspecified: Secondary | ICD-10-CM | POA: Diagnosis not present

## 2022-01-07 DIAGNOSIS — F1994 Other psychoactive substance use, unspecified with psychoactive substance-induced mood disorder: Secondary | ICD-10-CM | POA: Diagnosis not present

## 2022-01-07 MED ORDER — HYDROXYZINE PAMOATE 50 MG PO CAPS: ORAL_CAPSULE | ORAL | 2 refills | Status: DC

## 2022-01-07 MED ORDER — FLUOXETINE HCL 20 MG PO CAPS: 60.0000 mg | ORAL_CAPSULE | Freq: Every day | ORAL | 4 refills | Status: DC

## 2022-01-07 NOTE — Progress Notes (Signed)
[facility] MD/PA/NP OP Progress Note  01/07/2022 10:55 AM Marisa Sherman  MRN:  [id number]  Chief Complaint: No chief complaint on file.  HPI: Virtual Visit via Telephone Note  I connected with Marisa Sherman on 01/07/22 at 10:30 AM EST by telephone and verified that I am speaking with the correct person using two identifiers.  Location: Patient: Work, Psychologist, sport and exercise: Office   I discussed the limitations, risks, security and privacy concerns of performing an evaluation and management service by telephone and the availability of in person appointments. I also discussed with the patient that there may be a patient responsible charge related to this service. The patient expressed understanding and agreed to proceed.   History of Present Illness: Marisa Sherman is a 61 year old female with a past psychiatric history significant for substance-induced mood disorder, anxiety, insomnia, and PTSD  Patient has been compliant with the following medication regimen: Hydroxyzine 50 mg 3 times daily as needed Seroquel XR 150 mg nightly Prozac 60 mg Trazodone 300 mg nightly    Patient reports that she is doing well and work has been going well. Patient reports she has started smoking again, but is not taking the Wellbutrin XL. Patient reports she did not have any have issues with the Wellbutrin.  After brief discussion, patient was encouraged to restart medication as it may help her decrease smoking.  Patient reports that in regards to the Prozac increase she is not having intrusive thoughts as much, and has not noticed any adverse side effects. Patient reports her mood is been "good." Patient reports she is not sleeping well, but will be having a sleep study.  Patient reports she is taking the Seroquel, and has not had any behavior concerning for mania. Patient reports that she is working on her spending habits, and she is doing well with her budgeting and money spending. Patient endorses that she has  recognized her spending was a big issues. Patient denies SI, HI, and AVH. Patient reports that she overall feels her anxiety has improved and she is also still taking the hydroxyzine TID.   Patient has been sober 21 mon. From all substances.     I discussed the assessment and treatment plan with the patient. The patient was provided an opportunity to ask questions and all were answered. The patient agreed with the plan and demonstrated an understanding of the instructions.   The patient was advised to call back or seek an in-person evaluation if the symptoms worsen or if the condition fails to improve as anticipated.  I provided 20 minutes of non-face-to-face time during this encounter.   Bobbye Morton, MD  Visit Diagnosis:    ICD-10-CM   1. Anxiety  F41.9 FLUoxetine (PROZAC) 20 MG capsule    hydrOXYzine (VISTARIL) 50 MG capsule    2. PTSD (post-traumatic stress disorder)  F43.10 FLUoxetine (PROZAC) 20 MG capsule    3. Substance induced mood disorder (HCC)  F19.94 FLUoxetine (PROZAC) 20 MG capsule    4. Insomnia, unspecified type  G47.00       Past Psychiatric History: Bipolar d/o, substance induced mood disorder, PTSD, anxiety. Was bring seen at Psychiatry clinic [facility] in [place] for past 4-5 years    Past Medical History:  Past Medical History:  Diagnosis Date   Abnormal Pap smear of cervix    Allergy    Anxiety    Bipolar disorder (HCC)    Cataract    Complication of anesthesia    Degenerative disc disease at L5-S1 level  Depression    GERD (gastroesophageal reflux disease)    Gout    Hyperlipidemia    Hypertension    Overactive bladder    PONV (postoperative nausea and vomiting)     Past Surgical History:  Procedure Laterality Date   ANTERIOR CERVICAL DECOMP/DISCECTOMY FUSION N/A 03/05/2021   Procedure: ACDF - C5-C6 - C6-C7;  Surgeon: Julio SicksPool, Henry, MD;  Location: [facility] OR;  Service: Neurosurgery;  Laterality: N/A;  3C   CARPAL TUNNEL RELEASE Bilateral    CERVIX  LESION DESTRUCTION  1985   CHOLECYSTECTOMY     DILATION AND CURETTAGE OF UTERUS     EYE SURGERY Bilateral    cataract removal   TONSILLECTOMY     TUBAL LIGATION      Family Psychiatric History: History of depression, anxiety in mother.  She also informed history of anxiety and depression in her siblings.  Father had a gambling issue.   Family History:  Family History  Problem Relation Age of Onset   Depression Mother    Anxiety disorder Mother    Asthma Mother    Bipolar disorder Father    Depression Sister    Anxiety disorder Sister    Depression Brother    Anxiety disorder Brother    Depression Brother    Anxiety disorder Brother    Cancer Neg Hx    Diabetes Neg Hx    Heart disease Neg Hx    Colon cancer Neg Hx    Breast cancer Neg Hx    Ovarian cancer Neg Hx    Cervical cancer Neg Hx     Social History:  Social History   Socioeconomic History   Marital status: Divorced    Spouse name: Not on file   Number of children: 3   Years of education: Not on file   Highest education level: GED or equivalent  Occupational History   Occupation: Disablilty  Tobacco Use   Smoking status: Every Day    Packs/day: 1.00    Years: 40.00    Total pack years: 40.00    Types: Cigarettes    Passive exposure: Current   Smokeless tobacco: Never   Tobacco comments:    Pt smokes 1 pack per day 12/30/21 PAP  Vaping Use   Vaping Use: Never used  Substance and Sexual Activity   Alcohol use: Not Currently   Drug use: Not Currently    Comment: 90 days recovery   Sexual activity: Not Currently  Other Topics Concern   Not on file  Social History Narrative   Not on file   Social Determinants of Health   Financial Resource Strain: Low Risk  (07/08/2020)   Overall Financial Resource Strain (CARDIA)    Difficulty of Paying Living Expenses: Not hard at all  Food Insecurity: No Food Insecurity (08/25/2020)   Hunger Vital Sign    Worried About Running Out of Food in the Last Year:  Never true    Ran Out of Food in the Last Year: Never true  Transportation Needs: No Transportation Needs (08/25/2020)   PRAPARE - Administrator, Civil ServiceTransportation    Lack of Transportation (Medical): No    Lack of Transportation (Non-Medical): No  Physical Activity: Sufficiently Active (07/08/2020)   Exercise Vital Sign    Days of Exercise per Week: 7 days    Minutes of Exercise per Session: 30 min  Stress: Stress Concern Present (07/08/2020)   [employer] of [employer]ational [employer] - Occupational Stress Questionnaire    Feeling of Stress : Rather much  Social Connections: Moderately Isolated (07/08/2020)   Social Connection and Isolation Panel [NHANES]    Frequency of Communication with Friends and Family: Twice a week    Frequency of Social Gatherings with Friends and Family: Never    Attends Religious Services: 1 to 4 times per year    Active Member of [employer] or Organizations: Yes    Attends Engineer, structural: More than 4 times per year    Marital Status: Divorced    Allergies:  Allergies  Allergen Reactions   Codeine Nausea Only   Diphenhydramine Hcl Other (See Comments)    Pt unsure why   Lithium Itching   Sulfa Antibiotics Nausea And Vomiting   Amoxicillin Nausea And Vomiting    Metabolic Disorder Labs: Lab Results  Component Value Date   HGBA1C 6.2 11/24/2021   No results found for: "PROLACTIN" Lab Results  Component Value Date   CHOL 172 11/24/2021   TRIG 201.0 (H) 11/24/2021   HDL 39.40 11/24/2021   CHOLHDL 4 11/24/2021   VLDL 40.2 (H) 11/24/2021   LDLCALC 98 04/13/2020   LDLCALC 90 04/01/2019   Lab Results  Component Value Date   TSH 2.610 04/13/2020   TSH 1.720 07/05/2013    Therapeutic Level Labs: Lab Results  Component Value Date   LITHIUM 0.50 (L) 07/10/2013   LITHIUM <0.25 (L) 07/01/2013   No results found for: "VALPROATE" Lab Results  Component Value Date   CBMZ 7.6 07/10/2013    Current Medications: Current Outpatient Medications   Medication Sig Dispense Refill   acetaminophen (TYLENOL) 500 MG tablet Take 1,000 mg by mouth every 6 (six) hours as needed for mild pain.     allopurinol (ZYLOPRIM) 100 MG tablet TAKE 1 TABLET BY MOUTH DAILY 100 tablet 1   aspirin EC 81 MG tablet Take 81 mg by mouth every evening.     atorvastatin (LIPITOR) 40 MG tablet TAKE 1 TABLET BY MOUTH DAILY AT  6 PM 100 tablet 0   buPROPion (WELLBUTRIN XL) 150 MG 24 hr tablet Take 1 tablet (150 mg total) by mouth daily. 90 tablet 1   FLUoxetine (PROZAC) 20 MG capsule Take 3 capsules (60 mg total) by mouth daily. 90 capsule 4   hydrOXYzine (VISTARIL) 50 MG capsule TAKE 1 CAPSULE BY MOUTH EVERY 8  HOURS AS NEEDED FOR ANXIETY 180 capsule 2   lisinopril (ZESTRIL) 40 MG tablet TAKE 1 TABLET BY MOUTH DAILY 100 tablet 1   pantoprazole (PROTONIX) 40 MG tablet Take 1 tablet (40 mg total) by mouth daily. 90 tablet 1   QUEtiapine Fumarate (SEROQUEL XR) 150 MG 24 hr tablet Take 1 tablet (150 mg total) by mouth at bedtime. 30 tablet 11   sucralfate (CARAFATE) 1 g tablet Take 1 g by mouth daily.     trazodone (DESYREL) 300 MG tablet Take 1 tablet (300 mg total) by mouth at bedtime. 30 tablet 11   No current facility-administered medications for this visit.     Musculoskeletal: defer Psychiatric Specialty Exam: Review of Systems  There were no vitals taken for this visit.There is no height or weight on file to calculate BMI.  General Appearance: NA  Eye Contact:  NA  Speech:  Clear and Coherent  Volume:  Normal  Mood:  Euthymic  Affect:  NA  Thought Process:  Coherent  Orientation:  Full (Time, Place, and Person)  Thought Content: Logical   Suicidal Thoughts:  No  Homicidal Thoughts:  No  Memory:  Immediate;   Good  Recent;   Good  Judgement:  Good  Insight:  Good  Psychomotor Activity:  NA  Concentration:  Concentration: Good  Recall:  NA  Fund of Knowledge: Good  Language: Good  Akathisia:  No  Handed:    AIMS (if indicated): not done   Assets:  Communication Skills Desire for Improvement Housing Resilience Vocational/Educational  ADL's:  Intact  Cognition: WNL  Sleep:  Poor   Screenings: AUDIT    Flowsheet Row [facility]  Alcohol Use Disorder Identification Test Final Score (AUDIT) 0      GAD-7    Flowsheet Row Video Visit from 10/13/2021 in [facility] Video Visit from 03/10/2021 in [facility] Video Visit from 01/06/2021 in [facility] Video Visit from 11/06/2020 in [facility] Office Visit from 07/30/2019 in [facility]  Total GAD-7 Score 5 2 9 13 19       PHQ2-9    Flowsheet Row Office Visit from 12/22/2021 in [facility] Office Visit from 11/24/2021 in [facility] Video Visit from 10/13/2021 in [facility] Office Visit from 07/01/2021 in [facility] Office Visit from 06/09/2021 in [facility] [place]  PHQ-2 Total Score 0 0 1 0 0  PHQ-9 Total Score 4 6 -- 6 4      Flowsheet Row Video Visit from 10/13/2021 in [facility] Video Visit from 03/10/2021 in [facility] Admission (Discharged) from 03/05/2021 in [facility]  C-SSRS RISK CATEGORY Low Risk No Risk No Risk        Assessment and Plan:  Neiva Maenza is a 61 year old female with a past psychiatric history significant for substance-induced mood disorder, anxiety, insomnia, and PTSD. Patient assessment today patient appears to be doing significantly better than at last assessment.  Patient appears to be stabilized with intrusive thoughts decreasing.  Patient's obsessive spending, may be a independent impulse control diagnoses not related to bipolar disorder.  Patient  sobriety is also likely contributing to mood stabilization.  Regardless we will continue patient on Seroquel.  Patient's sleep will be further investigated by sleep specialist.  Insomnia, unspecified - Patient to continue with sleep medicine - Continue trazodone 20 mg nightly  PTSD - Continue Prozac 60 mg daily  Anxiety - Continue Prozac per above  Substance-induced mood disorder  - Continue Seroquel 150 mg nightly - Continue Prozac per above  Cocaine use disorder in remission Cannabis use disorder, in remission  F/u 3 mon  Collaboration of Care: Collaboration of Care:   Patient/Guardian was advised Release of Information must be obtained prior to any record release in order to collaborate their care with an outside provider. Patient/Guardian was advised if they have not already done so to contact the registration department to sign all necessary forms in order for 62 to release information regarding their care.   Consent: Patient/Guardian gives verbal consent for treatment and assignment of benefits for services provided during this visit. Patient/Guardian expressed understanding and agreed to proceed.   PGY-3 [place], MD 01/07/2022, 10:55 AM

## 2022-01-12 ENCOUNTER — Other Ambulatory Visit: Payer: Self-pay | Admitting: Internal Medicine

## 2022-01-21 ENCOUNTER — Other Ambulatory Visit: Payer: Self-pay | Admitting: *Deleted

## 2022-01-21 ENCOUNTER — Ambulatory Visit (AMBULATORY_SURGERY_CENTER): Payer: Medicare Other | Admitting: *Deleted

## 2022-01-21 VITALS — Ht 66.0 in | Wt 192.0 lb

## 2022-01-21 DIAGNOSIS — Z87891 Personal history of nicotine dependence: Secondary | ICD-10-CM

## 2022-01-21 DIAGNOSIS — Z1211 Encounter for screening for malignant neoplasm of colon: Secondary | ICD-10-CM

## 2022-01-21 DIAGNOSIS — F17210 Nicotine dependence, cigarettes, uncomplicated: Secondary | ICD-10-CM

## 2022-01-21 DIAGNOSIS — Z122 Encounter for screening for malignant neoplasm of respiratory organs: Secondary | ICD-10-CM

## 2022-01-21 MED ORDER — NA SULFATE-K SULFATE-MG SULF 17.5-3.13-1.6 GM/177ML PO SOLN: 1.0000 | Freq: Once | ORAL | 0 refills | Status: AC

## 2022-01-21 NOTE — Progress Notes (Signed)
No egg or soy allergy known to patient  No issues known to pt with past sedation with any surgeries or procedures Patient denies ever being told they had issues or difficulty with intubation  No FH of Malignant Hyperthermia Pt is not on diet pills Pt is not on  home 02  Pt is not on blood thinners  Pt has issues with constipation takes stool; softener QD Pt is not on dialysis Pt denies any upcoming cardiac testing Pt encouraged to use to use Singlecare or Goodrx to reduce cost  Patient's chart reviewed by Cathlyn Parsons CNRA prior to previsit and patient appropriate for the [facility].  Previsit completed and red dot placed by patient's name on their procedure day (on provider's schedule).  . Pre-visit done by phone  Instructions sent by mail

## 2022-01-22 DIAGNOSIS — M62830 Muscle spasm of back: Secondary | ICD-10-CM | POA: Diagnosis not present

## 2022-01-22 DIAGNOSIS — M5441 Lumbago with sciatica, right side: Secondary | ICD-10-CM | POA: Diagnosis not present

## 2022-01-22 DIAGNOSIS — M5442 Lumbago with sciatica, left side: Secondary | ICD-10-CM | POA: Diagnosis not present

## 2022-01-22 DIAGNOSIS — G8929 Other chronic pain: Secondary | ICD-10-CM | POA: Diagnosis not present

## 2022-02-02 ENCOUNTER — Telehealth: Payer: Self-pay | Admitting: Internal Medicine

## 2022-02-02 ENCOUNTER — Encounter: Payer: Self-pay | Admitting: Internal Medicine

## 2022-02-02 DIAGNOSIS — R0683 Snoring: Secondary | ICD-10-CM | POA: Insufficient documentation

## 2022-02-02 NOTE — Telephone Encounter (Signed)
Patient rescheduled for procedure. Please update prep instructions 

## 2022-02-02 NOTE — Assessment & Plan Note (Signed)
Encouraged to work on smoking cessation.  Notes social stressors.

## 2022-02-02 NOTE — Assessment & Plan Note (Signed)
May be complex set of sleep problems but we will focus on potential for sleep apnea initially. Plan-schedule sleep study

## 2022-02-02 NOTE — Telephone Encounter (Signed)
LMOM that I am mailing her new prep instructions.

## 2022-02-03 ENCOUNTER — Other Ambulatory Visit: Payer: Self-pay | Admitting: Internal Medicine

## 2022-02-04 ENCOUNTER — Encounter: Payer: Medicare Other | Admitting: Internal Medicine

## 2022-02-08 ENCOUNTER — Other Ambulatory Visit: Payer: Self-pay | Admitting: Internal Medicine

## 2022-02-08 DIAGNOSIS — I10 Essential (primary) hypertension: Secondary | ICD-10-CM

## 2022-02-25 ENCOUNTER — Ambulatory Visit: Payer: 59

## 2022-02-25 DIAGNOSIS — G4733 Obstructive sleep apnea (adult) (pediatric): Secondary | ICD-10-CM | POA: Diagnosis not present

## 2022-02-25 DIAGNOSIS — R0683 Snoring: Secondary | ICD-10-CM

## 2022-03-02 ENCOUNTER — Ambulatory Visit
Admission: RE | Admit: 2022-03-02 | Discharge: 2022-03-02 | Disposition: A | Discharge status: Home / Self Care | Payer: 59 | Source: Ambulatory Visit | Attending: Acute Care | Admitting: Acute Care

## 2022-03-02 ENCOUNTER — Ambulatory Visit (INDEPENDENT_AMBULATORY_CARE_PROVIDER_SITE_OTHER): Payer: 59 | Admitting: Acute Care

## 2022-03-02 ENCOUNTER — Encounter: Payer: Self-pay | Admitting: Acute Care

## 2022-03-02 DIAGNOSIS — K449 Diaphragmatic hernia without obstruction or gangrene: Secondary | ICD-10-CM | POA: Diagnosis not present

## 2022-03-02 DIAGNOSIS — Z122 Encounter for screening for malignant neoplasm of respiratory organs: Secondary | ICD-10-CM

## 2022-03-02 DIAGNOSIS — J432 Centrilobular emphysema: Secondary | ICD-10-CM | POA: Diagnosis not present

## 2022-03-02 DIAGNOSIS — F17210 Nicotine dependence, cigarettes, uncomplicated: Secondary | ICD-10-CM

## 2022-03-02 DIAGNOSIS — I2510 Atherosclerotic heart disease of native coronary artery without angina pectoris: Secondary | ICD-10-CM | POA: Diagnosis not present

## 2022-03-02 DIAGNOSIS — Z87891 Personal history of nicotine dependence: Secondary | ICD-10-CM

## 2022-03-02 NOTE — Progress Notes (Signed)
Virtual Visit via Telephone Note  I connected with Marisa Sherman on 03/02/22 at 11:30 AM EST by telephone and verified that I am speaking with the correct person using two identifiers.  Location: Patient:  At home Provider: [address], [place], [place], [address]    I discussed the limitations, risks, security and privacy concerns of performing an evaluation and management service by telephone and the availability of in person appointments. I also discussed with the patient that there may be a patient responsible charge related to this service. The patient expressed understanding and agreed to proceed.   Shared Decision Making Visit Lung Cancer Screening Program [phone])   Eligibility: Age 62 y.o. Pack Years Smoking History Calculation 48 pack year smoking history (# packs/per year x # years smoked) Recent History of coughing up blood  no Unexplained weight loss? no ( >Than 15 pounds within the last 6 months ) Prior History Lung / other cancer no (Diagnosis within the last 5 years already requiring surveillance chest CT Scans). Smoking Status Current Smoker Former Smokers: Years since quit:  NA  Quit Date:  NA  Visit Components: Discussion included one or more decision making aids. yes Discussion included risk/benefits of screening. yes Discussion included potential follow up diagnostic testing for abnormal scans. yes Discussion included meaning and risk of over diagnosis. yes Discussion included meaning and risk of False Positives. yes Discussion included meaning of total radiation exposure. yes  Counseling Included: Importance of adherence to annual lung cancer LDCT screening. yes Impact of comorbidities on ability to participate in the program. yes Ability and willingness to under diagnostic treatment. yes  Smoking Cessation Counseling: Current Smokers:  Discussed importance of smoking cessation. yes Information about tobacco cessation classes and interventions  provided to patient. yes Patient provided with "ticket" for LDCT Scan. yes Symptomatic Patient. no  Counseling NA Diagnosis Code: Tobacco Use Z72.0 Asymptomatic Patient yes  Counseling (Intermediate counseling: > three minutes counseling) [id number] Former Smokers:  Discussed the importance of maintaining cigarette abstinence. yes Diagnosis Code: Personal History of Nicotine Dependence. T46.568 Information about tobacco cessation classes and interventions provided to patient. Yes Patient provided with "ticket" for LDCT Scan. yes Written Order for Lung Cancer Screening with LDCT placed in Epic. Yes (CT Chest Lung Cancer Screening Low Dose W/O CM) [id number] Z12.2-Screening of respiratory organs Z87.891-Personal history of nicotine dependence  I have spent 25 minutes of face to face/ virtual visit   time with  Marisa Sherman discussing the risks and benefits of lung cancer screening. We viewed / discussed a power point together that explained in detail the above noted topics. We paused at intervals to allow for questions to be asked and answered to ensure understanding.We discussed that the single most powerful action that she can take to decrease her risk of developing lung cancer is to quit smoking. We discussed whether or not she is ready to commit to setting a quit date. We discussed options for tools to aid in quitting smoking including nicotine replacement therapy, non-nicotine medications, support groups, Quit Smart classes, and behavior modification. We discussed that often times setting smaller, more achievable goals, such as eliminating 1 cigarette a day for a week and then 2 cigarettes a day for a week can be helpful in slowly decreasing the number of cigarettes smoked. This allows for a sense of accomplishment as well as providing a clinical benefit. I provided  her  with smoking cessation  information  with contact information for community resources, classes, free nicotine  replacement therapy, and  access to mobile apps, text messaging, and on-line smoking cessation help. I have also provided  her  the office contact information in the event she needs to contact me, or the screening staff. We discussed the time and location of the scan, and that either Doroteo Glassman RN, Joella Prince, RN  or I will call / send a letter with the results within 24-72 hours of receiving them. The patient verbalized understanding of all of  the above and had no further questions upon leaving the office. They have my contact information in the event they have any further questions.  I spent 3 minutes counseling on smoking cessation and the health risks of continued tobacco abuse.  I explained to the patient that there has been a high incidence of coronary artery disease noted on these exams. I explained that this is a non-gated exam therefore degree or severity cannot be determined. This patient is on statin therapy. I have asked the patient to follow-up with their PCP regarding any incidental finding of coronary artery disease and management with diet or medication as their PCP  feels is clinically indicated. The patient verbalized understanding of the above and had no further questions upon completion of the visit.      Magdalen Spatz, NP 03/02/2022

## 2022-03-02 NOTE — Patient Instructions (Signed)
Thank you for participating in the [facility]. It was our pleasure to meet you today. We will call you with the results of your scan within the next few days. Your scan will be assigned a Lung RADS category score by the physicians reading the scans.  This Lung RADS score determines follow up scanning.  See below for description of categories, and follow up screening recommendations. We will be in touch to schedule your follow up screening annually or based on recommendations of our providers. We will fax a copy of your scan results to your Primary Care Physician, or the physician who referred you to the program, to ensure they have the results. Please call the office if you have any questions or concerns regarding your scanning experience or results.  Our office number is [phone]. Please speak with Denise Phelps, RN. , or  Denise Buckner RN, They are  our Lung Cancer Screening RN.'s If They are unavailable when you call, Please leave a message on the voice mail. We will return your call at our earliest convenience.This voice mail is monitored several times a day.  Remember, if your scan is normal, we will scan you annually as long as you continue to meet the criteria for the program. (Age 50-80, Current smoker or smoker who has quit within the last 15 years). If you are a smoker, remember, quitting is the single most powerful action that you can take to decrease your risk of lung cancer and other pulmonary, breathing related problems. We know quitting is hard, and we are here to help.  Please let us know if there is anything we can do to help you meet your goal of quitting. If you are a former smoker, congratulations. We are proud of you! Remain smoke free! Remember you can refer friends or family members through the number above.  We will screen them to make sure they meet criteria for the program. Thank you for helping us take better care of you by  participating in Lung Screening.  You can receive free nicotine replacement therapy ( patches, gum or mints) by calling 1-800-QUIT NOW. Please call so we can get you on the path to becoming  a non-smoker. I know it is hard, but you can do this!  Lung RADS Categories:  Lung RADS 1: no nodules or definitely non-concerning nodules.  Recommendation is for a repeat annual scan in 12 months.  Lung RADS 2:  nodules that are non-concerning in appearance and behavior with a very low likelihood of becoming an active cancer. Recommendation is for a repeat annual scan in 12 months.  Lung RADS 3: nodules that are probably non-concerning , includes nodules with a low likelihood of becoming an active cancer.  Recommendation is for a 6-month repeat screening scan. Often noted after an upper respiratory illness. We will be in touch to make sure you have no questions, and to schedule your 6-month scan.  Lung RADS 4 A: nodules with concerning findings, recommendation is most often for a follow up scan in 3 months or additional testing based on our provider's assessment of the scan. We will be in touch to make sure you have no questions and to schedule the recommended 3 month follow up scan.  Lung RADS 4 B:  indicates findings that are concerning. We will be in touch with you to schedule additional diagnostic testing based on our provider's  assessment of the scan.  Other options for assistance in smoking cessation (   As covered by your insurance benefits)  Hypnosis for smoking cessation  [employer] [phone]  Acupuncture for smoking cessation  [facility] [phone]   

## 2022-03-04 ENCOUNTER — Other Ambulatory Visit: Payer: Self-pay

## 2022-03-04 DIAGNOSIS — F17210 Nicotine dependence, cigarettes, uncomplicated: Secondary | ICD-10-CM

## 2022-03-04 DIAGNOSIS — Z122 Encounter for screening for malignant neoplasm of respiratory organs: Secondary | ICD-10-CM

## 2022-03-04 DIAGNOSIS — Z87891 Personal history of nicotine dependence: Secondary | ICD-10-CM

## 2022-03-04 DIAGNOSIS — G4733 Obstructive sleep apnea (adult) (pediatric): Secondary | ICD-10-CM | POA: Diagnosis not present

## 2022-03-09 ENCOUNTER — Ambulatory Visit (INDEPENDENT_AMBULATORY_CARE_PROVIDER_SITE_OTHER): Payer: 59 | Admitting: Internal Medicine

## 2022-03-09 ENCOUNTER — Encounter: Payer: Self-pay | Admitting: Internal Medicine

## 2022-03-09 VITALS — BP 97/64 | HR 74 | Temp 98.0°F | Wt 198.9 lb

## 2022-03-09 DIAGNOSIS — K219 Gastro-esophageal reflux disease without esophagitis: Secondary | ICD-10-CM | POA: Diagnosis not present

## 2022-03-09 DIAGNOSIS — G8929 Other chronic pain: Secondary | ICD-10-CM | POA: Diagnosis not present

## 2022-03-09 DIAGNOSIS — R7303 Prediabetes: Secondary | ICD-10-CM | POA: Diagnosis not present

## 2022-03-09 DIAGNOSIS — M25561 Pain in right knee: Secondary | ICD-10-CM | POA: Diagnosis not present

## 2022-03-09 LAB — POCT GLYCOSYLATED HEMOGLOBIN (HGB A1C): Hemoglobin A1C: 5.9 % — AB (ref 4.0–5.6)

## 2022-03-09 MED ORDER — PANTOPRAZOLE SODIUM 40 MG PO TBEC: 40.0000 mg | DELAYED_RELEASE_TABLET | Freq: Two times a day (BID) | ORAL | 1 refills | Status: DC

## 2022-03-09 NOTE — Progress Notes (Signed)
Established Patient Office Visit     CC/Reason for Visit: Discuss some acute concerns  HPI: Marisa Sherman is a 62 y.o. female who is coming in today for the above mentioned reasons.  Here today to discuss a few issues:  1.  She needs some forms filled out for transportation. 2.  She is here for an A1c to follow-up on her impaired glucose tolerance. 3.  She has been having increasing pain and swelling of her right knee, this is chronic. #4 she feels like her reflux is getting worse and is wondering if she can take her Protonix twice daily instead of once daily.   Past Medical/Surgical History: Past Medical History:  Diagnosis Date   Abnormal Pap smear of cervix    Allergy    Anxiety    Bipolar disorder (HCC)    Cataract    Complication of anesthesia    Degenerative disc disease at L5-S1 level    Depression    GERD (gastroesophageal reflux disease)    Gout    Hyperlipidemia    Hypertension    Overactive bladder    PONV (postoperative nausea and vomiting)     Past Surgical History:  Procedure Laterality Date   ANTERIOR CERVICAL DECOMP/DISCECTOMY FUSION N/A 03/05/2021   Procedure: ACDF - C5-C6 - C6-C7;  Surgeon: Earnie Larsson, MD;  Location: [facility];  Service: Neurosurgery;  Laterality: N/A;  3C   CARPAL TUNNEL RELEASE Bilateral    CERVIX LESION DESTRUCTION  1985   CHOLECYSTECTOMY     DILATION AND CURETTAGE OF UTERUS     EYE SURGERY Bilateral    cataract removal   TONSILLECTOMY     TUBAL LIGATION      Social History:  reports that she has been smoking cigarettes. She has a 48.00 pack-year smoking history. She has been exposed to tobacco smoke. She has never used smokeless tobacco. She reports that she does not currently use alcohol. She reports that she does not currently use drugs.  Allergies: Allergies  Allergen Reactions   Codeine Nausea Only   Diphenhydramine Hcl Other (See Comments)    Pt unsure why   Lithium Itching   Sulfa Antibiotics Nausea And Vomiting    Amoxicillin Nausea And Vomiting    Family History:  Family History  Problem Relation Age of Onset   Depression Mother    Anxiety disorder Mother    Asthma Mother    Bipolar disorder Father    Depression Sister    Anxiety disorder Sister    Depression Brother    Anxiety disorder Brother    Depression Brother    Anxiety disorder Brother    Cancer Neg Hx    Diabetes Neg Hx    Heart disease Neg Hx    Colon cancer Neg Hx    Breast cancer Neg Hx    Ovarian cancer Neg Hx    Cervical cancer Neg Hx    Colon polyps Neg Hx      Current Outpatient Medications:    acetaminophen (TYLENOL) 500 MG tablet, Take 1,000 mg by mouth every 6 (six) hours as needed for mild pain., Disp: , Rfl:    allopurinol (ZYLOPRIM) 100 MG tablet, TAKE 1 TABLET BY MOUTH DAILY, Disp: 100 tablet, Rfl: 1   aspirin EC 81 MG tablet, Take 81 mg by mouth every evening., Disp: , Rfl:    atorvastatin (LIPITOR) 40 MG tablet, TAKE 1 TABLET BY MOUTH DAILY AT  6 PM, Disp: 100 tablet, Rfl: 1  buPROPion (WELLBUTRIN XL) 150 MG 24 hr tablet, Take 1 tablet (150 mg total) by mouth daily., Disp: 90 tablet, Rfl: 1   FLUoxetine (PROZAC) 20 MG capsule, Take 3 capsules (60 mg total) by mouth daily., Disp: 90 capsule, Rfl: 4   hydrOXYzine (VISTARIL) 50 MG capsule, TAKE 1 CAPSULE BY MOUTH EVERY 8  HOURS AS NEEDED FOR ANXIETY, Disp: 180 capsule, Rfl: 2   lisinopril (ZESTRIL) 40 MG tablet, TAKE 1 TABLET BY MOUTH DAILY, Disp: 100 tablet, Rfl: 2   QUEtiapine Fumarate (SEROQUEL XR) 150 MG 24 hr tablet, Take 1 tablet (150 mg total) by mouth at bedtime., Disp: 30 tablet, Rfl: 11   sucralfate (CARAFATE) 1 g tablet, Take 1 g by mouth daily., Disp: , Rfl:    trazodone (DESYREL) 300 MG tablet, Take 1 tablet (300 mg total) by mouth at bedtime., Disp: 30 tablet, Rfl: 11   pantoprazole (PROTONIX) 40 MG tablet, Take 1 tablet (40 mg total) by mouth 2 (two) times daily., Disp: 180 tablet, Rfl: 1  Review of Systems:  Negative unless indicated in  HPI.   Physical Exam: Vitals:   03/09/22 1338  BP: 97/64  Pulse: 74  Temp: 98 F (36.7 C)  TempSrc: Oral  SpO2: 98%  Weight: 198 lb 14.4 oz (90.2 kg)    Body mass index is 32.1 kg/m.   Physical Exam Vitals reviewed.  Constitutional:      Appearance: Normal appearance.  HENT:     Head: Normocephalic and atraumatic.  Eyes:     Conjunctiva/sclera: Conjunctivae normal.     Pupils: Pupils are equal, round, and reactive to light.  Musculoskeletal:     Right knee: Swelling present.  Skin:    General: Skin is warm and dry.  Neurological:     General: No focal deficit present.     Mental Status: She is alert and oriented to person, place, and time.  Psychiatric:        Mood and Affect: Mood normal.        Behavior: Behavior normal.        Thought Content: Thought content normal.        Judgment: Judgment normal.      Impression and Plan:  Prediabetes - Plan: POC HgB A1c  Chronic pain of right knee - Plan: Ambulatory referral to Orthopedic Surgery  Gastroesophageal reflux disease, unspecified whether esophagitis present - Plan: pantoprazole (PROTONIX) 40 MG tablet  -A1c is 5.9, remains in prediabetic range. -I will increase PPI to twice daily.  If no improvement will need to consider GI referral. -Transportation forms will be filled out today. -It would appear that she has osteoarthritis and possibly a right knee effusion, referral to orthopedics has been placed.  Time spent:33 minutes reviewing chart, interviewing and examining patient and formulating plan of care.     Lelon Frohlich, MD [facility] at [facility]

## 2022-03-21 DIAGNOSIS — M1711 Unilateral primary osteoarthritis, right knee: Secondary | ICD-10-CM | POA: Diagnosis not present

## 2022-03-21 DIAGNOSIS — M170 Bilateral primary osteoarthritis of knee: Secondary | ICD-10-CM | POA: Diagnosis not present

## 2022-03-22 ENCOUNTER — Telehealth: Payer: Self-pay | Admitting: Internal Medicine

## 2022-03-22 ENCOUNTER — Other Ambulatory Visit: Payer: Self-pay

## 2022-03-22 DIAGNOSIS — G4733 Obstructive sleep apnea (adult) (pediatric): Secondary | ICD-10-CM

## 2022-03-22 NOTE — Telephone Encounter (Signed)
Dr. Annamaria Boots, please advise on the results of pt's recent sleep study.

## 2022-03-22 NOTE — Telephone Encounter (Signed)
Discussed HST results with patient. Order has been placed for CPAP machine. Nothing further needed at the time

## 2022-03-22 NOTE — Telephone Encounter (Signed)
Patient would like results of home sleep test. Patient phone number is [phone].

## 2022-03-22 NOTE — Telephone Encounter (Signed)
Sleep study showed mild obstructive sleep apnea, averaging 11 apneas/ hour.  We can try treating this with either CPAP or a fitted mouthpiece to see if it helps the daytime sleepiness she was concerned about. If she chooses to go ahead:  Order- new DME, new CPAP auto 5-15, mask of choice, humidifier, supplies, AirView/ card  We would need to see in in 31-90 days for follow-up.

## 2022-03-29 ENCOUNTER — Other Ambulatory Visit: Payer: Self-pay | Admitting: Internal Medicine

## 2022-03-29 DIAGNOSIS — F17210 Nicotine dependence, cigarettes, uncomplicated: Secondary | ICD-10-CM

## 2022-04-06 ENCOUNTER — Encounter: Payer: Medicare Other | Admitting: Internal Medicine

## 2022-04-07 ENCOUNTER — Encounter: Payer: Self-pay | Admitting: Internal Medicine

## 2022-04-11 ENCOUNTER — Encounter (HOSPITAL_COMMUNITY): Payer: Self-pay

## 2022-04-11 ENCOUNTER — Telehealth (HOSPITAL_COMMUNITY): Payer: 59 | Admitting: Student in an Organized Health Care Education/Training Program

## 2022-04-11 NOTE — Progress Notes (Deleted)
[place] MD/PA/NP OP Progress Note  04/11/2022 12:54 PM Marisa Sherman  MRN:  [id number]  Chief Complaint: No chief complaint on file.  HPI: Marisa Sherman is a 62 year old female with a past psychiatric history significant for substance-induced mood disorder, anxiety, insomnia, and PTSD  Patient has been compliant with the following medication regimen: Hydroxyzine 50 mg 3 times daily as needed Seroquel XR 150 mg nightly Prozac 60 mg Trazodone 300 mg nightly    Spending Sobriety Anxiety  Visit Diagnosis: No diagnosis found.  Past Psychiatric History: ***  Past Medical History:  Past Medical History:  Diagnosis Date   Abnormal Pap smear of cervix    Allergy    Anxiety    Bipolar disorder (HCC)    Cataract    Complication of anesthesia    Degenerative disc disease at L5-S1 level    Depression    GERD (gastroesophageal reflux disease)    Gout    Hyperlipidemia    Hypertension    Overactive bladder    PONV (postoperative nausea and vomiting)     Past Surgical History:  Procedure Laterality Date   ANTERIOR CERVICAL DECOMP/DISCECTOMY FUSION N/A 03/05/2021   Procedure: ACDF - C5-C6 - C6-C7;  Surgeon: Earnie Larsson, MD;  Location: [facility];  Service: Neurosurgery;  Laterality: N/A;  3C   CARPAL TUNNEL RELEASE Bilateral    CERVIX LESION DESTRUCTION  1985   CHOLECYSTECTOMY     DILATION AND CURETTAGE OF UTERUS     EYE SURGERY Bilateral    cataract removal   TONSILLECTOMY     TUBAL LIGATION      Family Psychiatric History: ***  Family History:  Family History  Problem Relation Age of Onset   Depression Mother    Anxiety disorder Mother    Asthma Mother    Bipolar disorder Father    Depression Sister    Anxiety disorder Sister    Depression Brother    Anxiety disorder Brother    Depression Brother    Anxiety disorder Brother    Cancer Neg Hx    Diabetes Neg Hx    Heart disease Neg Hx    Colon cancer Neg Hx    Breast cancer Neg Hx    Ovarian cancer Neg Hx    Cervical  cancer Neg Hx    Colon polyps Neg Hx     Social History:  Social History   Socioeconomic History   Marital status: Divorced    Spouse name: Not on file   Number of children: 3   Years of education: Not on file   Highest education level: GED or equivalent  Occupational History   Occupation: Disablilty  Tobacco Use   Smoking status: Every Day    Packs/day: 1.00    Years: 48.00    Additional pack years: 0.00    Total pack years: 48.00    Types: Cigarettes    Passive exposure: Current   Smokeless tobacco: Never   Tobacco comments:    Pt smokes 1 pack per day 12/30/21 PAP  Vaping Use   Vaping Use: Never used  Substance and Sexual Activity   Alcohol use: Not Currently   Drug use: Not Currently    Comment: 90 days recovery   Sexual activity: Not Currently  Other Topics Concern   Not on file  Social History Narrative   Not on file   Social Determinants of Health   Financial Resource Strain: Low Risk  (07/08/2020)   Overall Financial Resource Strain (CARDIA)  Difficulty of Paying Living Expenses: Not hard at all  Food Insecurity: No Food Insecurity (08/25/2020)   Hunger Vital Sign    Worried About Running Out of Food in the Last Year: Never true    Ran Out of Food in the Last Year: Never true  Transportation Needs: No Transportation Needs (08/25/2020)   PRAPARE - Hydrologist (Medical): No    Lack of Transportation (Non-Medical): No  Physical Activity: Sufficiently Active (07/08/2020)   Exercise Vital Sign    Days of Exercise per Week: 7 days    Minutes of Exercise per Session: 30 min  Stress: Stress Concern Present (07/08/2020)   [place]    Feeling of Stress : Rather much  Social Connections: Moderately Isolated (07/08/2020)   Social Connection and Isolation Panel [NHANES]    Frequency of Communication with Friends and Family: Twice a week    Frequency of Social Gatherings  with Friends and Family: Never    Attends Religious Services: 1 to 4 times per year    Active Member of [employer] or Organizations: Yes    Attends Music therapist: More than 4 times per year    Marital Status: Divorced    Allergies:  Allergies  Allergen Reactions   Codeine Nausea Only   Diphenhydramine Hcl Other (See Comments)    Pt unsure why   Lithium Itching   Sulfa Antibiotics Nausea And Vomiting   Amoxicillin Nausea And Vomiting    Metabolic Disorder Labs: Lab Results  Component Value Date   HGBA1C 5.9 (A) 03/09/2022   No results found for: "PROLACTIN" Lab Results  Component Value Date   CHOL 172 11/24/2021   TRIG 201.0 (H) 11/24/2021   HDL 39.40 11/24/2021   CHOLHDL 4 11/24/2021   VLDL 40.2 (H) 11/24/2021   LDLCALC 98 04/13/2020   LDLCALC 90 04/01/2019   Lab Results  Component Value Date   TSH 2.610 04/13/2020   TSH 1.720 07/05/2013    Therapeutic Level Labs: Lab Results  Component Value Date   LITHIUM 0.50 (L) 07/10/2013   LITHIUM <0.25 (L) 07/01/2013   No results found for: "VALPROATE" Lab Results  Component Value Date   CBMZ 7.6 07/10/2013    Current Medications: Current Outpatient Medications  Medication Sig Dispense Refill   acetaminophen (TYLENOL) 500 MG tablet Take 1,000 mg by mouth every 6 (six) hours as needed for mild pain.     allopurinol (ZYLOPRIM) 100 MG tablet TAKE 1 TABLET BY MOUTH DAILY 100 tablet 1   aspirin EC 81 MG tablet Take 81 mg by mouth every evening.     atorvastatin (LIPITOR) 40 MG tablet TAKE 1 TABLET BY MOUTH DAILY AT  6 PM 100 tablet 1   buPROPion (WELLBUTRIN XL) 150 MG 24 hr tablet TAKE 1 TABLET BY MOUTH DAILY 90 tablet 1   FLUoxetine (PROZAC) 20 MG capsule Take 3 capsules (60 mg total) by mouth daily. 90 capsule 4   hydrOXYzine (VISTARIL) 50 MG capsule TAKE 1 CAPSULE BY MOUTH EVERY 8  HOURS AS NEEDED FOR ANXIETY 180 capsule 2   lisinopril (ZESTRIL) 40 MG tablet TAKE 1 TABLET BY MOUTH DAILY 100 tablet 2    pantoprazole (PROTONIX) 40 MG tablet Take 1 tablet (40 mg total) by mouth 2 (two) times daily. 180 tablet 1   QUEtiapine Fumarate (SEROQUEL XR) 150 MG 24 hr tablet Take 1 tablet (150 mg total) by mouth at bedtime. 30 tablet 11  sucralfate (CARAFATE) 1 g tablet Take 1 g by mouth daily.     trazodone (DESYREL) 300 MG tablet Take 1 tablet (300 mg total) by mouth at bedtime. 30 tablet 11   No current facility-administered medications for this visit.     Musculoskeletal: Strength & Muscle Tone: {desc; muscle tone:32375} Gait & Station: {PE GAIT ED QX:[id number] Patient leans: {Patient Leans:21022755}  Psychiatric Specialty Exam: Review of Systems  There were no vitals taken for this visit.There is no height or weight on file to calculate BMI.  General Appearance: {Appearance:22683}  Eye Contact:  {BHH EYE CONTACT:22684}  Speech:  {Speech:22685}  Volume:  {Volume (PAA):22686}  Mood:  {BHH MOOD:22306}  Affect:  {Affect (PAA):22687}  Thought Process:  {Thought Process (PAA):22688}  Orientation:  {BHH ORIENTATION (PAA):22689}  Thought Content: {Thought Content:22690}   Suicidal Thoughts:  {ST/HT (PAA):22692}  Homicidal Thoughts:  {ST/HT (PAA):22692}  Memory:  {BHH MEMORY:22881}  Judgement:  {Judgement (PAA):22694}  Insight:  {Insight (PAA):22695}  Psychomotor Activity:  {Psychomotor (PAA):22696}  Concentration:  {Concentration:21399}  Recall:  {BHH GOOD/FAIR/POOR:22877}  Fund of Knowledge: {BHH GOOD/FAIR/POOR:22877}  Language: {BHH GOOD/FAIR/POOR:22877}  Akathisia:  {BHH YES OR NO:22294}  Handed:  {Handed:22697}  AIMS (if indicated): {Desc; done/not:10129}  Assets:  {Assets (PAA):22698}  ADL's:  {BHH TW:9249394  Cognition: {chl bhh cognition:304700322}  Sleep:  {BHH GOOD/FAIR/POOR:22877}   Screenings: AUDIT    Flowsheet Row Admission (Discharged) from 07/02/2013 in [facility]  Alcohol Use Disorder Identification Test Final Score (AUDIT) 0       GAD-7    Flowsheet Row Office Visit from 03/09/2022 in [facility] Video Visit from 10/13/2021 in [facility] Video Visit from 03/10/2021 in [facility] Video Visit from 01/06/2021 in [facility] Video Visit from 11/06/2020 in [facility]  Total GAD-7 Score 0 5 2 9 13       PHQ2-9    [facility] Office Visit from 03/09/2022 in [facility] [place] from 12/22/2021 in [facility] [place] from 11/24/2021 in [facility] [place] from 10/13/2021 in [facility] Office Visit from 07/01/2021 in [facility] [place]  PHQ-2 Total Score 0 0 0 1 0  PHQ-9 Total Score 0 4 6 -- 6      Flowsheet Row Video Visit from 10/13/2021 in [facility] Video Visit from 03/10/2021 in [facility] Admission (Discharged) from 03/05/2021 in [facility] No Risk No Risk        Assessment and Plan: ***  Collaboration of Care: Collaboration of Care: [facility] OP Collaboration of Care:[id number]}  Patient/Guardian was advised Release of Information must be obtained prior to any record release in order to collaborate their care with an outside provider. Patient/Guardian was advised if they have not already done so to contact the registration department to sign all necessary forms in order for [place] to release information regarding their care.   Consent: Patient/Guardian gives verbal consent for treatment and assignment of benefits for services provided during this visit. Patient/Guardian expressed understanding and agreed to proceed.    Freida Busman, MD 04/11/2022, 12:54 PM

## 2022-04-13 DIAGNOSIS — G4733 Obstructive sleep apnea (adult) (pediatric): Secondary | ICD-10-CM | POA: Diagnosis not present

## 2022-04-13 DIAGNOSIS — M7542 Impingement syndrome of left shoulder: Secondary | ICD-10-CM | POA: Diagnosis not present

## 2022-04-13 DIAGNOSIS — M7541 Impingement syndrome of right shoulder: Secondary | ICD-10-CM | POA: Diagnosis not present

## 2022-04-14 ENCOUNTER — Other Ambulatory Visit: Payer: Self-pay

## 2022-04-14 DIAGNOSIS — G4733 Obstructive sleep apnea (adult) (pediatric): Secondary | ICD-10-CM

## 2022-05-20 ENCOUNTER — Telehealth (HOSPITAL_COMMUNITY): Payer: Self-pay | Admitting: *Deleted

## 2022-05-20 NOTE — Telephone Encounter (Signed)
Faxed request for refill of Prozac 20mg . Chart reviewed, no future appointment scheduled. Was a no show for march appointment. Pharmacy notified that appointment will be needed for future refills.

## 2022-05-24 ENCOUNTER — Other Ambulatory Visit: Payer: Self-pay | Admitting: Internal Medicine

## 2022-05-24 DIAGNOSIS — K219 Gastro-esophageal reflux disease without esophagitis: Secondary | ICD-10-CM

## 2022-05-25 ENCOUNTER — Ambulatory Visit: Payer: 59

## 2022-05-25 DIAGNOSIS — G4733 Obstructive sleep apnea (adult) (pediatric): Secondary | ICD-10-CM | POA: Diagnosis not present

## 2022-05-30 DIAGNOSIS — M170 Bilateral primary osteoarthritis of knee: Secondary | ICD-10-CM | POA: Diagnosis not present

## 2022-06-02 ENCOUNTER — Telehealth: Payer: Self-pay

## 2022-06-02 DIAGNOSIS — M7541 Impingement syndrome of right shoulder: Secondary | ICD-10-CM | POA: Diagnosis not present

## 2022-06-02 DIAGNOSIS — M7542 Impingement syndrome of left shoulder: Secondary | ICD-10-CM | POA: Diagnosis not present

## 2022-06-02 NOTE — Telephone Encounter (Signed)
Unable to reach pt for PV. 3 attempts made. VM left for pt to call back to r/s her PV and colon by end of day or her procedures will be cancelled.

## 2022-06-02 NOTE — Telephone Encounter (Signed)
Dr. Leonides Schanz just making you aware I cancelled Marisa Sherman colonoscopy with you on 06/08/22. She was a no show for her PV today.

## 2022-06-02 NOTE — Telephone Encounter (Addendum)
PV attempted VM left

## 2022-06-08 ENCOUNTER — Encounter: Payer: 59 | Admitting: Internal Medicine

## 2022-06-19 ENCOUNTER — Other Ambulatory Visit: Payer: Self-pay | Admitting: Internal Medicine

## 2022-06-20 NOTE — Progress Notes (Deleted)
12/30/21- 61 yoF Smoker (40 pkyrs) for sleep evaluation courtesy of Dr Philip Aspen with concern of OSA Medical problem list includes Insomnia,  HTN, GERD, Peripheral Neuropathy, Cervical Spondylosis, Degenerative Disc Disease, Bipolar, PTSD,  - Prozac, Seroquel, Trazodone, Wellbutrin, Vistaril, Epworth score-1 Body weight today-192 lbs Covid vax-3 Moderna Flu vax- had Has been tired "all the time for years". Aware of loud snoring. Cites trazodone taken for sleep. Wakes frequently. Physically comfortable in bed. 2 cups of coffee and 2-3 soft drinks during day. Raised in [place] home with limited information about parents. Mother had COPD.  ENT- had laser surgery for tonsils.  06/21/22- 62 yoF Smoker (40 pkyrs) followed for OSA, complicated by Insomnia,  HTN, GERD, Peripheral Neuropathy, Cervical Spondylosis, Degenerative Disc Disease, Bipolar, PTSD, Aortic Atherosclerosis,  - Prozac, Seroquel, Trazodone, Wellbutrin, Vistaril, HST 02/27/22- AHI 11/ hr, desaturation to 72%, body weight 192 lbs CPAP auto 5-15/ [employer] new 03/22/22 Download compliance- Body weight today-  CT chest low dose screen 03/02/22- MPRESSION: Lung-RADS 1, negative. Continue annual screening with low-dose chest CT without contrast in 12 months. Aortic Atherosclerosis (ICD10-I70.0) and Emphysema (ICD10-J43.9).    ROS-see HPI  + = positive Constitutional:    weight loss, night sweats, fevers, chills, +fatigue, lassitude. HEENT:    headaches, difficulty swallowing, tooth/dental problems, sore throat,       sneezing, itching, ear ache, nasal congestion, post nasal drip, snoring CV:    chest pain, orthopnea, PND, swelling in lower extremities, anasarca,                                   dizziness, palpitations Resp:   shortness of breath with exertion or at rest.                productive cough,   non-productive cough, coughing up of blood.              change in color of mucus.  wheezing.   Skin:    rash or lesions. GI:   No-   heartburn, indigestion, abdominal pain, nausea, vomiting, diarrhea,                 change in bowel habits, loss of appetite GU: dysuria, change in color of urine, no urgency or frequency.   flank pain. MS:   joint pain, stiffness, decreased range of motion, back pain. Neuro-     nothing unusual Psych:  change in mood or affect.  depression or anxiety.   memory loss.   OBJ- Physical Exam General- Alert, Oriented, Affect-appropriate, Distress- none acute Skin- rash-none, lesions- none, excoriation- none Lymphadenopathy- none Head- atraumatic            Eyes- Gross vision intact, PERRLA, conjunctivae and secretions clear            Ears- Hearing, canals-normal            Nose- Clear, no-Septal dev, mucus, polyps, erosion, perforation             Throat- Mallampati III-IV , mucosa clear , drainage- none, tonsils-absent, +dentures Neck- flexible , trachea midline, no stridor , thyroid nl, carotid no bruit Chest - symmetrical excursion , unlabored           Heart/CV- RRR , no murmur , no gallop  , no rub, nl s1 s2                           -  JVD- none , edema- none, stasis changes- none, varices- none           Lung- clear to P&A, wheeze- none, cough- none , dullness-none, rub- none           Chest wall-  Abd-  Br/ Gen/ Rectal- Not done, not indicated Extrem- cyanosis- none, clubbing, none, atrophy- none, strength- nl Neuro- grossly intact to observation

## 2022-06-21 ENCOUNTER — Ambulatory Visit: Payer: 59 | Admitting: Internal Medicine

## 2022-06-27 ENCOUNTER — Ambulatory Visit (INDEPENDENT_AMBULATORY_CARE_PROVIDER_SITE_OTHER): Payer: 59 | Admitting: Internal Medicine

## 2022-06-27 ENCOUNTER — Encounter: Payer: Self-pay | Admitting: Internal Medicine

## 2022-06-27 VITALS — BP 120/88 | Temp 97.8°F | Wt 199.7 lb

## 2022-06-27 DIAGNOSIS — M62838 Other muscle spasm: Secondary | ICD-10-CM | POA: Diagnosis not present

## 2022-06-27 MED ORDER — METHOCARBAMOL 500 MG PO TABS: 500.0000 mg | ORAL_TABLET | Freq: Four times a day (QID) | ORAL | 2 refills | Status: AC

## 2022-06-27 NOTE — Progress Notes (Signed)
Established Patient Office Visit     CC/Reason for Visit: Neck and shoulder pain  HPI: Marisa Sherman is a 62 y.o. female who is coming in today for the above mentioned reasons.  She has had chronic neck and shoulder pain that she manages well with Robaxin.  She is requesting a refill of the same.   Past Medical/Surgical History: Past Medical History:  Diagnosis Date   Abnormal Pap smear of cervix    Allergy    Anxiety    Bipolar disorder (HCC)    Cataract    Complication of anesthesia    Degenerative disc disease at L5-S1 level    Depression    GERD (gastroesophageal reflux disease)    Gout    Hyperlipidemia    Hypertension    Overactive bladder    PONV (postoperative nausea and vomiting)     Past Surgical History:  Procedure Laterality Date   ANTERIOR CERVICAL DECOMP/DISCECTOMY FUSION N/A 03/05/2021   Procedure: ACDF - C5-C6 - C6-C7;  Surgeon: Julio Sicks, MD;  Location: [facility] OR;  Service: Neurosurgery;  Laterality: N/A;  3C   CARPAL TUNNEL RELEASE Bilateral    CERVIX LESION DESTRUCTION  1985   CHOLECYSTECTOMY     DILATION AND CURETTAGE OF UTERUS     EYE SURGERY Bilateral    cataract removal   TONSILLECTOMY     TUBAL LIGATION      Social History:  reports that she has been smoking cigarettes. She has a 48.00 pack-year smoking history. She has been exposed to tobacco smoke. She has never used smokeless tobacco. She reports that she does not currently use alcohol. She reports that she does not currently use drugs.  Allergies: Allergies  Allergen Reactions   Codeine Nausea Only   Diphenhydramine Hcl Other (See Comments)    Pt unsure why   Lithium Itching   Sulfa Antibiotics Nausea And Vomiting   Amoxicillin Nausea And Vomiting    Family History:  Family History  Problem Relation Age of Onset   Depression Mother    Anxiety disorder Mother    Asthma Mother    Bipolar disorder Father    Depression Sister    Anxiety disorder Sister    Depression Brother     Anxiety disorder Brother    Depression Brother    Anxiety disorder Brother    Cancer Neg Hx    Diabetes Neg Hx    Heart disease Neg Hx    Colon cancer Neg Hx    Breast cancer Neg Hx    Ovarian cancer Neg Hx    Cervical cancer Neg Hx    Colon polyps Neg Hx      Current Outpatient Medications:    acetaminophen (TYLENOL) 500 MG tablet, Take 1,000 mg by mouth every 6 (six) hours as needed for mild pain., Disp: , Rfl:    allopurinol (ZYLOPRIM) 100 MG tablet, TAKE 1 TABLET BY MOUTH DAILY, Disp: 100 tablet, Rfl: 1   aspirin EC 81 MG tablet, Take 81 mg by mouth every evening., Disp: , Rfl:    atorvastatin (LIPITOR) 40 MG tablet, TAKE 1 TABLET BY MOUTH DAILY AT  6 PM, Disp: 100 tablet, Rfl: 1   buPROPion (WELLBUTRIN XL) 150 MG 24 hr tablet, TAKE 1 TABLET BY MOUTH DAILY, Disp: 90 tablet, Rfl: 1   FLUoxetine (PROZAC) 20 MG capsule, Take 3 capsules (60 mg total) by mouth daily., Disp: 90 capsule, Rfl: 4   hydrOXYzine (VISTARIL) 50 MG capsule, TAKE 1 CAPSULE  BY MOUTH EVERY 8  HOURS AS NEEDED FOR ANXIETY, Disp: 180 capsule, Rfl: 2   lisinopril (ZESTRIL) 40 MG tablet, TAKE 1 TABLET BY MOUTH DAILY, Disp: 100 tablet, Rfl: 2   methocarbamol (ROBAXIN) 500 MG tablet, Take 1 tablet (500 mg total) by mouth 4 (four) times daily., Disp: 120 tablet, Rfl: 2   pantoprazole (PROTONIX) 40 MG tablet, TAKE 1 TABLET BY MOUTH TWICE  DAILY, Disp: 200 tablet, Rfl: 1   QUEtiapine Fumarate (SEROQUEL XR) 150 MG 24 hr tablet, Take 1 tablet (150 mg total) by mouth at bedtime., Disp: 30 tablet, Rfl: 11   sucralfate (CARAFATE) 1 g tablet, Take 1 g by mouth daily., Disp: , Rfl:    trazodone (DESYREL) 300 MG tablet, Take 1 tablet (300 mg total) by mouth at bedtime., Disp: 30 tablet, Rfl: 11  Review of Systems:  Negative unless indicated in HPI.   Physical Exam: Vitals:   06/27/22 0844  BP: 120/88  Temp: 97.8 F (36.6 C)  TempSrc: Oral  Weight: 199 lb 11.2 oz (90.6 kg)    Body mass index is 32.23  kg/m.   Physical Exam Vitals reviewed.  Constitutional:      Appearance: Normal appearance.  HENT:     Head: Normocephalic and atraumatic.  Eyes:     Conjunctiva/sclera: Conjunctivae normal.     Pupils: Pupils are equal, round, and reactive to light.  Cardiovascular:     Rate and Rhythm: Normal rate and regular rhythm.  Pulmonary:     Effort: Pulmonary effort is normal.     Breath sounds: Normal breath sounds.  Skin:    General: Skin is warm and dry.  Neurological:     General: No focal deficit present.     Mental Status: She is alert and oriented to person, place, and time.  Psychiatric:        Mood and Affect: Mood normal.        Behavior: Behavior normal.        Thought Content: Thought content normal.        Judgment: Judgment normal.      Impression and Plan:  Muscle spasms of neck -     Methocarbamol; Take 1 tablet (500 mg total) by mouth 4 (four) times daily.  Dispense: 120 tablet; Refill: 2  -Robaxin refilled per request.   Time spent:22 minutes reviewing chart, interviewing and examining patient and formulating plan of care.     Chaya Jan, MD [facility]

## 2022-07-14 DIAGNOSIS — G4733 Obstructive sleep apnea (adult) (pediatric): Secondary | ICD-10-CM | POA: Diagnosis not present

## 2022-07-21 ENCOUNTER — Other Ambulatory Visit: Payer: Self-pay | Admitting: Internal Medicine
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
## 2022-08-22 ENCOUNTER — Other Ambulatory Visit (HOSPITAL_COMMUNITY): Payer: Self-pay | Admitting: Student in an Organized Health Care Education/Training Program

## 2022-08-22 ENCOUNTER — Telehealth (HOSPITAL_COMMUNITY): Payer: Self-pay | Admitting: Student in an Organized Health Care Education/Training Program

## 2022-08-22 DIAGNOSIS — F4310 Post-traumatic stress disorder, unspecified: Secondary | ICD-10-CM

## 2022-08-22 DIAGNOSIS — F1994 Other psychoactive substance use, unspecified with psychoactive substance-induced mood disorder: Secondary | ICD-10-CM

## 2022-08-22 DIAGNOSIS — F419 Anxiety disorder, unspecified: Secondary | ICD-10-CM

## 2022-08-22 MED ORDER — HYDROXYZINE PAMOATE 50 MG PO CAPS: ORAL_CAPSULE | ORAL | 0 refills | Status: DC

## 2022-08-22 MED ORDER — FLUOXETINE HCL 20 MG PO CAPS: 60.0000 mg | ORAL_CAPSULE | Freq: Every day | ORAL | 0 refills | Status: DC

## 2022-08-22 NOTE — Telephone Encounter (Signed)
Done. thanks

## 2022-08-25 DIAGNOSIS — G4733 Obstructive sleep apnea (adult) (pediatric): Secondary | ICD-10-CM | POA: Diagnosis not present

## 2022-08-26 ENCOUNTER — Ambulatory Visit (INDEPENDENT_AMBULATORY_CARE_PROVIDER_SITE_OTHER): Payer: 59 | Admitting: Student in an Organized Health Care Education/Training Program

## 2022-08-26 ENCOUNTER — Encounter (HOSPITAL_COMMUNITY): Payer: Self-pay | Admitting: Student in an Organized Health Care Education/Training Program

## 2022-08-26 VITALS — BP 134/79 | HR 69 | Resp 16 | Wt 196.0 lb

## 2022-08-26 DIAGNOSIS — F1521 Other stimulant dependence, in remission: Secondary | ICD-10-CM | POA: Diagnosis not present

## 2022-08-26 DIAGNOSIS — F1491 Cocaine use, unspecified, in remission: Secondary | ICD-10-CM | POA: Diagnosis not present

## 2022-08-26 DIAGNOSIS — F419 Anxiety disorder, unspecified: Secondary | ICD-10-CM

## 2022-08-26 DIAGNOSIS — F1994 Other psychoactive substance use, unspecified with psychoactive substance-induced mood disorder: Secondary | ICD-10-CM | POA: Diagnosis not present

## 2022-08-26 DIAGNOSIS — G4700 Insomnia, unspecified: Secondary | ICD-10-CM

## 2022-08-26 DIAGNOSIS — F17200 Nicotine dependence, unspecified, uncomplicated: Secondary | ICD-10-CM

## 2022-08-26 DIAGNOSIS — F4310 Post-traumatic stress disorder, unspecified: Secondary | ICD-10-CM | POA: Diagnosis not present
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
## 2022-08-26 NOTE — Patient Instructions (Addendum)
Please talk to your PCP about getting an EKG in the setting of Seroquel XR. We would like to get an updated Qtc.  Fax Number:   [facility]: Fax Number: [phone], ATTn : Dr. Morrie Sheldon

## 2022-08-29 ENCOUNTER — Telehealth: Payer: Self-pay | Admitting: Internal Medicine

## 2022-08-29 NOTE — Telephone Encounter (Signed)
Pt calling to get a new prescription faxed over to [facility] Fax: [phone] Compliance Dept

## 2022-09-01 NOTE — Telephone Encounter (Signed)
Left message for patient to call and schedule an appointment and to clarify her compliance need.

## 2022-09-06 ENCOUNTER — Telehealth (HOSPITAL_COMMUNITY): Payer: Self-pay | Admitting: *Deleted

## 2022-09-06 NOTE — Telephone Encounter (Signed)
Fax received for refill request of Prozac 20mg . Next appointment 9/29.

## 2022-09-07 ENCOUNTER — Other Ambulatory Visit (HOSPITAL_COMMUNITY): Payer: Self-pay | Admitting: Student in an Organized Health Care Education/Training Program

## 2022-09-07 DIAGNOSIS — F419 Anxiety disorder, unspecified: Secondary | ICD-10-CM

## 2022-09-07 DIAGNOSIS — F4310 Post-traumatic stress disorder, unspecified: Secondary | ICD-10-CM

## 2022-09-07 DIAGNOSIS — F1994 Other psychoactive substance use, unspecified with psychoactive substance-induced mood disorder: Secondary | ICD-10-CM

## 2022-09-07 MED ORDER — FLUOXETINE HCL 20 MG PO CAPS: 60.0000 mg | ORAL_CAPSULE | Freq: Every day | ORAL | 1 refills | Status: DC

## 2022-09-07 NOTE — Telephone Encounter (Signed)
Done

## 2022-09-27 NOTE — Telephone Encounter (Signed)
[facility] faxed request for patients quetiapine xr and trazodone. Per chart she should be out in Nov.and has appt with Dr Gerlean Ren on 10/21/22.

## 2022-10-07 ENCOUNTER — Other Ambulatory Visit (HOSPITAL_COMMUNITY): Payer: Self-pay | Admitting: Student in an Organized Health Care Education/Training Program

## 2022-10-07 DIAGNOSIS — F1994 Other psychoactive substance use, unspecified with psychoactive substance-induced mood disorder: Secondary | ICD-10-CM

## 2022-10-07 DIAGNOSIS — G4700 Insomnia, unspecified: Secondary | ICD-10-CM

## 2022-10-07 DIAGNOSIS — F419 Anxiety disorder, unspecified: Secondary | ICD-10-CM

## 2022-10-07 MED ORDER — TRAZODONE HCL 300 MG PO TABS: 300.0000 mg | ORAL_TABLET | Freq: Every day | ORAL | 11 refills | Status: DC

## 2022-10-07 MED ORDER — QUETIAPINE FUMARATE ER 150 MG PO TB24: 150.0000 mg | ORAL_TABLET | Freq: Every day | ORAL | 11 refills | Status: DC

## 2022-10-07 NOTE — Progress Notes (Signed)
Received pharmacy fax to refill Seroquel and Trazodone.  PGY-4  Eliseo Gum, MD

## 2022-10-14 DIAGNOSIS — G4733 Obstructive sleep apnea (adult) (pediatric): Secondary | ICD-10-CM | POA: Diagnosis not present

## 2022-10-16 NOTE — Progress Notes (Deleted)
12/30/21- 61 yoF Smoker (40 pkyrs) for sleep evaluation courtesy of Dr Philip Aspen with concern of OSA Medical problem list includes Insomnia,  HTN, GERD, Peripheral Neuropathy, Cervical Spondylosis, Degenerative Disc Disease, Bipolar, PTSD,  - Prozac, Seroquel, Trazodone, Wellbutrin, Vistaril, Epworth score-1 Body weight today-192 lbs Covid vax-3 Moderna Flu vax- had Has been tired "all the time for years". Aware of loud snoring. Cites trazodone taken for sleep. Wakes frequently. Physically comfortable in bed. 2 cups of coffee and 2-3 soft drinks during day. Raised in [place] home with limited information about parents. Mother had COPD.  ENT- had laser surgery for tonsils.  10/18/22- 62 yoF Smoker followed for OSA, complicated by Insomnia,  HTN, GERD, Peripheral Neuropathy, Cervical Spondylosis, Degenerative Disc Disease, Bipolar, PTSD,  - Prozac, Seroquel, Trazodone, Wellbutrin, Vistaril, HST 02/27/22- AHI 11/hr, desaturation to 72%, body weight 192 lbs CPAP 5-15/ [facility]  new 03/22/22  Download compliance - 4.%, HI 0.7/hr     Has dentures  ROS-see HPI  + = positive Constitutional:    weight loss, night sweats, fevers, chills, +fatigue, lassitude. HEENT:    headaches, difficulty swallowing, tooth/dental problems, sore throat,       sneezing, itching, ear ache, nasal congestion, post nasal drip, snoring CV:    chest pain, orthopnea, PND, swelling in lower extremities, anasarca,                                   dizziness, palpitations Resp:   shortness of breath with exertion or at rest.                productive cough,   non-productive cough, coughing up of blood.              change in color of mucus.  wheezing.   Skin:    rash or lesions. GI:  No-   heartburn, indigestion, abdominal pain, nausea, vomiting, diarrhea,                 change in bowel habits, loss of appetite GU: dysuria, change in color of urine, no urgency or frequency.   flank pain. MS:   joint pain, stiffness,  decreased range of motion, back pain. Neuro-     nothing unusual Psych:  change in mood or affect.  depression or anxiety.   memory loss.   OBJ- Physical Exam General- Alert, Oriented, Affect-appropriate, Distress- none acute Skin- rash-none, lesions- none, excoriation- none Lymphadenopathy- none Head- atraumatic            Eyes- Gross vision intact, PERRLA, conjunctivae and secretions clear            Ears- Hearing, canals-normal            Nose- Clear, no-Septal dev, mucus, polyps, erosion, perforation             Throat- Mallampati III-IV , mucosa clear , drainage- none, tonsils-absent, +dentures Neck- flexible , trachea midline, no stridor , thyroid nl, carotid no bruit Chest - symmetrical excursion , unlabored           Heart/CV- RRR , no murmur , no gallop  , no rub, nl s1 s2                           - JVD- none , edema- none, stasis changes- none, varices- none  Lung- clear to P&A, wheeze- none, cough- none , dullness-none, rub- none           Chest wall-  Abd-  Br/ Gen/ Rectal- Not done, not indicated Extrem- cyanosis- none, clubbing, none, atrophy- none, strength- nl Neuro- grossly intact to observation

## 2022-10-18 ENCOUNTER — Ambulatory Visit: Payer: 59 | Admitting: Internal Medicine

## 2022-10-21 ENCOUNTER — Other Ambulatory Visit: Payer: Self-pay | Admitting: Internal Medicine

## 2022-10-21 ENCOUNTER — Encounter (HOSPITAL_COMMUNITY): Payer: 59 | Admitting: Student in an Organized Health Care Education/Training Program

## 2022-10-21 DIAGNOSIS — K219 Gastro-esophageal reflux disease without esophagitis: Secondary | ICD-10-CM

## 2022-10-27 ENCOUNTER — Telehealth (HOSPITAL_COMMUNITY): Payer: Self-pay

## 2022-10-27 ENCOUNTER — Other Ambulatory Visit (HOSPITAL_COMMUNITY): Payer: Self-pay | Admitting: Student in an Organized Health Care Education/Training Program

## 2022-10-27 DIAGNOSIS — F4310 Post-traumatic stress disorder, unspecified: Secondary | ICD-10-CM

## 2022-10-27 DIAGNOSIS — F1994 Other psychoactive substance use, unspecified with psychoactive substance-induced mood disorder: Secondary | ICD-10-CM

## 2022-10-27 DIAGNOSIS — F419 Anxiety disorder, unspecified: Secondary | ICD-10-CM

## 2022-10-27 MED ORDER — FLUOXETINE HCL 20 MG PO CAPS: 60.0000 mg | ORAL_CAPSULE | Freq: Every day | ORAL | 1 refills | Status: DC

## 2022-10-27 NOTE — Telephone Encounter (Signed)
Medication refill - Fax from [facility] requesting a new Fluoxetine 20 mg order,3 a day, #90. Last ordered 09/07/22 + 1 refill and patient last seen that date. Pt no showed 10/21/22 and has not rescheduled.
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
## 2022-10-28 ENCOUNTER — Encounter: Payer: Self-pay | Admitting: Internal Medicine

## 2022-10-31 DIAGNOSIS — A084 Viral intestinal infection, unspecified: Secondary | ICD-10-CM | POA: Diagnosis not present

## 2022-11-18 ENCOUNTER — Ambulatory Visit: Payer: 59 | Admitting: Family Medicine

## 2022-12-07 ENCOUNTER — Telehealth (HOSPITAL_COMMUNITY): Payer: Self-pay | Admitting: Student in an Organized Health Care Education/Training Program

## 2022-12-07 ENCOUNTER — Telehealth: Payer: Self-pay | Admitting: Internal Medicine

## 2022-12-07 ENCOUNTER — Other Ambulatory Visit: Payer: Self-pay | Admitting: Internal Medicine

## 2022-12-07 NOTE — Telephone Encounter (Signed)
methocarbamol (ROBAXIN) 500 MG tablet    [facility] [place] [place] - [address] Phone: [phone]  Fax: [phone]

## 2022-12-07 NOTE — Telephone Encounter (Signed)
Pt call and stated she want a pneumonia shot and want a call back she is 62.

## 2022-12-08 ENCOUNTER — Other Ambulatory Visit: Payer: Self-pay | Admitting: Student in an Organized Health Care Education/Training Program

## 2022-12-08 ENCOUNTER — Ambulatory Visit: Payer: 59

## 2022-12-08 DIAGNOSIS — F419 Anxiety disorder, unspecified: Secondary | ICD-10-CM

## 2022-12-08 MED ORDER — HYDROXYZINE PAMOATE 50 MG PO CAPS
ORAL_CAPSULE | ORAL | 0 refills | Status: DC
Start: 2022-12-08 — End: 2023-02-24

## 2022-12-08 MED ORDER — METHOCARBAMOL 500 MG PO TABS: 500.0000 mg | ORAL_TABLET | Freq: Four times a day (QID) | ORAL | 0 refills | Status: AC

## 2022-12-08 NOTE — Telephone Encounter (Signed)
Refill was sent

## 2022-12-08 NOTE — Telephone Encounter (Signed)
Left message on machine for patient to return our call 

## 2022-12-08 NOTE — Telephone Encounter (Signed)
Done, thanks

## 2022-12-13 NOTE — Telephone Encounter (Signed)
Left message on machine for patient to return our call 

## 2022-12-15 ENCOUNTER — Encounter: Payer: Self-pay | Admitting: *Deleted

## 2022-12-15 NOTE — Telephone Encounter (Signed)
After multiple times trying to reach the patient,  a message was placed in MyChart.

## 2022-12-16 ENCOUNTER — Encounter (HOSPITAL_COMMUNITY): Payer: 59 | Admitting: Student in an Organized Health Care Education/Training Program

## 2022-12-30 ENCOUNTER — Other Ambulatory Visit: Payer: Self-pay | Admitting: Internal Medicine

## 2022-12-30 DIAGNOSIS — I10 Essential (primary) hypertension: Secondary | ICD-10-CM

## 2022-12-30 DIAGNOSIS — K219 Gastro-esophageal reflux disease without esophagitis: Secondary | ICD-10-CM

## 2023-01-22 ENCOUNTER — Other Ambulatory Visit: Payer: Self-pay | Admitting: Acute Care

## 2023-01-22 DIAGNOSIS — F17210 Nicotine dependence, cigarettes, uncomplicated: Secondary | ICD-10-CM

## 2023-01-22 DIAGNOSIS — Z87891 Personal history of nicotine dependence: Secondary | ICD-10-CM

## 2023-01-22 DIAGNOSIS — Z122 Encounter for screening for malignant neoplasm of respiratory organs: Secondary | ICD-10-CM

## 2023-02-09 ENCOUNTER — Ambulatory Visit: Payer: 59 | Admitting: Internal Medicine

## 2023-02-10 ENCOUNTER — Encounter (HOSPITAL_COMMUNITY): Payer: Self-pay

## 2023-02-10 ENCOUNTER — Telehealth (HOSPITAL_COMMUNITY): Payer: 59 | Admitting: Student in an Organized Health Care Education/Training Program

## 2023-02-24 ENCOUNTER — Telehealth (HOSPITAL_COMMUNITY): Payer: 59 | Admitting: Student in an Organized Health Care Education/Training Program

## 2023-02-24 ENCOUNTER — Encounter (HOSPITAL_COMMUNITY): Payer: Self-pay | Admitting: Student in an Organized Health Care Education/Training Program

## 2023-02-24 DIAGNOSIS — F17210 Nicotine dependence, cigarettes, uncomplicated: Secondary | ICD-10-CM

## 2023-02-24 DIAGNOSIS — G4700 Insomnia, unspecified: Secondary | ICD-10-CM

## 2023-02-24 DIAGNOSIS — F1994 Other psychoactive substance use, unspecified with psychoactive substance-induced mood disorder: Secondary | ICD-10-CM

## 2023-02-24 DIAGNOSIS — F4310 Post-traumatic stress disorder, unspecified: Secondary | ICD-10-CM

## 2023-02-24 DIAGNOSIS — F419 Anxiety disorder, unspecified: Secondary | ICD-10-CM

## 2023-02-24 MED ORDER — HYDROXYZINE PAMOATE 50 MG PO CAPS: ORAL_CAPSULE | ORAL | 1 refills | Status: DC

## 2023-02-24 MED ORDER — FLUOXETINE HCL 20 MG PO CAPS: 60.0000 mg | ORAL_CAPSULE | Freq: Every day | ORAL | 1 refills | Status: DC

## 2023-02-24 MED ORDER — TRAZODONE HCL 150 MG PO TABS: 150.0000 mg | ORAL_TABLET | Freq: Every evening | ORAL | 1 refills | Status: DC | PRN

## 2023-02-24 NOTE — Progress Notes (Signed)
Virtual Visit via Video Note  I connected with Marisa Sherman on 02/24/23 at  8:00 AM EST by a video enabled telemedicine application and verified that I am speaking with the correct person using two identifiers.  Location: Patient: Home Provider: Office   I discussed the limitations of evaluation and management by telemedicine and the availability of in person appointments. The patient expressed understanding and agreed to proceed.    I discussed the assessment and treatment plan with the patient. The patient was provided an opportunity to ask questions and all were answered. The patient agreed with the plan and demonstrated an understanding of the instructions.   The patient was advised to call back or seek an in-person evaluation if the symptoms worsen or if the condition fails to improve as anticipated.  I provided 30 minutes of non-face-to-face time during this encounter.   Bobbye Morton, MD [facility] MD/PA/NP OP Progress Note  02/24/2023 8:44 AM Marisa Sherman  MRN:  [id number]  Chief Complaint:  Chief Complaint  Patient presents with   Follow-up   HPI: Marisa Sherman is a 63 year old female with a past psychiatric history significant for substance-induced mood disorder, anxiety, insomnia, and PTSD. Regimen, patient is compliant: Prozac 60mg  daily Hydroxyzine 50mg  TID PRN (taking 3 times) Wellbutrin XL 150mg  daily (prescribed by PCP for tobacco cessation, but not really interested in stopping, but she has not increased and is happy) Seroquel XR 150mg  at bedtime Trazodone 300mg  at bedtime Not using CPAP  PCP Dr. Ardyth Harps [facility]  Patient reports that she is doing well. Patient reports that she is now interested in smoking cessation, and is down to 10 cigs/ day which is less than 1ppd, where she was. Patient reports that she is having frequent night time awakenings. Patient reports that she stopped using her CPAP about 5 months ago, but she did not feel like she was sleeping  well with it as well. Patient reports that she has never really slept well even on the medication. Patient reports that she can go longest 2 hours straight with sleep and it takes 15-20 min to go back to sleep. Patient endorses having nightmares related to her trauma about 3-4 times/ week. Last night she had a dream about substance use.  Patient reports that she drinks no more than 2 cups of coffee, early in the AM. Patient reports she will drink 3-20oz soda's, with the last one time being around 8pm. Patient denies any episodes of having increased energy with decreased sleep, nor episodes of issues with impulse control. Patient endorses a good appetite.   Patient denies SI, HI, and AVH.  Patient reports that she has been sober from cocaine, meth, and THC for 3 years. Patient reports she does not drink Etoh. Patient reports that she is still going to NA 5x/ week and is still in the recovery house. Patient reports that she has been feeling more irritable and on edge, due to not getting enough sleep. Patient reports that she is still at [employer]  and has been there 2 years. Patient denies issues with concentration. Patient reports that she has some sad moments, because her brother died 2 weeks before Christmas. Patient reports that she will reach out to her sponsor, journal, or go for a walk when she feels down and this helps her feel better.     Visit Diagnosis:    ICD-10-CM   1. Insomnia, unspecified type  G47.00     2. Anxiety  F41.9  3. PTSD (post-traumatic stress disorder)  F43.10     4. Substance induced mood disorder (HCC)  F19.94     5. Cigarette nicotine dependence without complication  F17.210        Past Psychiatric History: Bipolar d/o, substance induced mood disorder, PTSD, anxiety. Was being seen at [facility]iat[facility] in [place] for past 4-5 years    Past Medical History:  Past Medical History:  Diagnosis Date   Abnormal Pap smear of cervix    Allergy    Anxiety     Bipolar disorder (HCC)    Cataract    Complication of anesthesia    Degenerative disc disease at L5-S1 level    Depression    GERD (gastroesophageal reflux disease)    Gout    Hyperlipidemia    Hypertension    Overactive bladder    PONV (postoperative nausea and vomiting)     Past Surgical History:  Procedure Laterality Date   ANTERIOR CERVICAL DECOMP/DISCECTOMY FUSION N/A 03/05/2021   Procedure: ACDF - C5-C6 - C6-C7;  Surgeon: Julio Sicks, MD;  Location: [facility] OR;  Service: Neurosurgery;  Laterality: N/A;  3C   CARPAL TUNNEL RELEASE Bilateral    CERVIX LESION DESTRUCTION  1985   CHOLECYSTECTOMY     DILATION AND CURETTAGE OF UTERUS     EYE SURGERY Bilateral    cataract removal   TONSILLECTOMY     TUBAL LIGATION      Family Psychiatric History: History of depression, anxiety in mother.  She also informed history of anxiety and depression in her siblings.  Father had a gambling issue.   Family History:  Family History  Problem Relation Age of Onset   Depression Mother    Anxiety disorder Mother    Asthma Mother    Bipolar disorder Father    Depression Sister    Anxiety disorder Sister    Depression Brother    Anxiety disorder Brother    Depression Brother    Anxiety disorder Brother    Cancer Neg Hx    Diabetes Neg Hx    Heart disease Neg Hx    Colon cancer Neg Hx    Breast cancer Neg Hx    Ovarian cancer Neg Hx    Cervical cancer Neg Hx    Colon polyps Neg Hx     Social History:  Social History   Socioeconomic History   Marital status: Divorced    Spouse name: Not on file   Number of children: 3   Years of education: Not on file   Highest education level: GED or equivalent  Occupational History   Occupation: Disablilty  Tobacco Use   Smoking status: Every Day    Current packs/day: 1.00    Average packs/day: 1 pack/day for 48.0 years (48.0 ttl pk-yrs)    Types: Cigarettes    Passive exposure: Current   Smokeless tobacco: Never   Tobacco comments:    Pt  smokes 1 pack per day 12/30/21 PAP  Vaping Use   Vaping status: Never Used  Substance and Sexual Activity   Alcohol use: Not Currently   Drug use: Not Currently    Comment: 90 days recovery   Sexual activity: Not Currently  Other Topics Concern   Not on file  Social History Narrative   Not on file   Social Drivers of Health   Financial Resource Strain: Low Risk  (07/08/2020)   Overall Financial Resource Strain (CARDIA)    Difficulty of Paying Living Expenses: Not hard  at all  Food Insecurity: No Food Insecurity (08/25/2020)   Hunger Vital Sign    Worried About Running Out of Food in the Last Year: Never true    Ran Out of Food in the Last Year: Never true  Transportation Needs: No Transportation Needs (08/25/2020)   PRAPARE - Administrator, Civil Service (Medical): No    Lack of Transportation (Non-Medical): No  Physical Activity: Sufficiently Active (07/08/2020)   Exercise Vital Sign    Days of Exercise per Week: 7 days    Minutes of Exercise per Session: 30 min  Stress: Stress Concern Present (07/08/2020)   [employer] of Occupational Health - Occupational Stress Questionnaire    Feeling of Stress : Rather much  Social Connections: Moderately Isolated (07/08/2020)   Social Connection and Isolation Panel [NHANES]    Frequency of Communication with Friends and Family: Twice a week    Frequency of Social Gatherings with Friends and Family: Never    Attends Religious Services: 1 to 4 times per year    Active Member of [employer] or Organizations: Yes    Attends Engineer, structural: More than 4 times per year    Marital Status: Divorced    Allergies:  Allergies  Allergen Reactions   Codeine Nausea Only   Diphenhydramine Hcl Other (See Comments)    Pt unsure why   Lithium Itching   Sulfa Antibiotics Nausea And Vomiting   Amoxicillin Nausea And Vomiting    Metabolic Disorder Labs: Lab Results  Component Value Date   HGBA1C 5.9 (A) 03/09/2022   No  results found for: "PROLACTIN" Lab Results  Component Value Date   CHOL 172 11/24/2021   TRIG 201.0 (H) 11/24/2021   HDL 39.40 11/24/2021   CHOLHDL 4 11/24/2021   VLDL 40.2 (H) 11/24/2021   LDLCALC 98 04/13/2020   LDLCALC 90 04/01/2019   Lab Results  Component Value Date   TSH 2.610 04/13/2020   TSH 1.720 07/05/2013    Therapeutic Level Labs: Lab Results  Component Value Date   LITHIUM 0.50 (L) 07/10/2013   LITHIUM <0.25 (L) 07/01/2013   No results found for: "VALPROATE" Lab Results  Component Value Date   CBMZ 7.6 07/10/2013    Current Medications: Current Outpatient Medications  Medication Sig Dispense Refill   acetaminophen (TYLENOL) 500 MG tablet Take 1,000 mg by mouth every 6 (six) hours as needed for mild pain.     allopurinol (ZYLOPRIM) 100 MG tablet TAKE 1 TABLET BY MOUTH DAILY 100 tablet 0   aspirin EC 81 MG tablet Take 81 mg by mouth every evening.     atorvastatin (LIPITOR) 40 MG tablet TAKE 1 TABLET BY MOUTH DAILY AT  6 PM 100 tablet 0   buPROPion (WELLBUTRIN XL) 150 MG 24 hr tablet TAKE 1 TABLET BY MOUTH DAILY 90 tablet 1   FLUoxetine (PROZAC) 20 MG capsule Take 3 capsules (60 mg total) by mouth daily. 90 capsule 1   hydrOXYzine (VISTARIL) 50 MG capsule TAKE 1 CAPSULE BY MOUTH EVERY 8  HOURS AS NEEDED FOR ANXIETY 180 capsule 0   lisinopril (ZESTRIL) 40 MG tablet TAKE 1 TABLET BY MOUTH DAILY 100 tablet 0   pantoprazole (PROTONIX) 40 MG tablet TAKE 1 TABLET BY MOUTH TWICE  DAILY 200 tablet 0   QUEtiapine Fumarate (SEROQUEL XR) 150 MG 24 hr tablet Take 1 tablet (150 mg total) by mouth at bedtime. 30 tablet 11   No current facility-administered medications for this visit.  Musculoskeletal: Strength & Muscle Tone: within normal limits Gait & Station: normal Patient leans: N/A  Psychiatric Specialty Exam: Review of Systems  Psychiatric/Behavioral:  Positive for sleep disturbance. Negative for decreased concentration, dysphoric mood, hallucinations and  suicidal ideas. The patient is not nervous/anxious.     There were no vitals taken for this visit.There is no height or weight on file to calculate BMI.  General Appearance: Casual  Eye Contact:  Good  Speech:  Clear and Coherent  Volume:  Normal  Mood:  Euthymic  Affect:  Appropriate and Congruent  Thought Process:  Coherent  Orientation:  Full (Time, Place, and Person)  Thought Content: Logical   Suicidal Thoughts:  No  Homicidal Thoughts:  No  Memory:  Immediate;   Good Recent;   Good  Judgement:  Good  Insight:  Good  Psychomotor Activity:  Normal  Concentration:  Concentration: Good  Recall:  Good  Fund of Knowledge: Good  Language: Good  Akathisia:  No  Handed:    AIMS (if indicated): not done  Assets:  Communication Skills Desire for Improvement Housing Leisure Time Resilience Social Support  ADL's:  Intact  Cognition: WNL  Sleep:  Poor   Screenings: AUDIT    Flowsheet Row [facility]  Alcohol Use Disorder Identification Test Final Score (AUDIT) 0      GAD-7    Flowsheet Row Office Visit from 03/09/2022 in [facility] [place] Video Visit from 10/13/2021 in [facility] Video Visit from 03/10/2021 in [facility] Video Visit from 01/06/2021 in [facility] Video Visit from 11/06/2020 in [facility]  Total GAD-7 Score 0 5 2 9 13       PHQ2-9    Flowsheet Row Office Visit from 06/27/2022 in [facility] [place] Office Visit from 03/09/2022 in [facility] [place] Office Visit from 12/22/2021 in [facility] [place] Office Visit from 11/24/2021 in [facility] [place] Video Visit from 10/13/2021 in [facility]  PHQ-2 Total Score 2 0 0 0 1   PHQ-9 Total Score 4 0 4 6 --      Flowsheet Row Video Visit from 10/13/2021 in [facility] Video Visit from 03/10/2021 in [facility] Admission (Discharged) from 03/05/2021 in [facility]  C-SSRS RISK CATEGORY Low Risk No Risk No Risk        Assessment and Plan: Patient will be making a new appt with her Neurology/ Sleep Medicine doctor, to be re-evaluated given her sleep issues. Also discussed that her night time caffeine intake may be contributing and patient agreed to try to not drink after 4pm, since she goes to bed around 10pm. Will also decrease trazodone, as the dose of 300mg  could be activating to patient. Will continue other medications, as patient is having irritability, but thinks this is related to insomnia, and is otherwise functioning ok in her life maintaining a job and going to her meetings. Discussed with patient that Seroquel is not indicated for insomnia alone, but may be helping Prozac to control mood as does sobriety. Also discussed that Seroquel has increased side effect risk profile with increased doses.   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  Collaboration of Care: Collaboration of Care:   Patient/Guardian was advised Release of Information must be obtained prior to any record release in order to collaborate their care with an outside provider. Patient/Guardian  was advised if they have not already done so to contact the registration department to sign all necessary forms in order for [place] to release information regarding their care.   Consent: Patient/Guardian gives verbal consent for treatment and assignment of benefits for services provided during this visit. Patient/Guardian expressed understanding and agreed to proceed.   PGY-4 Bobbye Morton, MD 02/24/2023, 8:44 AM

## 2023-03-01 ENCOUNTER — Ambulatory Visit: Payer: Self-pay | Admitting: Internal Medicine

## 2023-03-01 NOTE — Telephone Encounter (Signed)
 Copied from CRM [id number]. Topic: Clinical - Red Word Triage >> Mar 01, 2023  1:43 PM Corean SAUNDERS wrote: Patient states she is having severe back and neck pain and can't stand up completely.  Chief Complaint: Back pain Symptoms: Pain Frequency: 1-2 days ago Pertinent Negatives: Patient denies relief Disposition: [] ED /[] Urgent Care (no appt availability in office) / [x] Appointment(In office/virtual)/ []  [facility]/ [] Home Care/ [x] Refused Recommended Disposition /[] [facility]/ []  Follow-up with PCP Additional Notes: Patient called in to report a new onset of back pain. Patient stated she went to the nail salon two days ago and her back got aggravated after sitting in the massage chair for a pedicure. Patient stated the pain is located in the middle of her back mostly towards the right side. Patient rated the pain currently at an 8 and stated the pain makes it hard to stand up straight. Patient stated pain is also present in her neck and shoulders, but this was present before the back pain. Patient denied numbness and tingling in other parts of her body. Patient has been taking Motrin  and icing her back, but denied relief. This RN advised patient to be seen within 4 hours. No availability in the office or at another office within reasonable driving distance. Patient advised that she would not be able to make an appointment today anyway because she would have a hard time arraigning transportation. Patient is requesting an appointment in the office tomorrow. This RN advised that I would route conversation to the office, for their discretion with scheduling. Patient is also requesting her flu and pneumonia shot at the same visit. Patient is requesting a call back at [phone]. Advised patient to call back if symptoms worsen. Patient complied.   Reason for Disposition  [1] SEVERE back pain (e.g., excruciating, unable to do any normal activities) AND [2] not improved 2 hours after  pain medicine  Answer Assessment - Initial Assessment Questions 1. ONSET: When did the pain begin?      The day before yesterday after she left the nail salon 2. LOCATION: Where does it hurt? (upper, mid or lower back)     Middle of back on the right side 3. SEVERITY: How bad is the pain?  (e.g., Scale 1-10; mild, moderate, or severe)   - MILD (1-3): Doesn't interfere with normal activities.    - MODERATE (4-7): Interferes with normal activities or awakens from sleep.    - SEVERE (8-10): Excruciating pain, unable to do any normal activities.      States she can barely stand up straight, rates pain about an 8 right now, states she gets relief when laying down 4. PATTERN: Is the pain constant? (e.g., yes, no; constant, intermittent)      Constant 5. RADIATION: Does the pain shoot into your legs or somewhere else?     States she has pain in her neck and shoulders, states pain in her neck and shoulders was present before back pain 6. CAUSE:  What do you think is causing the back pain?      States she was at the nail salon and her back got aggravated after sitting in the massage chair 7. BACK OVERUSE:  Any recent lifting of heavy objects, strenuous work or exercise?     Denies 8. MEDICINES: What have you taken so far for the pain? (e.g., nothing, acetaminophen , NSAIDS)     Motrin  and ice 9. NEUROLOGIC SYMPTOMS: Do you have any weakness, numbness, or problems with bowel/bladder  control?     Denies numbness and tingling 10. OTHER SYMPTOMS: Do you have any other symptoms? (e.g., fever, abdomen pain, burning with urination, blood in urine)     Denies  Protocols used: Back Pain-A-AH

## 2023-03-01 NOTE — Telephone Encounter (Signed)
I offered the patient an appointment with Dr Ardyth Harps 03/02/23 at 7:30 am.  The patient declined because she has to work until 1:30 pm.  Appointment scheduled with Dr Salomon Fick.

## 2023-03-02 ENCOUNTER — Encounter: Payer: Self-pay | Admitting: Family Medicine

## 2023-03-02 ENCOUNTER — Ambulatory Visit (INDEPENDENT_AMBULATORY_CARE_PROVIDER_SITE_OTHER): Payer: 59 | Admitting: Family Medicine

## 2023-03-02 VITALS — BP 138/76 | HR 72 | Temp 97.9°F | Ht 66.0 in | Wt 198.4 lb

## 2023-03-02 DIAGNOSIS — M545 Low back pain, unspecified: Secondary | ICD-10-CM | POA: Diagnosis not present

## 2023-03-02 DIAGNOSIS — M542 Cervicalgia: Secondary | ICD-10-CM | POA: Diagnosis not present

## 2023-03-02 MED ORDER — CYCLOBENZAPRINE HCL 5 MG PO TABS: 5.0000 mg | ORAL_TABLET | Freq: Three times a day (TID) | ORAL | 0 refills | Status: DC | PRN

## 2023-03-02 NOTE — Patient Instructions (Addendum)
 Do not forget to reach out to your surgeon regarding continued symptoms since procedure.  It appears her surgeon was Dr. Victory Boers.  For continued or worsening symptoms you can also go to the emergency walk-in orthopedic clinic at the local orthopedic offices.  You can call or look online for specific hours.  A prescription for Flexeril  was sent to your pharmacy.  This is a muscle relaxer.  Please note that muscle relaxers can cause drowsiness.  If this is the case consider only taking the medication at night if needed.  Stretching exercises for your back were also listed.
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
## 2023-03-06 ENCOUNTER — Inpatient Hospital Stay: Admission: RE | Admit: 2023-03-06 | Payer: 59 | Source: Ambulatory Visit

## 2023-03-10 ENCOUNTER — Other Ambulatory Visit: Payer: Self-pay | Admitting: Internal Medicine

## 2023-03-10 DIAGNOSIS — K219 Gastro-esophageal reflux disease without esophagitis: Secondary | ICD-10-CM

## 2023-03-10 DIAGNOSIS — I10 Essential (primary) hypertension: Secondary | ICD-10-CM

## 2023-03-13 ENCOUNTER — Telehealth (HOSPITAL_COMMUNITY): Payer: Self-pay

## 2023-03-13 NOTE — Telephone Encounter (Signed)
Hello,      Pt/ Pharmacy  is requesting a refill of 90 tablets :FLUoxetine (PROZAC) 20 MG capsule  and   traZODone (DESYREL) 150 MG tablet :  30 tablets.   Sorry about that are you able to view this message ?

## 2023-03-13 NOTE — Telephone Encounter (Signed)
Hello,    Pt/ Pharmacy  is requesting a refill of 90 tablets :FLUoxetine (PROZAC) 20 MG capsule  and   traZODone (DESYREL) 150 MG tablet :  30 tablets

## 2023-03-13 NOTE — Telephone Encounter (Signed)
I can't see anything attached.

## 2023-03-16 ENCOUNTER — Telehealth (HOSPITAL_COMMUNITY): Payer: Self-pay | Admitting: *Deleted

## 2023-03-16 NOTE — Telephone Encounter (Signed)
Rx request from [facility] for prozac and desyrel. Reviewed chart, should have enough meds till end of March but I know [facility] is mail order and starts filling almost a month early. She however has a appt on March 7 with her provider and rx will be addressed on that day.

## 2023-03-17 ENCOUNTER — Other Ambulatory Visit (HOSPITAL_COMMUNITY): Payer: Self-pay | Admitting: Student in an Organized Health Care Education/Training Program

## 2023-03-17 DIAGNOSIS — F4310 Post-traumatic stress disorder, unspecified: Secondary | ICD-10-CM

## 2023-03-17 DIAGNOSIS — F1994 Other psychoactive substance use, unspecified with psychoactive substance-induced mood disorder: Secondary | ICD-10-CM

## 2023-03-17 DIAGNOSIS — F419 Anxiety disorder, unspecified: Secondary | ICD-10-CM

## 2023-03-17 DIAGNOSIS — G4700 Insomnia, unspecified: Secondary | ICD-10-CM

## 2023-03-17 MED ORDER — FLUOXETINE HCL 20 MG PO CAPS: 60.0000 mg | ORAL_CAPSULE | Freq: Every day | ORAL | 1 refills | Status: DC

## 2023-03-17 MED ORDER — TRAZODONE HCL 150 MG PO TABS: 150.0000 mg | ORAL_TABLET | Freq: Every evening | ORAL | 1 refills | Status: DC | PRN

## 2023-03-17 NOTE — Telephone Encounter (Signed)
Yes, I can see now thanks. I sent the refills in as requested.

## 2023-03-22 ENCOUNTER — Telehealth: Payer: Self-pay | Admitting: Internal Medicine

## 2023-03-22 DIAGNOSIS — G478 Other sleep disorders: Secondary | ICD-10-CM

## 2023-03-22 DIAGNOSIS — G4730 Sleep apnea, unspecified: Secondary | ICD-10-CM

## 2023-03-22 DIAGNOSIS — R0683 Snoring: Secondary | ICD-10-CM

## 2023-03-22 NOTE — Addendum Note (Signed)
 Addended by: Kern Reap B on: 03/22/2023 01:18 PM   Modules accepted: Orders

## 2023-03-22 NOTE — Telephone Encounter (Signed)
 Spoke to patient and referral placed.

## 2023-03-22 NOTE — Telephone Encounter (Signed)
 Copied from CRM [id number]. Topic: Referral - Request for Referral >> Mar 22, 2023  9:29 AM Drema Balzarine wrote: Did the patient discuss referral with their provider in the last year? Yes (If No - schedule appointment) (If Yes - send message)  Appointment offered? No  Type of order/referral and detailed reason for visit: Sleep Apnea   Preference of office, provider, location: [facility]  If referral order, have you been seen by this specialty before? Yes, but machine was sent back and she wants to give it another chance since she's snores all night  (If Yes, this issue or another issue? When? Where?  Can we respond through MyChart? Yes

## 2023-03-29 ENCOUNTER — Ambulatory Visit (INDEPENDENT_AMBULATORY_CARE_PROVIDER_SITE_OTHER): Admitting: Clinical

## 2023-03-29 ENCOUNTER — Encounter (HOSPITAL_COMMUNITY): Payer: Self-pay

## 2023-03-29 DIAGNOSIS — F4321 Adjustment disorder with depressed mood: Secondary | ICD-10-CM

## 2023-03-29 DIAGNOSIS — F411 Generalized anxiety disorder: Secondary | ICD-10-CM | POA: Diagnosis not present
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
## 2023-03-31 ENCOUNTER — Telehealth (HOSPITAL_COMMUNITY): Payer: Self-pay | Admitting: Student in an Organized Health Care Education/Training Program

## 2023-03-31 ENCOUNTER — Encounter (HOSPITAL_COMMUNITY): Payer: Self-pay

## 2023-03-31 NOTE — Progress Notes (Signed)
 Attempted to call pate x2 and started video call. Pt did not answer either call.   PGY-4 Eliseo Gum, MD

## 2023-04-06 ENCOUNTER — Other Ambulatory Visit: Payer: Self-pay | Admitting: Internal Medicine

## 2023-04-06 DIAGNOSIS — F17210 Nicotine dependence, cigarettes, uncomplicated: Secondary | ICD-10-CM

## 2023-04-14 ENCOUNTER — Other Ambulatory Visit (HOSPITAL_COMMUNITY): Payer: Self-pay | Admitting: Student in an Organized Health Care Education/Training Program

## 2023-04-14 ENCOUNTER — Telehealth (HOSPITAL_COMMUNITY): Payer: Self-pay

## 2023-04-14 DIAGNOSIS — G4700 Insomnia, unspecified: Secondary | ICD-10-CM

## 2023-04-14 DIAGNOSIS — F1994 Other psychoactive substance use, unspecified with psychoactive substance-induced mood disorder: Secondary | ICD-10-CM

## 2023-04-14 DIAGNOSIS — F4310 Post-traumatic stress disorder, unspecified: Secondary | ICD-10-CM

## 2023-04-14 DIAGNOSIS — F419 Anxiety disorder, unspecified: Secondary | ICD-10-CM

## 2023-04-14 MED ORDER — FLUOXETINE HCL 20 MG PO CAPS: 60.0000 mg | ORAL_CAPSULE | Freq: Every day | ORAL | 1 refills | Status: DC

## 2023-04-14 MED ORDER — TRAZODONE HCL 150 MG PO TABS: 150.0000 mg | ORAL_TABLET | Freq: Every evening | ORAL | 1 refills | Status: DC | PRN

## 2023-04-14 NOTE — Telephone Encounter (Addendum)
 Hello,      Pt/ Pharmacy  is requesting a refill of 90 tablets :FLUoxetine (PROZAC) 20 MG capsule  and   traZODone (DESYREL) 150 MG tablet : 30 tablets   Pt cancelled on 03/31/2023

## 2023-04-14 NOTE — Telephone Encounter (Signed)
 Done, thanks

## 2023-04-27 ENCOUNTER — Encounter (HOSPITAL_COMMUNITY): Payer: Self-pay

## 2023-04-27 ENCOUNTER — Ambulatory Visit (HOSPITAL_COMMUNITY): Admitting: Clinical

## 2023-05-18 ENCOUNTER — Ambulatory Visit: Payer: 59 | Admitting: Internal Medicine

## 2023-05-19 ENCOUNTER — Other Ambulatory Visit: Payer: Self-pay | Admitting: Internal Medicine

## 2023-05-19 DIAGNOSIS — K219 Gastro-esophageal reflux disease without esophagitis: Secondary | ICD-10-CM

## 2023-06-02 ENCOUNTER — Telehealth (HOSPITAL_COMMUNITY): Payer: Self-pay

## 2023-06-02 ENCOUNTER — Other Ambulatory Visit (HOSPITAL_COMMUNITY): Payer: Self-pay | Admitting: Student in an Organized Health Care Education/Training Program

## 2023-06-02 DIAGNOSIS — F419 Anxiety disorder, unspecified: Secondary | ICD-10-CM

## 2023-06-02 DIAGNOSIS — G4700 Insomnia, unspecified: Secondary | ICD-10-CM

## 2023-06-02 DIAGNOSIS — F1994 Other psychoactive substance use, unspecified with psychoactive substance-induced mood disorder: Secondary | ICD-10-CM

## 2023-06-02 DIAGNOSIS — F4310 Post-traumatic stress disorder, unspecified: Secondary | ICD-10-CM

## 2023-06-02 MED ORDER — TRAZODONE HCL 150 MG PO TABS: 150.0000 mg | ORAL_TABLET | Freq: Every evening | ORAL | 0 refills | Status: DC | PRN

## 2023-06-02 MED ORDER — FLUOXETINE HCL 20 MG PO CAPS: 60.0000 mg | ORAL_CAPSULE | Freq: Every day | ORAL | 0 refills | Status: DC

## 2023-06-02 NOTE — Telephone Encounter (Signed)
 Hello,      Pt/ Pharmacy  is requesting a refill of 90 tablets :FLUoxetine  (PROZAC ) 20 MG capsule  and   traZODone  (DESYREL ) 150 MG tablet : 30 tablets   Pt N/S  on 03/31/2023 no future apps

## 2023-06-02 NOTE — Telephone Encounter (Signed)
 Thank you, 1 month supply sent and the following message sent: "Pt will need to make appt before any future refills."

## 2023-06-15 ENCOUNTER — Other Ambulatory Visit: Payer: Self-pay

## 2023-06-15 ENCOUNTER — Emergency Department (HOSPITAL_COMMUNITY): Admission: EM | Admit: 2023-06-15 | Discharge: 2023-06-15 | Disposition: A | Discharge status: Home / Self Care

## 2023-06-15 ENCOUNTER — Emergency Department (HOSPITAL_COMMUNITY)

## 2023-06-15 ENCOUNTER — Ambulatory Visit: Payer: Self-pay

## 2023-06-15 ENCOUNTER — Other Ambulatory Visit: Payer: Self-pay | Admitting: Internal Medicine

## 2023-06-15 ENCOUNTER — Encounter (HOSPITAL_COMMUNITY): Payer: Self-pay | Admitting: Emergency Medicine

## 2023-06-15 DIAGNOSIS — Z4789 Encounter for other orthopedic aftercare: Secondary | ICD-10-CM | POA: Diagnosis not present

## 2023-06-15 DIAGNOSIS — G8929 Other chronic pain: Secondary | ICD-10-CM | POA: Diagnosis not present

## 2023-06-15 DIAGNOSIS — Z7982 Long term (current) use of aspirin: Secondary | ICD-10-CM | POA: Insufficient documentation

## 2023-06-15 DIAGNOSIS — M19011 Primary osteoarthritis, right shoulder: Secondary | ICD-10-CM | POA: Insufficient documentation

## 2023-06-15 DIAGNOSIS — I10 Essential (primary) hypertension: Secondary | ICD-10-CM

## 2023-06-15 DIAGNOSIS — Z981 Arthrodesis status: Secondary | ICD-10-CM | POA: Diagnosis not present

## 2023-06-15 DIAGNOSIS — M542 Cervicalgia: Secondary | ICD-10-CM | POA: Diagnosis not present

## 2023-06-15 DIAGNOSIS — M25511 Pain in right shoulder: Secondary | ICD-10-CM | POA: Diagnosis not present

## 2023-06-15 MED ORDER — METHOCARBAMOL 500 MG PO TABS
500.0000 mg | ORAL_TABLET | Freq: Once | ORAL | Status: AC
  Administered 2023-06-15: 500 mg via ORAL
  Filled 2023-06-15: qty 1

## 2023-06-15 MED ORDER — HYDROCODONE-ACETAMINOPHEN 5-325 MG PO TABS
1.0000 | ORAL_TABLET | Freq: Once | ORAL | Status: AC
  Administered 2023-06-15: 1 via ORAL
  Filled 2023-06-15: qty 1

## 2023-06-15 MED ORDER — METHOCARBAMOL 500 MG PO TABS: 500.0000 mg | ORAL_TABLET | Freq: Two times a day (BID) | ORAL | 0 refills | Status: AC

## 2023-06-15 MED ORDER — HYDROCODONE-ACETAMINOPHEN 5-325 MG PO TABS: 1.0000 | ORAL_TABLET | ORAL | 0 refills | Status: AC | PRN

## 2023-06-15 NOTE — Discharge Instructions (Addendum)
 Follow up with your Physician for recheck

## 2023-06-15 NOTE — ED Provider Notes (Signed)
 [place] EMERGENCY DEPARTMENT AT [facility] Provider Note   CSN: [id number] Arrival date & time: 06/15/23  1122     History  Chief Complaint  Patient presents with   Neck Pain   Shoulder Pain    Marisa Sherman is a 63 y.o. female.  Patient complains of pain in her neck into her right shoulder.  Patient reports that she has pain when she tries to lift her right shoulder.  Patient reports that she has had decreased use of her right shoulder.  Patient states that she has a history of spinal stenosis.  Patient had surgery in 2023 by Dr. Gwendlyn Lemmings.  Patient reports that she has been having the neck pain and shoulder pain for the past 5 years.  She thought when she had shoulder surgery that it would go away.  Patient reports that pain has continued to increase.  She called her primary care doctor's office today to be seen for help with pain management and was advised to come to the emergency department.  Patient is upset that her doctor would not help her and that she is here in the emergency department.  Patient states that there has to be something wrong and she is hoping for answers.  Patient denies any new area of pain she denies any new weakness.  She has not had any headache.  Patient denies any fever or chills   Neck Pain Shoulder Pain Associated symptoms: neck pain        Home Medications Prior to Admission medications   Medication Sig Start Date End Date Taking? Authorizing Provider  acetaminophen  (TYLENOL ) 500 MG tablet Take 1,000 mg by mouth every 6 (six) hours as needed for mild pain.    [provider]  allopurinol  (ZYLOPRIM ) 100 MG tablet TAKE 1 TABLET BY MOUTH DAILY 05/22/23   Zilphia Hilt, Charyl Coppersmith, MD  aspirin  EC 81 MG tablet Take 81 mg by mouth every evening.    [provider]  atorvastatin  (LIPITOR) 40 MG tablet TAKE 1 TABLET BY MOUTH DAILY AT  6 PM 05/22/23   Zilphia Hilt, Charyl Coppersmith, MD  buPROPion  (WELLBUTRIN  XL) 150 MG 24 hr tablet  TAKE 1 TABLET BY MOUTH DAILY 04/06/23   Zilphia Hilt, Charyl Coppersmith, MD  cyclobenzaprine  (FLEXERIL ) 5 MG tablet Take 1 tablet (5 mg total) by mouth 3 (three) times daily as needed for muscle spasms. 03/02/23   Viola Greulich, MD  FLUoxetine  (PROZAC ) 20 MG capsule Take 3 capsules (60 mg total) by mouth daily. 06/02/23 06/01/24  McQuilla, Jai B, MD  hydrOXYzine  (VISTARIL ) 50 MG capsule TAKE 1 CAPSULE BY MOUTH EVERY 8  HOURS AS NEEDED FOR ANXIETY 02/24/23   McQuilla, Jai B, MD  lisinopril  (ZESTRIL ) 40 MG tablet TAKE 1 TABLET BY MOUTH DAILY 03/13/23   Zilphia Hilt, Charyl Coppersmith, MD  pantoprazole  (PROTONIX ) 40 MG tablet TAKE 1 TABLET BY MOUTH TWICE  DAILY 05/22/23   Zilphia Hilt, Charyl Coppersmith, MD  QUEtiapine  Fumarate (SEROQUEL  XR) 150 MG 24 hr tablet Take 1 tablet (150 mg total) by mouth at bedtime. 10/07/22   McQuilla, Jai B, MD  traZODone  (DESYREL ) 150 MG tablet Take 1 tablet (150 mg total) by mouth at bedtime as needed for sleep. 06/02/23   McQuilla, Jai B, MD      Allergies    Codeine, Diphenhydramine hcl, Lithium , Sulfa antibiotics, and Amoxicillin     Review of Systems   Review of Systems  Musculoskeletal:  Positive for neck pain.  All other  systems reviewed and are negative.   Physical Exam Updated Vital Signs BP 129/86 (BP Location: Left Arm)   Pulse 78   Temp 98.1 F (36.7 C) (Oral)   Resp 20   SpO2 98%  Physical Exam Vitals and nursing note reviewed.  Constitutional:      Appearance: She is well-developed.  HENT:     Head: Normocephalic.  Neck:     Comments: Tender right lateral sternocleidomastoid area, good range of motion cervical spine.  Decreased abduction right shoulder. Pulmonary:     Effort: Pulmonary effort is normal.  Abdominal:     General: There is no distension.  Musculoskeletal:        General: Normal range of motion.     Cervical back: Tenderness present.  Neurological:     Mental Status: She is alert and oriented to person, place, and time.     ED Results /  Procedures / Treatments   Labs (all labs ordered are listed, but only abnormal results are displayed) Labs Reviewed - No data to display  EKG None  Radiology DG Shoulder Right Result Date: 06/15/2023 CLINICAL DATA:  Neck pain shoulder pain EXAM: RIGHT SHOULDER - 2+ VIEW COMPARISON:  None Available. FINDINGS: No fracture or malalignment. Mild AC joint degenerative change. Right apex is clear. IMPRESSION: Mild AC joint degenerative change. Electronically Signed   By: Esmeralda Hedge M.D.   On: 06/15/2023 15:07   DG Cervical Spine Complete Result Date: 06/15/2023 CLINICAL DATA:  Worsening chronic neck pain EXAM: CERVICAL SPINE - COMPLETE 6 VIEW COMPARISON:  MRI cervical spine dated 02/16/2021 FINDINGS: Postsurgical changes of C5-C7 anterior spinal fusion. Hardware appears intact. There is no evidence of cervical spine fracture or prevertebral soft tissue swelling. Alignment is normal. Apparent left greater than right neural foraminal narrowing spanning C5-7, which may be artifactual related to patient positioning. Unchanged soft tissue calcifications overlying the posterior neck at the level of C5. Broken safety pin is seen projecting over the mid chest at the level of T7 on oblique view. IMPRESSION: 1. Postsurgical changes of C5-C7 anterior spinal fusion. No acute fracture or traumatic listhesis. 2. Apparent left greater than right neural foraminal narrowing spanning C5-7, which may be artifactual related to patient positioning. 3. Broken safety pin is seen projecting over the mid chest at the level of T7 on oblique view, likely external to the patient. Electronically Signed   By: Limin  Xu M.D.   On: 06/15/2023 14:59    Procedures Procedures    Medications Ordered in ED Medications - No data to display  ED Course/ Medical Decision Making/ A&P                                 Medical Decision Making Patient complains of pain in her neck and her right shoulder for the past 5 years.  Patient has  had surgery for the same.  Patient reports pain has been getting worse over 5 years.  Amount and/or Complexity of Data Reviewed Radiology: ordered and independent interpretation performed. Decision-making details documented in ED Course.    Details: X-ray right shoulder shows AC joint degenerative changes. X-ray C-spine shows postsurgical changes C5 C7 anterior fusion no acute findings.  Risk Prescription drug management. Risk Details: Patient given Robaxin  and hydrocodone .  Patient reports pain is much improved.  I suspect patient's pain is secondary to radiculopathy from her neck although she also could also have some pain from  degenerative changes at her [facility] joint.  Patient is advised to follow-up with her physician for recheck.  Patient is given a prescription for hydrocodone  and Robaxin .           Final Clinical Impression(s) / ED Diagnoses Final diagnoses:  Arthritis of right acromioclavicular joint    Rx / DC Orders ED Discharge Orders          Ordered    HYDROcodone -acetaminophen  (NORCO/VICODIN) 5-325 MG tablet  Every 4 hours PRN        06/15/23 1521    methocarbamol  (ROBAXIN ) 500 MG tablet  2 times daily        06/15/23 1521           An After Visit Summary was printed and given to the patient.    Sandi Crosby, PA-C 06/15/23 1525    Carin Charleston, MD 06/15/23 [phone]

## 2023-06-15 NOTE — Telephone Encounter (Signed)
Waiting on patient's arrival to the ED

## 2023-06-15 NOTE — ED Triage Notes (Signed)
 Patient presents with neck and right shoulder pain that is chronic. She believes the pain is worsening. The pains started years ago. About 2 years ago the patient had spinal surgery.

## 2023-06-15 NOTE — Telephone Encounter (Signed)
  Chief Complaint: neck pain, back pain Symptoms: neck pain/shoulder pain, bilateral arm weakness, lower back pain radiating to buttocks Frequency: x 5 years Pertinent Negatives: Patient denies chest pain, SOB, numbness, overuse or injury, loss of bowel or bladder control. Disposition: [x] ED /[] Urgent Care (no appt availability in office) / [] Appointment(In office/virtual)/ []  [place] [facility]/ [] Home Care/ [] Refused Recommended Disposition /[] [place] [facility]/ []  Follow-up with PCP Additional Notes: Patient having difficulty with triage, she states she is walking on the side of the road to work and can not hear very well. Advised patient to go to ED due to arm weakness from neck pain. Patient verbalizes understanding and states she is agreeable to go to ED.  Copied from CRM [id number]. Topic: Clinical - Red Word Triage >> Jun 15, 2023  9:49 AM Chuck Crater wrote: Red Word that prompted transfer to Nurse Triage: Patient has extreme back and neck pain. Reason for Disposition  Weakness of an arm or hand  Answer Assessment - Initial Assessment Questions 1. ONSET: "When did the pain begin?"      She states it has been going on for a long time, 5 years.  2. LOCATION: "Where does it hurt?"      Neck and shoulders.  3. PATTERN "Does the pain come and go, or has it been constant since it started?"      Constant.  4. SEVERITY: "How bad is the pain?"  (Scale 1-10; or mild, moderate, severe)   - NO PAIN (0): no pain or only slight stiffness    - MILD (1-3): doesn't interfere with normal activities    - MODERATE (4-7): interferes with normal activities or awakens from sleep    - SEVERE (8-10):  excruciating pain, unable to do any normal activities      7/10. She states she usually takes motrin but has not taken any today.  5. RADIATION: "Does the pain go anywhere else, shoot into your arms?"     No. 6. CORD SYMPTOMS: "Any weakness or numbness of the arms or legs?"     Weakness in both  arms.  7. CAUSE: "What do you think is causing the neck pain?"     She states she isn't sure if its arthritis or her spinal stenosis.  8. NECK OVERUSE: "Any recent activities that involved turning or twisting the neck?"     No.  9. OTHER SYMPTOMS: "Do you have any other symptoms?" (e.g., headache, fever, chest pain, difficulty breathing, neck swelling)     Lower back pain radiates to buttocks.  10. PREGNANCY: "Is there any chance you are pregnant?" "When was your last menstrual period?"       N/A.  Protocols used: Neck Pain or Stiffness-A-AH

## 2023-06-16 ENCOUNTER — Telehealth: Payer: Self-pay

## 2023-06-16 ENCOUNTER — Telehealth: Payer: Self-pay | Admitting: *Deleted

## 2023-06-16 NOTE — Transitions of Care (Post Inpatient/ED Visit) (Signed)
   06/16/2023  Name: Marisa Sherman MRN: [id number] DOB: 1960/03/14  Today's TOC FU Call Status: Today's TOC FU Call Status:: Unsuccessful Call (1st Attempt) Unsuccessful Call (1st Attempt) Date: 06/16/23  Attempted to reach the patient regarding the most recent Inpatient/ED visit.  Follow Up Plan: Additional outreach attempts will be made to reach the patient to complete the Transitions of Care (Post Inpatient/ED visit) call.   [employer], Scientist, research (physical sciences), Charity fundraiser

## 2023-06-16 NOTE — Telephone Encounter (Signed)
 Copied from CRM [id number]. Topic: General - Other >> Jun 16, 2023  3:45 PM Jenice Mitts wrote: Reason for CRM: Patient is calling because she missed a call from [employer]  She stated call back anytime she will be on the lookout for the call

## 2023-06-22 ENCOUNTER — Ambulatory Visit: Payer: Self-pay

## 2023-06-22 ENCOUNTER — Inpatient Hospital Stay: Admitting: Internal Medicine

## 2023-06-22 NOTE — Telephone Encounter (Signed)
 Copied from CRM [id number]. Topic: Clinical - Medication Question >> Jun 22, 2023  3:18 PM Jim Motts C wrote: Reason for CRM: Patient called in and stated that the prescription that was she was prescribed at the hospital for pain is causing itchiness and she is wondering if she may be prescribed something else to assist with pain. Patient contact is [phone].

## 2023-06-22 NOTE — Telephone Encounter (Signed)
  Chief Complaint: medication reaction Symptoms: itchiness and difficulty sleeping  Frequency: since started taking from ED 06/15/23 Pertinent Negatives: NA Disposition: [] ED /[] Urgent Care (no appt availability in office) / [x] Appointment(In office/virtual)/ []  [place] [facility]/ [] Home Care/ [] Refused Recommended Disposition /[]  Mobile Bus/ [x]  Follow-up with PCP Additional Notes: pt requesting something different for pain d/t having reaction to Hydrocodone  from ED. Pt states she had same problem when taking previously and also allergic to Benadryl so unable to help with sx. Scheduled HFU 06/27/23 and advised I would send message to provider for review.   Reason for Disposition  Prescription request for new medicine (not a refill)  Answer Assessment - Initial Assessment Questions 1. NAME of MEDICINE: "What medicine(s) are you calling about?"     hydrocodone  2. QUESTION: "What is your question?" (e.g., double dose of medicine, side effect)     Having SE from medication 3. PRESCRIBER: "Who prescribed the medicine?" Reason: if prescribed by specialist, call should be referred to that group.     ED provider  4. SYMPTOMS: "Do you have any symptoms?" If Yes, ask: "What symptoms are you having?"  "How bad are the symptoms (e.g., mild, moderate, severe)     Itchiness and difficulty sleeping  Protocols used: Medication Question Call-A-AH

## 2023-06-28 ENCOUNTER — Encounter: Payer: Self-pay | Admitting: Internal Medicine

## 2023-06-28 ENCOUNTER — Ambulatory Visit (INDEPENDENT_AMBULATORY_CARE_PROVIDER_SITE_OTHER): Admitting: Internal Medicine

## 2023-06-28 VITALS — BP 110/80 | HR 97 | Temp 97.8°F | Ht 66.0 in | Wt 190.0 lb

## 2023-06-28 DIAGNOSIS — M5412 Radiculopathy, cervical region: Secondary | ICD-10-CM | POA: Diagnosis not present

## 2023-06-28 MED ORDER — MELOXICAM 7.5 MG PO TABS
7.5000 mg | ORAL_TABLET | Freq: Every day | ORAL | 0 refills | Status: AC
Start: 2023-06-28 — End: ?

## 2023-06-28 MED ORDER — CYCLOBENZAPRINE HCL 5 MG PO TABS
5.0000 mg | ORAL_TABLET | Freq: Every evening | ORAL | 1 refills | Status: DC | PRN
Start: 2023-06-28 — End: 2023-08-18

## 2023-06-28 NOTE — Progress Notes (Signed)
 Established Patient Office Visit     CC/Reason for Visit: ED follow-up, neck and right arm pain  HPI: Marisa Sherman is a 63 y.o. female who is coming in today for the above mentioned reasons.  She had a cervical fusion by Dr. Gwendlyn Lemmings in 2023.  Over the last month to 6 weeks has had worsening neck pain with tingling pain down her right arm to about the elbow area.  She went to the emergency department, had x-rays that showed intact hardware and no acute changes.  She continues to have significant pain.  She was given hydrocodone  which she was unable to take due to itch.  She took Robaxin  with only minimal relief.   Past Medical/Surgical History: Past Medical History:  Diagnosis Date   Abnormal Pap smear of cervix    Allergy    Anxiety    Bipolar disorder (HCC)    Cataract    Complication of anesthesia    Degenerative disc disease at L5-S1 level    Depression    GERD (gastroesophageal reflux disease)    Gout    Hyperlipidemia    Hypertension    Overactive bladder    PONV (postoperative nausea and vomiting)     Past Surgical History:  Procedure Laterality Date   ANTERIOR CERVICAL DECOMP/DISCECTOMY FUSION N/A 03/05/2021   Procedure: ACDF - C5-C6 - C6-C7;  Surgeon: Agustina Aldrich, MD;  Location: [facility] OR;  Service: Neurosurgery;  Laterality: N/A;  3C   CARPAL TUNNEL RELEASE Bilateral    CERVIX LESION DESTRUCTION  1985   CHOLECYSTECTOMY     DILATION AND CURETTAGE OF UTERUS     EYE SURGERY Bilateral    cataract removal   TONSILLECTOMY     TUBAL LIGATION      Social History:  reports that she has been smoking cigarettes. She has a 48 pack-year smoking history. She has been exposed to tobacco smoke. She has never used smokeless tobacco. She reports that she does not currently use alcohol. She reports that she does not currently use drugs.  Allergies: Allergies  Allergen Reactions   Codeine Nausea Only   Diphenhydramine Hcl Other (See Comments)    Pt unsure why   Lithium   Itching   Sulfa Antibiotics Nausea And Vomiting   Amoxicillin  Nausea And Vomiting    Family History:  Family History  Problem Relation Age of Onset   Depression Mother    Anxiety disorder Mother    Asthma Mother    Bipolar disorder Father    Depression Sister    Anxiety disorder Sister    Depression Brother    Anxiety disorder Brother    Depression Brother    Anxiety disorder Brother    Cancer Neg Hx    Diabetes Neg Hx    Heart disease Neg Hx    Colon cancer Neg Hx    Breast cancer Neg Hx    Ovarian cancer Neg Hx    Cervical cancer Neg Hx    Colon polyps Neg Hx      Current Outpatient Medications:    acetaminophen  (TYLENOL ) 500 MG tablet, Take 1,000 mg by mouth every 6 (six) hours as needed for mild pain., Disp: , Rfl:    allopurinol  (ZYLOPRIM ) 100 MG tablet, TAKE 1 TABLET BY MOUTH DAILY, Disp: 100 tablet, Rfl: 0   aspirin  EC 81 MG tablet, Take 81 mg by mouth every evening., Disp: , Rfl:    atorvastatin  (LIPITOR) 40 MG tablet, TAKE 1 TABLET BY MOUTH DAILY  AT  6 PM, Disp: 100 tablet, Rfl: 0   buPROPion  (WELLBUTRIN  XL) 150 MG 24 hr tablet, TAKE 1 TABLET BY MOUTH DAILY, Disp: 90 tablet, Rfl: 3   cyclobenzaprine  (FLEXERIL ) 5 MG tablet, Take 1 tablet (5 mg total) by mouth at bedtime as needed for muscle spasms., Disp: 30 tablet, Rfl: 1   FLUoxetine  (PROZAC ) 20 MG capsule, Take 3 capsules (60 mg total) by mouth daily., Disp: 90 capsule, Rfl: 0   hydrOXYzine  (VISTARIL ) 50 MG capsule, TAKE 1 CAPSULE BY MOUTH EVERY 8  HOURS AS NEEDED FOR ANXIETY, Disp: 180 capsule, Rfl: 1   lisinopril  (ZESTRIL ) 40 MG tablet, TAKE 1 TABLET BY MOUTH DAILY, Disp: 100 tablet, Rfl: 0   meloxicam  (MOBIC ) 7.5 MG tablet, Take 1 tablet (7.5 mg total) by mouth daily., Disp: 30 tablet, Rfl: 0   pantoprazole  (PROTONIX ) 40 MG tablet, TAKE 1 TABLET BY MOUTH TWICE  DAILY, Disp: 200 tablet, Rfl: 0   QUEtiapine  Fumarate (SEROQUEL  XR) 150 MG 24 hr tablet, Take 1 tablet (150 mg total) by mouth at bedtime., Disp: 30  tablet, Rfl: 11   traZODone  (DESYREL ) 150 MG tablet, Take 1 tablet (150 mg total) by mouth at bedtime as needed for sleep., Disp: 30 tablet, Rfl: 0   cyclobenzaprine  (FLEXERIL ) 5 MG tablet, Take 1 tablet (5 mg total) by mouth 3 (three) times daily as needed for muscle spasms. (Patient not taking: Reported on 06/28/2023), Disp: 30 tablet, Rfl: 0   methocarbamol  (ROBAXIN ) 500 MG tablet, Take 1 tablet (500 mg total) by mouth 2 (two) times daily. (Patient not taking: Reported on 06/28/2023), Disp: 20 tablet, Rfl: 0  Review of Systems:  Negative unless indicated in HPI.   Physical Exam: Vitals:   06/28/23 0750  BP: 110/80  Pulse: 97  Temp: 97.8 F (36.6 C)  TempSrc: Oral  SpO2: 99%  Weight: 190 lb (86.2 kg)  Height: 5\' 6"  (1.676 m)    Body mass index is 30.67 kg/m.    Impression and Plan:  Cervical radiculopathy -     Meloxicam ; Take 1 tablet (7.5 mg total) by mouth daily.  Dispense: 30 tablet; Refill: 0 -     Cyclobenzaprine  HCl; Take 1 tablet (5 mg total) by mouth at bedtime as needed for muscle spasms.  Dispense: 30 tablet; Refill: 1 -     Ambulatory referral to Neurosurgery   - Will send back to see Dr. Gwendlyn Lemmings, will likely need a repeat MRI of her cervical spine.  Will send in a prescription for meloxicam  and Flexeril .  Time spent:30 minutes reviewing chart, interviewing and examining patient and formulating plan of care.     Marguerita Shih, MD [facility]

## 2023-06-30 ENCOUNTER — Telehealth: Payer: Self-pay

## 2023-06-30 NOTE — Telephone Encounter (Signed)
 Copied from CRM [id number]. Topic: Clinical - Medication Question >> Jun 30, 2023  8:36 AM [name] wrote: Reason for CRM: Patient called in stated she would like for those prescription that she was just prescribed to be upped the dosage stated she doesn't feel like they are doing anything  meloxicam  (MOBIC ) 7.5 MG tablet cyclobenzaprine  (FLEXERIL ) 5 MG tablet >> Jun 30, 2023  2:54 PM Baldo Levan wrote: Patient is calling in requesting the medication to be increased today. Patient is also requesting a call back regarding this from one of the nurses.

## 2023-06-30 NOTE — Telephone Encounter (Signed)
 Copied from CRM [id number]. Topic: Clinical - Medication Question >> Jun 30, 2023  8:36 AM [name] wrote: Reason for CRM: Patient called in stated she would like for those prescription that she was just prescribed to be upped the dosage stated she doesn't feel like they are doing anything  meloxicam  (MOBIC ) 7.5 MG tablet cyclobenzaprine  (FLEXERIL ) 5 MG tablet

## 2023-07-03 ENCOUNTER — Telehealth: Payer: Self-pay

## 2023-07-03 NOTE — Telephone Encounter (Signed)
 Left message on machine for patient explaining that Dr Ival Marines is out of the office until 07/17/23.

## 2023-07-03 NOTE — Telephone Encounter (Signed)
 Copied from CRM [id number]. Topic: Clinical - Medication Question >> Jun 30, 2023  4:16 PM Dewanda Foots wrote: Reason for CRM: Pt called stating that she would like the following medications need to be increased in the dosage please. States the medication is not working on current dosage.  meloxicam  (MOBIC ) 7.5 MG tablet cyclobenzaprine  (FLEXERIL ) 5 MG tablet  She would like this done before the weekend if at all possible.  [id number] Jonette Nestle, [place] - [address] AT [facility] OF [facility] [address] & [facility] [address] [place] [address] Phone: [phone] Fax: [phone] Hours: Not open 24 hours  Please call patient when this is completed or with any updates-[phone].  Thank you so much! >> Jul 03, 2023  9:40 AM Rosamond Comes wrote: Patient calling has questions about medications  and would like a nurse to call her back. Patient is very frustrated

## 2023-07-06 ENCOUNTER — Other Ambulatory Visit: Payer: Self-pay | Admitting: Internal Medicine

## 2023-07-06 DIAGNOSIS — M5412 Radiculopathy, cervical region: Secondary | ICD-10-CM

## 2023-07-06 NOTE — Telephone Encounter (Signed)
 Office visit 06/28/23:  Will send back to see Dr. Gwendlyn Lemmings, will likely need a repeat MRI of her cervical spine. Will send in a prescription for meloxicam  and Flexeril .   Okay to refill?

## 2023-07-07 NOTE — Telephone Encounter (Signed)
 Rx done.

## 2023-07-11 ENCOUNTER — Telehealth: Payer: Self-pay

## 2023-07-11 DIAGNOSIS — M542 Cervicalgia: Secondary | ICD-10-CM

## 2023-07-11 DIAGNOSIS — M545 Low back pain, unspecified: Secondary | ICD-10-CM

## 2023-07-11 DIAGNOSIS — M5412 Radiculopathy, cervical region: Secondary | ICD-10-CM

## 2023-07-11 MED ORDER — CYCLOBENZAPRINE HCL 5 MG PO TABS: 5.0000 mg | ORAL_TABLET | Freq: Three times a day (TID) | ORAL | 0 refills | Status: DC | PRN

## 2023-07-11 NOTE — Telephone Encounter (Signed)
 Refill sent.

## 2023-07-11 NOTE — Addendum Note (Signed)
 Addended by: Nicolina Barrios B on: 07/11/2023 08:48 AM   Modules accepted: Orders

## 2023-07-11 NOTE — Telephone Encounter (Signed)
 Copied from CRM [id number]. Topic: Clinical - Prescription Issue >> Jul 11, 2023  8:22 AM Bambi Bonine D wrote: Reason for CRM: Pt stated that she contacted the pharmacy to pick up the medication for the cyclobenzaprine  (FLEXERIL ) 5 MG tablet and was informed that they didn't have the medication in the system for the pt. Pt is wanting the medication resent to the pharmacy today if possible.

## 2023-07-16 ENCOUNTER — Encounter (HOSPITAL_COMMUNITY): Payer: Self-pay

## 2023-07-16 ENCOUNTER — Emergency Department (HOSPITAL_COMMUNITY)
Admission: EM | Admit: 2023-07-16 | Discharge: 2023-07-16 | Disposition: A | Discharge status: Home / Self Care | Attending: Emergency Medicine | Admitting: Emergency Medicine

## 2023-07-16 ENCOUNTER — Emergency Department (HOSPITAL_COMMUNITY)

## 2023-07-16 ENCOUNTER — Other Ambulatory Visit: Payer: Self-pay

## 2023-07-16 DIAGNOSIS — S62639A Displaced fracture of distal phalanx of unspecified finger, initial encounter for closed fracture: Secondary | ICD-10-CM

## 2023-07-16 DIAGNOSIS — Z7982 Long term (current) use of aspirin: Secondary | ICD-10-CM | POA: Diagnosis not present

## 2023-07-16 DIAGNOSIS — S62632A Displaced fracture of distal phalanx of right middle finger, initial encounter for closed fracture: Secondary | ICD-10-CM | POA: Insufficient documentation

## 2023-07-16 DIAGNOSIS — S62669A Nondisplaced fracture of distal phalanx of unspecified finger, initial encounter for closed fracture: Secondary | ICD-10-CM | POA: Diagnosis not present

## 2023-07-16 DIAGNOSIS — W270XXA Contact with workbench tool, initial encounter: Secondary | ICD-10-CM | POA: Diagnosis not present

## 2023-07-16 DIAGNOSIS — M79644 Pain in right finger(s): Secondary | ICD-10-CM | POA: Diagnosis not present

## 2023-07-16 DIAGNOSIS — S6991XA Unspecified injury of right wrist, hand and finger(s), initial encounter: Secondary | ICD-10-CM | POA: Diagnosis present

## 2023-07-16 MED ORDER — OXYCODONE HCL 5 MG PO TABS
5.0000 mg | ORAL_TABLET | Freq: Once | ORAL | Status: AC
  Administered 2023-07-16: 5 mg via ORAL
  Filled 2023-07-16: qty 1

## 2023-07-16 MED ORDER — HYDROCODONE-ACETAMINOPHEN 5-325 MG PO TABS: 1.0000 | ORAL_TABLET | ORAL | 0 refills | Status: AC | PRN

## 2023-07-16 MED ORDER — HYDROCODONE-ACETAMINOPHEN 5-325 MG PO TABS: 1.0000 | ORAL_TABLET | Freq: Once | ORAL | Status: DC

## 2023-07-16 MED ORDER — KETOROLAC TROMETHAMINE 15 MG/ML IJ SOLN: 30.0000 mg | Freq: Once | INTRAMUSCULAR | Status: DC

## 2023-07-16 MED ORDER — IBUPROFEN 800 MG PO TABS
800.0000 mg | ORAL_TABLET | Freq: Once | ORAL | Status: AC
  Administered 2023-07-16: 800 mg via ORAL
  Filled 2023-07-16: qty 1

## 2023-07-16 NOTE — ED Triage Notes (Signed)
 Pt hit her right middle finger with a hammer yesterday while trying hang a picture. Pt c/o right middle finger pain.

## 2023-07-16 NOTE — ED Provider Notes (Addendum)
 [place] EMERGENCY DEPARTMENT AT [facility] Provider Note   CSN: [id number] Arrival date & time: 07/16/23  9282     Patient presents with: Finger Injury  HPI Marisa Sherman is a 63 y.o. female presenting for right middle finger injury.  Occurred yesterday.  She was using a hammer to hang a picture and excellently struck the tip of her finger with a hammer.  She reports bruising, swelling and pain at the tip of her right middle finger.  Denies sensation loss.  Still able to move the finger normally.   HPI     Prior to Admission medications   Medication Sig Start Date End Date Taking? Authorizing Provider  HYDROcodone -acetaminophen  (NORCO/VICODIN) 5-325 MG tablet Take 1 tablet by mouth every 4 (four) hours as needed for up to 8 doses. 07/16/23  Yes Clavin Ruhlman K, PA-C  acetaminophen  (TYLENOL ) 500 MG tablet Take 1,000 mg by mouth every 6 (six) hours as needed for mild pain.    [provider]  allopurinol  (ZYLOPRIM ) 100 MG tablet TAKE 1 TABLET BY MOUTH DAILY 05/22/23   Theophilus Andrews, Tully GRADE, MD  aspirin  EC 81 MG tablet Take 81 mg by mouth every evening.    [provider]  atorvastatin  (LIPITOR) 40 MG tablet TAKE 1 TABLET BY MOUTH DAILY AT  6 PM 05/22/23   Theophilus Andrews, Tully GRADE, MD  buPROPion  (WELLBUTRIN  XL) 150 MG 24 hr tablet TAKE 1 TABLET BY MOUTH DAILY 04/06/23   Theophilus Andrews, Tully GRADE, MD  cyclobenzaprine  (FLEXERIL ) 5 MG tablet Take 1 tablet (5 mg total) by mouth at bedtime as needed for muscle spasms. 06/28/23   Theophilus Andrews, Tully GRADE, MD  cyclobenzaprine  (FLEXERIL ) 5 MG tablet Take 1 tablet (5 mg total) by mouth 3 (three) times daily as needed for muscle spasms. 07/11/23   Theophilus Andrews, Tully GRADE, MD  FLUoxetine  (PROZAC ) 20 MG capsule Take 3 capsules (60 mg total) by mouth daily. 06/02/23 06/01/24  McQuilla, Jai B, MD  hydrOXYzine  (VISTARIL ) 50 MG capsule TAKE 1 CAPSULE BY MOUTH EVERY 8  HOURS AS NEEDED FOR ANXIETY 02/24/23   McQuilla, Jai B,  MD  lisinopril  (ZESTRIL ) 40 MG tablet TAKE 1 TABLET BY MOUTH DAILY 06/16/23   Theophilus Andrews, Tully GRADE, MD  meloxicam  (MOBIC ) 7.5 MG tablet TAKE 1 TABLET(7.5 MG) BY MOUTH DAILY 07/07/23   Nafziger, Darleene, NP  methocarbamol  (ROBAXIN ) 500 MG tablet Take 1 tablet (500 mg total) by mouth 2 (two) times daily. Patient not taking: Reported on 06/28/2023 06/15/23   Sofia, Leslie K, PA-C  pantoprazole  (PROTONIX ) 40 MG tablet TAKE 1 TABLET BY MOUTH TWICE  DAILY 05/22/23   Theophilus Andrews, Tully GRADE, MD  QUEtiapine  Fumarate (SEROQUEL  XR) 150 MG 24 hr tablet Take 1 tablet (150 mg total) by mouth at bedtime. 10/07/22   McQuilla, Jai B, MD  traZODone  (DESYREL ) 150 MG tablet Take 1 tablet (150 mg total) by mouth at bedtime as needed for sleep. 06/02/23   McQuilla, Jai B, MD    Allergies: Codeine, Diphenhydramine hcl, Lithium , Sulfa antibiotics, and Amoxicillin     Review of Systems See HPI  Updated Vital Signs BP (!) 170/107 (BP Location: Right Arm)   Pulse 73   Temp 98.2 F (36.8 C) (Oral)   Resp 18   SpO2 98%   Physical Exam Constitutional:      Appearance: Normal appearance.  HENT:     Head: Normocephalic.     Nose: Nose normal.   Eyes:  Conjunctiva/sclera: Conjunctivae normal.   Pulmonary:     Effort: Pulmonary effort is normal.   Musculoskeletal:     Comments: Edema, erythema and tenderness to touch at the tip of the right middle finger.  Cap refill is brisk.  Sensation intact to light touch.  No obvious deformity.   Neurological:     Mental Status: She is alert.   Psychiatric:        Mood and Affect: Mood normal.     (all labs ordered are listed, but only abnormal results are displayed) Labs Reviewed - No data to display  EKG: None  Radiology: DG Finger Middle Right Result Date: 07/16/2023 EXAM: 2 VIEW(S) XRAY OF THE RIGHT FINGER 07/16/2023 07:34:00 AM COMPARISON: None available. CLINICAL HISTORY: Pain. Per chart: Pt hit her right middle finger with a hammer yesterday while  trying hang a picture. Pt c/o right middle finger pain. FINDINGS: BONES AND JOINTS: A nondisplaced fracture is present in the tuft of the distal phalanx. No other acute fractures are present. SOFT TISSUES: Soft tissue swelling is present. IMPRESSION: 1. Nondisplaced fracture in the tuft of the distal phalanx of the right middle finger. 2. Soft tissue swelling. Electronically signed by: Lonni Necessary MD 07/16/2023 07:44 AM EDT RP Workstation: [id number]     Procedures   Medications Ordered in the ED  ibuprofen (ADVIL) tablet 800 mg (has no administration in time range)  oxyCODONE (Oxy IR/ROXICODONE) immediate release tablet 5 mg (has no administration in time range)                                    Medical Decision Making Amount and/or Complexity of Data Reviewed Radiology: ordered.  Risk Prescription drug management.   63 year old well-appearing female presenting for finger injury.  Exam of the right middle finger revealed erythema, edema and tenderness.  X-ray revealed a nondisplaced fracture in the tuft of the distal phalanx of the right middle finger with associated swelling.  Workup suggest closed tuft fracture of the right middle finger.  Does not appear to be associated nailbed laceration, no obvious subungual hematoma.  Applied finger splint.  Gave her ibuprofen.  Discussed conservative treatment home but also sent a few tablets of Norco to her pharmacy.  Advised to follow-up with hand if her symptoms worsened otherwise advised her to follow-up with her PCP.  Discussed return precautions.  Discharged.     Final diagnoses:  Closed fracture of tuft of distal phalanx of finger    ED Discharge Orders          Ordered    HYDROcodone -acetaminophen  (NORCO/VICODIN) 5-325 MG tablet  Every 4 hours PRN        07/16/23 0823               Gaylyn Berish K, PA-C 07/16/23 0819    Lang Norleen POUR, PA-C 07/16/23 9176    Dreama Longs, MD 07/16/23 [phone]

## 2023-07-16 NOTE — Discharge Instructions (Signed)
 Evaluation today revealed a you have a fracture in the tip of your right middle finger.  Would recommend applying ice 3-4 times a day.  You can also take ibuprofen and Tylenol .  Please follow-up with your PCP.  If your symptoms worsen would recommend you follow-up with the Hand doctor.

## 2023-07-17 NOTE — Telephone Encounter (Signed)
 Left message on machine for patient to return our call

## 2023-07-18 NOTE — Telephone Encounter (Signed)
 Left message on machine for patient to return our call

## 2023-07-19 ENCOUNTER — Telehealth: Payer: Self-pay

## 2023-07-19 NOTE — Telephone Encounter (Signed)
3rd attempt to reach the patient Left message on machine for patient to return our call.

## 2023-07-19 NOTE — Telephone Encounter (Signed)
 Attempted to call pt for recent ed visit.

## 2023-07-21 ENCOUNTER — Telehealth (HOSPITAL_COMMUNITY): Payer: Self-pay

## 2023-07-21 NOTE — Telephone Encounter (Signed)
 Hello,   Pt requesting for a refill of Hydroxyzine  notified pharmacy pt has 1 refill left it seems on file from Dr. Juleen.   JNL

## 2023-07-21 NOTE — Telephone Encounter (Signed)
 Per Dr. Juleen on 5/9, Pt will need to make appt before any future refills

## 2023-07-28 ENCOUNTER — Other Ambulatory Visit: Payer: Self-pay | Admitting: Internal Medicine

## 2023-07-28 DIAGNOSIS — K219 Gastro-esophageal reflux disease without esophagitis: Secondary | ICD-10-CM

## 2023-08-02 ENCOUNTER — Ambulatory Visit: Admitting: Internal Medicine

## 2023-08-08 ENCOUNTER — Ambulatory Visit: Admitting: Internal Medicine

## 2023-08-08 DIAGNOSIS — E782 Mixed hyperlipidemia: Secondary | ICD-10-CM

## 2023-08-08 DIAGNOSIS — R7303 Prediabetes: Secondary | ICD-10-CM

## 2023-08-08 DIAGNOSIS — I10 Essential (primary) hypertension: Secondary | ICD-10-CM

## 2023-08-17 ENCOUNTER — Telehealth (HOSPITAL_COMMUNITY): Payer: Self-pay | Admitting: *Deleted

## 2023-08-17 ENCOUNTER — Telehealth (HOSPITAL_COMMUNITY): Payer: Self-pay | Admitting: Student in an Organized Health Care Education/Training Program

## 2023-08-17 NOTE — Telephone Encounter (Signed)
 Front desk staff person tried calling patient twice this am to schedule her an appt with this clinic as she has not been seen in several months and her pharmacy is sending a request for her rx. No answer with both calls, left a voice mail for her.

## 2023-08-17 NOTE — Telephone Encounter (Signed)
 Request from [facility] pharmacy for patietns quetiapine  XR. Last seen by provider in March 2025. Last note re rx request stated she has to make an appt before getting futher refills. She has not been seen in 4 months and her previous provider is no longer her at this clinic so she will need to be assigned a new provider. Responded to pharmacy fax stating wont be refilled at this time. Asked front desk staff to notify her she needs to reschedule .

## 2023-08-17 NOTE — Telephone Encounter (Signed)
 Errror, opened in error.

## 2023-08-18 ENCOUNTER — Encounter (HOSPITAL_COMMUNITY): Payer: Self-pay | Admitting: Emergency Medicine

## 2023-08-18 ENCOUNTER — Other Ambulatory Visit: Payer: Self-pay | Admitting: Internal Medicine

## 2023-08-18 ENCOUNTER — Other Ambulatory Visit: Payer: Self-pay

## 2023-08-18 ENCOUNTER — Emergency Department (HOSPITAL_COMMUNITY)
Admission: EM | Admit: 2023-08-18 | Discharge: 2023-08-19 | Disposition: A | Discharge status: Home / Self Care | Attending: Emergency Medicine | Admitting: Emergency Medicine

## 2023-08-18 DIAGNOSIS — S0502XA Injury of conjunctiva and corneal abrasion without foreign body, left eye, initial encounter: Secondary | ICD-10-CM | POA: Insufficient documentation

## 2023-08-18 DIAGNOSIS — X58XXXA Exposure to other specified factors, initial encounter: Secondary | ICD-10-CM | POA: Insufficient documentation

## 2023-08-18 DIAGNOSIS — M5412 Radiculopathy, cervical region: Secondary | ICD-10-CM

## 2023-08-18 DIAGNOSIS — Z79899 Other long term (current) drug therapy: Secondary | ICD-10-CM | POA: Insufficient documentation

## 2023-08-18 DIAGNOSIS — S0592XA Unspecified injury of left eye and orbit, initial encounter: Secondary | ICD-10-CM | POA: Diagnosis present

## 2023-08-18 DIAGNOSIS — Z7982 Long term (current) use of aspirin: Secondary | ICD-10-CM | POA: Insufficient documentation

## 2023-08-18 DIAGNOSIS — I10 Essential (primary) hypertension: Secondary | ICD-10-CM | POA: Diagnosis not present

## 2023-08-18 NOTE — ED Triage Notes (Signed)
  Patient comes in with L eye pain and drainage that started around 1400 today.  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  L eye started swelling and draining.  States she used someone on the buses eye drops but did not make it better.  States it feels like something is stuck on eye.  Pain 5/10, burning.

## 2023-08-18 NOTE — Telephone Encounter (Signed)
 Copied from CRM [id number]. Topic: Clinical - Medication Refill >> Aug 18, 2023 10:06 AM Carlyon D wrote: Medication:  cyclobenzaprine  (FLEXERIL ) 5 MG tablet    Has the patient contacted their pharmacy? Yes (Agent: If no, request that the patient contact the pharmacy for the refill. If patient does not wish to contact the pharmacy document the reason why and proceed with request.) (Agent: If yes, when and what did the pharmacy advise?)  This is the patient's preferred pharmacy:  [facility] [place], [place] - [address] [place] [address] Phone: [phone] Fax: [phone]    Is this the correct pharmacy for this prescription? Yes If no, delete pharmacy and type the correct one.   Has the prescription been filled recently? Yes  Is the patient out of the medication? Yes  Has the patient been seen for an appointment in the last year OR does the patient have an upcoming appointment? Yes  Can we respond through MyChart? Yes  Agent: Please be advised that Rx refills may take up to 3 business days. We ask that you follow-up with your pharmacy.

## 2023-08-19 DIAGNOSIS — S0502XA Injury of conjunctiva and corneal abrasion without foreign body, left eye, initial encounter: Secondary | ICD-10-CM | POA: Diagnosis not present

## 2023-08-19 MED ORDER — FLUORESCEIN SODIUM 1 MG OP STRP
1.0000 | ORAL_STRIP | Freq: Once | OPHTHALMIC | Status: AC
  Administered 2023-08-19: 1 via OPHTHALMIC
  Filled 2023-08-19: qty 1

## 2023-08-19 MED ORDER — ERYTHROMYCIN 5 MG/GM OP OINT
TOPICAL_OINTMENT | Freq: Once | OPHTHALMIC | Status: AC
  Administered 2023-08-19: 1 via OPHTHALMIC
  Filled 2023-08-19: qty 3.5

## 2023-08-19 MED ORDER — TETRACAINE HCL 0.5 % OP SOLN
2.0000 [drp] | Freq: Once | OPHTHALMIC | Status: AC
  Administered 2023-08-19: 2 [drp] via OPHTHALMIC
  Filled 2023-08-19: qty 4

## 2023-08-19 MED ORDER — OFLOXACIN 0.3 % OP SOLN: 2.0000 [drp] | Freq: Four times a day (QID) | OPHTHALMIC | 0 refills | Status: AC

## 2023-08-19 NOTE — Discharge Instructions (Signed)
 You were seen in the ER today for your eye discomfort.  You have an abrasion to your cornea for which you been prescribed antibiotic eyedrops.  Please use them as prescribed the entire course.  You may follow-up with your primary care doctor or with the ophthalmologist listed below.  Return to the ER with any severe symptoms.

## 2023-08-19 NOTE — ED Provider Notes (Signed)
 [place] EMERGENCY DEPARTMENT AT [facility] Provider Note   CSN: [id number] Arrival date & time: 08/18/23  2133     Patient presents with: Eye Pain and Facial Swelling   Marisa Sherman is a 63 y.o. female who works part-time at [employer].  She presents today with concern for sudden onset foreign body sensation of the left eye, or this afternoon.  No blurry or double vision, no true pain in the eye just irritation, watering and itching.  Patient has been rubbing her eye extensively throughout the day, having difficulty with amount of watering of the eye and keeping the eye open. Patient is history of bipolar 1, hypertension, GERD, degenerative disc disease.   HPI     Prior to Admission medications   Medication Sig Start Date End Date Taking? Authorizing Provider  acetaminophen  (TYLENOL ) 500 MG tablet Take 1,000 mg by mouth every 6 (six) hours as needed for mild pain.    [provider]  allopurinol  (ZYLOPRIM ) 100 MG tablet TAKE 1 TABLET BY MOUTH DAILY 08/01/23   Theophilus Andrews, Tully GRADE, MD  aspirin  EC 81 MG tablet Take 81 mg by mouth every evening.    [provider]  atorvastatin  (LIPITOR) 40 MG tablet TAKE 1 TABLET BY MOUTH DAILY AT  6 PM 08/01/23   Theophilus Andrews, Tully GRADE, MD  buPROPion  (WELLBUTRIN  XL) 150 MG 24 hr tablet TAKE 1 TABLET BY MOUTH DAILY 04/06/23   Theophilus Andrews, Tully GRADE, MD  cyclobenzaprine  (FLEXERIL ) 5 MG tablet Take 1 tablet (5 mg total) by mouth at bedtime as needed for muscle spasms. 06/28/23   Theophilus Andrews, Tully GRADE, MD  cyclobenzaprine  (FLEXERIL ) 5 MG tablet Take 1 tablet (5 mg total) by mouth 3 (three) times daily as needed for muscle spasms. 07/11/23   Theophilus Andrews, Tully GRADE, MD  FLUoxetine  (PROZAC ) 20 MG capsule Take 3 capsules (60 mg total) by mouth daily. 06/02/23 06/01/24  McQuilla, Jai B, MD  HYDROcodone -acetaminophen  (NORCO/VICODIN) 5-325 MG tablet Take 1 tablet by mouth every 4 (four) hours as needed for up to 8 doses.  07/16/23   Robinson, John K, PA-C  hydrOXYzine  (VISTARIL ) 50 MG capsule TAKE 1 CAPSULE BY MOUTH EVERY 8  HOURS AS NEEDED FOR ANXIETY 02/24/23   McQuilla, Jai B, MD  lisinopril  (ZESTRIL ) 40 MG tablet TAKE 1 TABLET BY MOUTH DAILY 06/16/23   Theophilus Andrews, Tully GRADE, MD  meloxicam  (MOBIC ) 7.5 MG tablet TAKE 1 TABLET(7.5 MG) BY MOUTH DAILY 07/07/23   Nafziger, Darleene, NP  methocarbamol  (ROBAXIN ) 500 MG tablet Take 1 tablet (500 mg total) by mouth 2 (two) times daily. Patient not taking: Reported on 06/28/2023 06/15/23   Sofia, Leslie K, PA-C  pantoprazole  (PROTONIX ) 40 MG tablet TAKE 1 TABLET BY MOUTH TWICE  DAILY 08/01/23   Theophilus Andrews, Tully GRADE, MD  QUEtiapine  Fumarate (SEROQUEL  XR) 150 MG 24 hr tablet Take 1 tablet (150 mg total) by mouth at bedtime. 10/07/22   McQuilla, Jai B, MD  traZODone  (DESYREL ) 150 MG tablet Take 1 tablet (150 mg total) by mouth at bedtime as needed for sleep. 06/02/23   McQuilla, Jai B, MD    Allergies: Codeine, Diphenhydramine hcl, Lithium , Sulfa antibiotics, and Amoxicillin     Review of Systems  Eyes:  Positive for pain, discharge, redness and itching. Negative for photophobia and visual disturbance.    Updated Vital Signs BP (!) 179/105 (BP Location: Right Arm)   Pulse 76   Temp 98.1 F (36.7 C) (Oral)   Resp 18  Ht 5' 6 (1.676 m)   Wt 88.5 kg   SpO2 97%   BMI 31.47 kg/m   Physical Exam Vitals and nursing note reviewed.  HENT:     Head: Normocephalic and atraumatic.  Eyes:     General: Lids are normal. Vision grossly intact. No scleral icterus.       Right eye: No discharge.        Left eye: Discharge present.    Extraocular Movements: Extraocular movements intact.     Left eye: Normal extraocular motion.     Conjunctiva/sclera:     Left eye: Left conjunctiva is injected. Chemosis present.     Pupils: Pupils are equal, round, and reactive to light.     Left eye: Corneal abrasion and fluorescein  uptake present. Seidel exam negative.    Visual Fields:  Right eye visual fields normal and left eye visual fields normal.  Pulmonary:     Effort: Pulmonary effort is normal.  Skin:    General: Skin is warm and dry.  Neurological:     General: No focal deficit present.     Mental Status: She is alert.  Psychiatric:        Mood and Affect: Mood normal.     (all labs ordered are listed, but only abnormal results are displayed) Labs Reviewed - No data to display  EKG: None  Radiology: No results found.   Procedures   Medications Ordered in the ED  fluorescein  ophthalmic strip 1 strip (has no administration in time range)  tetracaine  (PONTOCAINE) 0.5 % ophthalmic solution 2 drop (has no administration in time range)                                    Medical Decision Making 63 year old female presents with concern for foreign body sensation to the left eye.  Hypertensive on intake.  Vitals otherwise reassuring.  Ocular exam as above, PERRL, EOMI.  Left eye scleral injection and conjunctival edema, fluorescein  uptake with corneal abrasion at the 5 o'clock position, no Sidel sign.  Vision grossly intact.  DDx includes limited to corneal abrasion, corneal ulcer, retained foreign body, blepharitis, acute viral conjunctivitis, bacterial conjunctivitis, glaucoma.  Risk Prescription drug management.   Clinical picture most consistent with corneal abrasion which was treated with antibiotic ointment in the ED and antibiotic eyedrops are prescribed in the emergency department.  Clinical concern for emergent underlying etiology that would warrant further ED workup or patient management is exceedingly low.  Marisa Sherman  voiced understanding of her medical evaluation and treatment plan. Each of their questions answered to their expressed satisfaction.  Return precautions were given.  Patient is well-appearing, stable, and was discharged in good condition. This chart was dictated using voice recognition software, Dragon. Despite the best efforts of  this provider to proofread and correct errors, errors may still occur which can change documentation meaning.      Final diagnoses:  None    ED Discharge Orders     None          Bobette Pleasant JONELLE DEVONNA 08/19/23 0344    Raford Lenis, MD 08/19/23 [phone]

## 2023-08-21 ENCOUNTER — Telehealth: Payer: Self-pay | Admitting: *Deleted

## 2023-08-21 NOTE — Telephone Encounter (Signed)
 Copied from CRM [id number]. Topic: Medical Record Request - Records Request >> Aug 21, 2023  8:54 AM Wyona SQUIBB wrote: Reason for CRM: Pt was admitted to the ER for a scratched Cornia for her eye, pt needs a note to excuse her until Wednesday.   Pt had a lot of pain on the eye due to the scratch.    Pt callback [phone]  Spoke to the patient and explained that Dr Theophilus is out of the office.  Office visit scheduled for 08/22/23.

## 2023-08-22 ENCOUNTER — Ambulatory Visit (INDEPENDENT_AMBULATORY_CARE_PROVIDER_SITE_OTHER): Admitting: Internal Medicine

## 2023-08-22 ENCOUNTER — Encounter: Payer: Self-pay | Admitting: Internal Medicine

## 2023-08-22 VITALS — BP 120/88 | HR 88 | Temp 98.0°F | Wt 189.2 lb

## 2023-08-22 DIAGNOSIS — Z09 Encounter for follow-up examination after completed treatment for conditions other than malignant neoplasm: Secondary | ICD-10-CM

## 2023-08-22 DIAGNOSIS — S0502XD Injury of conjunctiva and corneal abrasion without foreign body, left eye, subsequent encounter: Secondary | ICD-10-CM

## 2023-08-22 MED ORDER — CYCLOBENZAPRINE HCL 5 MG PO TABS: 5.0000 mg | ORAL_TABLET | Freq: Every evening | ORAL | 1 refills | Status: DC | PRN

## 2023-08-22 NOTE — Progress Notes (Signed)
 Established Patient Office Visit     CC/Reason for Visit: ED follow-up, work note  HPI: Marisa Sherman is a 63 y.o. female who is coming in today for the above mentioned reasons.  She was seen in the emergency department due to left eye pain.  She works at [employer] and feels like something came into her eye and she spent that day at work rubbing it.  Due to pain and swelling she presented to the ED where she was diagnosed with a possible corneal abrasion.  She was given erythromycin  ointment and ofloxacin  drops.  She was asked to schedule a follow-up with ophthalmology which she has not yet done.  She is requesting a work note to go back on Thursday.   Past Medical/Surgical History: Past Medical History:  Diagnosis Date   Abnormal Pap smear of cervix    Allergy    Anxiety    Bipolar disorder (HCC)    Cataract    Complication of anesthesia    Degenerative disc disease at L5-S1 level    Depression    GERD (gastroesophageal reflux disease)    Gout    Hyperlipidemia    Hypertension    Overactive bladder    PONV (postoperative nausea and vomiting)     Past Surgical History:  Procedure Laterality Date   ANTERIOR CERVICAL DECOMP/DISCECTOMY FUSION N/A 03/05/2021   Procedure: ACDF - C5-C6 - C6-C7;  Surgeon: Louis Shove, MD;  Location: [facility] OR;  Service: Neurosurgery;  Laterality: N/A;  3C   CARPAL TUNNEL RELEASE Bilateral    CERVIX LESION DESTRUCTION  1985   CHOLECYSTECTOMY     DILATION AND CURETTAGE OF UTERUS     EYE SURGERY Bilateral    cataract removal   TONSILLECTOMY     TUBAL LIGATION      Social History:  reports that she has been smoking cigarettes. She has a 48 pack-year smoking history. She has been exposed to tobacco smoke. She has never used smokeless tobacco. She reports that she does not currently use alcohol. She reports that she does not currently use drugs.  Allergies: Allergies  Allergen Reactions   Codeine Nausea Only   Diphenhydramine Hcl Other (See  Comments)    Pt unsure why   Lithium  Itching   Sulfa Antibiotics Nausea And Vomiting   Amoxicillin  Nausea And Vomiting    Family History:  Family History  Problem Relation Age of Onset   Depression Mother    Anxiety disorder Mother    Asthma Mother    Bipolar disorder Father    Depression Sister    Anxiety disorder Sister    Depression Brother    Anxiety disorder Brother    Depression Brother    Anxiety disorder Brother    Cancer Neg Hx    Diabetes Neg Hx    Heart disease Neg Hx    Colon cancer Neg Hx    Breast cancer Neg Hx    Ovarian cancer Neg Hx    Cervical cancer Neg Hx    Colon polyps Neg Hx      Current Outpatient Medications:    acetaminophen  (TYLENOL ) 500 MG tablet, Take 1,000 mg by mouth every 6 (six) hours as needed for mild pain., Disp: , Rfl:    allopurinol  (ZYLOPRIM ) 100 MG tablet, TAKE 1 TABLET BY MOUTH DAILY, Disp: 100 tablet, Rfl: 1   aspirin  EC 81 MG tablet, Take 81 mg by mouth every evening., Disp: , Rfl:    atorvastatin  (LIPITOR) 40 MG  tablet, TAKE 1 TABLET BY MOUTH DAILY AT  6 PM, Disp: 100 tablet, Rfl: 1   buPROPion  (WELLBUTRIN  XL) 150 MG 24 hr tablet, TAKE 1 TABLET BY MOUTH DAILY, Disp: 90 tablet, Rfl: 3   cyclobenzaprine  (FLEXERIL ) 5 MG tablet, Take 1 tablet (5 mg total) by mouth 3 (three) times daily as needed for muscle spasms., Disp: 30 tablet, Rfl: 0   cyclobenzaprine  (FLEXERIL ) 5 MG tablet, Take 1 tablet (5 mg total) by mouth at bedtime as needed for muscle spasms., Disp: 30 tablet, Rfl: 1   FLUoxetine  (PROZAC ) 20 MG capsule, Take 3 capsules (60 mg total) by mouth daily., Disp: 90 capsule, Rfl: 0   HYDROcodone -acetaminophen  (NORCO/VICODIN) 5-325 MG tablet, Take 1 tablet by mouth every 4 (four) hours as needed for up to 8 doses., Disp: 8 tablet, Rfl: 0   hydrOXYzine  (VISTARIL ) 50 MG capsule, TAKE 1 CAPSULE BY MOUTH EVERY 8  HOURS AS NEEDED FOR ANXIETY, Disp: 180 capsule, Rfl: 1   lisinopril  (ZESTRIL ) 40 MG tablet, TAKE 1 TABLET BY MOUTH DAILY,  Disp: 100 tablet, Rfl: 0   meloxicam  (MOBIC ) 7.5 MG tablet, TAKE 1 TABLET(7.5 MG) BY MOUTH DAILY, Disp: 30 tablet, Rfl: 0   ofloxacin  (OCUFLOX ) 0.3 % ophthalmic solution, Place 2 drops into the left eye 4 (four) times daily for 7 days., Disp: 5 mL, Rfl: 0   pantoprazole  (PROTONIX ) 40 MG tablet, TAKE 1 TABLET BY MOUTH TWICE  DAILY, Disp: 200 tablet, Rfl: 1   QUEtiapine  Fumarate (SEROQUEL  XR) 150 MG 24 hr tablet, Take 1 tablet (150 mg total) by mouth at bedtime., Disp: 30 tablet, Rfl: 11   traZODone  (DESYREL ) 150 MG tablet, Take 1 tablet (150 mg total) by mouth at bedtime as needed for sleep., Disp: 30 tablet, Rfl: 0   methocarbamol  (ROBAXIN ) 500 MG tablet, Take 1 tablet (500 mg total) by mouth 2 (two) times daily. (Patient not taking: Reported on 08/22/2023), Disp: 20 tablet, Rfl: 0  Review of Systems:  Negative unless indicated in HPI.   Physical Exam: Vitals:   08/22/23 1454  BP: 120/88  Pulse: 88  Temp: 98 F (36.7 C)  TempSrc: Oral  SpO2: 97%  Weight: 189 lb 3.2 oz (85.8 kg)    Body mass index is 30.54 kg/m.   Physical Exam Eyes:     Conjunctiva/sclera:     Left eye: Left conjunctiva is injected. Hemorrhage present.      Impression and Plan:  Hospital discharge follow-up  Abrasion of left cornea, subsequent encounter   - Work note provided, have advised her to call ophthalmology for follow-up appointment.  Time spent:20 minutes reviewing chart, interviewing and examining patient and formulating plan of care.     Tully Theophilus Andrews, MD [facility]

## 2023-08-23 DIAGNOSIS — S0502XA Injury of conjunctiva and corneal abrasion without foreign body, left eye, initial encounter: Secondary | ICD-10-CM | POA: Diagnosis not present

## 2023-08-24 DIAGNOSIS — S0502XD Injury of conjunctiva and corneal abrasion without foreign body, left eye, subsequent encounter: Secondary | ICD-10-CM | POA: Diagnosis not present

## 2023-08-25 DIAGNOSIS — S0502XD Injury of conjunctiva and corneal abrasion without foreign body, left eye, subsequent encounter: Secondary | ICD-10-CM | POA: Diagnosis not present

## 2023-08-28 ENCOUNTER — Telehealth (HOSPITAL_COMMUNITY): Payer: Self-pay | Admitting: Student in an Organized Health Care Education/Training Program

## 2023-08-28 DIAGNOSIS — S0502XD Injury of conjunctiva and corneal abrasion without foreign body, left eye, subsequent encounter: Secondary | ICD-10-CM | POA: Diagnosis not present

## 2023-08-31 ENCOUNTER — Other Ambulatory Visit: Payer: Self-pay | Admitting: Internal Medicine

## 2023-08-31 DIAGNOSIS — I10 Essential (primary) hypertension: Secondary | ICD-10-CM

## 2023-09-01 ENCOUNTER — Encounter (HOSPITAL_COMMUNITY): Payer: Self-pay | Admitting: Physician Assistant

## 2023-09-01 ENCOUNTER — Ambulatory Visit (HOSPITAL_COMMUNITY): Admitting: Physician Assistant

## 2023-09-01 DIAGNOSIS — F17210 Nicotine dependence, cigarettes, uncomplicated: Secondary | ICD-10-CM | POA: Diagnosis not present

## 2023-09-01 DIAGNOSIS — F1994 Other psychoactive substance use, unspecified with psychoactive substance-induced mood disorder: Secondary | ICD-10-CM

## 2023-09-01 DIAGNOSIS — F419 Anxiety disorder, unspecified: Secondary | ICD-10-CM

## 2023-09-01 DIAGNOSIS — F4310 Post-traumatic stress disorder, unspecified: Secondary | ICD-10-CM

## 2023-09-01 DIAGNOSIS — G4700 Insomnia, unspecified: Secondary | ICD-10-CM

## 2023-09-01 DIAGNOSIS — S0502XD Injury of conjunctiva and corneal abrasion without foreign body, left eye, subsequent encounter: Secondary | ICD-10-CM | POA: Diagnosis not present

## 2023-09-01 MED ORDER — QUETIAPINE FUMARATE ER 200 MG PO TB24: 200.0000 mg | ORAL_TABLET | Freq: Every day | ORAL | 2 refills | Status: DC

## 2023-09-01 MED ORDER — BUPROPION HCL ER (XL) 150 MG PO TB24: 150.0000 mg | ORAL_TABLET | Freq: Every day | ORAL | 2 refills | Status: DC

## 2023-09-01 MED ORDER — HYDROXYZINE PAMOATE 50 MG PO CAPS: ORAL_CAPSULE | ORAL | 2 refills | Status: DC

## 2023-09-01 MED ORDER — TRAZODONE HCL 150 MG PO TABS: 150.0000 mg | ORAL_TABLET | Freq: Every evening | ORAL | 2 refills | Status: DC | PRN

## 2023-09-01 MED ORDER — FLUOXETINE HCL 40 MG PO CAPS: 80.0000 mg | ORAL_CAPSULE | Freq: Every day | ORAL | 2 refills | Status: DC

## 2023-09-01 NOTE — Progress Notes (Signed)
 [facility] MD/PA/NP OP Progress Note  09/01/2023 9:45 AM Marisa Sherman  MRN:  [id number]  Chief Complaint:  Chief Complaint  Patient presents with   Follow-up   Medication Management   HPI:   Marisa Sherman is a 63 year old female with a past psychiatric history significant for PTSD, substance-induced mood disorder, insomnia, and anxiety who presents to [facility] to reestablish psychiatric care and for medication management.  Patient was last seen by Reggie WENDI Rice, MD on 02/24/2023.  During her last encounter, patient was being managed on the following psychiatric medications:  Prozac  60 mg daily Hydroxyzine  50 mg 3 times daily as needed Seroquel  XR 150 mg at bedtime Trazodone  150 mg at bedtime as needed Wellbutrin  XL 150 mg daily  Patient presents to the encounter requesting refills on her medication regimen.  She reports that she recently ran out of her hydroxyzine  prescription.  She also notes that she has not been sleeping well.  Patient reports that she receives on average 4 hours of sleep per night.  In addition to experiencing decreased sleep, patient reports that she wakes up throughout the night.  In regards to her mood, patient describes her mood as being pissy.  Patient rates her depression a 5 out of 10 with 10 being most severe.  Patient endorses depressive episodes 3 to 4 days/week.  Patient reports that her use of prozac  used to keep her upbeat but now she finds herself being more crabby and bitchy. Patient endorses the following depressive symptoms: feelings of sadness, lack of motivation, decreased energy, irritability, feelings of guilt/worthlessness, and hopelessness.  Patient denies decreased energy.  In addition to depression, patient endorses anxiety and rates her anxiety a 7 out of 10.  Patient denies any specific triggers to her anxiety.  Patient reports that she has been clean off of drugs for 3-1/2 years and reports that she attends  NA meetings regularly.  She reports that she recently moved into a new [facility] after not doing well with the residents at her previous [facility].  Patient reports that she feels at ease at the new [place] [facility] that she is staying in.  A PHQ-9 screen was performed with the patient scoring a 12.  A GAD-7 screen was also performed with the patient scoring a 19.  Patient is alert and oriented x 4, cooperative, and fully engaged in conversation during the encounter.  Patient endorses good mood.  Patient exhibits depressed mood with appropriate affect.  Patient denies suicidal or homicidal ideations.  She further denies auditory or visual hallucinations and does not appear to be responding to internal/external stimuli.  Patient endorses poor sleep and receives on average 4 hours of sleep per night.  Patient endorses fair appetite and eats on average 2 meals per day.  Patient denies alcohol consumption or illicit drug use.  Patient endorses tobacco use and smokes on hours a pack per day.  Visit Diagnosis:    ICD-10-CM   1. Anxiety  F41.9 hydrOXYzine  (VISTARIL ) 50 MG capsule    QUEtiapine  Fumarate (SEROQUEL  XR) 200 MG 24 hr tablet    FLUoxetine  (PROZAC ) 40 MG capsule    2. PTSD (post-traumatic stress disorder)  F43.10 traZODone  (DESYREL ) 150 MG tablet    FLUoxetine  (PROZAC ) 40 MG capsule    3. Insomnia, unspecified type  G47.00 traZODone  (DESYREL ) 150 MG tablet    4. Cigarette nicotine  dependence without complication  F17.210 buPROPion  (WELLBUTRIN  XL) 150 MG 24 hr tablet    5.  Substance induced mood disorder (HCC)  F19.94 QUEtiapine  Fumarate (SEROQUEL  XR) 200 MG 24 hr tablet    FLUoxetine  (PROZAC ) 40 MG capsule      Past Psychiatric History:  Bipolar d/o Substance induced mood disorder PTSD Anxiety Was being seen at [facility]iatry [facility] in [place] for past 4-5 years   Past Medical History:  Past Medical History:  Diagnosis Date   Abnormal Pap smear of cervix    Allergy     Anxiety    Bipolar disorder (HCC)    Cataract    Complication of anesthesia    Degenerative disc disease at L5-S1 level    Depression    GERD (gastroesophageal reflux disease)    Gout    Hyperlipidemia    Hypertension    Overactive bladder    PONV (postoperative nausea and vomiting)     Past Surgical History:  Procedure Laterality Date   ANTERIOR CERVICAL DECOMP/DISCECTOMY FUSION N/A 03/05/2021   Procedure: ACDF - C5-C6 - C6-C7;  Surgeon: Louis Shove, MD;  Location: [facility] OR;  Service: Neurosurgery;  Laterality: N/A;  3C   CARPAL TUNNEL RELEASE Bilateral    CERVIX LESION DESTRUCTION  1985   CHOLECYSTECTOMY     DILATION AND CURETTAGE OF UTERUS     EYE SURGERY Bilateral    cataract removal   TONSILLECTOMY     TUBAL LIGATION      Family Psychiatric History:  History of depression and anxiety in mother She also informed history of anxiety and depression in her siblings Father had a gambling issue.   Family History:  Family History  Problem Relation Age of Onset   Depression Mother    Anxiety disorder Mother    Asthma Mother    Bipolar disorder Father    Depression Sister    Anxiety disorder Sister    Depression Brother    Anxiety disorder Brother    Depression Brother    Anxiety disorder Brother    Cancer Neg Hx    Diabetes Neg Hx    Heart disease Neg Hx    Colon cancer Neg Hx    Breast cancer Neg Hx    Ovarian cancer Neg Hx    Cervical cancer Neg Hx    Colon polyps Neg Hx     Social History:  Social History   Socioeconomic History   Marital status: Divorced    Spouse name: Not on file   Number of children: 3   Years of education: Not on file   Highest education level: GED or equivalent  Occupational History   Occupation: Disablilty  Tobacco Use   Smoking status: Every Day    Current packs/day: 1.00    Average packs/day: 1 pack/day for 48.0 years (48.0 ttl pk-yrs)    Types: Cigarettes    Passive exposure: Current   Smokeless tobacco: Never   Tobacco  comments:    Pt smokes 1 pack per day 12/30/21 PAP  Vaping Use   Vaping status: Never Used  Substance and Sexual Activity   Alcohol use: Not Currently   Drug use: Not Currently    Comment: 90 days recovery   Sexual activity: Not Currently  Other Topics Concern   Not on file  Social History Narrative   Not on file   Social Drivers of Health   Financial Resource Strain: Low Risk  (07/08/2020)   Overall Financial Resource Strain (CARDIA)    Difficulty of Paying Living Expenses: Not hard at all  Food Insecurity: No Food Insecurity (08/25/2020)  Hunger Vital Sign    Worried About Running Out of Food in the Last Year: Never true    Ran Out of Food in the Last Year: Never true  Transportation Needs: No Transportation Needs (08/25/2020)   PRAPARE - Administrator, Civil Service (Medical): No    Lack of Transportation (Non-Medical): No  Physical Activity: Sufficiently Active (07/08/2020)   Exercise Vital Sign    Days of Exercise per Week: 7 days    Minutes of Exercise per Session: 30 min  Stress: Stress Concern Present (07/08/2020)   [employer] of Occupational Health - Occupational Stress Questionnaire    Feeling of Stress : Rather much  Social Connections: Moderately Isolated (07/08/2020)   Social Connection and Isolation Panel    Frequency of Communication with Friends and Family: Twice a week    Frequency of Social Gatherings with Friends and Family: Never    Attends Religious Services: 1 to 4 times per year    Active Member of [employer] or Organizations: Yes    Attends Engineer, structural: More than 4 times per year    Marital Status: Divorced    Allergies:  Allergies  Allergen Reactions   Codeine Nausea Only   Diphenhydramine Hcl Other (See Comments)    Pt unsure why   Lithium  Itching   Sulfa Antibiotics Nausea And Vomiting   Amoxicillin  Nausea And Vomiting    Metabolic Disorder Labs: Lab Results  Component Value Date   HGBA1C 5.9 (A) 03/09/2022    No results found for: PROLACTIN Lab Results  Component Value Date   CHOL 172 11/24/2021   TRIG 201.0 (H) 11/24/2021   HDL 39.40 11/24/2021   CHOLHDL 4 11/24/2021   VLDL 40.2 (H) 11/24/2021   LDLCALC 98 04/13/2020   LDLCALC 90 04/01/2019   Lab Results  Component Value Date   TSH 2.610 04/13/2020   TSH 1.720 07/05/2013    Therapeutic Level Labs: Lab Results  Component Value Date   LITHIUM  0.50 (L) 07/10/2013   LITHIUM  <0.25 (L) 07/01/2013   No results found for: VALPROATE Lab Results  Component Value Date   CBMZ 7.6 07/10/2013    Current Medications: Current Outpatient Medications  Medication Sig Dispense Refill   acetaminophen  (TYLENOL ) 500 MG tablet Take 1,000 mg by mouth every 6 (six) hours as needed for mild pain.     allopurinol  (ZYLOPRIM ) 100 MG tablet TAKE 1 TABLET BY MOUTH DAILY 100 tablet 1   aspirin  EC 81 MG tablet Take 81 mg by mouth every evening.     atorvastatin  (LIPITOR) 40 MG tablet TAKE 1 TABLET BY MOUTH DAILY AT  6 PM 100 tablet 1   buPROPion  (WELLBUTRIN  XL) 150 MG 24 hr tablet Take 1 tablet (150 mg total) by mouth daily. 30 tablet 2   cyclobenzaprine  (FLEXERIL ) 5 MG tablet Take 1 tablet (5 mg total) by mouth 3 (three) times daily as needed for muscle spasms. 30 tablet 0   cyclobenzaprine  (FLEXERIL ) 5 MG tablet Take 1 tablet (5 mg total) by mouth at bedtime as needed for muscle spasms. 30 tablet 1   FLUoxetine  (PROZAC ) 40 MG capsule Take 2 capsules (80 mg total) by mouth daily. 60 capsule 2   HYDROcodone -acetaminophen  (NORCO/VICODIN) 5-325 MG tablet Take 1 tablet by mouth every 4 (four) hours as needed for up to 8 doses. 8 tablet 0   hydrOXYzine  (VISTARIL ) 50 MG capsule TAKE 1 CAPSULE BY MOUTH EVERY 8  HOURS AS NEEDED FOR ANXIETY 90 capsule 2  lisinopril  (ZESTRIL ) 40 MG tablet TAKE 1 TABLET BY MOUTH DAILY 100 tablet 0   meloxicam  (MOBIC ) 7.5 MG tablet TAKE 1 TABLET(7.5 MG) BY MOUTH DAILY 30 tablet 0   methocarbamol  (ROBAXIN ) 500 MG tablet Take 1  tablet (500 mg total) by mouth 2 (two) times daily. (Patient not taking: Reported on 08/22/2023) 20 tablet 0   pantoprazole  (PROTONIX ) 40 MG tablet TAKE 1 TABLET BY MOUTH TWICE  DAILY 200 tablet 1   QUEtiapine  Fumarate (SEROQUEL  XR) 200 MG 24 hr tablet Take 1 tablet (200 mg total) by mouth at bedtime. 30 tablet 2   traZODone  (DESYREL ) 150 MG tablet Take 1 tablet (150 mg total) by mouth at bedtime as needed for sleep. 30 tablet 2   No current facility-administered medications for this visit.     Musculoskeletal: Strength & Muscle Tone: within normal limits Gait & Station: normal Patient leans: N/A  Psychiatric Specialty Exam: Review of Systems  Psychiatric/Behavioral:  Positive for dysphoric mood and sleep disturbance. Negative for decreased concentration, hallucinations, self-injury and suicidal ideas. The patient is nervous/anxious. The patient is not hyperactive.     Blood pressure (!) 150/91, pulse 70, temperature (!) 97.5 F (36.4 C), temperature source Oral, height 5' 6 (1.676 m), weight 192 lb (87.1 kg), SpO2 100%.Body mass index is 30.99 kg/m.  General Appearance: Casual  Eye Contact:  Good  Speech:  Clear and Coherent and Normal Rate  Volume:  Normal  Mood:  Anxious and Depressed  Affect:  Congruent  Thought Process:  Coherent, Goal Directed, and Descriptions of Associations: Intact  Orientation:  Full (Time, Place, and Person)  Thought Content: WDL   Suicidal Thoughts:  No  Homicidal Thoughts:  No  Memory:  Immediate;   Good Recent;   Good Remote;   Good  Judgement:  Good  Insight:  Good  Psychomotor Activity:  Normal  Concentration:  Concentration: Good and Attention Span: Good  Recall:  Good  Fund of Knowledge: Good  Language: Good  Akathisia:  No  Handed:  Right  AIMS (if indicated): not done  Assets:  Communication Skills Desire for Improvement Housing Leisure Time Resilience Social Support  ADL's:  Intact  Cognition: WNL  Sleep:  Poor    Screenings: AIMS    Flowsheet Row Clinical Support from 09/01/2023 in [facility]  AIMS Total Score 1   AUDIT    Flowsheet Row [facility]  Alcohol Use Disorder Identification Test Final Score (AUDIT) 0   GAD-7    Flowsheet Row Clinical Support from 09/01/2023 in [facility] Office Visit from 08/22/2023 in [facility] [place] Counselor from 03/29/2023 in [facility] Office Visit from 03/02/2023 in [facility] [place] Office Visit from 03/09/2022 in [facility] [place]  Total GAD-7 Score 19 3 12  0 0   PHQ2-9    Flowsheet Row Clinical Support from 09/01/2023 in [facility] Office Visit from 08/22/2023 in [facility] [place] Counselor from 03/29/2023 in [facility] Office Visit from 03/02/2023 in [facility] [place] Office Visit from 06/27/2022 in [facility] [place]  PHQ-2 Total Score 2 0 4 0 2  PHQ-9 Total Score 12 4 6 6 4    Flowsheet Row Clinical Support from 09/01/2023 in [facility] ED from 08/18/2023 in [facility] at [facility] ED from 07/16/2023 in [facility] at [facility]  C-SSRS RISK CATEGORY Moderate Risk No Risk No Risk     Assessment and Plan:   Marisa Sherman is a 63 year old female with a past psychiatric history significant for PTSD, substance-induced mood disorder, insomnia, and anxiety who presents to [facility] to reestablish psychiatric care and for medication management.  Due to her use of Seroquel , an aims assessment was performed with the patient scoring a 1.  Patient reports that she  occasionally experiences jerking movements in her hands, especially when holding things.  Patient endorses minimal distress over these movements and denies the need for medication management at this time.  Patient presents to the encounter requesting refills on her current medication regimen.  She reports that she is currently out of her hydroxyzine  prescription.  Patient notes that she has been having issues with sleep and may receive roughly 4 hours of intermittent sleep per night.  Patient reports that her use of Seroquel  has been effective in providing her with sleep.  Provider recommended increasing her Seroquel  dosage from 150 mg to 200 mg at bedtime for the management of her sleep and for mood stability.  Patient was agreeable to recommendation.  Patient reports that she still continues to experience depression characterized by irritable mood.  She also endorses worsening anxiety with no specific triggers.  A PHQ-9 screen was performed with the patient scoring a 12.  A GAD-7 screen was also performed with the patient scoring a 19.  In the past, patient reports that her use of Prozac  was helpful in managing her mood.  Provider recommended increasing her Prozac  dosage from 60 mg to 40 mg 2 times daily for the management of her depressive symptoms and anxiety.  Patient was agreeable to recommendations.  Patient to continue taking all other medications as prescribed.  Patient's medications to be e-prescribed to pharmacy of choice.  Patient has several labs pending.  A [place] Suicide Severity Rating Scale was performed with the patient being considered moderate risk.  Patient denies suicidal ideations and is able to contract for safety at this time.  Safety planning was discussed with the patient prior to the conclusion of the encounter.  - Patient was instructed to contact 911 in the event of a mental health crisis. - Patient was instructed to contact [address] Su[facility] and Crisis [facility] in the event of a  mental health crisis. - Patient was instructed to present to [facility] in the event of a mental health crisis.  Collaboration of Care: Collaboration of Care: Medication Management AEB provider managing patient's psychiatric medications, Primary Care Provider AEB patient being seen by a family medicine provider, and Psychiatrist AEB patient being followed by a mental health provider at this facility  Patient/Guardian was advised Release of Information must be obtained prior to any record release in order to collaborate their care with an outside provider. Patient/Guardian was advised if they have not already done so to contact the registration department to sign all necessary forms in order for us  to release information regarding their care.   Consent: Patient/Guardian gives verbal consent for treatment and assignment of benefits for services provided during this visit. Patient/Guardian expressed understanding and agreed to proceed.   1. Anxiety  - hydrOXYzine  (VISTARIL ) 50 MG capsule; TAKE 1 CAPSULE BY MOUTH EVERY 8  HOURS AS NEEDED FOR ANXIETY  Dispense: 90 capsule; Refill: 2 -  QUEtiapine  Fumarate (SEROQUEL  XR) 200 MG 24 hr tablet; Take 1 tablet (200 mg total) by mouth at bedtime.  Dispense: 30 tablet; Refill: 2 - FLUoxetine  (PROZAC ) 40 MG capsule; Take 2 capsules (80 mg total) by mouth daily.  Dispense: 60 capsule; Refill: 2  2. PTSD (post-traumatic stress disorder)  - traZODone  (DESYREL ) 150 MG tablet; Take 1 tablet (150 mg total) by mouth at bedtime as needed for sleep.  Dispense: 30 tablet; Refill: 2 - FLUoxetine  (PROZAC ) 40 MG capsule; Take 2 capsules (80 mg total) by mouth daily.  Dispense: 60 capsule; Refill: 2  3. Insomnia, unspecified type  - traZODone  (DESYREL ) 150 MG tablet; Take 1 tablet (150 mg total) by mouth at bedtime as needed for sleep.  Dispense: 30 tablet; Refill: 2  4. Cigarette nicotine  dependence without complication  - buPROPion   (WELLBUTRIN  XL) 150 MG 24 hr tablet; Take 1 tablet (150 mg total) by mouth daily.  Dispense: 30 tablet; Refill: 2  5. Substance induced mood disorder (HCC)  - QUEtiapine  Fumarate (SEROQUEL  XR) 200 MG 24 hr tablet; Take 1 tablet (200 mg total) by mouth at bedtime.  Dispense: 30 tablet; Refill: 2 - FLUoxetine  (PROZAC ) 40 MG capsule; Take 2 capsules (80 mg total) by mouth daily.  Dispense: 60 capsule; Refill: 2  Patient to follow up in 6 weeks with Sahil Kapoor, MD Provider spent a total of 33 minutes with the patient/reviewing patient's chart  Marisa FORBES Bolster, PA 09/01/2023, 9:45 AM

## 2023-09-04 DIAGNOSIS — S0502XD Injury of conjunctiva and corneal abrasion without foreign body, left eye, subsequent encounter: Secondary | ICD-10-CM | POA: Diagnosis not present

## 2023-09-04 DIAGNOSIS — H0288B Meibomian gland dysfunction left eye, upper and lower eyelids: Secondary | ICD-10-CM | POA: Diagnosis not present

## 2023-09-04 DIAGNOSIS — Z961 Presence of intraocular lens: Secondary | ICD-10-CM | POA: Diagnosis not present

## 2023-09-04 DIAGNOSIS — H0288A Meibomian gland dysfunction right eye, upper and lower eyelids: Secondary | ICD-10-CM | POA: Diagnosis not present

## 2023-09-07 DIAGNOSIS — S0502XD Injury of conjunctiva and corneal abrasion without foreign body, left eye, subsequent encounter: Secondary | ICD-10-CM | POA: Diagnosis not present

## 2023-09-11 ENCOUNTER — Ambulatory Visit: Admitting: Internal Medicine

## 2023-09-11 ENCOUNTER — Encounter: Payer: Self-pay | Admitting: Internal Medicine

## 2023-09-11 VITALS — BP 140/98 | HR 82 | Temp 98.3°F | Wt 191.1 lb

## 2023-09-11 DIAGNOSIS — M542 Cervicalgia: Secondary | ICD-10-CM | POA: Diagnosis not present

## 2023-09-11 DIAGNOSIS — I10 Essential (primary) hypertension: Secondary | ICD-10-CM | POA: Diagnosis not present

## 2023-09-11 DIAGNOSIS — M545 Low back pain, unspecified: Secondary | ICD-10-CM

## 2023-09-11 DIAGNOSIS — S0502XD Injury of conjunctiva and corneal abrasion without foreign body, left eye, subsequent encounter: Secondary | ICD-10-CM | POA: Diagnosis not present

## 2023-09-11 DIAGNOSIS — H0288B Meibomian gland dysfunction left eye, upper and lower eyelids: Secondary | ICD-10-CM | POA: Diagnosis not present

## 2023-09-11 DIAGNOSIS — H0288A Meibomian gland dysfunction right eye, upper and lower eyelids: Secondary | ICD-10-CM | POA: Diagnosis not present

## 2023-09-11 MED ORDER — AMLODIPINE BESYLATE 5 MG PO TABS: 5.0000 mg | ORAL_TABLET | Freq: Every day | ORAL | 1 refills | Status: AC

## 2023-09-11 MED ORDER — CYCLOBENZAPRINE HCL 5 MG PO TABS: 5.0000 mg | ORAL_TABLET | Freq: Three times a day (TID) | ORAL | 0 refills | Status: DC | PRN

## 2023-09-11 NOTE — Progress Notes (Signed)
 Established Patient Office Visit     CC/Reason for Visit: Follow-up blood pressure sure  HPI: Marisa Sherman is a 63 y.o. female who is coming in today for the above mentioned reasons. Past Medical History is significant for: Hypertension.  Her blood pressure was noted to be elevated at last visit she was asked to monitor and return.  Both in office measurements and home measurements have been hovering around the 140 150 systolic with diastolics in the upper 90s.  She has been adherent with her lisinopril  40 mg daily.   Past Medical/Surgical History: Past Medical History:  Diagnosis Date   Abnormal Pap smear of cervix    Allergy    Anxiety    Bipolar disorder (HCC)    Cataract    Complication of anesthesia    Degenerative disc disease at L5-S1 level    Depression    GERD (gastroesophageal reflux disease)    Gout    Hyperlipidemia    Hypertension    Overactive bladder    PONV (postoperative nausea and vomiting)     Past Surgical History:  Procedure Laterality Date   ANTERIOR CERVICAL DECOMP/DISCECTOMY FUSION N/A 03/05/2021   Procedure: ACDF - C5-C6 - C6-C7;  Surgeon: Louis Shove, MD;  Location: [facility];  Service: Neurosurgery;  Laterality: N/A;  3C   CARPAL TUNNEL RELEASE Bilateral    CERVIX LESION DESTRUCTION  1985   CHOLECYSTECTOMY     DILATION AND CURETTAGE OF UTERUS     EYE SURGERY Bilateral    cataract removal   TONSILLECTOMY     TUBAL LIGATION      Social History:  reports that she has been smoking cigarettes. She has a 48 pack-year smoking history. She has been exposed to tobacco smoke. She has never used smokeless tobacco. She reports that she does not currently use alcohol. She reports that she does not currently use drugs.  Allergies: Allergies  Allergen Reactions   Codeine Nausea Only   Diphenhydramine Hcl Other (See Comments)    Pt unsure why   Lithium  Itching   Sulfa Antibiotics Nausea And Vomiting   Amoxicillin  Nausea And Vomiting    Family  History:  Family History  Problem Relation Age of Onset   Depression Mother    Anxiety disorder Mother    Asthma Mother    Bipolar disorder Father    Depression Sister    Anxiety disorder Sister    Depression Brother    Anxiety disorder Brother    Depression Brother    Anxiety disorder Brother    Cancer Neg Hx    Diabetes Neg Hx    Heart disease Neg Hx    Colon cancer Neg Hx    Breast cancer Neg Hx    Ovarian cancer Neg Hx    Cervical cancer Neg Hx    Colon polyps Neg Hx      Current Outpatient Medications:    acetaminophen  (TYLENOL ) 500 MG tablet, Take 1,000 mg by mouth every 6 (six) hours as needed for mild pain., Disp: , Rfl:    allopurinol  (ZYLOPRIM ) 100 MG tablet, TAKE 1 TABLET BY MOUTH DAILY, Disp: 100 tablet, Rfl: 1   amLODipine  (NORVASC ) 5 MG tablet, Take 1 tablet (5 mg total) by mouth daily., Disp: 90 tablet, Rfl: 1   aspirin  EC 81 MG tablet, Take 81 mg by mouth every evening., Disp: , Rfl:    atorvastatin  (LIPITOR) 40 MG tablet, TAKE 1 TABLET BY MOUTH DAILY AT  6 PM, Disp:  100 tablet, Rfl: 1   buPROPion  (WELLBUTRIN  XL) 150 MG 24 hr tablet, Take 1 tablet (150 mg total) by mouth daily., Disp: 30 tablet, Rfl: 2   FLUoxetine  (PROZAC ) 40 MG capsule, Take 2 capsules (80 mg total) by mouth daily., Disp: 60 capsule, Rfl: 2   HYDROcodone -acetaminophen  (NORCO/VICODIN) 5-325 MG tablet, Take 1 tablet by mouth every 4 (four) hours as needed for up to 8 doses., Disp: 8 tablet, Rfl: 0   hydrOXYzine  (VISTARIL ) 50 MG capsule, TAKE 1 CAPSULE BY MOUTH EVERY 8  HOURS AS NEEDED FOR ANXIETY, Disp: 90 capsule, Rfl: 2   lisinopril  (ZESTRIL ) 40 MG tablet, TAKE 1 TABLET BY MOUTH DAILY, Disp: 100 tablet, Rfl: 0   pantoprazole  (PROTONIX ) 40 MG tablet, TAKE 1 TABLET BY MOUTH TWICE  DAILY, Disp: 200 tablet, Rfl: 1   QUEtiapine  Fumarate (SEROQUEL  XR) 200 MG 24 hr tablet, Take 1 tablet (200 mg total) by mouth at bedtime., Disp: 30 tablet, Rfl: 2   traZODone  (DESYREL ) 150 MG tablet, Take 1 tablet (150  mg total) by mouth at bedtime as needed for sleep., Disp: 30 tablet, Rfl: 2   cyclobenzaprine  (FLEXERIL ) 5 MG tablet, Take 1 tablet (5 mg total) by mouth at bedtime as needed for muscle spasms., Disp: 30 tablet, Rfl: 1   cyclobenzaprine  (FLEXERIL ) 5 MG tablet, Take 1 tablet (5 mg total) by mouth 3 (three) times daily as needed for muscle spasms., Disp: 30 tablet, Rfl: 0   meloxicam  (MOBIC ) 7.5 MG tablet, TAKE 1 TABLET(7.5 MG) BY MOUTH DAILY (Patient not taking: Reported on 09/11/2023), Disp: 30 tablet, Rfl: 0   methocarbamol  (ROBAXIN ) 500 MG tablet, Take 1 tablet (500 mg total) by mouth 2 (two) times daily. (Patient not taking: Reported on 09/11/2023), Disp: 20 tablet, Rfl: 0  Review of Systems:  Negative unless indicated in HPI.   Physical Exam: Vitals:   09/11/23 1501 09/11/23 1504  BP: (!) 140/98 (!) 140/98  Pulse: 82   Temp: 98.3 F (36.8 C)   TempSrc: Oral   SpO2: 98%   Weight: 191 lb 1.6 oz (86.7 kg)     Body mass index is 30.84 kg/m.   Physical Exam Vitals reviewed.  Constitutional:      Appearance: Normal appearance. She is obese.  HENT:     Head: Normocephalic and atraumatic.  Eyes:     Conjunctiva/sclera: Conjunctivae normal.  Cardiovascular:     Rate and Rhythm: Normal rate and regular rhythm.  Pulmonary:     Effort: Pulmonary effort is normal.     Breath sounds: Normal breath sounds.  Skin:    General: Skin is warm and dry.  Neurological:     General: No focal deficit present.     Mental Status: She is alert and oriented to person, place, and time.  Psychiatric:        Mood and Affect: Mood normal.        Behavior: Behavior normal.        Thought Content: Thought content normal.        Judgment: Judgment normal.      Impression and Plan:  Essential hypertension -     amLODIPine  Besylate; Take 1 tablet (5 mg total) by mouth daily.  Dispense: 90 tablet; Refill: 1  Acute right-sided low back pain without sciatica -     Cyclobenzaprine  HCl; Take 1  tablet (5 mg total) by mouth 3 (three) times daily as needed for muscle spasms.  Dispense: 30 tablet; Refill: 0  Neck pain -  Cyclobenzaprine  HCl; Take 1 tablet (5 mg total) by mouth 3 (three) times daily as needed for muscle spasms.  Dispense: 30 tablet; Refill: 0   - Blood pressure is not well-controlled.  Continue lisinopril  40 mg and amlodipine  5 mg.  Return in 6 to 8 weeks for follow-up. - Refill Flexeril  that she takes for low back pain.  Time spent:30 minutes reviewing chart, interviewing and examining patient and formulating plan of care.     Tully Theophilus Andrews, MD  Primary Care at [facility]

## 2023-09-12 ENCOUNTER — Other Ambulatory Visit (HOSPITAL_COMMUNITY): Payer: Self-pay | Admitting: Physician Assistant

## 2023-09-12 DIAGNOSIS — F419 Anxiety disorder, unspecified: Secondary | ICD-10-CM

## 2023-09-12 DIAGNOSIS — G4700 Insomnia, unspecified: Secondary | ICD-10-CM

## 2023-09-12 DIAGNOSIS — F1994 Other psychoactive substance use, unspecified with psychoactive substance-induced mood disorder: Secondary | ICD-10-CM

## 2023-09-12 DIAGNOSIS — F4310 Post-traumatic stress disorder, unspecified: Secondary | ICD-10-CM

## 2023-09-15 DIAGNOSIS — Z961 Presence of intraocular lens: Secondary | ICD-10-CM | POA: Diagnosis not present

## 2023-09-15 DIAGNOSIS — H0288A Meibomian gland dysfunction right eye, upper and lower eyelids: Secondary | ICD-10-CM | POA: Diagnosis not present

## 2023-09-15 DIAGNOSIS — S0502XD Injury of conjunctiva and corneal abrasion without foreign body, left eye, subsequent encounter: Secondary | ICD-10-CM | POA: Diagnosis not present

## 2023-09-15 DIAGNOSIS — H0288B Meibomian gland dysfunction left eye, upper and lower eyelids: Secondary | ICD-10-CM | POA: Diagnosis not present

## 2023-09-18 ENCOUNTER — Encounter (INDEPENDENT_AMBULATORY_CARE_PROVIDER_SITE_OTHER): Admitting: Internal Medicine

## 2023-09-18 ENCOUNTER — Encounter: Payer: Self-pay | Admitting: Internal Medicine

## 2023-09-18 VITALS — BP 123/81 | HR 57 | Temp 98.1°F | Wt 188.2 lb

## 2023-09-18 DIAGNOSIS — R202 Paresthesia of skin: Secondary | ICD-10-CM | POA: Diagnosis not present

## 2023-09-18 NOTE — Progress Notes (Signed)
 This encounter was created in error - please disregard.

## 2023-09-19 ENCOUNTER — Ambulatory Visit: Payer: Self-pay | Admitting: Internal Medicine

## 2023-09-19 LAB — VITAMIN B12: Vitamin B-12: 347 pg/mL (ref 211–911)

## 2023-10-05 NOTE — Progress Notes (Deleted)
 [facility] MD/PA/NP OP Progress Note  10/05/2023 2:51 PM Marisa Sherman  MRN:  [id number]  Visit Diagnosis: No diagnosis found.  Assessment:  Marisa Sherman is a 63 year old female with a past psychiatric history significant for PTSD, substance-induced mood disorder, insomnia, and anxiety who presents to [facility] to reestablish psychiatric care and for medication management.  Due to her use of Seroquel , an aims assessment was performed with the patient scoring a 1.  Patient reports that she occasionally experiences jerking movements in her hands, especially when holding things.  Patient endorses minimal distress over these movements and denies the need for medication management at this time.   Patient presents to the encounter requesting refills on her current medication regimen.  She reports that she is currently out of her hydroxyzine  prescription.  Patient notes that she has been having issues with sleep and may receive roughly 4 hours of intermittent sleep per night.  Patient reports that her use of Seroquel  has been effective in providing her with sleep.  Provider recommended increasing her Seroquel  dosage from 150 mg to 200 mg at bedtime for the management of her sleep and for mood stability.  Patient was agreeable to recommendation.   Patient reports that she still continues to experience depression characterized by irritable mood.  She also endorses worsening anxiety with no specific triggers.  A PHQ-9 screen was performed with the patient scoring a 12.  A GAD-7 screen was also performed with the patient scoring a 19.  In the past, patient reports that her use of Prozac  was helpful in managing her mood.  Provider recommended increasing her Prozac  dosage from 60 mg to 40 mg 2 times daily for the management of her depressive symptoms and anxiety.  Patient was agreeable to recommendations.  Patient to continue taking all other medications as prescribed.  Patient's  medications to be e-prescribed to pharmacy of choice.  Risk Assessment: An assessment of suicide and violence risk factors was performed as part of this evaluation and is not significantly changed from the last visit. While future psychiatric events cannot be accurately predicted, the patient does not currently require acute inpatient psychiatric care and does not currently meet [place]  involuntary commitment criteria. Patient was given contact information for crisis resources, behavioral health clinic and was instructed to call 911 for emergencies.   Marisa Sherman presents for follow-up evaluation. 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  HPI:  Marisa Sherman is a 63 year old female with a past psychiatric history significant for PTSD, substance-induced mood disorder, insomnia, and anxiety who presents to [facility] to reestablish psychiatric care and for medication management.  Patient was last seen by Reggie WENDI Rice, MD on 02/24/2023.  During her last encounter, patient was being managed on the following psychiatric medications:   Prozac  60 mg daily Hydroxyzine  50 mg 3 times daily as needed Seroquel  XR 150 mg at bedtime Trazodone  150 mg  at bedtime as needed Wellbutrin  XL 150 mg daily   Patient presents to the encounter requesting refills on her medication regimen.  She reports  that she recently ran out of her hydroxyzine  prescription.  She also notes that she has not been sleeping well.  Patient reports that she receives on average 4 hours of sleep per night.  In addition to experiencing decreased sleep, patient reports that she wakes up throughout the night.   In regards to her mood, patient describes her mood as being pissy.  Patient rates her depression a 5 out of 10 with 10 being most severe.  Patient endorses depressive episodes 3 to 4 days/week.  Patient reports that her use of prozac  used to keep her upbeat but now she finds herself being more crabby and bitchy. Patient endorses the following depressive symptoms: feelings of sadness, lack of motivation, decreased energy, irritability, feelings of guilt/worthlessness, and hopelessness.  Patient denies decreased energy.   In addition to depression, patient endorses anxiety and rates her anxiety a 7 out of 10.  Patient denies any specific triggers to her anxiety.  Patient reports that she has been clean off of drugs for 3-1/2 years and reports that she attends NA meetings regularly.  She reports that she recently moved into a new [place] house after not doing well with the residents at her previous [place] house.  Patient reports that she feels at ease at the new [place] house that she is staying in.  A PHQ-9 screen was performed with the patient scoring a 12.  A GAD-7 screen was also performed with the patient scoring a 19.   Patient is alert and oriented x 4, cooperative, and fully engaged in conversation during the encounter.  Patient endorses good mood.  Patient exhibits depressed mood with appropriate affect.  Patient denies suicidal or homicidal ideations.  She further denies auditory or visual hallucinations and does not appear to be responding to internal/external stimuli.  Patient endorses poor sleep and receives on average 4 hours of sleep per night.  Patient endorses fair appetite and eats on average 2 meals per day.   Patient denies alcohol consumption or illicit drug use.  Patient endorses tobacco use and smokes on hours a pack per day.   Past Psychiatric History:  Bipolar d/o Substance induced mood disorder PTSD Anxiety Was being seen at [facility] in [place] for past 4-5 years   Past Medical History:  Past Medical History:  Diagnosis Date   Abnormal Pap smear of cervix    Allergy    Anxiety    Bipolar disorder (HCC)    Cataract    Complication of anesthesia    Degenerative disc disease at L5-S1 level    Depression    GERD (gastroesophageal reflux disease)    Gout    Hyperlipidemia    Hypertension    Overactive bladder    PONV (postoperative nausea and vomiting)     Past Surgical History:  Procedure Laterality Date   ANTERIOR CERVICAL DECOMP/DISCECTOMY FUSION N/A 03/05/2021   Procedure: ACDF - C5-C6 - C6-C7;  Surgeon: Louis Shove, MD;  Location: [facility] OR;  Service: Neurosurgery;  Laterality: N/A;  3C   CARPAL TUNNEL RELEASE Bilateral    CERVIX LESION DESTRUCTION  1985   CHOLECYSTECTOMY     DILATION AND CURETTAGE OF UTERUS     EYE SURGERY Bilateral    cataract removal   TONSILLECTOMY     TUBAL LIGATION      Family Psychiatric History:  History of depression and anxiety in mother She also informed history of anxiety and depression in her siblings Father had a gambling issue.   Family History:  Family History  Problem Relation Age of Onset   Depression Mother    Anxiety disorder Mother    Asthma Mother    Bipolar disorder Father    Depression Sister    Anxiety disorder Sister    Depression Brother    Anxiety disorder Brother    Depression Brother    Anxiety disorder Brother    Cancer Neg Hx    Diabetes Neg Hx    Heart disease Neg Hx    Colon cancer Neg Hx    Breast cancer Neg Hx    Ovarian cancer Neg Hx    Cervical cancer Neg Hx    Colon polyps Neg Hx     Social History:  Social History   Socioeconomic History   Marital status: Divorced    Spouse  name: Not on file   Number of children: 3   Years of education: Not on file   Highest education level: GED or equivalent  Occupational History   Occupation: Disablilty  Tobacco Use   Smoking status: Every Day    Current packs/day: 1.00    Average packs/day: 1 pack/day for 48.0 years (48.0 ttl pk-yrs)    Types: Cigarettes    Passive exposure: Current   Smokeless tobacco: Never   Tobacco comments:    Pt smokes 1 pack per day 12/30/21 PAP  Vaping Use   Vaping status: Never Used  Substance and Sexual Activity   Alcohol use: Not Currently   Drug use: Not Currently    Comment: 90 days recovery   Sexual activity: Not Currently  Other Topics Concern   Not on file  Social History Narrative   Not on file   Social Drivers of Health   Financial Resource Strain: Low Risk  (07/08/2020)   Overall Financial Resource Strain (CARDIA)    Difficulty of Paying Living Expenses: Not hard at all  Food Insecurity: No Food Insecurity (08/25/2020)   Hunger Vital Sign    Worried About Running Out of Food in the Last Year: Never true    Ran Out of Food in the Last Year: Never true  Transportation Needs: No Transportation Needs (08/25/2020)   PRAPARE - Administrator, Civil Service (Medical): No    Lack of Transportation (Non-Medical): No  Physical Activity: Sufficiently Active (07/08/2020)   Exercise Vital Sign    Days of Exercise per Week: 7 days    Minutes of Exercise per Session: 30 min  Stress: Stress Concern Present (07/08/2020)   [employer] of Occupational Health - Occupational Stress Questionnaire    Feeling of Stress : Rather much  Social Connections: Moderately Isolated (07/08/2020)   Social Connection and Isolation Panel    Frequency of Communication with Friends and Family: Twice a week    Frequency of Social Gatherings with Friends and Family: Never    Attends Religious Services: 1 to 4 times per year    Active Member of [employer] or Organizations: Yes    Attends Museum/gallery Exhibitions Officer: More than 4 times per year    Marital Status: Divorced    Allergies:  Allergies  Allergen Reactions   Codeine Nausea Only   Diphenhydramine Hcl Other (See Comments)    Pt unsure why   Lithium  Itching   Sulfa Antibiotics Nausea And Vomiting   Amoxicillin  Nausea And Vomiting  Current Medications: Current Outpatient Medications  Medication Sig Dispense Refill   acetaminophen  (TYLENOL ) 500 MG tablet Take 1,000 mg by mouth every 6 (six) hours as needed for mild pain.     allopurinol  (ZYLOPRIM ) 100 MG tablet TAKE 1 TABLET BY MOUTH DAILY 100 tablet 1   amLODipine  (NORVASC ) 5 MG tablet Take 1 tablet (5 mg total) by mouth daily. 90 tablet 1   aspirin  EC 81 MG tablet Take 81 mg by mouth every evening.     atorvastatin  (LIPITOR) 40 MG tablet TAKE 1 TABLET BY MOUTH DAILY AT  6 PM 100 tablet 1   buPROPion  (WELLBUTRIN  XL) 150 MG 24 hr tablet Take 1 tablet (150 mg total) by mouth daily. 30 tablet 2   cyclobenzaprine  (FLEXERIL ) 5 MG tablet Take 1 tablet (5 mg total) by mouth at bedtime as needed for muscle spasms. 30 tablet 1   cyclobenzaprine  (FLEXERIL ) 5 MG tablet Take 1 tablet (5 mg total) by mouth 3 (three) times daily as needed for muscle spasms. 30 tablet 0   FLUoxetine  (PROZAC ) 40 MG capsule Take 2 capsules (80 mg total) by mouth daily. 60 capsule 2   HYDROcodone -acetaminophen  (NORCO/VICODIN) 5-325 MG tablet Take 1 tablet by mouth every 4 (four) hours as needed for up to 8 doses. 8 tablet 0   hydrOXYzine  (VISTARIL ) 50 MG capsule TAKE 1 CAPSULE BY MOUTH EVERY 8  HOURS AS NEEDED FOR ANXIETY 90 capsule 2   lisinopril  (ZESTRIL ) 40 MG tablet TAKE 1 TABLET BY MOUTH DAILY 100 tablet 0   meloxicam  (MOBIC ) 7.5 MG tablet TAKE 1 TABLET(7.5 MG) BY MOUTH DAILY 30 tablet 0   methocarbamol  (ROBAXIN ) 500 MG tablet Take 1 tablet (500 mg total) by mouth 2 (two) times daily. 20 tablet 0   pantoprazole  (PROTONIX ) 40 MG tablet TAKE 1 TABLET BY MOUTH TWICE  DAILY 200 tablet 1   QUEtiapine   Fumarate (SEROQUEL  XR) 200 MG 24 hr tablet Take 1 tablet (200 mg total) by mouth at bedtime. 30 tablet 2   traZODone  (DESYREL ) 150 MG tablet Take 1 tablet (150 mg total) by mouth at bedtime as needed for sleep. 30 tablet 2   No current facility-administered medications for this visit.     Musculoskeletal: Strength & Muscle Tone: within normal limits Gait & Station: normal Patient leans: N/A  Psychiatric Specialty Exam: There were no vitals taken for this visit.There is no height or weight on file to calculate BMI. Review of Systems  General Appearance: Casual and Fairly Groomed  Eye Contact:  Good  Speech:  Normal Rate  Volume:  Normal  Mood:  {BHH MOOD:22306}  Affect:  Congruent  Thought Content: Logical   Suicidal Thoughts:  No  Homicidal Thoughts:  No  Thought Process:  Coherent and Linear  Orientation:  Full (Time, Place, and Person)    Memory: Immediate;   Fair Recent;   Fair Remote;   Fair  Judgment:  Fair  Insight:  Fair  Concentration:  Concentration: Good and Attention Span: Good  Recall:  not formally assessed  Fund of Knowledge: Fair  Language: Good  Psychomotor Activity:  Normal  Akathisia:  No  AIMS (if indicated): not done  Assets:  Media Planner  ADL's:  Intact  Cognition: WNL  Sleep:  Fair   Metabolic Disorder Labs: Lab Results  Component Value Date   HGBA1C 5.9 (A) 03/09/2022   No results found for: PROLACTIN Lab Results  Component Value Date   CHOL 172 11/24/2021   TRIG  201.0 (H) 11/24/2021   HDL 39.40 11/24/2021   CHOLHDL 4 11/24/2021   VLDL 40.2 (H) 11/24/2021   LDLCALC 98 04/13/2020   LDLCALC 90 04/01/2019   Lab Results  Component Value Date   TSH 2.610 04/13/2020   TSH 1.720 07/05/2013    Therapeutic Level Labs: Lab Results  Component Value Date   LITHIUM  0.50 (L) 07/10/2013   LITHIUM  <0.25 (L) 07/01/2013   No results found for: VALPROATE Lab Results  Component Value Date    CBMZ 7.6 07/10/2013     Screenings: AIMS    Flowsheet Row Clinical Support from 09/01/2023 in [facility]  AIMS Total Score 1   AUDIT    Flowsheet Row [facility]  Alcohol Use Disorder Identification Test Final Score (AUDIT) 0   GAD-7    Flowsheet Row Clinical Support from 09/01/2023 in [facility] Office Visit from 08/22/2023 in [facility] [place] Counselor from 03/29/2023 in [facility] Office Visit from 03/02/2023 in [facility] [place] Office Visit from 03/09/2022 in [facility] [place]  Total GAD-7 Score 19 3 12  0 0   PHQ2-9    Flowsheet Row Clinical Support from 09/01/2023 in [facility] Office Visit from 08/22/2023 in [facility] [place] [facility] [place] Counselor from 03/29/2023 in [facility] Office Visit from 03/02/2023 in [facility] [place] [facility] [place] Office Visit from 06/27/2022 in [facility] [place]  PHQ-2 Total Score 2 0 4 0 2  PHQ-9 Total Score 12 4 6 6 4    Flowsheet Row Clinical Support from 09/01/2023 in [facility] ED from 08/18/2023 in [facility] Emergency Department at [facility] ED from 07/16/2023 in [facility] Emergency Department at [facility]  C-SSRS RISK CATEGORY Moderate Risk No Risk No Risk    Collaboration of Care: Collaboration of Care: Other ***  Patient/Guardian was advised Release of Information must be obtained prior to any record release in order to collaborate their care with an outside provider. Patient/Guardian was advised if they have not already done so to contact the registration department to sign all necessary forms in order for us  to release information regarding their care.    Consent: Patient/Guardian gives verbal consent for treatment and assignment of benefits for services provided during this visit. Patient/Guardian expressed understanding and agreed to proceed.    Blandina Renaldo, MD 10/05/2023, 2:51 PM

## 2023-10-18 DIAGNOSIS — H43812 Vitreous degeneration, left eye: Secondary | ICD-10-CM | POA: Diagnosis not present

## 2023-10-18 DIAGNOSIS — H0288B Meibomian gland dysfunction left eye, upper and lower eyelids: Secondary | ICD-10-CM | POA: Diagnosis not present

## 2023-10-18 DIAGNOSIS — H04123 Dry eye syndrome of bilateral lacrimal glands: Secondary | ICD-10-CM | POA: Diagnosis not present

## 2023-10-18 DIAGNOSIS — Z961 Presence of intraocular lens: Secondary | ICD-10-CM | POA: Diagnosis not present

## 2023-10-18 DIAGNOSIS — H0288A Meibomian gland dysfunction right eye, upper and lower eyelids: Secondary | ICD-10-CM | POA: Diagnosis not present

## 2023-10-19 ENCOUNTER — Encounter (HOSPITAL_COMMUNITY): Payer: Self-pay

## 2023-10-19 ENCOUNTER — Encounter (HOSPITAL_COMMUNITY)

## 2023-10-30 ENCOUNTER — Ambulatory Visit (INDEPENDENT_AMBULATORY_CARE_PROVIDER_SITE_OTHER): Admitting: Internal Medicine

## 2023-10-30 ENCOUNTER — Encounter: Payer: Self-pay | Admitting: Internal Medicine

## 2023-10-30 VITALS — BP 120/80 | HR 85 | Temp 97.8°F | Wt 189.9 lb

## 2023-10-30 DIAGNOSIS — I10 Essential (primary) hypertension: Secondary | ICD-10-CM | POA: Diagnosis not present

## 2023-10-30 DIAGNOSIS — Z23 Encounter for immunization: Secondary | ICD-10-CM | POA: Diagnosis not present

## 2023-10-30 DIAGNOSIS — Z720 Tobacco use: Secondary | ICD-10-CM

## 2023-10-30 NOTE — Progress Notes (Signed)
 Established Patient Office Visit     CC/Reason for Visit: Blood pressure follow-up  HPI: Marisa Sherman is a 63 y.o. female who is coming in today for the above mentioned reasons.  6 weeks ago we added amlodipine  to lisinopril  due to suboptimal blood pressure control.  At that visit we also started her on Wellbutrin  for smoking cessation.  She has not smoked a cigarette since that day.  Her ambulatory blood pressure measurements show great control.  Requesting flu vaccine.   Past Medical/Surgical History: Past Medical History:  Diagnosis Date   Abnormal Pap smear of cervix    Allergy    Anxiety    Bipolar disorder (HCC)    Cataract    Complication of anesthesia    Degenerative disc disease at L5-S1 level    Depression    GERD (gastroesophageal reflux disease)    Gout    Hyperlipidemia    Hypertension    Overactive bladder    PONV (postoperative nausea and vomiting)     Past Surgical History:  Procedure Laterality Date   ANTERIOR CERVICAL DECOMP/DISCECTOMY FUSION N/A 03/05/2021   Procedure: ACDF - C5-C6 - C6-C7;  Surgeon: Louis Shove, MD;  Location: [facility];  Service: Neurosurgery;  Laterality: N/A;  3C   CARPAL TUNNEL RELEASE Bilateral    CERVIX LESION DESTRUCTION  1985   CHOLECYSTECTOMY     DILATION AND CURETTAGE OF UTERUS     EYE SURGERY Bilateral    cataract removal   TONSILLECTOMY     TUBAL LIGATION      Social History:  reports that she has quit smoking. Her smoking use included cigarettes. She has a 48 pack-year smoking history. She has been exposed to tobacco smoke. She has never used smokeless tobacco. She reports that she does not currently use alcohol. She reports that she does not currently use drugs.  Allergies: Allergies  Allergen Reactions   Codeine Nausea Only   Diphenhydramine Hcl Other (See Comments)    Pt unsure why   Lithium  Itching   Sulfa Antibiotics Nausea And Vomiting   Amoxicillin  Nausea And Vomiting    Family History:  Family  History  Problem Relation Age of Onset   Depression Mother    Anxiety disorder Mother    Asthma Mother    Bipolar disorder Father    Depression Sister    Anxiety disorder Sister    Depression Brother    Anxiety disorder Brother    Depression Brother    Anxiety disorder Brother    Cancer Neg Hx    Diabetes Neg Hx    Heart disease Neg Hx    Colon cancer Neg Hx    Breast cancer Neg Hx    Ovarian cancer Neg Hx    Cervical cancer Neg Hx    Colon polyps Neg Hx      Current Outpatient Medications:    acetaminophen  (TYLENOL ) 500 MG tablet, Take 1,000 mg by mouth every 6 (six) hours as needed for mild pain., Disp: , Rfl:    allopurinol  (ZYLOPRIM ) 100 MG tablet, TAKE 1 TABLET BY MOUTH DAILY, Disp: 100 tablet, Rfl: 1   amLODipine  (NORVASC ) 5 MG tablet, Take 1 tablet (5 mg total) by mouth daily., Disp: 90 tablet, Rfl: 1   aspirin  EC 81 MG tablet, Take 81 mg by mouth every evening., Disp: , Rfl:    atorvastatin  (LIPITOR) 40 MG tablet, TAKE 1 TABLET BY MOUTH DAILY AT  6 PM, Disp: 100 tablet, Rfl: 1  buPROPion  (WELLBUTRIN  XL) 150 MG 24 hr tablet, Take 1 tablet (150 mg total) by mouth daily., Disp: 30 tablet, Rfl: 2   cyclobenzaprine  (FLEXERIL ) 5 MG tablet, Take 1 tablet (5 mg total) by mouth at bedtime as needed for muscle spasms., Disp: 30 tablet, Rfl: 1   cyclobenzaprine  (FLEXERIL ) 5 MG tablet, Take 1 tablet (5 mg total) by mouth 3 (three) times daily as needed for muscle spasms., Disp: 30 tablet, Rfl: 0   FLUoxetine  (PROZAC ) 40 MG capsule, Take 2 capsules (80 mg total) by mouth daily., Disp: 60 capsule, Rfl: 2   HYDROcodone -acetaminophen  (NORCO/VICODIN) 5-325 MG tablet, Take 1 tablet by mouth every 4 (four) hours as needed for up to 8 doses., Disp: 8 tablet, Rfl: 0   hydrOXYzine  (VISTARIL ) 50 MG capsule, TAKE 1 CAPSULE BY MOUTH EVERY 8  HOURS AS NEEDED FOR ANXIETY, Disp: 90 capsule, Rfl: 2   lisinopril  (ZESTRIL ) 40 MG tablet, TAKE 1 TABLET BY MOUTH DAILY, Disp: 100 tablet, Rfl: 0   meloxicam   (MOBIC ) 7.5 MG tablet, TAKE 1 TABLET(7.5 MG) BY MOUTH DAILY, Disp: 30 tablet, Rfl: 0   methocarbamol  (ROBAXIN ) 500 MG tablet, Take 1 tablet (500 mg total) by mouth 2 (two) times daily., Disp: 20 tablet, Rfl: 0   pantoprazole  (PROTONIX ) 40 MG tablet, TAKE 1 TABLET BY MOUTH TWICE  DAILY, Disp: 200 tablet, Rfl: 1   QUEtiapine  Fumarate (SEROQUEL  XR) 200 MG 24 hr tablet, Take 1 tablet (200 mg total) by mouth at bedtime., Disp: 30 tablet, Rfl: 2   traZODone  (DESYREL ) 150 MG tablet, Take 1 tablet (150 mg total) by mouth at bedtime as needed for sleep., Disp: 30 tablet, Rfl: 2  Review of Systems:  Negative unless indicated in HPI.   Physical Exam: Vitals:   10/30/23 1521  BP: 120/80  Pulse: 85  Temp: 97.8 F (36.6 C)  TempSrc: Oral  SpO2: 98%  Weight: 189 lb 14.4 oz (86.1 kg)    Body mass index is 30.65 kg/m.   Physical Exam Vitals reviewed.  Constitutional:      Appearance: Normal appearance.  HENT:     Head: Normocephalic and atraumatic.  Eyes:     Conjunctiva/sclera: Conjunctivae normal.  Cardiovascular:     Rate and Rhythm: Normal rate and regular rhythm.  Pulmonary:     Effort: Pulmonary effort is normal.     Breath sounds: Normal breath sounds.  Skin:    General: Skin is warm and dry.  Neurological:     General: No focal deficit present.     Mental Status: She is alert and oriented to person, place, and time.  Psychiatric:        Mood and Affect: Mood normal.        Behavior: Behavior normal.        Thought Content: Thought content normal.        Judgment: Judgment normal.      Impression and Plan:  Tobacco abuse  Essential hypertension  Immunization due  -Flu vaccine given in office today. - Blood pressure is well-controlled on amlodipine  5 mg and lisinopril  40 mg daily. - Congratulated on smoking cessation success.   Time spent:31 minutes reviewing chart, interviewing and examining patient and formulating plan of care.     Tully Theophilus Andrews,  MD [facility] at [facility]

## 2023-10-30 NOTE — Addendum Note (Signed)
 Addended by: KATHRYNE MILLMAN B on: 10/30/2023 04:02 PM   Modules accepted: Orders

## 2023-11-02 DIAGNOSIS — H0288B Meibomian gland dysfunction left eye, upper and lower eyelids: Secondary | ICD-10-CM | POA: Diagnosis not present

## 2023-11-02 DIAGNOSIS — Z961 Presence of intraocular lens: Secondary | ICD-10-CM | POA: Diagnosis not present

## 2023-11-02 DIAGNOSIS — H0288A Meibomian gland dysfunction right eye, upper and lower eyelids: Secondary | ICD-10-CM | POA: Diagnosis not present

## 2023-11-02 DIAGNOSIS — H04123 Dry eye syndrome of bilateral lacrimal glands: Secondary | ICD-10-CM | POA: Diagnosis not present

## 2023-11-03 NOTE — Progress Notes (Deleted)
 [facility] MD/PA/NP OP Progress Note  11/03/2023 10:04 AM Mahrosh Donnell  MRN:  [id number]  Visit Diagnosis: No diagnosis found.  Assessment:  Marisa Sherman is a 63 year old female with a past psychiatric history significant for PTSD, substance-induced mood disorder, insomnia, and anxiety who presents to [facility] to reestablish psychiatric care and for medication management.  Due to her use of Seroquel , an aims assessment was performed with the patient scoring a 1.  Patient reports that she occasionally experiences jerking movements in her hands, especially when holding things.  Patient endorses minimal distress over these movements and denies the need for medication management at this time.   Patient presents to the encounter requesting refills on her current medication regimen.  She reports that she is currently out of her hydroxyzine  prescription.  Patient notes that she has been having issues with sleep and may receive roughly 4 hours of intermittent sleep per night.  Patient reports that her use of Seroquel  has been effective in providing her with sleep.  Provider recommended increasing her Seroquel  dosage from 150 mg to 200 mg at bedtime for the management of her sleep and for mood stability.  Patient was agreeable to recommendation.   Patient reports that she still continues to experience depression characterized by irritable mood.  She also endorses worsening anxiety with no specific triggers.  A PHQ-9 screen was performed with the patient scoring a 12.  A GAD-7 screen was also performed with the patient scoring a 19.  In the past, patient reports that her use of Prozac  was helpful in managing her mood.  Provider recommended increasing her Prozac  dosage from 60 mg to 40 mg 2 times daily for the management of her depressive symptoms and anxiety.  Patient was agreeable to recommendations.  Patient to continue taking all other medications as prescribed.  Patient's  medications to be e-prescribed to pharmacy of choice.  Risk Assessment: An assessment of suicide and violence risk factors was performed as part of this evaluation and is not significantly changed from the last visit. While future psychiatric events cannot be accurately predicted, the patient does not currently require acute inpatient psychiatric care and does not currently meet [place]  involuntary commitment criteria. Patient was given contact information for crisis resources, behavioral health clinic and was instructed to call 911 for emergencies.   Shadawn Hanaway presents for follow-up evaluation. 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  HPI:  Marisa Sherman is a 63 year old female with a past psychiatric history significant for PTSD, substance-induced mood disorder, insomnia, and anxiety who presents to [facility] to reestablish psychiatric care and for medication management.  Patient was last seen by Reggie WENDI Rice, MD on 02/24/2023.  During her last encounter, patient was being managed on the following psychiatric medications:   Prozac  60 mg daily Hydroxyzine  50 mg 3 times daily as needed Seroquel  XR 150 mg at bedtime Trazodone  150 mg  at bedtime as needed Wellbutrin  XL 150 mg daily   Patient presents to the encounter requesting refills on her medication regimen.  She reports  that she recently ran out of her hydroxyzine  prescription.  She also notes that she has not been sleeping well.  Patient reports that she receives on average 4 hours of sleep per night.  In addition to experiencing decreased sleep, patient reports that she wakes up throughout the night.   In regards to her mood, patient describes her mood as being pissy.  Patient rates her depression a 5 out of 10 with 10 being most severe.  Patient endorses depressive episodes 3 to 4 days/week.  Patient reports that her use of prozac  used to keep her upbeat but now she finds herself being more crabby and bitchy. Patient endorses the following depressive symptoms: feelings of sadness, lack of motivation, decreased energy, irritability, feelings of guilt/worthlessness, and hopelessness.  Patient denies decreased energy.   In addition to depression, patient endorses anxiety and rates her anxiety a 7 out of 10.  Patient denies any specific triggers to her anxiety.  Patient reports that she has been clean off of drugs for 3-1/2 years and reports that she attends NA meetings regularly.  She reports that she recently moved into a new [place] after not doing well with the residents at her previous [place] house.  Patient reports that she feels at ease at the new [place] house that she is staying in.  A PHQ-9 screen was performed with the patient scoring a 12.  A GAD-7 screen was also performed with the patient scoring a 19.   Patient is alert and oriented x 4, cooperative, and fully engaged in conversation during the encounter.  Patient endorses good mood.  Patient exhibits depressed mood with appropriate affect.  Patient denies suicidal or homicidal ideations.  She further denies auditory or visual hallucinations and does not appear to be responding to internal/external stimuli.  Patient endorses poor sleep and receives on average 4 hours of sleep per night.  Patient endorses fair appetite and eats on average 2 meals per day.   Patient denies alcohol consumption or illicit drug use.  Patient endorses tobacco use and smokes on hours a pack per day.   Past Psychiatric History:  Bipolar d/o Substance induced mood disorder PTSD Anxiety Was being seen at Psychiatry clinic [facility] in [place] for past 4-5 years   Past Medical History:  Past Medical History:  Diagnosis Date   Abnormal Pap smear of cervix    Allergy    Anxiety    Bipolar disorder (HCC)    Cataract    Complication of anesthesia    Degenerative disc disease at L5-S1 level    Depression    GERD (gastroesophageal reflux disease)    Gout    Hyperlipidemia    Hypertension    Overactive bladder    PONV (postoperative nausea and vomiting)     Past Surgical History:  Procedure Laterality Date   ANTERIOR CERVICAL DECOMP/DISCECTOMY FUSION N/A 03/05/2021   Procedure: ACDF - C5-C6 - C6-C7;  Surgeon: Louis Shove, MD;  Location: [facility] OR;  Service: Neurosurgery;  Laterality: N/A;  3C   CARPAL TUNNEL RELEASE Bilateral    CERVIX LESION DESTRUCTION  1985   CHOLECYSTECTOMY     DILATION AND CURETTAGE OF UTERUS     EYE SURGERY Bilateral    cataract removal   TONSILLECTOMY     TUBAL LIGATION      Family Psychiatric History:  History of depression and anxiety in mother She also informed history of anxiety and depression in her siblings Father had a gambling issue.   Family History:  Family History  Problem Relation Age of Onset   Depression Mother    Anxiety disorder Mother    Asthma Mother    Bipolar disorder Father    Depression Sister    Anxiety disorder Sister    Depression Brother    Anxiety disorder Brother    Depression Brother    Anxiety disorder Brother    Cancer Neg Hx    Diabetes Neg Hx    Heart disease Neg Hx    Colon cancer Neg Hx    Breast cancer Neg Hx    Ovarian cancer Neg Hx    Cervical cancer Neg Hx    Colon polyps Neg Hx     Social History:  Social History   Socioeconomic History   Marital status: Divorced    Spouse  name: Not on file   Number of children: 3   Years of education: Not on file   Highest education level: GED or equivalent  Occupational History   Occupation: Disablilty  Tobacco Use   Smoking status: Former    Current packs/day: 1.00    Average packs/day: 1 pack/day for 48.0 years (48.0 ttl pk-yrs)    Types: Cigarettes    Passive exposure: Current   Smokeless tobacco: Never   Tobacco comments:    Pt smokes 1 pack per day 12/30/21 PAP  Vaping Use   Vaping status: Never Used  Substance and Sexual Activity   Alcohol use: Not Currently   Drug use: Not Currently    Comment: 90 days recovery   Sexual activity: Not Currently  Other Topics Concern   Not on file  Social History Narrative   Not on file   Social Drivers of Health   Financial Resource Strain: Low Risk  (07/08/2020)   Overall Financial Resource Strain (CARDIA)    Difficulty of Paying Living Expenses: Not hard at all  Food Insecurity: No Food Insecurity (08/25/2020)   Hunger Vital Sign    Worried About Running Out of Food in the Last Year: Never true    Ran Out of Food in the Last Year: Never true  Transportation Needs: No Transportation Needs (08/25/2020)   PRAPARE - Administrator, Civil Service (Medical): No    Lack of Transportation (Non-Medical): No  Physical Activity: Sufficiently Active (07/08/2020)   Exercise Vital Sign    Days of Exercise per Week: 7 days    Minutes of Exercise per Session: 30 min  Stress: Stress Concern Present (07/08/2020)   [employer] of Occupational Health - Occupational Stress Questionnaire    Feeling of Stress : Rather much  Social Connections: Moderately Isolated (07/08/2020)   Social Connection and Isolation Panel    Frequency of Communication with Friends and Family: Twice a week    Frequency of Social Gatherings with Friends and Family: Never    Attends Religious Services: 1 to 4 times per year    Active Member of [employer] or Organizations: Yes    Attends Museum/gallery exhibitions officer: More than 4 times per year    Marital Status: Divorced    Allergies:  Allergies  Allergen Reactions   Codeine Nausea Only   Diphenhydramine Hcl Other (See Comments)    Pt unsure why   Lithium  Itching   Sulfa Antibiotics Nausea And Vomiting   Amoxicillin  Nausea And Vomiting  Current Medications: Current Outpatient Medications  Medication Sig Dispense Refill   acetaminophen  (TYLENOL ) 500 MG tablet Take 1,000 mg by mouth every 6 (six) hours as needed for mild pain.     allopurinol  (ZYLOPRIM ) 100 MG tablet TAKE 1 TABLET BY MOUTH DAILY 100 tablet 1   amLODipine  (NORVASC ) 5 MG tablet Take 1 tablet (5 mg total) by mouth daily. 90 tablet 1   aspirin  EC 81 MG tablet Take 81 mg by mouth every evening.     atorvastatin  (LIPITOR) 40 MG tablet TAKE 1 TABLET BY MOUTH DAILY AT  6 PM 100 tablet 1   buPROPion  (WELLBUTRIN  XL) 150 MG 24 hr tablet Take 1 tablet (150 mg total) by mouth daily. 30 tablet 2   cyclobenzaprine  (FLEXERIL ) 5 MG tablet Take 1 tablet (5 mg total) by mouth at bedtime as needed for muscle spasms. 30 tablet 1   cyclobenzaprine  (FLEXERIL ) 5 MG tablet Take 1 tablet (5 mg total) by mouth 3 (three) times daily as needed for muscle spasms. 30 tablet 0   FLUoxetine  (PROZAC ) 40 MG capsule Take 2 capsules (80 mg total) by mouth daily. 60 capsule 2   HYDROcodone -acetaminophen  (NORCO/VICODIN) 5-325 MG tablet Take 1 tablet by mouth every 4 (four) hours as needed for up to 8 doses. 8 tablet 0   hydrOXYzine  (VISTARIL ) 50 MG capsule TAKE 1 CAPSULE BY MOUTH EVERY 8  HOURS AS NEEDED FOR ANXIETY 90 capsule 2   lisinopril  (ZESTRIL ) 40 MG tablet TAKE 1 TABLET BY MOUTH DAILY 100 tablet 0   meloxicam  (MOBIC ) 7.5 MG tablet TAKE 1 TABLET(7.5 MG) BY MOUTH DAILY 30 tablet 0   methocarbamol  (ROBAXIN ) 500 MG tablet Take 1 tablet (500 mg total) by mouth 2 (two) times daily. 20 tablet 0   pantoprazole  (PROTONIX ) 40 MG tablet TAKE 1 TABLET BY MOUTH TWICE  DAILY 200 tablet 1   QUEtiapine   Fumarate (SEROQUEL  XR) 200 MG 24 hr tablet Take 1 tablet (200 mg total) by mouth at bedtime. 30 tablet 2   traZODone  (DESYREL ) 150 MG tablet Take 1 tablet (150 mg total) by mouth at bedtime as needed for sleep. 30 tablet 2   No current facility-administered medications for this visit.     Musculoskeletal: Strength & Muscle Tone: within normal limits Gait & Station: normal Patient leans: N/A  Psychiatric Specialty Exam: There were no vitals taken for this visit.There is no height or weight on file to calculate BMI. Review of Systems  General Appearance: Casual and Fairly Groomed  Eye Contact:  Good  Speech:  Normal Rate  Volume:  Normal  Mood:  {BHH MOOD:22306}  Affect:  Congruent  Thought Content: Logical   Suicidal Thoughts:  No  Homicidal Thoughts:  No  Thought Process:  Coherent and Linear  Orientation:  Full (Time, Place, and Person)    Memory: Immediate;   Fair Recent;   Fair Remote;   Fair  Judgment:  Fair  Insight:  Fair  Concentration:  Concentration: Good and Attention Span: Good  Recall:  not formally assessed  Fund of Knowledge: Fair  Language: Good  Psychomotor Activity:  Normal  Akathisia:  No  AIMS (if indicated): not done  Assets:  Media planner  ADL's:  Intact  Cognition: WNL  Sleep:  Fair   Metabolic Disorder Labs: Lab Results  Component Value Date   HGBA1C 5.9 (A) 03/09/2022   No results found for: PROLACTIN Lab Results  Component Value Date   CHOL 172 11/24/2021   TRIG  201.0 (H) 11/24/2021   HDL 39.40 11/24/2021   CHOLHDL 4 11/24/2021   VLDL 40.2 (H) 11/24/2021   LDLCALC 98 04/13/2020   LDLCALC 90 04/01/2019   Lab Results  Component Value Date   TSH 2.610 04/13/2020   TSH 1.720 07/05/2013    Therapeutic Level Labs: Lab Results  Component Value Date   LITHIUM  0.50 (L) 07/10/2013   LITHIUM  <0.25 (L) 07/01/2013   No results found for: VALPROATE Lab Results  Component Value Date    CBMZ 7.6 07/10/2013     Screenings: AIMS    Flowsheet Row Clinical Support from 09/01/2023 in [facility]  AIMS Total Score 1   AUDIT    Flowsheet Row [facility]  Alcohol Use Disorder Identification Test Final Score (AUDIT) 0   GAD-7    Flowsheet Row Clinical Support from 09/01/2023 in [facility] Office Visit from 08/22/2023 in [facility] [place] [facility] from 03/29/2023 in [facility] Office Visit from 03/02/2023 in [facility] [place] Office Visit from 03/09/2022 in [facility] [place]  Total GAD-7 Score 19 3 12  0 0   PHQ2-9    Flowsheet Row Clinical Support from 09/01/2023 in [facility] Office Visit from 08/22/2023 in [facility] [place] [facility] from 03/29/2023 in [facility] Office Visit from 03/02/2023 in [facility] [place] Office Visit from 06/27/2022 in [facility] [place]  PHQ-2 Total Score 2 0 4 0 2  PHQ-9 Total Score 12 4 6 6 4    Flowsheet Row Clinical Support from 09/01/2023 in [facility] ED from 08/18/2023 in [facility] Department at [facility] ED from 07/16/2023 in [facility] Department at [facility]  C-SSRS RISK CATEGORY Moderate Risk No Risk No Risk    Collaboration of Care: Collaboration of Care: Other ***  Patient/Guardian was advised Release of Information must be obtained prior to any record release in order to collaborate their care with an outside provider. Patient/Guardian was advised if they have not already done so to contact the registration department to sign all necessary forms in order for us  to release information regarding their care.    Consent: Patient/Guardian gives verbal consent for treatment and assignment of benefits for services provided during this visit. Patient/Guardian expressed understanding and agreed to proceed.    Danyele Smejkal, MD 11/03/2023, 10:04 AM

## 2023-11-06 ENCOUNTER — Other Ambulatory Visit: Payer: Self-pay | Admitting: Internal Medicine

## 2023-11-06 DIAGNOSIS — H04123 Dry eye syndrome of bilateral lacrimal glands: Secondary | ICD-10-CM | POA: Diagnosis not present

## 2023-11-06 DIAGNOSIS — H0288A Meibomian gland dysfunction right eye, upper and lower eyelids: Secondary | ICD-10-CM | POA: Diagnosis not present

## 2023-11-06 DIAGNOSIS — M545 Low back pain, unspecified: Secondary | ICD-10-CM

## 2023-11-06 DIAGNOSIS — H0288B Meibomian gland dysfunction left eye, upper and lower eyelids: Secondary | ICD-10-CM | POA: Diagnosis not present

## 2023-11-06 DIAGNOSIS — Z961 Presence of intraocular lens: Secondary | ICD-10-CM | POA: Diagnosis not present

## 2023-11-06 DIAGNOSIS — M542 Cervicalgia: Secondary | ICD-10-CM

## 2023-11-06 NOTE — Telephone Encounter (Unsigned)
 Copied from CRM [id number]. Topic: Clinical - Medication Refill >> Nov 06, 2023 11:06 AM [place] E wrote: Medication: cyclobenzaprine  (FLEXERIL ) 5 MG tablet   Has the patient contacted their pharmacy? No (Agent: If no, request that the patient contact the pharmacy for the refill. If patient does not wish to contact the pharmacy document the reason why and proceed with request.) (Agent: If yes, when and what did the pharmacy advise?)  This is the patient's preferred pharmacy:   [id number] [place], [place] - [address] AT [facility] [address] [facility] [address] [facility] [address] [place] [address] Phone: [phone] Fax: [phone]  Is this the correct pharmacy for this prescription? Yes If no, delete pharmacy and type the correct one.   Has the prescription been filled recently? No  Is the patient out of the medication? No, one day left.  Has the patient been seen for an appointment in the last year OR does the patient have an upcoming appointment? Yes  Can we respond through MyChart? Yes  Agent: Please be advised that Rx refills may take up to 3 business days. We ask that you follow-up with your pharmacy.

## 2023-11-07 MED ORDER — CYCLOBENZAPRINE HCL 5 MG PO TABS: 5.0000 mg | ORAL_TABLET | Freq: Three times a day (TID) | ORAL | 0 refills | Status: DC | PRN

## 2023-11-14 NOTE — Progress Notes (Signed)
 Flavia Bruss                                          MRN: [id number]   11/14/2023   The [facility]BCI Quality Team Specialist reviewed this patient medical record for the purposes of chart review for care gap closure. The following were reviewed: chart review for care gap closure-colorectal cancer screening.    VBCI Quality Team

## 2023-11-16 ENCOUNTER — Encounter (HOSPITAL_COMMUNITY)

## 2023-11-16 ENCOUNTER — Telehealth (HOSPITAL_COMMUNITY): Payer: Self-pay

## 2023-11-16 NOTE — Telephone Encounter (Signed)
 Patient had an appointment today at 10 AM.  Call the patient at 10:05 AM, there was no answer, left a HIPAA compliant voicemail for the patient to call the clinic.  Her current appointment was marked as a no-show.  Meesha Sek

## 2023-11-20 ENCOUNTER — Other Ambulatory Visit: Payer: Self-pay | Admitting: Internal Medicine

## 2023-11-20 DIAGNOSIS — M5412 Radiculopathy, cervical region: Secondary | ICD-10-CM

## 2023-11-20 NOTE — Telephone Encounter (Unsigned)
 Copied from CRM [id number]. Topic: Clinical - Medication Refill >> Nov 20, 2023  3:11 PM Taleah C wrote: Medication: cyclobenzaprine   Has the patient contacted their pharmacy? Yes No more refills  This is the patient's preferred pharmacy:   [id number] [place], [place] - [address] AT [facility] OF [facility] [address] & [facility] [address] [place] [address] Phone: [phone] Fax: [phone]  Is this the correct pharmacy for this prescription? Yes If no, delete pharmacy and type the correct one.   Has the prescription been filled recently? Yes  Is the patient out of the medication? Yes  Has the patient been seen for an appointment in the last year OR does the patient have an upcoming appointment? Yes  Can we respond through MyChart? Yes  Agent: Please be advised that Rx refills may take up to 3 business days. We ask that you follow-up with your pharmacy.

## 2023-11-21 MED ORDER — CYCLOBENZAPRINE HCL 5 MG PO TABS: 5.0000 mg | ORAL_TABLET | Freq: Every evening | ORAL | 1 refills | Status: DC | PRN

## 2023-11-23 ENCOUNTER — Telehealth: Payer: Self-pay | Admitting: *Deleted

## 2023-11-23 ENCOUNTER — Other Ambulatory Visit: Payer: Self-pay | Admitting: Internal Medicine

## 2023-11-23 DIAGNOSIS — M542 Cervicalgia: Secondary | ICD-10-CM

## 2023-11-23 DIAGNOSIS — M545 Low back pain, unspecified: Secondary | ICD-10-CM

## 2023-11-23 NOTE — Telephone Encounter (Signed)
 Copied from CRM #[id number]. Topic: Clinical - Medication Question >> Nov 23, 2023  4:30 PM Sophia H wrote: Reason for CRM: Patient is calling in regarding cyclobenzaprine  (FLEXERIL ) 5 MG tablet, Take 1 tablet (5 mg total) by mouth 3 (three) times daily as needed for muscle spasms. Patient states pharmacy told her they faxed a request over with no response. Advised patient that further refills needed to come from Dr. Malcolm based on note to pharmacy and patient states PCP has been prescribing these for a couple of months now. She does not see Dr. Malcolm any more. Patient would like some clarification from clinic # [phone]

## 2023-11-29 ENCOUNTER — Encounter: Payer: Self-pay | Admitting: Internal Medicine

## 2023-11-29 ENCOUNTER — Encounter: Admitting: Internal Medicine

## 2023-11-29 DIAGNOSIS — M542 Cervicalgia: Secondary | ICD-10-CM

## 2023-11-29 DIAGNOSIS — M545 Low back pain, unspecified: Secondary | ICD-10-CM

## 2023-11-29 MED ORDER — CYCLOBENZAPRINE HCL 5 MG PO TABS: 5.0000 mg | ORAL_TABLET | Freq: Three times a day (TID) | ORAL | 0 refills | Status: DC | PRN

## 2023-11-29 NOTE — Progress Notes (Signed)
 This encounter was created in error - please disregard.

## 2023-12-05 ENCOUNTER — Other Ambulatory Visit (HOSPITAL_COMMUNITY): Payer: Self-pay | Admitting: Physician Assistant

## 2023-12-05 DIAGNOSIS — F17210 Nicotine dependence, cigarettes, uncomplicated: Secondary | ICD-10-CM

## 2023-12-10 ENCOUNTER — Other Ambulatory Visit: Payer: Self-pay | Admitting: Internal Medicine

## 2023-12-10 DIAGNOSIS — I10 Essential (primary) hypertension: Secondary | ICD-10-CM

## 2023-12-20 ENCOUNTER — Other Ambulatory Visit (HOSPITAL_COMMUNITY): Payer: Self-pay | Admitting: Physician Assistant

## 2023-12-20 DIAGNOSIS — F419 Anxiety disorder, unspecified: Secondary | ICD-10-CM

## 2023-12-25 ENCOUNTER — Encounter (HOSPITAL_COMMUNITY): Payer: Self-pay | Admitting: Psychiatry

## 2023-12-25 ENCOUNTER — Ambulatory Visit (INDEPENDENT_AMBULATORY_CARE_PROVIDER_SITE_OTHER): Admitting: Psychiatry

## 2023-12-25 DIAGNOSIS — F1994 Other psychoactive substance use, unspecified with psychoactive substance-induced mood disorder: Secondary | ICD-10-CM | POA: Diagnosis not present

## 2023-12-25 DIAGNOSIS — F4310 Post-traumatic stress disorder, unspecified: Secondary | ICD-10-CM

## 2023-12-25 DIAGNOSIS — F17210 Nicotine dependence, cigarettes, uncomplicated: Secondary | ICD-10-CM

## 2023-12-25 DIAGNOSIS — F419 Anxiety disorder, unspecified: Secondary | ICD-10-CM

## 2023-12-25 DIAGNOSIS — G4700 Insomnia, unspecified: Secondary | ICD-10-CM

## 2023-12-25 MED ORDER — QUETIAPINE FUMARATE ER 200 MG PO TB24: 200.0000 mg | ORAL_TABLET | Freq: Every day | ORAL | 3 refills | Status: DC

## 2023-12-25 MED ORDER — BUPROPION HCL ER (XL) 150 MG PO TB24: 150.0000 mg | ORAL_TABLET | Freq: Every day | ORAL | 3 refills | Status: AC

## 2023-12-25 MED ORDER — TRAZODONE HCL 150 MG PO TABS: 150.0000 mg | ORAL_TABLET | Freq: Every evening | ORAL | 3 refills | Status: DC | PRN

## 2023-12-25 MED ORDER — HYDROXYZINE PAMOATE 50 MG PO CAPS: ORAL_CAPSULE | ORAL | 3 refills | Status: AC

## 2023-12-25 MED ORDER — FLUOXETINE HCL 40 MG PO CAPS: 80.0000 mg | ORAL_CAPSULE | Freq: Every day | ORAL | 3 refills | Status: DC

## 2023-12-25 NOTE — Progress Notes (Signed)
 [facility] MD/PA/NP OP Progress Note  12/25/2023 8:51 AM Marisa Sherman  MRN:  [id number]  Chief Complaint: I need refills  HPI: 63 year old female seen today for follow-up psychiatric evaluation.  Patient has not been seen in the clinic since August and walked in today for medication refills.  She has a psychiatric history of PTSD, substance use (in remission four years), tobacco dependence (in remission), bipolar disorder, and anxiety.  Currently she is managed on Prozac  80 mg daily, hydroxyzine  50 mg every 8 hours as needed, trazodone  150 mg nightly as needed, Seroquel  XR 200 mg nightly, and Wellbutrin  XL 150 mg daily.  She notes that her medications are effective in managing her psychiatric condition.  Today she is well-groomed, pleasant, cooperative, and engaged in conversation.  Patient informed clinical research associate that she is in need of refills.  She notes that she ran out of hydroxyzine .  Patient does note that overall her mood is stable and denies symptoms of anxiety and depression.  Today provider conducted a GAD-7 and patient scored a 0.  Provider also conducted PHQ-9 of a score of 0.  She endorsed adequate sleep and appetite.  Today she denies SI/HI/AVH, mania, paranoia.  Patient informed clinical research associate that she will be starting a new job at [employer] every couple of days.  She notes that she is excited about this.  Today no medication changes made.  Patient agreeable to continue medication as prescribed.  No other concerns at this time. Visit Diagnosis:    ICD-10-CM   1. Cigarette nicotine  dependence without complication  F17.210 buPROPion  (WELLBUTRIN  XL) 150 MG 24 hr tablet    2. Anxiety  F41.9 QUEtiapine  (SEROQUEL  XR) 200 MG 24 hr tablet    FLUoxetine  (PROZAC ) 40 MG capsule    hydrOXYzine  (VISTARIL ) 50 MG capsule    3. Substance induced mood disorder (HCC)  F19.94 QUEtiapine  (SEROQUEL  XR) 200 MG 24 hr tablet    FLUoxetine  (PROZAC ) 40 MG capsule    4. PTSD (post-traumatic stress disorder)  F43.10  FLUoxetine  (PROZAC ) 40 MG capsule    traZODone  (DESYREL ) 150 MG tablet    5. Insomnia, unspecified type  G47.00 traZODone  (DESYREL ) 150 MG tablet      Past Psychiatric History: Bipolar d/o Substance induced mood disorder PTSD Anxiety Was being seen at [facility]iat[facility] in [place] for past 4-5 years   Past Medical History:  Past Medical History:  Diagnosis Date   Abnormal Pap smear of cervix    Allergy    Anxiety    Bipolar disorder (HCC)    Cataract    Complication of anesthesia    Degenerative disc disease at L5-S1 level    Depression    GERD (gastroesophageal reflux disease)    Gout    Hyperlipidemia    Hypertension    Overactive bladder    PONV (postoperative nausea and vomiting)     Past Surgical History:  Procedure Laterality Date   ANTERIOR CERVICAL DECOMP/DISCECTOMY FUSION N/A 03/05/2021   Procedure: ACDF - C5-C6 - C6-C7;  Surgeon: Louis Shove, MD;  Location: [facility] OR;  Service: Neurosurgery;  Laterality: N/A;  3C   CARPAL TUNNEL RELEASE Bilateral    CERVIX LESION DESTRUCTION  1985   CHOLECYSTECTOMY     DILATION AND CURETTAGE OF UTERUS     EYE SURGERY Bilateral    cataract removal   TONSILLECTOMY     TUBAL LIGATION      Family Psychiatric History: Mother depression and anxiety in mother, anxiety and depression in her siblings, and Father  had a gambling issue.   Family History:  Family History  Problem Relation Age of Onset   Depression Mother    Anxiety disorder Mother    Asthma Mother    Bipolar disorder Father    Depression Sister    Anxiety disorder Sister    Depression Brother    Anxiety disorder Brother    Depression Brother    Anxiety disorder Brother    Cancer Neg Hx    Diabetes Neg Hx    Heart disease Neg Hx    Colon cancer Neg Hx    Breast cancer Neg Hx    Ovarian cancer Neg Hx    Cervical cancer Neg Hx    Colon polyps Neg Hx     Social History:  Social History   Socioeconomic History   Marital status: Divorced    Spouse  name: Not on file   Number of children: 3   Years of education: Not on file   Highest education level: GED or equivalent  Occupational History   Occupation: Disablilty  Tobacco Use   Smoking status: Former    Current packs/day: 1.00    Average packs/day: 1 pack/day for 48.0 years (48.0 ttl pk-yrs)    Types: Cigarettes    Passive exposure: Current   Smokeless tobacco: Never   Tobacco comments:    Pt smokes 1 pack per day 12/30/21 PAP  Vaping Use   Vaping status: Never Used  Substance and Sexual Activity   Alcohol use: Not Currently   Drug use: Not Currently    Comment: 90 days recovery   Sexual activity: Not Currently  Other Topics Concern   Not on file  Social History Narrative   Not on file   Social Drivers of Health   Financial Resource Strain: Low Risk  (07/08/2020)   Overall Financial Resource Strain (CARDIA)    Difficulty of Paying Living Expenses: Not hard at all  Food Insecurity: No Food Insecurity (08/25/2020)   Hunger Vital Sign    Worried About Running Out of Food in the Last Year: Never true    Ran Out of Food in the Last Year: Never true  Transportation Needs: No Transportation Needs (08/25/2020)   PRAPARE - Administrator, Civil Service (Medical): No    Lack of Transportation (Non-Medical): No  Physical Activity: Sufficiently Active (07/08/2020)   Exercise Vital Sign    Days of Exercise per Week: 7 days    Minutes of Exercise per Session: 30 min  Stress: Stress Concern Present (07/08/2020)   [employer] of Occupational Health - Occupational Stress Questionnaire    Feeling of Stress : Rather much  Social Connections: Moderately Isolated (07/08/2020)   Social Connection and Isolation Panel    Frequency of Communication with Friends and Family: Twice a week    Frequency of Social Gatherings with Friends and Family: Never    Attends Religious Services: 1 to 4 times per year    Active Member of [employer] or Organizations: Yes    Attends Museum/gallery Exhibitions Officer: More than 4 times per year    Marital Status: Divorced    Allergies:  Allergies  Allergen Reactions   Codeine Nausea Only   Diphenhydramine Hcl Other (See Comments)    Pt unsure why   Lithium  Itching   Sulfa Antibiotics Nausea And Vomiting   Amoxicillin  Nausea And Vomiting    Metabolic Disorder Labs: Lab Results  Component Value Date   HGBA1C 5.9 (A) 03/09/2022  No results found for: PROLACTIN Lab Results  Component Value Date   CHOL 172 11/24/2021   TRIG 201.0 (H) 11/24/2021   HDL 39.40 11/24/2021   CHOLHDL 4 11/24/2021   VLDL 40.2 (H) 11/24/2021   LDLCALC 98 04/13/2020   LDLCALC 90 04/01/2019   Lab Results  Component Value Date   TSH 2.610 04/13/2020   TSH 1.720 07/05/2013    Therapeutic Level Labs: Lab Results  Component Value Date   LITHIUM  0.50 (L) 07/10/2013   LITHIUM  <0.25 (L) 07/01/2013   No results found for: VALPROATE Lab Results  Component Value Date   CBMZ 7.6 07/10/2013    Current Medications: Current Outpatient Medications  Medication Sig Dispense Refill   acetaminophen  (TYLENOL ) 500 MG tablet Take 1,000 mg by mouth every 6 (six) hours as needed for mild pain.     allopurinol  (ZYLOPRIM ) 100 MG tablet TAKE 1 TABLET BY MOUTH DAILY 100 tablet 1   amLODipine  (NORVASC ) 5 MG tablet Take 1 tablet (5 mg total) by mouth daily. 90 tablet 1   aspirin  EC 81 MG tablet Take 81 mg by mouth every evening.     atorvastatin  (LIPITOR) 40 MG tablet TAKE 1 TABLET BY MOUTH DAILY AT  6 PM 100 tablet 1   buPROPion  (WELLBUTRIN  XL) 150 MG 24 hr tablet Take 1 tablet (150 mg total) by mouth daily. 30 tablet 3   cyclobenzaprine  (FLEXERIL ) 5 MG tablet Take 1 tablet (5 mg total) by mouth 3 (three) times daily as needed for muscle spasms. 30 tablet 0   FLUoxetine  (PROZAC ) 40 MG capsule Take 2 capsules (80 mg total) by mouth daily. 60 capsule 3   HYDROcodone -acetaminophen  (NORCO/VICODIN) 5-325 MG tablet Take 1 tablet by mouth every 4 (four) hours  as needed for up to 8 doses. (Patient not taking: Reported on 11/29/2023) 8 tablet 0   hydrOXYzine  (VISTARIL ) 50 MG capsule TAKE 1 CAPSULE BY MOUTH EVERY 8  HOURS AS NEEDED FOR ANXIETY 90 capsule 3   lisinopril  (ZESTRIL ) 40 MG tablet TAKE 1 TABLET BY MOUTH DAILY 100 tablet 1   meloxicam  (MOBIC ) 7.5 MG tablet TAKE 1 TABLET(7.5 MG) BY MOUTH DAILY (Patient not taking: Reported on 11/29/2023) 30 tablet 0   methocarbamol  (ROBAXIN ) 500 MG tablet Take 1 tablet (500 mg total) by mouth 2 (two) times daily. (Patient not taking: Reported on 11/29/2023) 20 tablet 0   pantoprazole  (PROTONIX ) 40 MG tablet TAKE 1 TABLET BY MOUTH TWICE  DAILY 200 tablet 1   QUEtiapine  (SEROQUEL  XR) 200 MG 24 hr tablet Take 1 tablet (200 mg total) by mouth at bedtime. 30 tablet 3   traZODone  (DESYREL ) 150 MG tablet Take 1 tablet (150 mg total) by mouth at bedtime as needed for sleep. 30 tablet 3   No current facility-administered medications for this visit.     Musculoskeletal: Strength & Muscle Tone: within normal limits Gait & Station: normal Patient leans: N/A  Psychiatric Specialty Exam: Review of Systems  Blood pressure 103/67, pulse 73, temperature (!) 97.5 F (36.4 C), height 5' 6 (1.676 m), weight 191 lb 3.2 oz (86.7 kg), SpO2 97%.Body mass index is 30.86 kg/m.  General Appearance: Well Groomed  Eye Contact:  Good  Speech:  Clear and Coherent and Normal Rate  Volume:  Normal  Mood:  Euthymic  Affect:  Appropriate and Congruent  Thought Process:  Coherent, Goal Directed, and Linear  Orientation:  Full (Time, Place, and Person)  Thought Content: WDL and Logical   Suicidal Thoughts:  No  Homicidal Thoughts:  No  Memory:  Immediate;   Good Recent;   Good Remote;   Good  Judgement:  Good  Insight:  Good  Psychomotor Activity:  Normal  Concentration:  Concentration: Good and Attention Span: Good  Recall:  Good  Fund of Knowledge: Good  Language: Good  Akathisia:  No  Handed:  Right  AIMS (if indicated):  not done  Assets:  Communication Skills Desire for Improvement Financial Resources/Insurance Housing Intimacy Leisure Time Physical Health Social Support Transportation Vocational/Educational  ADL's:  Intact  Cognition: WNL  Sleep:  Good   Screenings: AIMS    Flowsheet Row Clinical Support from 09/01/2023 in [place]  AIMS Total Score 1   AUDIT    Flowsheet Row [facility]  Alcohol Use Disorder Identification Test Final Score (AUDIT) 0   GAD-7    Flowsheet Row Clinical Support from 12/25/2023 in [facility] Clinical Support from 09/01/2023 in [facility] Office Visit from 08/22/2023 in [facility] [place] [facility] from 03/29/2023 in [facility] Office Visit from 03/02/2023 in [facility] [place]  Total GAD-7 Score 0 19 3 12  0   PHQ2-9    Flowsheet Row Clinical Support from 12/25/2023 in [facility] Clinical Support from 09/01/2023 in [facility] Office Visit from 08/22/2023 in [facility] [place] Counselor from 03/29/2023 in [facility] Office Visit from 03/02/2023 in [facility] [place]  PHQ-2 Total Score 0 2 0 4 0  PHQ-9 Total Score 0 12 4 6 6    Flowsheet Row Clinical Support from 09/01/2023 in [facility] ED from 08/18/2023 in [facility] Emergency Department at [facility] ED from 07/16/2023 in [facility] Emergency Department at [facility]  C-SSRS RISK CATEGORY Moderate Risk No Risk No Risk     Assessment and Plan: Patient reports that she is doing well on her medication regimen.  She does note that she is in need of refills.  Medication refilled and sent to preferred  pharmacy.  1. Cigarette nicotine  dependence without complication  Continue- buPROPion  (WELLBUTRIN  XL) 150 MG 24 hr tablet; Take 1 tablet (150 mg total) by mouth daily.  Dispense: 30 tablet; Refill: 3  2. Anxiety  Continue- QUEtiapine  (SEROQUEL  XR) 200 MG 24 hr tablet; Take 1 tablet (200 mg total) by mouth at bedtime.  Dispense: 30 tablet; Refill: 3 - FLUoxetine  (PROZAC ) 40 MG capsule; Take 2 capsules (80 mg total) by mouth daily.  Dispense: 60 capsule; Refill: 3 Continue- hydrOXYzine  (VISTARIL ) 50 MG capsule; TAKE 1 CAPSULE BY MOUTH EVERY 8  HOURS AS NEEDED FOR ANXIETY  Dispense: 90 capsule; Refill: 3  3. Substance induced mood disorder (HCC)  Continue- QUEtiapine  (SEROQUEL  XR) 200 MG 24 hr tablet; Take 1 tablet (200 mg total) by mouth at bedtime.  Dispense: 30 tablet; Refill: 3 Continue- FLUoxetine  (PROZAC ) 40 MG capsule; Take 2 capsules (80 mg total) by mouth daily.  Dispense: 60 capsule; Refill: 3  4. PTSD (post-traumatic stress disorder)  Continue- FLUoxetine  (PROZAC ) 40 MG capsule; Take 2 capsules (80 mg total) by mouth daily.  Dispense: 60 capsule; Refill: 3 Continue- traZODone  (DESYREL ) 150 MG tablet; Take 1 tablet (150 mg total) by mouth at bedtime as needed for sleep.  Dispense: 30 tablet; Refill: 3  5.  Insomnia, unspecified type  Continue- traZODone  (DESYREL ) 150 MG tablet; Take 1 tablet (150 mg total) by mouth at bedtime as needed for sleep.  Dispense: 30 tablet; Refill: 3   Collaboration of Care: Collaboration of Care: Other provider involved in patient's care AEB PCP and primary psychiatric provider  Patient/Guardian was advised Release of Information must be obtained prior to any record release in order to collaborate their care with an outside provider. Patient/Guardian was advised if they have not already done so to contact the registration department to sign all necessary forms in order for us  to release information regarding their care.   Consent: Patient/Guardian  gives verbal consent for treatment and assignment of benefits for services provided during this visit. Patient/Guardian expressed understanding and agreed to proceed.   Follow-up in 3 months  Zane FORBES Bach, NP 12/25/2023, 8:51 AM

## 2023-12-26 ENCOUNTER — Other Ambulatory Visit: Payer: Self-pay | Admitting: Internal Medicine

## 2023-12-26 DIAGNOSIS — M545 Low back pain, unspecified: Secondary | ICD-10-CM

## 2023-12-26 DIAGNOSIS — M542 Cervicalgia: Secondary | ICD-10-CM

## 2023-12-26 NOTE — Telephone Encounter (Unsigned)
 Copied from CRM #[id number]. Topic: Clinical - Medication Refill >> Dec 26, 2023  8:20 AM Rosina BIRCH wrote: Medication: cyclobenzaprine   Has the patient contacted their pharmacy? No (Agent: If no, request that the patient contact the pharmacy for the refill. If patient does not wish to contact the pharmacy document the reason why and proceed with request.) (Agent: If yes, when and what did the pharmacy advise?)  This is the patient's preferred pharmacy:   [facility] #[id number] [place], [place] - [address] AT [facility] [address] [facility] [address] & [facility] [address] [place] [address] Phone: [phone] Fax: [phone]  Is this the correct pharmacy for this prescription? Yes If no, delete pharmacy and type the correct one.   Has the prescription been filled recently? Yes  Is the patient out of the medication? Yes  Has the patient been seen for an appointment in the last year OR does the patient have an upcoming appointment? Yes  Can we respond through MyChart? Yes  Agent: Please be advised that Rx refills may take up to 3 business days. We ask that you follow-up with your pharmacy.

## 2023-12-27 MED ORDER — CYCLOBENZAPRINE HCL 5 MG PO TABS: 5.0000 mg | ORAL_TABLET | Freq: Three times a day (TID) | ORAL | 0 refills | Status: AC | PRN

## 2024-01-17 ENCOUNTER — Other Ambulatory Visit: Payer: Self-pay | Admitting: Internal Medicine

## 2024-01-17 DIAGNOSIS — K219 Gastro-esophageal reflux disease without esophagitis: Secondary | ICD-10-CM

## 2024-01-23 ENCOUNTER — Ambulatory Visit: Payer: Self-pay

## 2024-01-23 NOTE — Telephone Encounter (Signed)
 FYI Only or Action Required?: FYI only for provider: UC advised .  Patient was last seen in primary care on 11/29/2023 by Theophilus Andrews, Tully GRADE, MD.  Called Nurse Triage reporting Back Pain.  Symptoms began several weeks ago.  Interventions attempted: OTC medications: tylenol  .  Symptoms are: gradually worsening.  Triage Disposition: See HCP Within 4 Hours (Or PCP Triage)  Patient/caregiver understands and will follow disposition?: Yes                       Copied from CRM #[id number]. Topic: Clinical - Red Word Triage >> Jan 23, 2024  9:31 AM Viola F wrote: Red Word that prompted transfer to Nurse Triage: Patient injured back - having pain on right lower back area Reason for Disposition  [1] SEVERE back pain (e.g., excruciating, unable to do any normal activities) AND [2] not improved 2 hours after pain medicine  Answer Assessment - Initial Assessment Questions Started working few weeks ago , causing a lot of pain in back right side lower back. It hurts to move , hurts to stand. Localized just in back. Constant pain 10/10. Nothing makes it better or worse. Using heat. Nothing effective tylenol  eating them like candy  No changes in urination. No appointments today in office this RN advised UC patient is agreeable and going to [facility] on  [address] in [place].      1. ONSET: When did the pain begin? (e.g., minutes, hours, days)     Few weeks since starting working  2. LOCATION: Where does it hurt? (upper, mid or lower back)     Right side lower back  3. SEVERITY: How bad is the pain?  (e.g., Scale 1-10; mild, moderate, or severe)     101/0  4. PATTERN: Is the pain constant? (e.g., yes, no; constant, intermittent)      Constant  5. RADIATION: Does the pain shoot into your legs or somewhere else?     Does not radiate  6. CAUSE:  What do you think is causing the back pain?      Mentions started working few weeks ago  7. BACK OVERUSE:  Any recent  lifting of heavy objects, strenuous work or exercise?     Mentions  recent starting working few week ago  8. MEDICINES: What have you taken so far for the pain? (e.g., nothing, acetaminophen , NSAIDS)     Tylenol  not working  9. NEUROLOGIC SYMPTOMS: Do you have any weakness, numbness, or problems with bowel/bladder control?     Denies  10. OTHER SYMPTOMS: Do you have any other symptoms? (e.g., fever, abdomen pain, burning with urination, blood in urine)       Denies  other symptoms   Patient denies the following chest pain, shortness of breath, fever , dysuria, Syncope , numbness groin, abdominal pain  Protocols used: Back Pain-A-AH

## 2024-02-02 ENCOUNTER — Other Ambulatory Visit (HOSPITAL_COMMUNITY): Payer: Self-pay | Admitting: Psychiatry

## 2024-02-02 DIAGNOSIS — F419 Anxiety disorder, unspecified: Secondary | ICD-10-CM

## 2024-02-02 DIAGNOSIS — G4700 Insomnia, unspecified: Secondary | ICD-10-CM

## 2024-02-02 DIAGNOSIS — F4310 Post-traumatic stress disorder, unspecified: Secondary | ICD-10-CM

## 2024-02-02 DIAGNOSIS — F1994 Other psychoactive substance use, unspecified with psychoactive substance-induced mood disorder: Secondary | ICD-10-CM

## 2024-02-13 ENCOUNTER — Ambulatory Visit

## 2024-02-13 ENCOUNTER — Ambulatory Visit: Admitting: Internal Medicine

## 2024-02-13 ENCOUNTER — Encounter: Payer: Self-pay | Admitting: Internal Medicine

## 2024-02-13 VITALS — BP 140/90 | HR 90 | Temp 97.5°F | Wt 195.1 lb

## 2024-02-13 DIAGNOSIS — M5441 Lumbago with sciatica, right side: Secondary | ICD-10-CM | POA: Diagnosis not present

## 2024-02-13 DIAGNOSIS — G8929 Other chronic pain: Secondary | ICD-10-CM

## 2024-02-13 MED ORDER — MELOXICAM 7.5 MG PO TABS: 7.5000 mg | ORAL_TABLET | Freq: Every day | ORAL | 0 refills | Status: AC

## 2024-02-13 MED ORDER — KETOROLAC TROMETHAMINE 60 MG/2ML IM SOLN
60.0000 mg | Freq: Once | INTRAMUSCULAR | Status: AC
  Administered 2024-02-13: 60 mg via INTRAMUSCULAR

## 2024-02-13 NOTE — Progress Notes (Signed)
 "    Established Patient Office Visit     CC/Reason for Visit: Low back pain  HPI: Marisa Sherman is a 64 y.o. female who is coming in today for the above mentioned reasons.  She has been having lower back pain now for at least 3 to 4 months.  Methocarbamol  provides some relief but is not long-lasting.  She has had Toradol  in the past which has been helpful.  She has not done physical therapy.   Past Medical/Surgical History: Past Medical History:  Diagnosis Date   Abnormal Pap smear of cervix    Allergy    Anxiety    Bipolar disorder (HCC)    Cataract    Complication of anesthesia    Degenerative disc disease at L5-S1 level    Depression    GERD (gastroesophageal reflux disease)    Gout    Hyperlipidemia    Hypertension    Overactive bladder    PONV (postoperative nausea and vomiting)     Past Surgical History:  Procedure Laterality Date   ANTERIOR CERVICAL DECOMP/DISCECTOMY FUSION N/A 03/05/2021   Procedure: ACDF - C5-C6 - C6-C7;  Surgeon: Louis Shove, MD;  Location: [facility];  Service: Neurosurgery;  Laterality: N/A;  3C   CARPAL TUNNEL RELEASE Bilateral    CERVIX LESION DESTRUCTION  1985   CHOLECYSTECTOMY     DILATION AND CURETTAGE OF UTERUS     EYE SURGERY Bilateral    cataract removal   TONSILLECTOMY     TUBAL LIGATION      Social History:  reports that she has quit smoking. Her smoking use included cigarettes. She has a 48 pack-year smoking history. She has been exposed to tobacco smoke. She has never used smokeless tobacco. She reports that she does not currently use alcohol. She reports that she does not currently use drugs.  Allergies: Allergies[1]  Family History:  Family History  Problem Relation Age of Onset   Depression Mother    Anxiety disorder Mother    Asthma Mother    Bipolar disorder Father    Depression Sister    Anxiety disorder Sister    Depression Brother    Anxiety disorder Brother    Depression Brother    Anxiety disorder Brother     Cancer Neg Hx    Diabetes Neg Hx    Heart disease Neg Hx    Colon cancer Neg Hx    Breast cancer Neg Hx    Ovarian cancer Neg Hx    Cervical cancer Neg Hx    Colon polyps Neg Hx     Current Medications[2]  Review of Systems:  Negative unless indicated in HPI.   Physical Exam: Vitals:   02/13/24 1555 02/13/24 1559  BP: (!) 155/90 (!) 140/90  Pulse: 90   Temp: (!) 97.5 F (36.4 C)   TempSrc: Oral   SpO2: 98%   Weight: 195 lb 1.6 oz (88.5 kg)     Body mass index is 31.49 kg/m.     Impression and Plan:  Chronic right-sided low back pain with right-sided sciatica -     Meloxicam ; Take 1 tablet (7.5 mg total) by mouth daily.  Dispense: 30 tablet; Refill: 0 -     Ketorolac  Tromethamine  -     DG Lumbar Spine Complete; Future -     Ambulatory referral to Physical Therapy   - Will give IM Toradol  today and sent home with a prescription for meloxicam . - Referral to physical therapy.  I will do  imaging today as pain has been present for months without much relief.  Time spent:30 minutes reviewing chart, interviewing and examining patient and formulating plan of care.     Tully Theophilus Andrews, MD [facility]     [1]  Allergies Allergen Reactions   Codeine Nausea Only   Diphenhydramine Hcl Other (See Comments)    Pt unsure why   Lithium  Itching   Sulfa Antibiotics Nausea And Vomiting   Amoxicillin  Nausea And Vomiting  [2]  Current Outpatient Medications:    acetaminophen  (TYLENOL ) 500 MG tablet, Take 1,000 mg by mouth every 6 (six) hours as needed for mild pain., Disp: , Rfl:    allopurinol  (ZYLOPRIM ) 100 MG tablet, TAKE 1 TABLET BY MOUTH DAILY, Disp: 100 tablet, Rfl: 1   amLODipine  (NORVASC ) 5 MG tablet, Take 1 tablet (5 mg total) by mouth daily., Disp: 90 tablet, Rfl: 1   aspirin  EC 81 MG tablet, Take 81 mg by mouth every evening., Disp: , Rfl:    atorvastatin  (LIPITOR) 40 MG tablet, TAKE 1 TABLET BY MOUTH DAILY AT  6 PM, Disp: 100  tablet, Rfl: 1   buPROPion  (WELLBUTRIN  XL) 150 MG 24 hr tablet, Take 1 tablet (150 mg total) by mouth daily., Disp: 30 tablet, Rfl: 3   FLUoxetine  (PROZAC ) 40 MG capsule, TAKE 2 CAPSULES BY MOUTH DAILY, Disp: 60 capsule, Rfl: 11   HYDROcodone -acetaminophen  (NORCO/VICODIN) 5-325 MG tablet, Take 1 tablet by mouth every 4 (four) hours as needed for up to 8 doses., Disp: 8 tablet, Rfl: 0   hydrOXYzine  (VISTARIL ) 50 MG capsule, TAKE 1 CAPSULE BY MOUTH EVERY 8  HOURS AS NEEDED FOR ANXIETY, Disp: 90 capsule, Rfl: 3   lisinopril  (ZESTRIL ) 40 MG tablet, TAKE 1 TABLET BY MOUTH DAILY, Disp: 100 tablet, Rfl: 1   methocarbamol  (ROBAXIN ) 500 MG tablet, Take 1 tablet (500 mg total) by mouth 2 (two) times daily., Disp: 20 tablet, Rfl: 0   pantoprazole  (PROTONIX ) 40 MG tablet, TAKE 1 TABLET BY MOUTH TWICE  DAILY, Disp: 200 tablet, Rfl: 2   QUEtiapine  (SEROQUEL  XR) 200 MG 24 hr tablet, TAKE 1 TABLET BY MOUTH AT  BEDTIME, Disp: 30 tablet, Rfl: 11   traZODone  (DESYREL ) 150 MG tablet, TAKE 1 TABLET BY MOUTH AT  BEDTIME AS NEEDED FOR SLEEP, Disp: 30 tablet, Rfl: 11   cyclobenzaprine  (FLEXERIL ) 5 MG tablet, Take 1 tablet (5 mg total) by mouth 3 (three) times daily as needed for muscle spasms. (Patient not taking: Reported on 02/13/2024), Disp: 30 tablet, Rfl: 0   meloxicam  (MOBIC ) 7.5 MG tablet, Take 1 tablet (7.5 mg total) by mouth daily., Disp: 30 tablet, Rfl: 0  Current Facility-Administered Medications:    ketorolac  (TORADOL ) injection 60 mg, 60 mg, Intramuscular, Once,   "

## 2024-02-16 NOTE — Progress Notes (Unsigned)
 [facility] MD/PA/NP OP Progress Note  02/16/2024 8:38 AM Kinda Pottle  MRN:  [id number]  Visit Diagnosis: No diagnosis found.  Assessment:   Calyssa Zobrist presents for follow-up evaluation. Today, 02/16/24, patient reports *** Janelie Goltz is a 64 year old female with a past psychiatric history significant for PTSD, substance-induced mood disorder, insomnia, and anxiety who presents to [facility] to reestablish psychiatric care and for medication management.  Due to her use of Seroquel , an aims assessment was performed with the patient scoring a 1.  Patient reports that she occasionally experiences jerking movements in her hands, especially when holding things.  Patient endorses minimal distress over these movements and denies the need for medication management at this time.   Patient presents to the encounter requesting refills on her current medication regimen.  She reports that she is currently out of her hydroxyzine  prescription.  Patient notes that she has been having issues with sleep and may receive roughly 4 hours of intermittent sleep per night.  Patient reports that her use of Seroquel  has been effective in providing her with sleep.  Provider recommended increasing her Seroquel  dosage from 150 mg to 200 mg at bedtime for the management of her sleep and for mood stability.  Patient was agreeable to recommendation.   Patient reports that she still continues to experience depression characterized by irritable mood.  She also endorses worsening anxiety with no specific triggers.  A PHQ-9 screen was performed with the patient scoring a 12.  A GAD-7 screen was also performed with the patient scoring a 19.  In the past, patient reports that her use of Prozac  was helpful in managing her mood.  Provider recommended increasing her Prozac  dosage from 60 mg to 40 mg 2 times daily for the management of her depressive symptoms and anxiety.  Patient was agreeable to  recommendations.  Patient to continue taking all other medications as prescribed.  Patient's medications to be e-prescribed to pharmacy of choice.  Risk Assessment: An assessment of suicide and violence risk factors was performed as part of this evaluation and is not significantly changed from the last visit. While future psychiatric events cannot be accurately predicted, the patient does not currently require acute inpatient psychiatric care and does not currently meet [place]  involuntary commitment criteria. Patient was given contact information for crisis resources, behavioral health clinic and was instructed to call 911 for emergencies.   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  HPI:  Karrin Eisenmenger is a 64 year old female with a past psychiatric history significant for PTSD, substance-induced mood disorder, insomnia, and anxiety who presents to [facility] to reestablish psychiatric care and for medication management.  Patient was last seen by Reggie WENDI Rice, MD on 02/24/2023.  During her last encounter, patient was being managed on the following psychiatric medications:   Prozac  60 mg daily Hydroxyzine  50 mg 3 times daily as needed Seroquel  XR 150 mg at bedtime Trazodone  150 mg at bedtime as  needed Wellbutrin  XL 150 mg daily   Patient presents to the encounter requesting refills on her medication regimen.  She reports  that she recently ran out of her hydroxyzine  prescription.  She also notes that she has not been sleeping well.  Patient reports that she receives on average 4 hours of sleep per night.  In addition to experiencing decreased sleep, patient reports that she wakes up throughout the night.   In regards to her mood, patient describes her mood as being pissy.  Patient rates her depression a 5 out of 10 with 10 being most severe.  Patient endorses depressive episodes 3 to 4 days/week.  Patient reports that her use of prozac  used to keep her upbeat but now she finds herself being more crabby and bitchy. Patient endorses the following depressive symptoms: feelings of sadness, lack of motivation, decreased energy, irritability, feelings of guilt/worthlessness, and hopelessness.  Patient denies decreased energy.   In addition to depression, patient endorses anxiety and rates her anxiety a 7 out of 10.  Patient denies any specific triggers to her anxiety.  Patient reports that she has been clean off of drugs for 3-1/2 years and reports that she attends NA meetings regularly.  She reports that she recently moved into a new [place] house after not doing well with the residents at her previous [place] house.  Patient reports that she feels at ease at the new [place] house that she is staying in.  A PHQ-9 screen was performed with the patient scoring a 12.  A GAD-7 screen was also performed with the patient scoring a 19.   Patient is alert and oriented x 4, cooperative, and fully engaged in conversation during the encounter.  Patient endorses good mood.  Patient exhibits depressed mood with appropriate affect.  Patient denies suicidal or homicidal ideations.  She further denies auditory or visual hallucinations and does not appear to be responding to internal/external stimuli.  Patient endorses poor sleep and receives on average 4 hours of sleep per night.  Patient endorses fair appetite and eats on average 2 meals per day.   Patient denies alcohol consumption or illicit drug use.  Patient endorses tobacco use and smokes on hours a pack per day.     Past Psychiatric History: Bipolar d/o Substance induced mood disorder PTSD Anxiety Was being seen at [facility] in [place] for past 4-5 ye  Past Medical History:  Past Medical History:  Diagnosis Date   Abnormal Pap smear of cervix    Allergy    Anxiety    Bipolar disorder (HCC)    Cataract    Complication of anesthesia    Degenerative disc disease at L5-S1 level    Depression    GERD (gastroesophageal reflux disease)    Gout    Hyperlipidemia    Hypertension    Overactive bladder    PONV (postoperative nausea and vomiting)     Past Surgical History:  Procedure Laterality Date   ANTERIOR CERVICAL DECOMP/DISCECTOMY FUSION N/A 03/05/2021   Procedure: ACDF - C5-C6 - C6-C7;  Surgeon: Louis Shove, MD;  Location: [facility] OR;  Service: Neurosurgery;  Laterality: N/A;  3C   CARPAL TUNNEL RELEASE Bilateral    CERVIX LESION DESTRUCTION  1985   CHOLECYSTECTOMY     DILATION AND CURETTAGE OF UTERUS     EYE SURGERY Bilateral    cataract removal   TONSILLECTOMY     TUBAL LIGATION      Family Psychiatric History:  History of depression  and anxiety in mother She also informed history of anxiety and depression in her siblings Father had a gambling issue.     Family History:  Family History  Problem Relation Age of Onset   Depression Mother    Anxiety disorder Mother    Asthma Mother    Bipolar disorder Father    Depression Sister    Anxiety disorder Sister    Depression Brother    Anxiety disorder Brother    Depression Brother    Anxiety disorder Brother    Cancer Neg Hx    Diabetes Neg Hx    Heart disease Neg Hx    Colon cancer Neg Hx    Breast cancer Neg Hx    Ovarian cancer Neg Hx    Cervical cancer Neg Hx    Colon polyps Neg Hx     Social History:  Social History   Socioeconomic History   Marital status: Divorced    Spouse  name: Not on file   Number of children: 3   Years of education: Not on file   Highest education level: GED or equivalent  Occupational History   Occupation: Disablilty  Tobacco Use   Smoking status: Former    Current packs/day: 1.00    Average packs/day: 1 pack/day for 48.0 years (48.0 ttl pk-yrs)    Types: Cigarettes    Passive exposure: Current   Smokeless tobacco: Never   Tobacco comments:    Pt smokes 1 pack per day 12/30/21 PAP  Vaping Use   Vaping status: Never Used  Substance and Sexual Activity   Alcohol use: Not Currently   Drug use: Not Currently    Comment: 90 days recovery   Sexual activity: Not Currently  Other Topics Concern   Not on file  Social History Narrative   Not on file   Social Drivers of Health   Tobacco Use: Medium Risk (02/13/2024)   Patient History    Smoking Tobacco Use: Former    Smokeless Tobacco Use: Never    Passive Exposure: Current  Physicist, Medical Strain: Not on file  Food Insecurity: Not on file  Transportation Needs: Not on file  Physical Activity: Not on file  Stress: Not on file  Social Connections: Not on file  Depression (PHQ2-9): Low Risk (12/25/2023)   Depression (PHQ2-9)    PHQ-2 Score: 0  Alcohol Screen: Not on file  Housing: Not on file  Utilities: Not on file  Health Literacy: Not on file    Allergies: Allergies[1]  Current Medications: Current Outpatient Medications  Medication Sig Dispense Refill   acetaminophen  (TYLENOL ) 500 MG tablet Take 1,000 mg by mouth every 6 (six) hours as needed for mild pain.     allopurinol  (ZYLOPRIM ) 100 MG tablet TAKE 1 TABLET BY MOUTH DAILY 100 tablet 1   amLODipine  (NORVASC ) 5 MG tablet Take 1 tablet (5 mg total) by mouth daily. 90 tablet 1   aspirin  EC 81 MG tablet Take 81 mg by mouth every evening.     atorvastatin  (LIPITOR) 40 MG tablet TAKE 1 TABLET BY MOUTH DAILY AT  6 PM 100 tablet 1   buPROPion  (WELLBUTRIN  XL) 150 MG 24 hr tablet Take 1 tablet (150 mg total) by mouth  daily. 30 tablet 3   cyclobenzaprine  (FLEXERIL ) 5 MG tablet Take 1 tablet (5 mg total) by mouth 3 (three) times daily as needed for muscle spasms. (Patient not taking: Reported on 02/13/2024) 30 tablet 0   FLUoxetine  (PROZAC ) 40 MG capsule TAKE 2  CAPSULES BY MOUTH DAILY 60 capsule 11   HYDROcodone -acetaminophen  (NORCO/VICODIN) 5-325 MG tablet Take 1 tablet by mouth every 4 (four) hours as needed for up to 8 doses. 8 tablet 0   hydrOXYzine  (VISTARIL ) 50 MG capsule TAKE 1 CAPSULE BY MOUTH EVERY 8  HOURS AS NEEDED FOR ANXIETY 90 capsule 3   lisinopril  (ZESTRIL ) 40 MG tablet TAKE 1 TABLET BY MOUTH DAILY 100 tablet 1   meloxicam  (MOBIC ) 7.5 MG tablet Take 1 tablet (7.5 mg total) by mouth daily. 30 tablet 0   methocarbamol  (ROBAXIN ) 500 MG tablet Take 1 tablet (500 mg total) by mouth 2 (two) times daily. 20 tablet 0   pantoprazole  (PROTONIX ) 40 MG tablet TAKE 1 TABLET BY MOUTH TWICE  DAILY 200 tablet 2   QUEtiapine  (SEROQUEL  XR) 200 MG 24 hr tablet TAKE 1 TABLET BY MOUTH AT  BEDTIME 30 tablet 11   traZODone  (DESYREL ) 150 MG tablet TAKE 1 TABLET BY MOUTH AT  BEDTIME AS NEEDED FOR SLEEP 30 tablet 11   No current facility-administered medications for this visit.     Musculoskeletal: Strength & Muscle Tone: within normal limits Gait & Station: normal Patient leans: N/A  Psychiatric Specialty Exam: There were no vitals taken for this visit.There is no height or weight on file to calculate BMI. Review of Systems   General Appearance: Neat and Well Groomed  Eye Contact:  Good  Speech:  Clear and Coherent and Normal Rate; no latency on interview today  Volume:  Normal  Mood: Eythymic  Affect:  Euthymic; calm; mildly anxious  Thought Content: Normal  Suicidal Thoughts:  No  Homicidal Thoughts:  No  Thought Process:  Goal Directed and Linear  Orientation:  Full (Time, Place, and Person)    Memory:  Grossly intact   Judgment:  Fair  Insight:  Fair  Concentration:  Concentration: Fair  Recall:   not formally assessed   Fund of Knowledge: Good  Language: Good  Psychomotor Activity:  Normal  Akathisia:  No  AIMS (if indicated): not done  Assets:  Communication Skills Desire for Improvement Housing Physical Health Talents/Skills  ADL's:  Intact  Cognition: WNL  Sleep:  Fair     Metabolic Disorder Labs: Lab Results  Component Value Date   HGBA1C 5.9 (A) 03/09/2022   No results found for: PROLACTIN Lab Results  Component Value Date   CHOL 172 11/24/2021   TRIG 201.0 (H) 11/24/2021   HDL 39.40 11/24/2021   CHOLHDL 4 11/24/2021   VLDL 40.2 (H) 11/24/2021   LDLCALC 98 04/13/2020   LDLCALC 90 04/01/2019   Lab Results  Component Value Date   TSH 2.610 04/13/2020   TSH 1.720 07/05/2013    Therapeutic Level Labs: Lab Results  Component Value Date   LITHIUM  0.50 (L) 07/10/2013   LITHIUM  <0.25 (L) 07/01/2013   No results found for: VALPROATE Lab Results  Component Value Date   CBMZ 7.6 07/10/2013     Screenings: AIMS    Flowsheet Row Clinical Support from 09/01/2023 in [facility]  AIMS Total Score 1   AUDIT    Flowsheet Row [facility]  Alcohol Use Disorder Identification Test Final Score (AUDIT) 0   GAD-7    Flowsheet Row Clinical Support from 12/25/2023 in [facility] Clinical Support from 09/01/2023 in [facility] Office Visit from 08/22/2023 in [facility] [place] Counselor from 03/29/2023 in  [facility] Office Visit from 03/02/2023 in [facility] [place]  Total GAD-7 Score 0 19 3 12  0   PHQ2-9    Flowsheet Row Clinical Support from 12/25/2023 in [facility] Clinical Support from 09/01/2023 in [facility] Office Visit from 08/22/2023 in [facility] [place] Counselor from 03/29/2023 in [facility] Office Visit from 03/02/2023 in [facility] [place]  PHQ-2 Total Score 0 2 0 4 0  PHQ-9 Total Score 0 12 4 6 6    Flowsheet Row Clinical Support from 09/01/2023 in [facility] ED from 08/18/2023 in [facility] Emergency Department at [facility] ED from 07/16/2023 in [facility] Emergency Department at [facility]  C-SSRS RISK CATEGORY Moderate Risk No Risk No Risk    Collaboration of Care: Collaboration of Care: Medication Management AEB ***  Patient/Guardian was advised Release of Information must be obtained prior to any record release in order to collaborate their care with an outside provider. Patient/Guardian was advised if they have not already done so to contact the registration department to sign all necessary forms in order for us  to release information regarding their care.   Consent: Patient/Guardian gives verbal consent for treatment and assignment of benefits for services provided during this visit. Patient/Guardian expressed understanding and agreed to proceed.    Misheel Gowans, MD 02/16/2024, 8:38 AM    [1]  Allergies Allergen Reactions   Codeine Nausea Only   Diphenhydramine Hcl Other (See Comments)    Pt unsure why   Lithium  Itching   Sulfa Antibiotics Nausea And Vomiting   Amoxicillin  Nausea And Vomiting
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
## 2024-02-21 ENCOUNTER — Telehealth: Payer: Self-pay | Admitting: Rehabilitative and Restorative Service Providers"

## 2024-02-21 ENCOUNTER — Ambulatory Visit: Attending: Internal Medicine | Admitting: Rehabilitative and Restorative Service Providers"

## 2024-02-21 NOTE — Telephone Encounter (Signed)
 Called patient regarding missed PT Evaluation on 02/21/2024 at 9:30 AM.  Left a voicemail to request that she call the office back to reschedule her appointment.

## 2024-02-27 ENCOUNTER — Ambulatory Visit: Payer: Self-pay | Admitting: Internal Medicine

## 2024-02-29 ENCOUNTER — Encounter (HOSPITAL_COMMUNITY)

## 2024-03-01 ENCOUNTER — Other Ambulatory Visit (HOSPITAL_COMMUNITY): Payer: Self-pay | Admitting: Psychiatry

## 2024-03-01 DIAGNOSIS — F17210 Nicotine dependence, cigarettes, uncomplicated: Secondary | ICD-10-CM
